# Patient Record
Sex: Female | Born: 1955 | Race: Black or African American | Hispanic: No | Marital: Married | Smoking: Never smoker
Health system: Southern US, Community
[De-identification: ages and names within clinical notes are randomized; demographics above are authoritative.]

## PROBLEM LIST (undated history)

## (undated) DIAGNOSIS — M1990 Unspecified osteoarthritis, unspecified site: Secondary | ICD-10-CM

## (undated) DIAGNOSIS — F329 Major depressive disorder, single episode, unspecified: Secondary | ICD-10-CM

## (undated) DIAGNOSIS — F32A Depression, unspecified: Secondary | ICD-10-CM

## (undated) DIAGNOSIS — L03115 Cellulitis of right lower limb: Secondary | ICD-10-CM

## (undated) DIAGNOSIS — Z8489 Family history of other specified conditions: Secondary | ICD-10-CM

## (undated) DIAGNOSIS — I10 Essential (primary) hypertension: Secondary | ICD-10-CM

## (undated) DIAGNOSIS — E7800 Pure hypercholesterolemia, unspecified: Secondary | ICD-10-CM

## (undated) DIAGNOSIS — F419 Anxiety disorder, unspecified: Secondary | ICD-10-CM

## (undated) HISTORY — PX: ABDOMINAL HYSTERECTOMY: SHX81

---
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
## 2003-09-16 ENCOUNTER — Other Ambulatory Visit: Payer: Self-pay

## 2005-01-14 ENCOUNTER — Emergency Department (HOSPITAL_COMMUNITY): Admission: EM | Admit: 2005-01-14 | Discharge: 2005-01-14 | Payer: Self-pay | Admitting: Emergency Medicine

## 2013-11-15 ENCOUNTER — Emergency Department: Payer: Self-pay | Admitting: Emergency Medicine

## 2015-05-12 ENCOUNTER — Telehealth: Payer: Self-pay | Admitting: Gastroenterology

## 2015-05-12 NOTE — Telephone Encounter (Signed)
colonoscopy

## 2015-06-06 ENCOUNTER — Other Ambulatory Visit: Payer: Self-pay

## 2015-06-06 ENCOUNTER — Telehealth: Payer: Self-pay

## 2015-06-06 NOTE — Telephone Encounter (Signed)
Gastroenterology Pre-Procedure Review  Request Date: 06/27/15 Requesting Physician: Dr. Georga Bora  PATIENT REVIEW QUESTIONS: The patient responded to the following health history questions as indicated:    1. Are you having any GI issues? no 2. Do you have a personal history of Polyps? no 3. Do you have a family history of Colon Cancer or Polyps? no 4. Diabetes Mellitus? no 5. Joint replacements in the past 12 months?no 6. Major health problems in the past 3 months?no 7. Any artificial heart valves, MVP, or defibrillator?no    MEDICATIONS & ALLERGIES:    Patient reports the following regarding taking any anticoagulation/antiplatelet therapy:   Plavix, Coumadin, Eliquis, Xarelto, Lovenox, Pradaxa, Brilinta, or Effient? no Aspirin? no  Patient confirms/reports the following medications:  Current Outpatient Prescriptions  Medication Sig Dispense Refill  . estradiol (ESTRACE) 1 MG tablet Take by mouth.    Marland Kitchen lisinopril-hydrochlorothiazide (PRINZIDE,ZESTORETIC) 20-12.5 MG tablet Take by mouth.    . lovastatin (MEVACOR) 40 MG tablet Take by mouth.    . medroxyPROGESTERone (PROVERA) 10 MG tablet Take by mouth.    . traZODone (DESYREL) 50 MG tablet Take by mouth.    . triamcinolone cream (KENALOG) 0.1 % Apply topically.     No current facility-administered medications for this visit.    Patient confirms/reports the following allergies:  No Known Allergies  No orders of the defined types were placed in this encounter.    AUTHORIZATION INFORMATION Primary Insurance: 1D#: Group #:  Secondary Insurance: 1D#: Group #:  SCHEDULE INFORMATION: Date: 06/27/15 Location: [place]

## 2015-06-06 NOTE — Telephone Encounter (Signed)
LVM for pt to return my call.

## 2015-06-06 NOTE — Telephone Encounter (Signed)
Pt scheduled for screening colonoscopy at [facility] on 06/27/15. Please precert

## 2015-06-21 ENCOUNTER — Emergency Department
Admission: EM | Admit: 2015-06-21 | Discharge: 2015-06-21 | Disposition: A | Discharge status: Home / Self Care | Payer: BLUE CROSS/BLUE SHIELD | Attending: Emergency Medicine | Admitting: Emergency Medicine

## 2015-06-21 DIAGNOSIS — Z79899 Other long term (current) drug therapy: Secondary | ICD-10-CM | POA: Diagnosis not present

## 2015-06-21 DIAGNOSIS — I10 Essential (primary) hypertension: Secondary | ICD-10-CM | POA: Diagnosis not present

## 2015-06-21 DIAGNOSIS — M1990 Unspecified osteoarthritis, unspecified site: Secondary | ICD-10-CM | POA: Insufficient documentation

## 2015-06-21 DIAGNOSIS — L03115 Cellulitis of right lower limb: Secondary | ICD-10-CM | POA: Diagnosis not present

## 2015-06-21 DIAGNOSIS — L02415 Cutaneous abscess of right lower limb: Secondary | ICD-10-CM | POA: Diagnosis present

## 2015-06-21 MED ORDER — AMOXICILLIN 500 MG PO TABS: 500.0000 mg | ORAL_TABLET | Freq: Three times a day (TID) | ORAL | Status: DC

## 2015-06-21 MED ORDER — SULFAMETHOXAZOLE-TRIMETHOPRIM 800-160 MG PO TABS
1.0000 | ORAL_TABLET | Freq: Once | ORAL | Status: AC
  Administered 2015-06-21: 1 via ORAL
  Filled 2015-06-21: qty 1

## 2015-06-21 MED ORDER — LIDOCAINE HCL (PF) 1 % IJ SOLN
INTRAMUSCULAR | Status: AC
  Filled 2015-06-21: qty 5

## 2015-06-21 MED ORDER — LIDOCAINE HCL (PF) 1 % IJ SOLN
2.1000 mL | Freq: Once | INTRAMUSCULAR | Status: AC
  Administered 2015-06-21: 2.1 mL

## 2015-06-21 MED ORDER — CEFTRIAXONE SODIUM 1 G IJ SOLR
1.0000 g | Freq: Once | INTRAMUSCULAR | Status: AC
  Administered 2015-06-21: 1 g via INTRAMUSCULAR
  Filled 2015-06-21: qty 10

## 2015-06-21 MED ORDER — SULFAMETHOXAZOLE-TRIMETHOPRIM 800-160 MG PO TABS: 1.0000 | ORAL_TABLET | Freq: Two times a day (BID) | ORAL | Status: DC

## 2015-06-21 NOTE — ED Provider Notes (Signed)
[facility] Emergency Department Provider Note  ____________________________________________  Time seen: Approximately 9:19 PM  I have reviewed the triage vital signs and the nursing notes.   HISTORY  Chief Complaint Abscess    HPI Kim Gomez is a 60 y.o. female who had a red spot on her right knee yesterday. Mild swelling last night. Worsening redness and swelling today with tenderness. No known injury. Had small amount of drainage yesterday. None today.   No past medical history on file.  There are no active problems to display for this patient.   No past surgical history on file.  Current Outpatient Rx  Name  Route  Sig  Dispense  Refill  . amoxicillin (AMOXIL) 500 MG tablet   Oral   Take 1 tablet (500 mg total) by mouth 3 (three) times daily.   21 tablet   0   . estradiol (ESTRACE) 1 MG tablet   Oral   Take by mouth.         Marland Kitchen lisinopril-hydrochlorothiazide (PRINZIDE,ZESTORETIC) 20-12.5 MG tablet   Oral   Take by mouth.         . lovastatin (MEVACOR) 40 MG tablet   Oral   Take by mouth.         . medroxyPROGESTERone (PROVERA) 10 MG tablet   Oral   Take by mouth.         . sulfamethoxazole-trimethoprim (BACTRIM DS,SEPTRA DS) 800-160 MG tablet   Oral   Take 1 tablet by mouth 2 (two) times daily.   14 tablet   0   . traZODone (DESYREL) 50 MG tablet   Oral   Take by mouth.         . triamcinolone cream (KENALOG) 0.1 %   Topical   Apply topically.           Allergies Review of patient's allergies indicates no known allergies.  No family history on file.  Social History Social History  Substance Use Topics  . Smoking status: Not on file  . Smokeless tobacco: Not on file  . Alcohol Use: Not on file    Review of Systems Constitutional: No fever/chills Eyes: No visual changes. ENT: No sore throat. Cardiovascular: Denies chest pain. Respiratory: Denies shortness of breath. Gastrointestinal: No  abdominal pain.  No nausea, no vomiting.  Musculoskeletal: Negative for back pain. Skin: Per history of present illness Neurological: Negative for headaches, focal weakness or numbness. 10-point ROS otherwise negative.  ____________________________________________   PHYSICAL EXAM:  VITAL SIGNS: ED Triage Vitals  Enc Vitals Group     BP 06/21/15 2048 142/62 mmHg     Pulse Rate 06/21/15 2048 84     Resp 06/21/15 2048 18     Temp 06/21/15 2048 98.4 F (36.9 C)     Temp Source 06/21/15 2048 Oral     SpO2 06/21/15 2048 98 %     Weight 06/21/15 2048 173 lb (78.472 kg)     Height 06/21/15 2048 5\' 4"  (1.626 m)     Head Cir --      Peak Flow --      Pain Score 06/21/15 2049 6     Pain Loc --      Pain Edu? --      Excl. in [place]? --     Constitutional: Alert and oriented. Well appearing and in no acute distress. Eyes: Conjunctivae are normal. PERRL. EOMI. Ears:  Clear with normal landmarks. No erythema. Head: Atraumatic. Nose: No congestion/rhinnorhea. Mouth/Throat: Mucous  membranes are moist.  Oropharynx non-erythematous. No lesions. Neck:  Supple.  No adenopathy.   Cardiovascular: Normal rate, regular rhythm. Grossly normal heart sounds.  Good peripheral circulation. Respiratory: Normal respiratory effort.  No retractions. Lungs CTAB. Gastrointestinal: Soft and nontender. No distention. No abdominal bruits. No CVA tenderness. Musculoskeletal: Nml ROM of upper and lower extremity joints. Neurologic:  Normal speech and language. No gross focal neurologic deficits are appreciated. No gait instability. Skin: Warm erythematous region around the right knee without focal induration, pointing or fluctuance. Psychiatric: Mood and affect are normal. Speech and behavior are normal.  ____________________________________________   LABS (all labs ordered are listed, but only abnormal results are displayed)  Labs Reviewed - No data to  display ____________________________________________  EKG   ____________________________________________  RADIOLOGY   ____________________________________________   PROCEDURES  Procedure(s) performed: None  Critical Care performed: No  ____________________________________________   INITIAL IMPRESSION / ASSESSMENT AND PLAN / ED COURSE  Pertinent labs & imaging results that were available during my care of the patient were reviewed by me and considered in my medical decision making (see chart for details).  60 year old with increasing redness, swelling, tenderness and warmth to the right knee. She has normal range of motion and symptoms are concerning for cellulitis. No focal abscess noted. She is given Rocephin 1 g IM to cover for MSSA, but also Bactrim 1 to cover for MRSA. She is sent home on amoxicillin and Bactrim and encouraged close follow-up, either here or her primary physician in the next 1-2 days. Discussed worsening symptoms for which she should return to the emergency room such as increasing redness, fevers and chills, increasing pain. ____________________________________________   FINAL CLINICAL IMPRESSION(S) / ED DIAGNOSES  Final diagnoses:  Cellulitis of right lower extremity      Mortimer Fries, PA-C 06/21/15 2123  Schuyler Amor, MD 06/21/15 2304

## 2015-06-21 NOTE — ED Notes (Signed)
Pt c/o abscess to right knee. Pt reports noticing painful red area last night, and pain/swelling has increased since. Area is warm to touch

## 2015-06-21 NOTE — Discharge Instructions (Signed)
Cellulitis Cellulitis is an infection of the skin and the tissue beneath it. The infected area is usually red and tender. Cellulitis occurs most often in the arms and lower legs.  CAUSES  Cellulitis is caused by bacteria that enter the skin through cracks or cuts in the skin. The most common types of bacteria that cause cellulitis are staphylococci and streptococci. SIGNS AND SYMPTOMS   Redness and warmth.  Swelling.  Tenderness or pain.  Fever. DIAGNOSIS  Your health care provider can usually determine what is wrong based on a physical exam. Blood tests may also be done. TREATMENT  Treatment usually involves taking an antibiotic medicine. HOME CARE INSTRUCTIONS   Take your antibiotic medicine as directed by your health care provider. Finish the antibiotic even if you start to feel better.  Keep the infected arm or leg elevated to reduce swelling.  Apply a warm cloth to the affected area up to 4 times per day to relieve pain.  Take medicines only as directed by your health care provider.  Keep all follow-up visits as directed by your health care provider. SEEK MEDICAL CARE IF:   You notice red streaks coming from the infected area.  Your red area gets larger or turns dark in color.  Your bone or joint underneath the infected area becomes painful after the skin has healed.  Your infection returns in the same area or another area.  You notice a swollen bump in the infected area.  You develop new symptoms.  You have a fever. SEEK IMMEDIATE MEDICAL CARE IF:   You feel very sleepy.  You develop vomiting or diarrhea.  You have a general ill feeling (malaise) with muscle aches and pains.   This information is not intended to replace advice given to you by your health care provider. Make sure you discuss any questions you have with your health care provider.   Document Released: 10/14/2004 Document Revised: 09/25/2014 Document Reviewed: 03/22/2011 Elsevier Interactive  Patient Education 2016 [employer].   Take antibiotics as directed. Follow-up with your physician in one to 2 days for further evaluation. Return to the emergency room for any worsening symptoms.

## 2015-06-21 NOTE — ED Notes (Signed)
E sig pad not working, pt verbalized understanding 

## 2015-06-21 NOTE — ED Notes (Signed)
Pt ambulatory to triage with no difficulty. Pt reports yesterday in the morning she noticed an area to her right knee that looked like a bite and was itching. Pt reports she scratched the area and noticed some purulent drainage from the area. Pt now has redness, warmth and swelling to her right knee. Pt denies pain to the joint. Pt denies injury.

## 2015-06-23 ENCOUNTER — Telehealth: Payer: Self-pay | Admitting: Gastroenterology

## 2015-06-23 ENCOUNTER — Other Ambulatory Visit: Payer: Self-pay

## 2015-06-23 ENCOUNTER — Encounter: Payer: Self-pay | Admitting: *Deleted

## 2015-06-23 DIAGNOSIS — Z1211 Encounter for screening for malignant neoplasm of colon: Secondary | ICD-10-CM

## 2015-06-23 MED ORDER — PEG 3350-KCL-NABCB-NACL-NASULF 236 G PO SOLR: 4000.0000 mL | Freq: Once | ORAL | Status: DC

## 2015-06-23 NOTE — Telephone Encounter (Signed)
Pt had not received instructions that I had mailed to her. Rx was sent to [place], [employer] and paperwork was emailed.

## 2015-06-23 NOTE — Telephone Encounter (Signed)
Patient called and stated that her colonoscopy is Friday and she didn't get her RX for her prep yet. Please call.

## 2015-06-24 NOTE — Telephone Encounter (Signed)
All pt's questions answered regarding prep instructions.

## 2015-06-24 NOTE — Discharge Instructions (Signed)

## 2015-06-27 ENCOUNTER — Ambulatory Visit: Payer: BLUE CROSS/BLUE SHIELD | Admitting: Anesthesiology

## 2015-06-27 ENCOUNTER — Encounter
Admission: RE | Disposition: A | Discharge status: Home / Self Care | Payer: Self-pay | Source: Ambulatory Visit | Attending: Gastroenterology

## 2015-06-27 ENCOUNTER — Ambulatory Visit
Admission: RE | Admit: 2015-06-27 | Discharge: 2015-06-27 | Disposition: A | Discharge status: Home / Self Care | Payer: BLUE CROSS/BLUE SHIELD | Source: Ambulatory Visit | Attending: Gastroenterology | Admitting: Gastroenterology

## 2015-06-27 DIAGNOSIS — D122 Benign neoplasm of ascending colon: Secondary | ICD-10-CM

## 2015-06-27 DIAGNOSIS — K5730 Diverticulosis of large intestine without perforation or abscess without bleeding: Secondary | ICD-10-CM | POA: Diagnosis not present

## 2015-06-27 DIAGNOSIS — I10 Essential (primary) hypertension: Secondary | ICD-10-CM | POA: Insufficient documentation

## 2015-06-27 DIAGNOSIS — E7800 Pure hypercholesterolemia, unspecified: Secondary | ICD-10-CM | POA: Diagnosis not present

## 2015-06-27 DIAGNOSIS — Z79899 Other long term (current) drug therapy: Secondary | ICD-10-CM | POA: Insufficient documentation

## 2015-06-27 DIAGNOSIS — D124 Benign neoplasm of descending colon: Secondary | ICD-10-CM | POA: Insufficient documentation

## 2015-06-27 DIAGNOSIS — Z1211 Encounter for screening for malignant neoplasm of colon: Secondary | ICD-10-CM | POA: Insufficient documentation

## 2015-06-27 DIAGNOSIS — M19042 Primary osteoarthritis, left hand: Secondary | ICD-10-CM | POA: Diagnosis not present

## 2015-06-27 DIAGNOSIS — M19041 Primary osteoarthritis, right hand: Secondary | ICD-10-CM | POA: Insufficient documentation

## 2015-06-27 DIAGNOSIS — M170 Bilateral primary osteoarthritis of knee: Secondary | ICD-10-CM | POA: Diagnosis not present

## 2015-06-27 DIAGNOSIS — K641 Second degree hemorrhoids: Secondary | ICD-10-CM | POA: Diagnosis not present

## 2015-06-27 HISTORY — DX: Essential (primary) hypertension: I10

## 2015-06-27 HISTORY — DX: Pure hypercholesterolemia, unspecified: E78.00

## 2015-06-27 HISTORY — PX: COLONOSCOPY WITH PROPOFOL: SHX5780

## 2015-06-27 HISTORY — PX: POLYPECTOMY: SHX5525

## 2015-06-27 HISTORY — DX: Family history of other specified conditions: Z84.89

## 2015-06-27 HISTORY — DX: Cellulitis of right lower limb: L03.115

## 2015-06-27 HISTORY — DX: Unspecified osteoarthritis, unspecified site: M19.90

## 2015-06-27 SURGERY — COLONOSCOPY WITH PROPOFOL
Anesthesia: Monitor Anesthesia Care

## 2015-06-27 MED ORDER — PROPOFOL 10 MG/ML IV BOLUS
INTRAVENOUS | Status: DC | PRN
  Administered 2015-06-27: 40 mg via INTRAVENOUS
  Administered 2015-06-27: 20 mg via INTRAVENOUS
  Administered 2015-06-27: 40 mg via INTRAVENOUS
  Administered 2015-06-27: 100 mg via INTRAVENOUS

## 2015-06-27 MED ORDER — LACTATED RINGERS IV SOLN
INTRAVENOUS | Status: DC
  Administered 2015-06-27: 09:00:00 via INTRAVENOUS

## 2015-06-27 MED ORDER — STERILE WATER FOR IRRIGATION IR SOLN
Status: DC | PRN
  Administered 2015-06-27: 10:00:00

## 2015-06-27 MED ORDER — LIDOCAINE HCL (CARDIAC) 20 MG/ML IV SOLN
INTRAVENOUS | Status: DC | PRN
  Administered 2015-06-27: 40 mg via INTRAVENOUS

## 2015-06-27 SURGICAL SUPPLY — 22 items
CANISTER SUCT 1200ML W/VALVE (MISCELLANEOUS) ×3 IMPLANT
CLIP HMST 235XBRD CATH ROT (MISCELLANEOUS) IMPLANT
CLIP RESOLUTION 360 11X235 (MISCELLANEOUS)
FCP ESCP3.2XJMB 240X2.8X (MISCELLANEOUS)
FORCEPS BIOP RAD 4 LRG CAP 4 (CUTTING FORCEPS) IMPLANT
FORCEPS BIOP RJ4 240 W/NDL (MISCELLANEOUS)
FORCEPS ESCP3.2XJMB 240X2.8X (MISCELLANEOUS) IMPLANT
GOWN CVR UNV OPN BCK APRN NK (MISCELLANEOUS) ×4 IMPLANT
GOWN ISOL THUMB LOOP REG UNIV (MISCELLANEOUS) ×6
INJECTOR VARIJECT VIN23 (MISCELLANEOUS) IMPLANT
KIT DEFENDO VALVE AND CONN (KITS) IMPLANT
KIT ENDO PROCEDURE OLY (KITS) ×3 IMPLANT
MARKER SPOT ENDO TATTOO 5ML (MISCELLANEOUS) IMPLANT
PAD GROUND ADULT SPLIT (MISCELLANEOUS) IMPLANT
PROBE APC STR FIRE (PROBE) IMPLANT
SNARE SHORT THROW 13M SML OVAL (MISCELLANEOUS) ×1 IMPLANT
SNARE SHORT THROW 30M LRG OVAL (MISCELLANEOUS) IMPLANT
SNARE SNG USE RND 15MM (INSTRUMENTS) IMPLANT
SPOT EX ENDOSCOPIC TATTOO (MISCELLANEOUS)
TRAP ETRAP POLY (MISCELLANEOUS) ×1 IMPLANT
VARIJECT INJECTOR VIN23 (MISCELLANEOUS)
WATER STERILE IRR 250ML POUR (IV SOLUTION) ×3 IMPLANT

## 2015-06-27 NOTE — Transfer of Care (Signed)
Immediate Anesthesia Transfer of Care Note  Patient: Kim Gomez  Procedure(s) Performed: Procedure(s): COLONOSCOPY WITH PROPOFOL (N/A) POLYPECTOMY  Patient Location: PACU  Anesthesia Type: MAC  Level of Consciousness: awake, alert  and patient cooperative  Airway and Oxygen Therapy: Patient Spontanous Breathing and Patient connected to supplemental oxygen  Post-op Assessment: Post-op Vital signs reviewed, Patient's Cardiovascular Status Stable, Respiratory Function Stable, Patent Airway and No signs of Nausea or vomiting  Post-op Vital Signs: Reviewed and stable  Complications: No apparent anesthesia complications

## 2015-06-27 NOTE — Anesthesia Procedure Notes (Signed)
Procedure Name: MAC Performed by: Loretta Kluender Pre-anesthesia Checklist: Patient identified, Emergency Drugs available, Suction available, Patient being monitored and Timeout performed Patient Re-evaluated:Patient Re-evaluated prior to inductionOxygen Delivery Method: Nasal cannula       

## 2015-06-27 NOTE — Anesthesia Preprocedure Evaluation (Addendum)
Anesthesia Evaluation  Patient identified by MRN, date of birth, ID band  Reviewed: Allergy & Precautions, H&P , NPO status , Patient's Chart, lab work & pertinent test results  History of Anesthesia Complications Negative for: history of anesthetic complications  Airway Mallampati: II  TM Distance: >3 FB Neck ROM: full    Dental no notable dental hx.    Pulmonary neg pulmonary ROS,    Pulmonary exam normal        Cardiovascular hypertension, On Medications Normal cardiovascular exam     Neuro/Psych    GI/Hepatic negative GI ROS, Neg liver ROS,   Endo/Other  negative endocrine ROS  Renal/GU negative Renal ROS     Musculoskeletal   Abdominal   Peds  Hematology negative hematology ROS (+)   Anesthesia Other Findings   Reproductive/Obstetrics                            Anesthesia Physical Anesthesia Plan  ASA: II  Anesthesia Plan: MAC   Post-op Pain Management:    Induction:   Airway Management Planned:   Additional Equipment:   Intra-op Plan:   Post-operative Plan:   Informed Consent: I have reviewed the patients History and Physical, chart, labs and discussed the procedure including the risks, benefits and alternatives for the proposed anesthesia with the patient or authorized representative who has indicated his/her understanding and acceptance.     Plan Discussed with: CRNA  Anesthesia Plan Comments:         Anesthesia Quick Evaluation

## 2015-06-27 NOTE — Anesthesia Postprocedure Evaluation (Signed)
Anesthesia Post Note  Patient: Kim Gomez  Procedure(s) Performed: Procedure(s) (LRB): COLONOSCOPY WITH PROPOFOL (N/A) POLYPECTOMY  Patient location during evaluation: PACU Anesthesia Type: MAC Level of consciousness: awake and alert Pain management: pain level controlled Vital Signs Assessment: post-procedure vital signs reviewed and stable Respiratory status: spontaneous breathing and respiratory function stable Cardiovascular status: stable Postop Assessment: no headache Anesthetic complications: no    Jaci Standard, III,  Elisa Kutner D

## 2015-06-27 NOTE — Op Note (Signed)
[facility] Gastroenterology Patient Name: Kim Gomez Procedure Date: 06/27/2015 9:53 AM MRN: [id number] Account #: [id number] Date of Birth: [birth year] Admit Type: Outpatient Age: 60 Room: [facility] OR ROOM 01 Gender: Female Note Status: Finalized Procedure:            Colonoscopy Indications:          Screening for colorectal malignant neoplasm Providers:            Lucilla Lame, MD Referring MD:         Shelby Mattocks. Georga Bora, MD (Referring MD) Medicines:            Propofol per Anesthesia Complications:        No immediate complications. Procedure:            Pre-Anesthesia Assessment:                       - Prior to the procedure, a History and Physical was                        performed, and patient medications and allergies were                        reviewed. The patient's tolerance of previous                        anesthesia was also reviewed. The risks and benefits of                        the procedure and the sedation options and risks were                        discussed with the patient. All questions were                        answered, and informed consent was obtained. Prior                        Anticoagulants: The patient has taken no previous                        anticoagulant or antiplatelet agents. ASA Grade                        Assessment: II - A patient with mild systemic disease.                        After reviewing the risks and benefits, the patient was                        deemed in satisfactory condition to undergo the                        procedure.                       After obtaining informed consent, the colonoscope was                        passed under direct vision. Throughout the procedure,  the patient's blood pressure, pulse, and oxygen                        saturations were monitored continuously. The Olympus CF                        H180AL colonoscope (S#: [id number]) was introduced  through                        the anus and advanced to the the cecum, identified by                        appendiceal orifice and ileocecal valve. The                        colonoscopy was performed without difficulty. The                        patient tolerated the procedure well. The quality of                        the bowel preparation was good. Findings:      The perianal and digital rectal examinations were normal.      Two sessile polyps were found in the ascending colon. The polyps were 3       to 4 mm in size. These polyps were removed with a cold snare. Resection       and retrieval were complete.      A 3 mm polyp was found in the descending colon. The polyp was sessile.       The polyp was removed with a cold snare. Resection and retrieval were       complete.      Multiple small-mouthed diverticula were found in the entire colon.      Non-bleeding internal hemorrhoids were found during retroflexion. The       hemorrhoids were Grade II (internal hemorrhoids that prolapse but reduce       spontaneously). Impression:           - Two 3 to 4 mm polyps in the ascending colon, removed                        with a cold snare. Resected and retrieved.                       - One 3 mm polyp in the descending colon, removed with                        a cold snare. Resected and retrieved.                       - Diverticulosis in the entire examined colon.                       - Non-bleeding internal hemorrhoids. Recommendation:       - Repeat colonoscopy in 5 years if polyp adenoma and 10                        years if hyperplastic Procedure Code(s):    --- Professional ---  45385, Colonoscopy, flexible; with removal of tumor(s),                        polyp(s), or other lesion(s) by snare technique Diagnosis Code(s):    --- Professional ---                       Z12.11, Encounter for screening for malignant neoplasm                        of colon                        D12.2, Benign neoplasm of ascending colon                       D12.4, Benign neoplasm of descending colon CPT copyright 2016 American Medical Association. All rights reserved. The codes documented in this report are preliminary and upon coder review may  be revised to meet current compliance requirements. Lucilla Lame, MD 06/27/2015 10:09:45 AM This report has been signed electronically. Number of Addenda: 0 Note Initiated On: 06/27/2015 9:53 AM Scope Withdrawal Time: 0 hours 7 minutes 36 seconds  Total Procedure Duration: 0 hours 11 minutes 26 seconds       [facility]

## 2015-06-27 NOTE — H&P (Signed)
Kim Lame, MD [facility] [address] [place], [place] [address] Phone: [phone] Fax : [phone]  Primary Care Physician:  No primary care provider on file. Primary Gastroenterologist:  Dr. Allen Norris  Pre-Procedure History & Physical: HPI:  Kim Gomez is a 60 y.o. female is here for a screening colonoscopy.   Past Medical History  Diagnosis Date  . Arthritis     legs, hands  . Hypertension   . Family history of adverse reaction to anesthesia     sister and mom -PONV  . Hypercholesteremia   . Cellulitis of right knee     started on antibiotics 06/21/15.      Past Surgical History  Procedure Laterality Date  . Abdominal hysterectomy      Prior to Admission medications   Medication Sig Start Date End Date Taking? Authorizing Provider  amoxicillin (AMOXIL) 500 MG tablet Take 1 tablet (500 mg total) by mouth 3 (three) times daily. 06/21/15  Yes Mortimer Fries, PA-C  Calcium Carb-Cholecalciferol (CALCIUM 500 +D PO) Take by mouth daily.   Yes Historical Provider, MD  estradiol (ESTRACE) 1 MG tablet Take by mouth.   Yes Historical Provider, MD  lisinopril-hydrochlorothiazide (PRINZIDE,ZESTORETIC) 20-12.5 MG tablet Take by mouth.   Yes Historical Provider, MD  lovastatin (MEVACOR) 40 MG tablet Take by mouth.   Yes Historical Provider, MD  medroxyPROGESTERone (PROVERA) 10 MG tablet Take by mouth.   Yes Historical Provider, MD  Prenatal Vit-Fe Fumarate-FA (PRENATAL VITAMIN PLUS LOW IRON PO) Take by mouth daily.   Yes Historical Provider, MD  sulfamethoxazole-trimethoprim (BACTRIM DS,SEPTRA DS) 800-160 MG tablet Take 1 tablet by mouth 2 (two) times daily. 06/21/15  Yes Mortimer Fries, PA-C  traZODone (DESYREL) 50 MG tablet Take by mouth.   Yes Historical Provider, MD  triamcinolone cream (KENALOG) 0.1 % Apply topically. 05/21/15  Yes Historical Provider, MD  polyethylene glycol (GOLYTELY) 236 g solution Take 4,000 mLs by mouth once. Drink one 8 oz glass every 20 min until stools are  clear. 06/23/15   Kim Lame, MD    Allergies as of 06/06/2015  . (No Known Allergies)    History reviewed. No pertinent family history.  Social History   Social History  . Marital Status: Married    Spouse Name: N/A  . Number of Children: N/A  . Years of Education: N/A   Occupational History  . Not on file.   Social History Main Topics  . Smoking status: Never Smoker   . Smokeless tobacco: Not on file  . Alcohol Use: No  . Drug Use: Not on file  . Sexual Activity: Not on file   Other Topics Concern  . Not on file   Social History Narrative    Review of Systems: See HPI, otherwise negative ROS  Physical Exam: BP 148/80 mmHg  Pulse 84  Temp(Src) 98.4 F (36.9 C) (Temporal)  Resp 16  Ht 5\' 4"  (1.626 m)  Wt 162 lb (73.483 kg)  BMI 27.79 kg/m2  SpO2 100% General:   Alert,  pleasant and cooperative in NAD Head:  Normocephalic and atraumatic. Neck:  Supple; no masses or thyromegaly. Lungs:  Clear throughout to auscultation.    Heart:  Regular rate and rhythm. Abdomen:  Soft, nontender and nondistended. Normal bowel sounds, without guarding, and without rebound.   Neurologic:  Alert and  oriented x4;  grossly normal neurologically.  Impression/Plan: Kim Gomez is now here to undergo a screening colonoscopy.  Risks, benefits, and alternatives regarding colonoscopy have been  reviewed with the patient.  Questions have been answered.  All parties agreeable.

## 2015-06-30 ENCOUNTER — Encounter: Payer: Self-pay | Admitting: Gastroenterology

## 2015-07-01 ENCOUNTER — Encounter: Payer: Self-pay | Admitting: Gastroenterology

## 2015-09-15 ENCOUNTER — Emergency Department
Admission: EM | Admit: 2015-09-15 | Discharge: 2015-09-15 | Disposition: A | Discharge status: Home / Self Care | Payer: BLUE CROSS/BLUE SHIELD | Attending: Emergency Medicine | Admitting: Emergency Medicine

## 2015-09-15 ENCOUNTER — Encounter: Payer: Self-pay | Admitting: Emergency Medicine

## 2015-09-15 DIAGNOSIS — S6981XA Other specified injuries of right wrist, hand and finger(s), initial encounter: Secondary | ICD-10-CM

## 2015-09-15 DIAGNOSIS — X509XXA Other and unspecified overexertion or strenuous movements or postures, initial encounter: Secondary | ICD-10-CM | POA: Diagnosis not present

## 2015-09-15 DIAGNOSIS — Z79899 Other long term (current) drug therapy: Secondary | ICD-10-CM | POA: Diagnosis not present

## 2015-09-15 DIAGNOSIS — Y9389 Activity, other specified: Secondary | ICD-10-CM | POA: Insufficient documentation

## 2015-09-15 DIAGNOSIS — I10 Essential (primary) hypertension: Secondary | ICD-10-CM | POA: Diagnosis not present

## 2015-09-15 DIAGNOSIS — Y999 Unspecified external cause status: Secondary | ICD-10-CM | POA: Insufficient documentation

## 2015-09-15 DIAGNOSIS — S60419A Abrasion of unspecified finger, initial encounter: Secondary | ICD-10-CM

## 2015-09-15 DIAGNOSIS — S60412A Abrasion of right middle finger, initial encounter: Secondary | ICD-10-CM | POA: Insufficient documentation

## 2015-09-15 DIAGNOSIS — Y929 Unspecified place or not applicable: Secondary | ICD-10-CM | POA: Insufficient documentation

## 2015-09-15 DIAGNOSIS — L089 Local infection of the skin and subcutaneous tissue, unspecified: Secondary | ICD-10-CM | POA: Insufficient documentation

## 2015-09-15 DIAGNOSIS — S61204A Unspecified open wound of right ring finger without damage to nail, initial encounter: Secondary | ICD-10-CM | POA: Diagnosis not present

## 2015-09-15 DIAGNOSIS — S6991XA Unspecified injury of right wrist, hand and finger(s), initial encounter: Secondary | ICD-10-CM | POA: Diagnosis present

## 2015-09-15 MED ORDER — IBUPROFEN 600 MG PO TABS: 600.0000 mg | ORAL_TABLET | Freq: Three times a day (TID) | ORAL | 0 refills | Status: DC | PRN

## 2015-09-15 MED ORDER — TRAMADOL HCL 50 MG PO TABS: 50.0000 mg | ORAL_TABLET | Freq: Four times a day (QID) | ORAL | 0 refills | Status: AC | PRN

## 2015-09-15 MED ORDER — SULFAMETHOXAZOLE-TRIMETHOPRIM 800-160 MG PO TABS
1.0000 | ORAL_TABLET | Freq: Once | ORAL | Status: AC
  Administered 2015-09-15: 1 via ORAL
  Filled 2015-09-15: qty 1

## 2015-09-15 MED ORDER — SULFAMETHOXAZOLE-TRIMETHOPRIM 800-160 MG PO TABS: 1.0000 | ORAL_TABLET | Freq: Two times a day (BID) | ORAL | 0 refills | Status: DC

## 2015-09-15 NOTE — ED Provider Notes (Signed)
[facility] Emergency Department Provider Note   ____________________________________________   None    (approximate)  I have reviewed the triage vital signs and the nursing notes.   HISTORY  Chief Complaint Hand Pain    HPI Kim Gomez is a 60 y.o. female patient presented with 3 edematous erythematous discharge from the proximal phalange of the third digit right hand. Patient has a rein that is deeply embedded into the skin. Patient states she noticed the edema last night secondary to a papular lesion. Patient states she noticed a discharge from underneath the ring this morning. Patient rates the pain as a 9/10. Patient described a pain as "achy". No palliative measures prior to arrival.  Past Medical History:  Diagnosis Date  . Arthritis    legs, hands  . Cellulitis of right knee    started on antibiotics 06/21/15.    . Family history of adverse reaction to anesthesia    sister and mom -PONV  . Hypercholesteremia   . Hypertension     Patient Active Problem List   Diagnosis Date Noted  . Special screening for malignant neoplasms, colon   . Benign neoplasm of ascending colon   . Benign neoplasm of descending colon     Past Surgical History:  Procedure Laterality Date  . ABDOMINAL HYSTERECTOMY    . COLONOSCOPY WITH PROPOFOL N/A 06/27/2015   Procedure: COLONOSCOPY WITH PROPOFOL;  Surgeon: Lucilla Lame, MD;  Location: [place];  Service: Endoscopy;  Laterality: N/A;  . POLYPECTOMY  06/27/2015   Procedure: POLYPECTOMY;  Surgeon: Lucilla Lame, MD;  Location: [place];  Service: Endoscopy;;    Prior to Admission medications   Medication Sig Start Date End Date Taking? Authorizing Provider  amoxicillin (AMOXIL) 500 MG tablet Take 1 tablet (500 mg total) by mouth 3 (three) times daily. 06/21/15   Mortimer Fries, PA-C  Calcium Carb-Cholecalciferol (CALCIUM 500 +D PO) Take by mouth daily.    Historical Provider, MD  estradiol  (ESTRACE) 1 MG tablet Take by mouth.    Historical Provider, MD  ibuprofen (ADVIL,MOTRIN) 600 MG tablet Take 1 tablet (600 mg total) by mouth every 8 (eight) hours as needed. 09/15/15   Sable Feil, PA-C  lisinopril-hydrochlorothiazide (PRINZIDE,ZESTORETIC) 20-12.5 MG tablet Take by mouth.    Historical Provider, MD  lovastatin (MEVACOR) 40 MG tablet Take by mouth.    Historical Provider, MD  medroxyPROGESTERone (PROVERA) 10 MG tablet Take by mouth.    Historical Provider, MD  polyethylene glycol (GOLYTELY) 236 g solution Take 4,000 mLs by mouth once. Drink one 8 oz glass every 20 min until stools are clear. 06/23/15   Lucilla Lame, MD  Prenatal Vit-Fe Fumarate-FA (PRENATAL VITAMIN PLUS LOW IRON PO) Take by mouth daily.    Historical Provider, MD  sulfamethoxazole-trimethoprim (BACTRIM DS,SEPTRA DS) 800-160 MG tablet Take 1 tablet by mouth 2 (two) times daily. 06/21/15   Mortimer Fries, PA-C  sulfamethoxazole-trimethoprim (BACTRIM DS,SEPTRA DS) 800-160 MG tablet Take 1 tablet by mouth 2 (two) times daily. 09/15/15   Sable Feil, PA-C  traMADol (ULTRAM) 50 MG tablet Take 1 tablet (50 mg total) by mouth every 6 (six) hours as needed. 09/15/15 09/14/16  Sable Feil, PA-C  traZODone (DESYREL) 50 MG tablet Take by mouth.    Historical Provider, MD  triamcinolone cream (KENALOG) 0.1 % Apply topically. 05/21/15   Historical Provider, MD    Allergies Review of patient's allergies indicates no known allergies.  No family history on file.  Social History Social History  Substance Use Topics  . Smoking status: Never Smoker  . Smokeless tobacco: Never Used  . Alcohol use No    Review of Systems Constitutional: No fever/chills Eyes: No visual changes. ENT: No sore throat. Cardiovascular: Denies chest pain. Respiratory: Denies shortness of breath. Gastrointestinal: No abdominal pain.  No nausea, no vomiting.  No diarrhea.  No constipation. Genitourinary: Negative for dysuria. Musculoskeletal:  Negative for back pain. Skin: Negative for rash. Neurological: Negative for headaches, focal weakness or numbness. Endocrine:Hyperlipidemia and hypertension. ____________________________________________   PHYSICAL EXAM:  VITAL SIGNS: ED Triage Vitals  Enc Vitals Group     BP 09/15/15 1209 (!) 163/84     Pulse Rate 09/15/15 1209 68     Resp 09/15/15 1209 (!) 22     Temp 09/15/15 1209 98.8 F (37.1 C)     Temp Source 09/15/15 1209 Oral     SpO2 09/15/15 1209 100 %     Weight 09/15/15 1211 173 lb (78.5 kg)     Height 09/15/15 1211 5\' 4"  (1.626 m)     Head Circumference --      Peak Flow --      Pain Score 09/15/15 1214 9     Pain Loc --      Pain Edu? --      Excl. in [place]? --     Constitutional: Alert and oriented. Well appearing and in no acute distress. Eyes: Conjunctivae are normal. PERRL. EOMI. Head: Atraumatic. Nose: No congestion/rhinnorhea. Mouth/Throat: Mucous membranes are moist.  Oropharynx non-erythematous. Neck: No stridor.  No cervical spine tenderness to palpation. Hematological/Lymphatic/Immunilogical: No cervical lymphadenopathy. Cardiovascular: Normal rate, regular rhythm. Grossly normal heart sounds.  Good peripheral circulation. Respiratory: Normal respiratory effort.  No retractions. Lungs CTAB. Gastrointestinal: Soft and nontender. No distention. No abdominal bruits. No CVA tenderness. Musculoskeletal: No lower extremity tenderness nor edema.  No joint effusions. Neurologic:  Normal speech and language. No gross focal neurologic deficits are appreciated. No gait instability. Skin:  Skin is warm, dry and intact. No rash noted.Edema and erythema proximal phalange third digit right hand. School ring is embedded into finger. Psychiatric: Mood and affect are normal. Speech and behavior are normal.  ____________________________________________   LABS (all labs ordered are listed, but only abnormal results are displayed)  Labs Reviewed - No data to  display ____________________________________________  EKG   ____________________________________________  RADIOLOGY   ____________________________________________   PROCEDURES  Procedure(s) performed: None  Procedures  Critical Care performed: No  ____________________________________________   INITIAL IMPRESSION / ASSESSMENT AND PLAN / ED COURSE  Pertinent labs & imaging results that were available during my care of the patient were reviewed by me and considered in my medical decision making (see chart for details).  Skin infection secondary to abrasion of the third digit right hand. Patient given discharge care instructions. Patient was placed in a finger splint. Patient given a prescription for Bactrim DS, tramadol, and ibuprofen. Patient advised to follow-up in 2-3 days I discussed Department or PCP.  Clinical Course   Patient given a digital block to remove tight ring from finger. Area is then given Betadine wash, bandaged and splinted.  ____________________________________________   FINAL CLINICAL IMPRESSION(S) / ED DIAGNOSES  Final diagnoses:  Abrasion of finger with infection, initial encounter  Avulsion, ring, right, initial encounter      NEW MEDICATIONS STARTED DURING THIS VISIT:  New Prescriptions   IBUPROFEN (ADVIL,MOTRIN) 600 MG TABLET    Take 1 tablet (600 mg total) by  mouth every 8 (eight) hours as needed.   SULFAMETHOXAZOLE-TRIMETHOPRIM (BACTRIM DS,SEPTRA DS) 800-160 MG TABLET    Take 1 tablet by mouth 2 (two) times daily.   TRAMADOL (ULTRAM) 50 MG TABLET    Take 1 tablet (50 mg total) by mouth every 6 (six) hours as needed.     Note:  This document was prepared using Dragon voice recognition software and may include unintentional dictation errors.    Sable Feil, PA-C 09/15/15 [place] Yao, MD 09/15/15 [phone]

## 2015-09-15 NOTE — Discharge Instructions (Signed)
Take antibiotics as directed. Wear splint for 2-3 days as needed. Daily wound care as directed.

## 2015-09-15 NOTE — ED Triage Notes (Signed)
Pt comes into the ED via POV c/o left hand pain on the 3rd digit.  Patient presents with swelling and unable to get her ring off.  Patient has caused a skin tear at the site of the ring.  Informed me that the swelling began last night around a "bump" that appeared.  Patient in NAD at this time with even and unlabored respirations.

## 2015-09-17 ENCOUNTER — Encounter: Payer: Self-pay | Admitting: Emergency Medicine

## 2015-09-17 ENCOUNTER — Emergency Department
Admission: EM | Admit: 2015-09-17 | Discharge: 2015-09-17 | Disposition: A | Discharge status: Home / Self Care | Payer: BLUE CROSS/BLUE SHIELD | Attending: Emergency Medicine | Admitting: Emergency Medicine

## 2015-09-17 DIAGNOSIS — I10 Essential (primary) hypertension: Secondary | ICD-10-CM | POA: Diagnosis not present

## 2015-09-17 DIAGNOSIS — Z791 Long term (current) use of non-steroidal anti-inflammatories (NSAID): Secondary | ICD-10-CM | POA: Insufficient documentation

## 2015-09-17 DIAGNOSIS — Z4800 Encounter for change or removal of nonsurgical wound dressing: Secondary | ICD-10-CM | POA: Diagnosis not present

## 2015-09-17 DIAGNOSIS — Z79899 Other long term (current) drug therapy: Secondary | ICD-10-CM | POA: Insufficient documentation

## 2015-09-17 DIAGNOSIS — Z5189 Encounter for other specified aftercare: Secondary | ICD-10-CM

## 2015-09-17 NOTE — ED Provider Notes (Signed)
[facility] Emergency Department Provider Note   ____________________________________________   None    (approximate)  I have reviewed the triage vital signs and the nursing notes.   HISTORY  Chief Complaint Wound Check    HPI Kim Gomez is a 60 y.o. female patient here for recheck third digit left hand secondary to infection. Patient had an insect bite to the digit resulting in edema and erythema and requiring her ring to be cut off. Patient state reduced edema todaybut still unable to flex the finger secondary to the swelling. Patient denies any loss of sensation. Patient currently rates the pain as a 4/10. Patient describes the pain as "achy".   Past Medical History:  Diagnosis Date  . Arthritis    legs, hands  . Cellulitis of right knee    started on antibiotics 06/21/15.    . Family history of adverse reaction to anesthesia    sister and mom -PONV  . Hypercholesteremia   . Hypertension     Patient Active Problem List   Diagnosis Date Noted  . Special screening for malignant neoplasms, colon   . Benign neoplasm of ascending colon   . Benign neoplasm of descending colon     Past Surgical History:  Procedure Laterality Date  . ABDOMINAL HYSTERECTOMY    . COLONOSCOPY WITH PROPOFOL N/A 06/27/2015   Procedure: COLONOSCOPY WITH PROPOFOL;  Surgeon: Lucilla Lame, MD;  Location: [place];  Service: Endoscopy;  Laterality: N/A;  . POLYPECTOMY  06/27/2015   Procedure: POLYPECTOMY;  Surgeon: Lucilla Lame, MD;  Location: [place];  Service: Endoscopy;;    Prior to Admission medications   Medication Sig Start Date End Date Taking? Authorizing Provider  amoxicillin (AMOXIL) 500 MG tablet Take 1 tablet (500 mg total) by mouth 3 (three) times daily. 06/21/15   Mortimer Fries, PA-C  Calcium Carb-Cholecalciferol (CALCIUM 500 +D PO) Take by mouth daily.    Historical Provider, MD  estradiol (ESTRACE) 1 MG tablet Take by mouth.     Historical Provider, MD  ibuprofen (ADVIL,MOTRIN) 600 MG tablet Take 1 tablet (600 mg total) by mouth every 8 (eight) hours as needed. 09/15/15   Sable Feil, PA-C  lisinopril-hydrochlorothiazide (PRINZIDE,ZESTORETIC) 20-12.5 MG tablet Take by mouth.    Historical Provider, MD  lovastatin (MEVACOR) 40 MG tablet Take by mouth.    Historical Provider, MD  medroxyPROGESTERone (PROVERA) 10 MG tablet Take by mouth.    Historical Provider, MD  polyethylene glycol (GOLYTELY) 236 g solution Take 4,000 mLs by mouth once. Drink one 8 oz glass every 20 min until stools are clear. 06/23/15   Lucilla Lame, MD  Prenatal Vit-Fe Fumarate-FA (PRENATAL VITAMIN PLUS LOW IRON PO) Take by mouth daily.    Historical Provider, MD  sulfamethoxazole-trimethoprim (BACTRIM DS,SEPTRA DS) 800-160 MG tablet Take 1 tablet by mouth 2 (two) times daily. 06/21/15   Mortimer Fries, PA-C  sulfamethoxazole-trimethoprim (BACTRIM DS,SEPTRA DS) 800-160 MG tablet Take 1 tablet by mouth 2 (two) times daily. 09/15/15   Sable Feil, PA-C  traMADol (ULTRAM) 50 MG tablet Take 1 tablet (50 mg total) by mouth every 6 (six) hours as needed. 09/15/15 09/14/16  Sable Feil, PA-C  traZODone (DESYREL) 50 MG tablet Take by mouth.    Historical Provider, MD  triamcinolone cream (KENALOG) 0.1 % Apply topically. 05/21/15   Historical Provider, MD    Allergies Review of patient's allergies indicates no known allergies.  No family history on file.  Social History Social  History  Substance Use Topics  . Smoking status: Never Smoker  . Smokeless tobacco: Never Used  . Alcohol use No    Review of Systems Constitutional: No fever/chills Eyes: No visual changes. ENT: No sore throat. Cardiovascular: Denies chest pain. Respiratory: Denies shortness of breath. Gastrointestinal: No abdominal pain.  No nausea, no vomiting.  No diarrhea.  No constipation. Genitourinary: Negative for dysuria. Musculoskeletal: Negative for back pain. Skin: Negative for  rash. Swelling and redness third digit right hand. Neurological: Negative for headaches, focal weakness or numbness.    ____________________________________________   PHYSICAL EXAM:  VITAL SIGNS: ED Triage Vitals [09/17/15 0941]  Enc Vitals Group     BP 121/63     Pulse Rate 72     Resp 14     Temp 98.6 F (37 C)     Temp Source Oral     SpO2 98 %     Weight 173 lb (78.5 kg)     Height 5\' 4"  (1.626 m)     Head Circumference      Peak Flow      Pain Score      Pain Loc      Pain Edu?      Excl. in [place]?     Constitutional: Alert and oriented. Well appearing and in no acute distress. Eyes: Conjunctivae are normal. PERRL. EOMI. Head: Atraumatic. Nose: No congestion/rhinnorhea. Mouth/Throat: Mucous membranes are moist.  Oropharynx non-erythematous. Neck: No stridor.  No cervical spine tenderness to palpation. Hematological/Lymphatic/Immunilogical: No cervical lymphadenopathy. Cardiovascular: Normal rate, regular rhythm. Grossly normal heart sounds.  Good peripheral circulation. Respiratory: Normal respiratory effort.  No retractions. Lungs CTAB. Gastrointestinal: Soft and nontender. No distention. No abdominal bruits. No CVA tenderness. Musculoskeletal: No lower extremity tenderness nor edema.  No joint effusions. Neurologic:  Normal speech and language. No gross focal neurologic deficits are appreciated. No gait instability. Skin:  Third digit right hand has reduced edema and erythema. Patient has mild discharge dorsal aspect of the finger. Papular lesion distal to the erythema consistent with insect bite. Psychiatric: Mood and affect are normal. Speech and behavior are normal.  ____________________________________________   LABS (all labs ordered are listed, but only abnormal results are displayed)  Labs Reviewed - No data to  display ____________________________________________  EKG   ____________________________________________  RADIOLOGY   ____________________________________________   PROCEDURES  Procedure(s) performed: None  Procedures  Critical Care performed: No  ____________________________________________   INITIAL IMPRESSION / ASSESSMENT AND PLAN / ED COURSE  Pertinent labs & imaging results that were available during my care of the patient were reviewed by me and considered in my medical decision making (see chart for details).  Resolving cellulitis third digit right hand. Patient given discharge care instructions. Patient advised to continue and finish all antibiotics. Patient given a work note. Patient advised return by ER for condition worsens.  Clinical Course   Patient dressing was removed, area clean, and redress. Patient placed back in a finger splint.  ____________________________________________   FINAL CLINICAL IMPRESSION(S) / ED DIAGNOSES  Final diagnoses:  Visit for wound check      NEW MEDICATIONS STARTED DURING THIS VISIT:  New Prescriptions   No medications on file     Note:  This document was prepared using Dragon voice recognition software and may include unintentional dictation errors.    Sable Feil, PA-C 09/17/15 1001    Hinda Kehr, MD 09/17/15 [phone]

## 2015-09-17 NOTE — Discharge Instructions (Signed)
Continue present medication. Wearing a splint only done the day remove for sleeping.

## 2015-09-17 NOTE — ED Triage Notes (Signed)
Here for wound check to left 3 rd digit  Dressing and splint removed

## 2018-09-02 ENCOUNTER — Other Ambulatory Visit: Payer: Self-pay

## 2018-09-02 DIAGNOSIS — Z20822 Contact with and (suspected) exposure to covid-19: Secondary | ICD-10-CM

## 2018-09-03 LAB — NOVEL CORONAVIRUS, NAA: SARS-CoV-2, NAA: NOT DETECTED

## 2018-09-05 ENCOUNTER — Telehealth: Payer: Self-pay | Admitting: *Deleted

## 2018-09-05 NOTE — Telephone Encounter (Signed)
Patient notified of negative COVID result. Patient did screening test and does not have symptoms. Advised to continue safe practices and report any COVID symptoms to PCP for retest.

## 2019-07-15 ENCOUNTER — Emergency Department: Payer: BC Managed Care – PPO

## 2019-07-15 ENCOUNTER — Other Ambulatory Visit: Payer: Self-pay

## 2019-07-15 ENCOUNTER — Encounter: Payer: Self-pay | Admitting: Emergency Medicine

## 2019-07-15 ENCOUNTER — Emergency Department
Admission: EM | Admit: 2019-07-15 | Discharge: 2019-07-16 | Disposition: A | Discharge status: Home / Self Care | Payer: BC Managed Care – PPO | Attending: Emergency Medicine | Admitting: Emergency Medicine

## 2019-07-15 DIAGNOSIS — I10 Essential (primary) hypertension: Secondary | ICD-10-CM | POA: Insufficient documentation

## 2019-07-15 DIAGNOSIS — M1611 Unilateral primary osteoarthritis, right hip: Secondary | ICD-10-CM | POA: Diagnosis not present

## 2019-07-15 DIAGNOSIS — Z79899 Other long term (current) drug therapy: Secondary | ICD-10-CM | POA: Insufficient documentation

## 2019-07-15 DIAGNOSIS — M79604 Pain in right leg: Secondary | ICD-10-CM | POA: Diagnosis present

## 2019-07-15 MED ORDER — HYDROCODONE-ACETAMINOPHEN 5-325 MG PO TABS: 1.0000 | ORAL_TABLET | ORAL | 0 refills | Status: DC | PRN

## 2019-07-15 MED ORDER — DEXAMETHASONE SODIUM PHOSPHATE 10 MG/ML IJ SOLN
10.0000 mg | Freq: Once | INTRAMUSCULAR | Status: AC
  Administered 2019-07-16: 10 mg via INTRAMUSCULAR
  Filled 2019-07-15: qty 1

## 2019-07-15 MED ORDER — OXYCODONE-ACETAMINOPHEN 5-325 MG PO TABS
1.0000 | ORAL_TABLET | Freq: Once | ORAL | Status: AC
  Administered 2019-07-16: 1 via ORAL
  Filled 2019-07-15: qty 1

## 2019-07-15 MED ORDER — PREDNISONE 50 MG PO TABS: 50.0000 mg | ORAL_TABLET | Freq: Every day | ORAL | 0 refills | Status: DC

## 2019-07-15 NOTE — ED Triage Notes (Signed)
Patient with complaint of left hip and upper leg pain that started in January. Patient states that she saw Dr. Sabra Heck for it and was told it was arthritis. Patient states that she was started on IBU and given shots and that it was getting better. Patient reports that the pain has become worse over the past 3 days.

## 2019-07-15 NOTE — ED Provider Notes (Signed)
[facility] Emergency Department Provider Note  ____________________________________________  Time seen: Approximately 11:34 PM  I have reviewed the triage vital signs and the nursing notes.   HISTORY  Chief Complaint Leg Pain    HPI Kim Gomez is a 64 y.o. female who presents the emergency department with acute on chronic right hip and right knee pain.  Patient denies any recent falls or trauma.  She does have a history of arthritis to the hip and the knee and is seeing orthopedics for same.  She is received 2 cortisone injections and states that while pain is typically well managed it has increased over the last 3 days.  Again no recent trauma.  No complaints of lower back pain.  No urinary or GI complaints.         Past Medical History:  Diagnosis Date  . Arthritis    legs, hands  . Cellulitis of right knee    started on antibiotics 06/21/15.    . Family history of adverse reaction to anesthesia    sister and mom -PONV  . Hypercholesteremia   . Hypertension     Patient Active Problem List   Diagnosis Date Noted  . Special screening for malignant neoplasms, colon   . Benign neoplasm of ascending colon   . Benign neoplasm of descending colon     Past Surgical History:  Procedure Laterality Date  . ABDOMINAL HYSTERECTOMY    . COLONOSCOPY WITH PROPOFOL N/A 06/27/2015   Procedure: COLONOSCOPY WITH PROPOFOL;  Surgeon: Lucilla Lame, MD;  Location: [place];  Service: Endoscopy;  Laterality: N/A;  . POLYPECTOMY  06/27/2015   Procedure: POLYPECTOMY;  Surgeon: Lucilla Lame, MD;  Location: [place];  Service: Endoscopy;;    Prior to Admission medications   Medication Sig Start Date End Date Taking? Authorizing Provider  amoxicillin (AMOXIL) 500 MG tablet Take 1 tablet (500 mg total) by mouth 3 (three) times daily. 06/21/15   Mortimer Fries, PA-C  Calcium Carb-Cholecalciferol (CALCIUM 500 +D PO) Take by mouth daily.    [provider]  estradiol (ESTRACE) 1 MG tablet Take by mouth.    [provider]  HYDROcodone-acetaminophen (NORCO/VICODIN) 5-325 MG tablet Take 1 tablet by mouth every 4 (four) hours as needed for moderate pain. 07/15/19   Grantley Savage, Charline Bills, PA-C  ibuprofen (ADVIL,MOTRIN) 600 MG tablet Take 1 tablet (600 mg total) by mouth every 8 (eight) hours as needed. 09/15/15   Sable Feil, PA-C  lisinopril-hydrochlorothiazide (PRINZIDE,ZESTORETIC) 20-12.5 MG tablet Take by mouth.    [provider]  lovastatin (MEVACOR) 40 MG tablet Take by mouth.    [provider]  medroxyPROGESTERone (PROVERA) 10 MG tablet Take by mouth.    [provider]  polyethylene glycol (GOLYTELY) 236 g solution Take 4,000 mLs by mouth once. Drink one 8 oz glass every 20 min until stools are clear. 06/23/15   Lucilla Lame, MD  predniSONE (DELTASONE) 50 MG tablet Take 1 tablet (50 mg total) by mouth daily with breakfast. 07/15/19   Mesa Janus, Charline Bills, PA-C  Prenatal Vit-Fe Fumarate-FA (PRENATAL VITAMIN PLUS LOW IRON PO) Take by mouth daily.    [provider]  sulfamethoxazole-trimethoprim (BACTRIM DS,SEPTRA DS) 800-160 MG tablet Take 1 tablet by mouth 2 (two) times daily. 06/21/15   Mortimer Fries, PA-C  sulfamethoxazole-trimethoprim (BACTRIM DS,SEPTRA DS) 800-160 MG tablet Take 1 tablet by mouth 2 (two) times daily. 09/15/15   Sable Feil, PA-C  traZODone (DESYREL) 50 MG tablet  Take by mouth.    [provider]  triamcinolone cream (KENALOG) 0.1 % Apply topically. 05/21/15   [provider]    Allergies Patient has no known allergies.  No family history on file.  Social History Social History   Tobacco Use  . Smoking status: Never Smoker  . Smokeless tobacco: Never Used  Substance Use Topics  . Alcohol use: No  . Drug use: Never     Review of Systems  Constitutional: No fever/chills Eyes: No visual changes. No discharge ENT: No upper respiratory  complaints. Cardiovascular: no chest pain. Respiratory: no cough. No SOB. Gastrointestinal: No abdominal pain.  No nausea, no vomiting.  No diarrhea.  No constipation. Genitourinary: Negative for dysuria. No hematuria Musculoskeletal: N acute on chronic right hip and right knee pain Skin: Negative for rash, abrasions, lacerations, ecchymosis. Neurological: Negative for headaches, focal weakness or numbness. 10-point ROS otherwise negative.  ____________________________________________   PHYSICAL EXAM:  VITAL SIGNS: ED Triage Vitals  Enc Vitals Group     BP 07/15/19 2105 (!) 170/97     Pulse Rate 07/15/19 2105 79     Resp 07/15/19 2105 18     Temp 07/15/19 2105 98.3 F (36.8 C)     Temp Source 07/15/19 2105 Oral     SpO2 07/15/19 2105 99 %     Weight 07/15/19 2106 165 lb (74.8 kg)     Height 07/15/19 2106 5\' 4"  (1.626 m)     Head Circumference --      Peak Flow --      Pain Score 07/15/19 2106 10     Pain Loc --      Pain Edu? --      Excl. in [place]? --      Constitutional: Alert and oriented. Well appearing and in no acute distress. Eyes: Conjunctivae are normal. PERRL. EOMI. Head: Atraumatic. ENT:      Ears:       Nose: No congestion/rhinnorhea.      Mouth/Throat: Mucous membranes are moist.  Neck: No stridor.    Cardiovascular: Normal rate, regular rhythm. Normal S1 and S2.  Good peripheral circulation. Respiratory: Normal respiratory effort without tachypnea or retractions. Lungs CTAB. Good air entry to the bases with no decreased or absent breath sounds. Musculoskeletal: Full range of motion to all extremities. No gross deformities appreciated.  Visualization of the right hip and right knee reveal no visible signs of trauma.  Good range of motion is appreciated.  Patient is tender palpation along the anterior and lateral aspect of the right hip.  No palpable abnormalities.  Special tests of the knee are negative.  Dorsalis pedis pulses sensation intact  distally. Neurologic:  Normal speech and language. No gross focal neurologic deficits are appreciated.  Skin:  Skin is warm, dry and intact. No rash noted. Psychiatric: Mood and affect are normal. Speech and behavior are normal. Patient exhibits appropriate insight and judgement.   ____________________________________________   LABS (all labs ordered are listed, but only abnormal results are displayed)  Labs Reviewed - No data to display ____________________________________________  EKG   ____________________________________________  RADIOLOGY I personally viewed and evaluated these images as part of my medical decision making, as well as reviewing the written report by the radiologist.  DG Knee Complete 4 Views Right  Result Date: 07/15/2019 CLINICAL DATA:  Pain. EXAM: RIGHT KNEE - COMPLETE 4+ VIEW COMPARISON:  None. FINDINGS: No evidence of fracture, dislocation, or joint effusion. No evidence of arthropathy or other focal  bone abnormality. Soft tissues are unremarkable. IMPRESSION: Negative. Electronically Signed   By: Constance Holster M.D.   On: 07/15/2019 23:15   DG Hip Unilat W or Wo Pelvis 2-3 Views Right  Result Date: 07/15/2019 CLINICAL DATA:  Pain. EXAM: DG HIP (WITH OR WITHOUT PELVIS) 2-3V RIGHT COMPARISON:  None. FINDINGS: There are end-stage degenerative changes of the right hip. There are mild degenerative changes of the left hip. There is no acute displaced fracture or dislocation. IMPRESSION: End-stage degenerative changes of the right hip. Electronically Signed   By: Constance Holster M.D.   On: 07/15/2019 23:13    ____________________________________________    PROCEDURES  Procedure(s) performed:    Procedures    Medications  dexamethasone (DECADRON) injection 10 mg (has no administration in time range)  oxyCODONE-acetaminophen (PERCOCET/ROXICET) 5-325 MG per tablet 1 tablet (has no administration in time range)      ____________________________________________   INITIAL IMPRESSION / ASSESSMENT AND PLAN / ED COURSE  Pertinent labs & imaging results that were available during my care of the patient were reviewed by me and considered in my medical decision making (see chart for details).  Review of the [place] CSRS was performed in accordance of the [place] prior to dispensing any controlled drugs.           Patient's diagnosis is consistent with arthritis of the right hip.  Patient presented to the emergency department with acute on chronic right hip and knee pain.  X-ray reveals end-stage degenerative changes of the right hip.  Mild arthritic changes of the right knee are appreciated.  Patient will be given a course of steroids, pain medication for symptom relief.  Follow-up with orthopedics for further management of her chronic osteoarthritis of the hip..  Patient is given ED precautions to return to the ED for any worsening or new symptoms.     ____________________________________________  FINAL CLINICAL IMPRESSION(S) / ED DIAGNOSES  Final diagnoses:  Primary osteoarthritis of right hip      NEW MEDICATIONS STARTED DURING THIS VISIT:  ED Discharge Orders         Ordered    predniSONE (DELTASONE) 50 MG tablet  Daily with breakfast     Discontinue  Reprint     07/15/19 2333    HYDROcodone-acetaminophen (NORCO/VICODIN) 5-325 MG tablet  Every 4 hours PRN     Discontinue  Reprint     07/15/19 2333              This chart was dictated using voice recognition software/Dragon. Despite best efforts to proofread, errors can occur which can change the meaning. Any change was purely unintentional.    Darletta Moll, PA-C 07/15/19 2336    Blake Divine, MD 07/16/19 1810

## 2019-08-28 ENCOUNTER — Other Ambulatory Visit: Payer: Self-pay | Admitting: Orthopedic Surgery

## 2019-08-31 ENCOUNTER — Encounter
Admission: RE | Admit: 2019-08-31 | Discharge: 2019-08-31 | Disposition: A | Discharge status: Home / Self Care | Payer: BC Managed Care – PPO | Source: Ambulatory Visit | Attending: Orthopedic Surgery | Admitting: Orthopedic Surgery

## 2019-08-31 ENCOUNTER — Other Ambulatory Visit: Payer: Self-pay

## 2019-08-31 DIAGNOSIS — Z01818 Encounter for other preprocedural examination: Secondary | ICD-10-CM | POA: Diagnosis present

## 2019-08-31 HISTORY — DX: Anxiety disorder, unspecified: F41.9

## 2019-08-31 HISTORY — DX: Depression, unspecified: F32.A

## 2019-08-31 LAB — CBC
HCT: 37.1 % (ref 36.0–46.0)
Hemoglobin: 12.1 g/dL (ref 12.0–15.0)
MCH: 30.3 pg (ref 26.0–34.0)
MCHC: 32.6 g/dL (ref 30.0–36.0)
MCV: 93 fL (ref 80.0–100.0)
Platelets: 210 10*3/uL (ref 150–400)
RBC: 3.99 MIL/uL (ref 3.87–5.11)
RDW: 13.2 % (ref 11.5–15.5)
WBC: 9.1 10*3/uL (ref 4.0–10.5)
nRBC: 0 % (ref 0.0–0.2)

## 2019-08-31 LAB — URINALYSIS, ROUTINE W REFLEX MICROSCOPIC
Bilirubin Urine: NEGATIVE
Glucose, UA: NEGATIVE mg/dL
Ketones, ur: NEGATIVE mg/dL
Leukocytes,Ua: NEGATIVE
Nitrite: NEGATIVE
Protein, ur: NEGATIVE mg/dL
Specific Gravity, Urine: 1.015 (ref 1.005–1.030)
pH: 7 (ref 5.0–8.0)

## 2019-08-31 LAB — TYPE AND SCREEN
ABO/RH(D): O POS
Antibody Screen: NEGATIVE

## 2019-08-31 LAB — PROTIME-INR
INR: 1 (ref 0.8–1.2)
Prothrombin Time: 12.6 seconds (ref 11.4–15.2)

## 2019-08-31 LAB — BASIC METABOLIC PANEL
Anion gap: 8 (ref 5–15)
BUN: 15 mg/dL (ref 8–23)
CO2: 26 mmol/L (ref 22–32)
Calcium: 8.9 mg/dL (ref 8.9–10.3)
Chloride: 106 mmol/L (ref 98–111)
Creatinine, Ser: 0.75 mg/dL (ref 0.44–1.00)
GFR calc Af Amer: >60 mL/min (ref >60)
GFR calc non Af Amer: >60 mL/min (ref >60)
Glucose, Bld: 100 mg/dL — ABNORMAL HIGH (ref 70–99)
Potassium: 3.7 mmol/L (ref 3.5–5.1)
Sodium: 140 mmol/L (ref 135–145)

## 2019-08-31 LAB — APTT: aPTT: 31 seconds (ref 24–36)

## 2019-08-31 LAB — SURGICAL PCR SCREEN
MRSA, PCR: NEGATIVE
Staphylococcus aureus: NEGATIVE

## 2019-08-31 NOTE — Patient Instructions (Signed)
Your procedure is scheduled on:Mon. 8/23 Report to Day Surgery. To find out your arrival time please call [phone] between 1PM - 3PM on Friday.8/20  Remember: Instructions that are not followed completely may result in serious medical risk,  up to and including death, or upon the discretion of your surgeon and anesthesiologist your  surgery may need to be rescheduled.     _X__ 1. Do not eat food after midnight the night before your procedure.                 No chewing gum or hard candies. You may drink clear liquids up to 2 hours                 before you are scheduled to arrive for your surgery- DO not drink clear                 liquids within 2 hours of the start of your surgery.                 Clear Liquids include:  water, apple juice without pulp, clear Gatorade, G2 or                  Gatorade Zero (avoid Red/Purple/Blue), Black Coffee or Tea (Do not add                 anything to coffee or tea). _____2.   Complete the "Ensure Clear Pre-surgery Clear Carbohydrate Drink" provided to you, 2 hours before arrival. **If you       are diabetic you will be provided with an alternative drink, Gatorade Zero or G2.  __X__2.  On the morning of surgery brush your teeth with toothpaste and water, you                may rinse your mouth with mouthwash if you wish.  Do not swallow any toothpaste of mouthwash.     ___ 3.  No Alcohol for 24 hours before or after surgery.   ___ 4.  Do Not Smoke or use e-cigarettes For 24 Hours Prior to Your Surgery.                 Do not use any chewable tobacco products for at least 6 hours prior to                 Surgery.  ___  5.  Do not use any recreational drugs (marijuana, cocaine, heroin, ecstasy, MDMA or other)                For at least one week prior to your surgery.  Combination of these drugs with anesthesia                May have life threatening results.  ____  6.  Bring all medications with you on the day of  surgery if instructed.   __x__  7.  Notify your doctor if there is any change in your medical condition      (cold, fever, infections).     Do not wear jewelry, make-up, hairpins, clips or nail polish. Do not wear lotions, powders, or perfumes. You may wear deodorant. Do not shave 48 hours prior to surgery. Do not bring valuables to the hospital.    [facility] is not responsible for any belongings or valuables.  Contacts, dentures or bridgework may not be worn into surgery. Leave your suitcase in the car. After surgery it may  be brought to your room. For patients admitted to the hospital, discharge time is determined by your treatment team.   Patients discharged the day of surgery will not be allowed to drive home.   Make arrangements for someone to be with you for the first 24 hours of your Same Day Discharge.    Please read over the following fact sheets that you were given:       _x___ Take these medicines the morning of surgery with A SIP OF WATER:    1.amLODipine (NORVASC) 2.5 MG tablet  2. escitalopram (LEXAPRO) 20 MG tablet  3. estradiol (ESTRACE) 1 MG tablet  4.HYDROcodone-acetaminophen (NORCO/VICODIN) 5-325 MG tablet if needed  5.lovastatin (MEVACOR) 20 MG tablet  6.medroxyPROGESTERone (PROVERA) 10 MG tablet             7.methocarbamol (ROBAXIN) 500 MG tablet if needed  ____ Fleet Enema (as directed)   __x__ Use CHG Soap (or wipes) as directed  ____ Use Benzoyl Peroxide Gel as instructed  ____ Use inhalers on the day of surgery  ____ Stop metformin 2 days prior to surgery    ____ Take 1/2 of usual insulin dose the night before surgery. No insulin the morning          of surgery.   ____ Stop Coumadin/Plavix/aspirin on   __x__ Stop Anti-inflammatories meloxicam (MOBIC) 15 MG tablet, ibuprofen aleve and aspirin.   May take tylenol, or pain medication   ____ Stop supplements until after surgery.    ____ Bring C-Pap to the hospital.    If you have any  questions regarding your pre-procedure instructions,  Please call Pre-admit Testing at [phone]

## 2019-09-06 ENCOUNTER — Other Ambulatory Visit
Admission: RE | Admit: 2019-09-06 | Discharge: 2019-09-06 | Disposition: A | Discharge status: Home / Self Care | Payer: BC Managed Care – PPO | Source: Ambulatory Visit | Attending: Orthopedic Surgery | Admitting: Orthopedic Surgery

## 2019-09-06 ENCOUNTER — Other Ambulatory Visit: Payer: Self-pay

## 2019-09-06 DIAGNOSIS — Z20822 Contact with and (suspected) exposure to covid-19: Secondary | ICD-10-CM | POA: Diagnosis not present

## 2019-09-06 DIAGNOSIS — Z01812 Encounter for preprocedural laboratory examination: Secondary | ICD-10-CM | POA: Diagnosis present

## 2019-09-07 LAB — SARS CORONAVIRUS 2 (TAT 6-24 HRS): SARS Coronavirus 2: NEGATIVE

## 2019-09-10 ENCOUNTER — Ambulatory Visit: Payer: BC Managed Care – PPO | Admitting: Anesthesiology

## 2019-09-10 ENCOUNTER — Encounter
Admission: RE | Disposition: A | Discharge status: Home / Self Care | Payer: Self-pay | Source: Ambulatory Visit | Attending: Orthopedic Surgery

## 2019-09-10 ENCOUNTER — Other Ambulatory Visit: Payer: Self-pay

## 2019-09-10 ENCOUNTER — Encounter: Payer: Self-pay | Admitting: Orthopedic Surgery

## 2019-09-10 ENCOUNTER — Observation Stay
Admission: RE | Admit: 2019-09-10 | Discharge: 2019-09-12 | Disposition: A | Discharge status: Home / Self Care | Payer: BC Managed Care – PPO | Source: Ambulatory Visit | Attending: Orthopedic Surgery | Admitting: Orthopedic Surgery

## 2019-09-10 ENCOUNTER — Ambulatory Visit: Payer: BC Managed Care – PPO

## 2019-09-10 DIAGNOSIS — I10 Essential (primary) hypertension: Secondary | ICD-10-CM | POA: Diagnosis not present

## 2019-09-10 DIAGNOSIS — M25551 Pain in right hip: Secondary | ICD-10-CM | POA: Diagnosis present

## 2019-09-10 DIAGNOSIS — M1611 Unilateral primary osteoarthritis, right hip: Secondary | ICD-10-CM | POA: Diagnosis not present

## 2019-09-10 DIAGNOSIS — Z419 Encounter for procedure for purposes other than remedying health state, unspecified: Secondary | ICD-10-CM

## 2019-09-10 DIAGNOSIS — Z96641 Presence of right artificial hip joint: Secondary | ICD-10-CM | POA: Diagnosis not present

## 2019-09-10 HISTORY — PX: TOTAL HIP ARTHROPLASTY: SHX124

## 2019-09-10 LAB — ABO/RH: ABO/RH(D): O POS

## 2019-09-10 SURGERY — ARTHROPLASTY, HIP, TOTAL, ANTERIOR APPROACH
Anesthesia: General | Site: Hip | Laterality: Right

## 2019-09-10 MED ORDER — FAMOTIDINE 20 MG PO TABS
ORAL_TABLET | ORAL | Status: AC
  Filled 2019-09-10: qty 1

## 2019-09-10 MED ORDER — MIDAZOLAM HCL 2 MG/2ML IJ SOLN
INTRAMUSCULAR | Status: AC
  Filled 2019-09-10: qty 2

## 2019-09-10 MED ORDER — ACETAMINOPHEN 10 MG/ML IV SOLN
INTRAVENOUS | Status: DC | PRN
  Administered 2019-09-10: 1000 mg via INTRAVENOUS

## 2019-09-10 MED ORDER — TRANEXAMIC ACID-NACL 1000-0.7 MG/100ML-% IV SOLN
INTRAVENOUS | Status: AC
  Filled 2019-09-10: qty 100

## 2019-09-10 MED ORDER — POVIDONE-IODINE 10 % EX SWAB
2.0000 "application " | Freq: Once | CUTANEOUS | Status: AC
  Administered 2019-09-10: 2 via TOPICAL

## 2019-09-10 MED ORDER — CHLORHEXIDINE GLUCONATE 0.12 % MT SOLN
OROMUCOSAL | Status: AC
  Administered 2019-09-10: 15 mL via OROMUCOSAL
  Filled 2019-09-10: qty 15

## 2019-09-10 MED ORDER — BUPIVACAINE HCL (PF) 0.25 % IJ SOLN
INTRAMUSCULAR | Status: AC
  Filled 2019-09-10: qty 30

## 2019-09-10 MED ORDER — PROPOFOL 500 MG/50ML IV EMUL
INTRAVENOUS | Status: DC | PRN
  Administered 2019-09-10: 60 ug/kg/min via INTRAVENOUS

## 2019-09-10 MED ORDER — ONDANSETRON HCL 4 MG/2ML IJ SOLN: 4.0000 mg | Freq: Four times a day (QID) | INTRAMUSCULAR | Status: DC | PRN

## 2019-09-10 MED ORDER — ASPIRIN 81 MG PO CHEW
81.0000 mg | CHEWABLE_TABLET | Freq: Two times a day (BID) | ORAL | Status: DC
  Administered 2019-09-10 – 2019-09-12 (×4): 81 mg via ORAL
  Filled 2019-09-10 (×4): qty 1

## 2019-09-10 MED ORDER — EPINEPHRINE PF 1 MG/ML IJ SOLN
INTRAMUSCULAR | Status: AC
  Filled 2019-09-10: qty 1

## 2019-09-10 MED ORDER — ACETAMINOPHEN 500 MG PO TABS
500.0000 mg | ORAL_TABLET | Freq: Four times a day (QID) | ORAL | Status: AC
  Administered 2019-09-10 – 2019-09-11 (×2): 500 mg via ORAL
  Filled 2019-09-10 (×3): qty 1

## 2019-09-10 MED ORDER — PRAVASTATIN SODIUM 20 MG PO TABS
20.0000 mg | ORAL_TABLET | Freq: Every day | ORAL | Status: DC
  Administered 2019-09-11: 20 mg via ORAL
  Filled 2019-09-10: qty 1

## 2019-09-10 MED ORDER — PHENOL 1.4 % MT LIQD
1.0000 | OROMUCOSAL | Status: DC | PRN
  Filled 2019-09-10: qty 177

## 2019-09-10 MED ORDER — TRAZODONE HCL 50 MG PO TABS
50.0000 mg | ORAL_TABLET | Freq: Every day | ORAL | Status: DC
  Administered 2019-09-10 – 2019-09-11 (×2): 50 mg via ORAL
  Filled 2019-09-10 (×2): qty 1

## 2019-09-10 MED ORDER — LACTATED RINGERS IV SOLN: INTRAVENOUS | Status: DC

## 2019-09-10 MED ORDER — TRANEXAMIC ACID-NACL 1000-0.7 MG/100ML-% IV SOLN
1000.0000 mg | INTRAVENOUS | Status: AC
  Administered 2019-09-10: 1000 mg via INTRAVENOUS

## 2019-09-10 MED ORDER — MIDAZOLAM HCL 5 MG/5ML IJ SOLN
INTRAMUSCULAR | Status: DC | PRN
  Administered 2019-09-10: 2 mg via INTRAVENOUS

## 2019-09-10 MED ORDER — OXYCODONE HCL 5 MG/5ML PO SOLN: 5.0000 mg | Freq: Once | ORAL | Status: DC | PRN

## 2019-09-10 MED ORDER — BUPIVACAINE HCL (PF) 0.5 % IJ SOLN
INTRAMUSCULAR | Status: DC | PRN
  Administered 2019-09-10: 3 mL via INTRATHECAL

## 2019-09-10 MED ORDER — FENTANYL CITRATE (PF) 100 MCG/2ML IJ SOLN
INTRAMUSCULAR | Status: AC
  Filled 2019-09-10: qty 2

## 2019-09-10 MED ORDER — ORAL CARE MOUTH RINSE: 15.0000 mL | Freq: Once | OROMUCOSAL | Status: AC

## 2019-09-10 MED ORDER — ONDANSETRON HCL 4 MG/2ML IJ SOLN
INTRAMUSCULAR | Status: DC | PRN
  Administered 2019-09-10: 4 mg via INTRAVENOUS

## 2019-09-10 MED ORDER — GLYCOPYRROLATE 0.2 MG/ML IJ SOLN
INTRAMUSCULAR | Status: AC
  Filled 2019-09-10: qty 1

## 2019-09-10 MED ORDER — ONDANSETRON HCL 4 MG/2ML IJ SOLN: 4.0000 mg | Freq: Once | INTRAMUSCULAR | Status: DC | PRN

## 2019-09-10 MED ORDER — OXYCODONE HCL 5 MG PO TABS: 5.0000 mg | ORAL_TABLET | Freq: Once | ORAL | Status: DC | PRN

## 2019-09-10 MED ORDER — ACETAMINOPHEN 325 MG PO TABS
325.0000 mg | ORAL_TABLET | Freq: Four times a day (QID) | ORAL | Status: DC | PRN
  Administered 2019-09-11: 650 mg via ORAL

## 2019-09-10 MED ORDER — BACITRACIN 50000 UNITS IM SOLR
INTRAMUSCULAR | Status: AC
  Filled 2019-09-10: qty 2

## 2019-09-10 MED ORDER — METOCLOPRAMIDE HCL 10 MG PO TABS: 5.0000 mg | ORAL_TABLET | Freq: Three times a day (TID) | ORAL | Status: DC | PRN

## 2019-09-10 MED ORDER — BISACODYL 10 MG RE SUPP
10.0000 mg | Freq: Every day | RECTAL | Status: DC | PRN
  Administered 2019-09-12: 10 mg via RECTAL
  Filled 2019-09-10: qty 1

## 2019-09-10 MED ORDER — MORPHINE SULFATE (PF) 2 MG/ML IV SOLN: 0.5000 mg | INTRAVENOUS | Status: DC | PRN

## 2019-09-10 MED ORDER — HYDROCODONE-ACETAMINOPHEN 5-325 MG PO TABS
1.0000 | ORAL_TABLET | ORAL | Status: DC | PRN
  Administered 2019-09-10 – 2019-09-12 (×2): 2 via ORAL
  Filled 2019-09-10 (×2): qty 2

## 2019-09-10 MED ORDER — GLYCOPYRROLATE 0.2 MG/ML IJ SOLN
INTRAMUSCULAR | Status: DC | PRN
  Administered 2019-09-10: .2 mg via INTRAVENOUS

## 2019-09-10 MED ORDER — CEFAZOLIN SODIUM-DEXTROSE 2-4 GM/100ML-% IV SOLN
2.0000 g | INTRAVENOUS | Status: AC
  Administered 2019-09-10: 2 g via INTRAVENOUS

## 2019-09-10 MED ORDER — BUPIVACAINE-EPINEPHRINE (PF) 0.25% -1:200000 IJ SOLN
INTRAMUSCULAR | Status: DC | PRN
  Administered 2019-09-10: 25 mL via PERINEURAL

## 2019-09-10 MED ORDER — MENTHOL 3 MG MT LOZG
1.0000 | LOZENGE | OROMUCOSAL | Status: DC | PRN
  Filled 2019-09-10: qty 9

## 2019-09-10 MED ORDER — CEFAZOLIN SODIUM-DEXTROSE 2-4 GM/100ML-% IV SOLN
INTRAVENOUS | Status: AC
  Filled 2019-09-10: qty 100

## 2019-09-10 MED ORDER — ALUM & MAG HYDROXIDE-SIMETH 200-200-20 MG/5ML PO SUSP: 30.0000 mL | ORAL | Status: DC | PRN

## 2019-09-10 MED ORDER — SODIUM CHLORIDE FLUSH 0.9 % IV SOLN
INTRAVENOUS | Status: AC
  Filled 2019-09-10: qty 20

## 2019-09-10 MED ORDER — FAMOTIDINE 20 MG PO TABS: 20.0000 mg | ORAL_TABLET | Freq: Once | ORAL | Status: DC

## 2019-09-10 MED ORDER — SODIUM CHLORIDE 0.9 % IR SOLN
Status: DC | PRN
  Administered 2019-09-10: 1100 mL

## 2019-09-10 MED ORDER — MAGNESIUM HYDROXIDE 400 MG/5ML PO SUSP
30.0000 mL | Freq: Every day | ORAL | Status: DC | PRN
  Administered 2019-09-12: 30 mL via ORAL
  Filled 2019-09-10: qty 30

## 2019-09-10 MED ORDER — SODIUM CHLORIDE 0.9 % IV SOLN
INTRAVENOUS | Status: DC | PRN
  Administered 2019-09-10 (×2): 25 ug/min via INTRAVENOUS

## 2019-09-10 MED ORDER — FENTANYL CITRATE (PF) 100 MCG/2ML IJ SOLN: 25.0000 ug | INTRAMUSCULAR | Status: DC | PRN

## 2019-09-10 MED ORDER — ACETAMINOPHEN 10 MG/ML IV SOLN
INTRAVENOUS | Status: AC
  Filled 2019-09-10: qty 100

## 2019-09-10 MED ORDER — NEOMYCIN-POLYMYXIN B GU 40-200000 IR SOLN
Status: AC
  Filled 2019-09-10: qty 20

## 2019-09-10 MED ORDER — ONDANSETRON HCL 4 MG PO TABS: 4.0000 mg | ORAL_TABLET | Freq: Four times a day (QID) | ORAL | Status: DC | PRN

## 2019-09-10 MED ORDER — HYDROCODONE-ACETAMINOPHEN 7.5-325 MG PO TABS
1.0000 | ORAL_TABLET | ORAL | Status: DC | PRN
  Administered 2019-09-11 (×2): 2 via ORAL
  Administered 2019-09-11: 1 via ORAL
  Administered 2019-09-11: 2 via ORAL
  Administered 2019-09-12: 1 via ORAL
  Filled 2019-09-10 (×4): qty 2
  Filled 2019-09-10: qty 1

## 2019-09-10 MED ORDER — MAGNESIUM CITRATE PO SOLN
1.0000 | Freq: Once | ORAL | Status: DC | PRN
  Filled 2019-09-10: qty 296

## 2019-09-10 MED ORDER — MEDROXYPROGESTERONE ACETATE 2.5 MG PO TABS
5.0000 mg | ORAL_TABLET | Freq: Every day | ORAL | Status: DC
  Administered 2019-09-10 – 2019-09-11 (×2): 5 mg via ORAL
  Filled 2019-09-10 (×3): qty 2

## 2019-09-10 MED ORDER — METOCLOPRAMIDE HCL 5 MG/ML IJ SOLN: 5.0000 mg | Freq: Three times a day (TID) | INTRAMUSCULAR | Status: DC | PRN

## 2019-09-10 MED ORDER — ESTRADIOL 1 MG PO TABS
1.0000 mg | ORAL_TABLET | Freq: Every day | ORAL | Status: DC
  Administered 2019-09-11 – 2019-09-12 (×2): 1 mg via ORAL
  Filled 2019-09-10 (×2): qty 1

## 2019-09-10 MED ORDER — PROPOFOL 500 MG/50ML IV EMUL
INTRAVENOUS | Status: AC
  Filled 2019-09-10: qty 50

## 2019-09-10 MED ORDER — CEFAZOLIN SODIUM-DEXTROSE 2-4 GM/100ML-% IV SOLN
2.0000 g | Freq: Four times a day (QID) | INTRAVENOUS | Status: AC
  Administered 2019-09-10 – 2019-09-11 (×2): 2 g via INTRAVENOUS
  Filled 2019-09-10 (×2): qty 100

## 2019-09-10 MED ORDER — DOCUSATE SODIUM 100 MG PO CAPS
100.0000 mg | ORAL_CAPSULE | Freq: Two times a day (BID) | ORAL | Status: DC
  Administered 2019-09-10 – 2019-09-12 (×4): 100 mg via ORAL
  Filled 2019-09-10 (×4): qty 1

## 2019-09-10 MED ORDER — AMLODIPINE BESYLATE 5 MG PO TABS
2.5000 mg | ORAL_TABLET | Freq: Every day | ORAL | Status: DC
  Administered 2019-09-11: 2.5 mg via ORAL
  Filled 2019-09-10: qty 1

## 2019-09-10 MED ORDER — KETOROLAC TROMETHAMINE 15 MG/ML IJ SOLN
15.0000 mg | Freq: Four times a day (QID) | INTRAMUSCULAR | Status: AC
  Administered 2019-09-10 – 2019-09-11 (×3): 15 mg via INTRAVENOUS
  Filled 2019-09-10 (×3): qty 1

## 2019-09-10 MED ORDER — FENTANYL CITRATE (PF) 100 MCG/2ML IJ SOLN
INTRAMUSCULAR | Status: DC | PRN
Start: 2019-09-10 — End: 2019-09-10
  Administered 2019-09-10 (×2): 50 ug via INTRAVENOUS

## 2019-09-10 MED ORDER — PHENYLEPHRINE HCL (PRESSORS) 10 MG/ML IV SOLN
INTRAVENOUS | Status: DC | PRN
  Administered 2019-09-10: 100 ug via INTRAVENOUS

## 2019-09-10 MED ORDER — CHLORHEXIDINE GLUCONATE 0.12 % MT SOLN: 15.0000 mL | Freq: Once | OROMUCOSAL | Status: AC

## 2019-09-10 MED ORDER — ESCITALOPRAM OXALATE 10 MG PO TABS
20.0000 mg | ORAL_TABLET | Freq: Every day | ORAL | Status: DC
  Administered 2019-09-11 – 2019-09-12 (×2): 20 mg via ORAL
  Filled 2019-09-10 (×2): qty 2

## 2019-09-10 MED ORDER — ONDANSETRON HCL 4 MG/2ML IJ SOLN
INTRAMUSCULAR | Status: AC
  Filled 2019-09-10: qty 2

## 2019-09-10 SURGICAL SUPPLY — 51 items
APL PRP STRL LF DISP 70% ISPRP (MISCELLANEOUS) ×1
BLADE SAGITTAL WIDE XTHICK NO (BLADE) ×2 IMPLANT
BRUSH SCRUB EZ  4% CHG (MISCELLANEOUS) ×2
BRUSH SCRUB EZ 4% CHG (MISCELLANEOUS) ×2 IMPLANT
CHLORAPREP W/TINT 26 (MISCELLANEOUS) ×2 IMPLANT
COVER HOLE (Hips) ×1 IMPLANT
COVER WAND RF STERILE (DRAPES) ×2 IMPLANT
DRAPE 3/4 80X56 (DRAPES) ×2 IMPLANT
DRAPE C-ARM XRAY 36X54 (DRAPES) ×1 IMPLANT
DRAPE STERI IOBAN 125X83 (DRAPES) IMPLANT
DRSG AQUACEL AG ADV 3.5X10 (GAUZE/BANDAGES/DRESSINGS) ×1 IMPLANT
DRSG AQUACEL AG ADV 3.5X14 (GAUZE/BANDAGES/DRESSINGS) IMPLANT
ELECT BLADE 6.5 EXT (BLADE) ×2 IMPLANT
ELECT REM PT RETURN 9FT ADLT (ELECTROSURGICAL) ×2
ELECTRODE REM PT RTRN 9FT ADLT (ELECTROSURGICAL) ×1 IMPLANT
GAUZE XEROFORM 1X8 LF (GAUZE/BANDAGES/DRESSINGS) ×1 IMPLANT
GLOVE INDICATOR 8.0 STRL GRN (GLOVE) ×2 IMPLANT
GLOVE SURG ORTHO 8.0 STRL STRW (GLOVE) ×4 IMPLANT
GOWN STRL REUS W/ TWL LRG LVL3 (GOWN DISPOSABLE) ×1 IMPLANT
GOWN STRL REUS W/ TWL XL LVL3 (GOWN DISPOSABLE) ×1 IMPLANT
GOWN STRL REUS W/TWL LRG LVL3 (GOWN DISPOSABLE) ×2
GOWN STRL REUS W/TWL XL LVL3 (GOWN DISPOSABLE) ×2
HEAD OXINIUM PLUS 0 32MM (Hips) ×1 IMPLANT
HOOD PEEL AWAY FLYTE STAYCOOL (MISCELLANEOUS) ×6 IMPLANT
IV NS 1000ML (IV SOLUTION) ×2
IV NS 1000ML BAXH (IV SOLUTION) ×1 IMPLANT
KIT PATIENT CARE HANA TABLE (KITS) ×2 IMPLANT
KIT TURNOVER CYSTO (KITS) ×2 IMPLANT
LINER 3H HEMI SHELL 48MM (Liner) ×1 IMPLANT
LINER ACETABULAR 32X48 (Liner) ×1 IMPLANT
MAT ABSORB  FLUID 56X50 GRAY (MISCELLANEOUS) ×2
MAT ABSORB FLUID 56X50 GRAY (MISCELLANEOUS) ×1 IMPLANT
NDL SAFETY ECLIPSE 18X1.5 (NEEDLE) ×2 IMPLANT
NDL SPNL 20GX3.5 QUINCKE YW (NEEDLE) ×1 IMPLANT
NEEDLE HYPO 18GX1.5 SHARP (NEEDLE) ×4
NEEDLE HYPO 22GX1.5 SAFETY (NEEDLE) ×2 IMPLANT
NEEDLE SPNL 20GX3.5 QUINCKE YW (NEEDLE) ×2 IMPLANT
PACK HIP PROSTHESIS (MISCELLANEOUS) ×2 IMPLANT
PADDING CAST BLEND 4X4 NS (MISCELLANEOUS) ×3 IMPLANT
PILLOW ABDUCTION MEDIUM (MISCELLANEOUS) ×2 IMPLANT
PULSAVAC PLUS IRRIG FAN TIP (DISPOSABLE) ×2
SCREW 6.5X25MM (Screw) ×1 IMPLANT
STAPLER SKIN PROX 35W (STAPLE) ×2 IMPLANT
STEM STD COLLAR SZ2 POLARSTEM (Stem) ×1 IMPLANT
SUT BONE WAX W31G (SUTURE) ×2 IMPLANT
SUT DVC 2 QUILL PDO  T11 36X36 (SUTURE) ×2
SUT DVC 2 QUILL PDO T11 36X36 (SUTURE) ×1 IMPLANT
SUT VIC AB 2-0 CT1 18 (SUTURE) ×2 IMPLANT
SYR 20ML LL LF (SYRINGE) ×2 IMPLANT
SYR BULB EAR ULCER 3OZ GRN STR (SYRINGE) ×1 IMPLANT
TIP FAN IRRIG PULSAVAC PLUS (DISPOSABLE) ×1 IMPLANT

## 2019-09-10 NOTE — Anesthesia Postprocedure Evaluation (Signed)
Anesthesia Post Note  Patient: KEYSHAWNA PROUSE  Procedure(s) Performed: TOTAL HIP ARTHROPLASTY ANTERIOR APPROACH (Right Hip)  Patient location during evaluation: PACU Anesthesia Type: Spinal Level of consciousness: oriented and awake and alert Pain management: pain level controlled Vital Signs Assessment: post-procedure vital signs reviewed and stable Respiratory status: spontaneous breathing, respiratory function stable and patient connected to nasal cannula oxygen Cardiovascular status: blood pressure returned to baseline and stable Postop Assessment: no headache, no backache and no apparent nausea or vomiting Anesthetic complications: no   No complications documented.   Last Vitals:  Vitals:   09/10/19 1735 09/10/19 1744  BP: 131/73 132/74  Pulse: 60 63  Resp: 16 16  Temp: (!) 36.2 C   SpO2: 100% 100%    Last Pain:  Vitals:   09/10/19 1735  TempSrc:   PainSc: 0-No pain                 Arita Miss

## 2019-09-10 NOTE — Transfer of Care (Signed)
Immediate Anesthesia Transfer of Care Note  Patient: Kim Gomez  Procedure(s) Performed: TOTAL HIP ARTHROPLASTY ANTERIOR APPROACH (Right Hip)  Patient Location: PACU  Anesthesia Type:Spinal  Level of Consciousness: drowsy  Airway & Oxygen Therapy: Patient Spontanous Breathing and Patient connected to face mask oxygen  Post-op Assessment: Report given to RN  Post vital signs: stable  Last Vitals:  Vitals Value Taken Time  BP    Temp    Pulse 73 09/10/19 1642  Resp 17 09/10/19 1642  SpO2 100 % 09/10/19 1642  Vitals shown include unvalidated device data.  Last Pain:  Vitals:   09/10/19 1201  TempSrc: Temporal  PainSc: 10-Worst pain ever         Complications: No complications documented.

## 2019-09-10 NOTE — H&P (Signed)
The patient has been re-examined, and the chart reviewed, and there have been no interval changes to the documented history and physical.  Plan a right total hip today.  Anesthesia is not consulted regarding a peripheral nerve block for post-operative pain.  The risks, benefits, and alternatives have been discussed at length, and the patient is willing to proceed.    

## 2019-09-10 NOTE — Op Note (Signed)
09/10/2019  4:40 PM  PATIENT:  Kim Gomez   MRN: [id number]  PRE-OPERATIVE DIAGNOSIS:  Osteoarthritis right hip   POST-OPERATIVE DIAGNOSIS: Same  Procedure: Right Total Hip Replacement  Surgeon: Elyn Aquas. Harlow Mares, MD   Assist: Carlynn Spry, PA-C  Anesthesia: Spinal   EBL: 100 mL   Specimens: None   Drains: None   Components used: A size 2 Polarstem [employer], R3 size 48 mm shell, and a 32 mm +0 mm head    Description of the procedure in detail: After informed consent was obtained and the appropriate extremity marked in the pre-operative holding area, the patient was taken to the operating room and placed in the supine position on the fracture table. All pressure points were well padded and bilateral lower extremities were place in traction spars. The hip was prepped and draped in standard sterile fashion. A spinal anesthetic had been delivered by the anesthesia team. The skin and subcutaneous tissues were injected with a mixture of Marcaine with epinephrine for post-operative pain. A longitudinal incision approximately 10 cm in length was carried out from the anterior superior iliac spine to the greater trochanter. The tensor fascia was divided and blunt dissection was taken down to the level of the joint capsule. The lateral circumflex vessels were cauterized. Deep retractors were placed and a portion of the anterior capsule was excised. Using fluoroscopy the neck cut was planned and carried out with a sagittal saw. The head was passed from the field with use of a corkscrew and hip skid. Deep retractors were placed along the acetabulum and the degenerative labrum and large osteophytes were removed with a Rongeur. The cup was sequentially reamed to a size 48 mm. The wound was irrigated and using fluoroscopy the size 48 mm cup was impacted in to anatomic position. A single screw was placed followed by a threaded hole cover. The final liner was impacted in to position. Attention  was then turned to the proximal femur. The leg was placed in extension and external rotation. The canal was opened and sequentially broached to a size 2. The trial components were placed and the hip relocated. The components were found to be in good position using fluoroscopy. The hip was dislocated and the trial components removed. The final components were impacted in to position and the hip relocated. The final components were again check with fluoroscopy and found to be in good position. Hemostasis was achieved with electrocautery. The deep capsule was injected with Marcaine and epinephrine. The wound was irrigated with bacitracin laced normal saline and the tensor fascia closed with #2 Quill suture. The subcutaneous tissues were closed with 2-0 vicryl and staples for the skin. A sterile dressing was applied and an abduction pillow. Patient tolerated the procedure well and there were no apparent complication. Patient was taken to the recovery room in good condition.   Kurtis Bushman, MD

## 2019-09-10 NOTE — Anesthesia Procedure Notes (Signed)
Spinal  Patient location during procedure: OR Start time: 09/10/2019 2:36 PM Staffing Performed: anesthesiologist  Anesthesiologist: Gunnar Fusi, MD Resident/CRNA: Lerry Liner, CRNA Preanesthetic Checklist Completed: patient identified, IV checked, site marked, risks and benefits discussed, surgical consent, monitors and equipment checked, pre-op evaluation and timeout performed Spinal Block Patient position: sitting Prep: ChloraPrep Patient monitoring: heart rate, continuous pulse ox and blood pressure Approach: midline Location: L2-3 Injection technique: single-shot Needle Needle type: Whitacre  Needle gauge: 24 G Additional Notes Negative heme, negative paresthesia, no pain with injection, good free flow CSF pre/post injection.

## 2019-09-10 NOTE — Anesthesia Preprocedure Evaluation (Signed)
Anesthesia Evaluation  Patient identified by MRN, date of birth, ID band Patient awake    Reviewed: Allergy & Precautions, H&P , NPO status , Patient's Chart, lab work & pertinent test results  History of Anesthesia Complications Negative for: history of anesthetic complications  Airway Mallampati: II  TM Distance: >3 FB Neck ROM: full    Dental  (+) Teeth Intact   Pulmonary neg pulmonary ROS, neg sleep apnea, neg COPD, Not current smoker,    breath sounds clear to auscultation       Cardiovascular hypertension, (-) angina(-) Past MI and (-) Cardiac Stents (-) dysrhythmias  Rhythm:regular Rate:Normal     Neuro/Psych PSYCHIATRIC DISORDERS Anxiety Depression negative neurological ROS     GI/Hepatic negative GI ROS, Neg liver ROS,   Endo/Other  negative endocrine ROS  Renal/GU negative Renal ROS  negative genitourinary   Musculoskeletal   Abdominal   Peds  Hematology negative hematology ROS (+)   Anesthesia Other Findings Obese  Past Medical History: No date: Anxiety No date: Arthritis     Comment:  legs, hands No date: Cellulitis of right knee     Comment:  started on antibiotics 06/21/15.   No date: Depression No date: Family history of adverse reaction to anesthesia     Comment:  sister and mom -PONV No date: Hypercholesteremia No date: Hypertension  Past Surgical History: No date: ABDOMINAL HYSTERECTOMY 06/27/2015: COLONOSCOPY WITH PROPOFOL; N/A     Comment:  Procedure: COLONOSCOPY WITH PROPOFOL;  Surgeon: Lucilla Lame, MD;  Location: [place];  Service:               Endoscopy;  Laterality: N/A; 06/27/2015: POLYPECTOMY     Comment:  Procedure: POLYPECTOMY;  Surgeon: Lucilla Lame, MD;                Location: [place];  Service: Endoscopy;;  BMI    Body Mass Index: 29.18 kg/m      Reproductive/Obstetrics negative OB ROS                              Anesthesia Physical Anesthesia Plan  ASA: II  Anesthesia Plan: General   Post-op Pain Management:    Induction:   PONV Risk Score and Plan: Propofol infusion and TIVA  Airway Management Planned: Simple Face Mask  Additional Equipment:   Intra-op Plan:   Post-operative Plan:   Informed Consent: I have reviewed the patients History and Physical, chart, labs and discussed the procedure including the risks, benefits and alternatives for the proposed anesthesia with the patient or authorized representative who has indicated his/her understanding and acceptance.     Dental Advisory Given  Plan Discussed with: Anesthesiologist, CRNA and Surgeon  Anesthesia Plan Comments:         Anesthesia Quick Evaluation

## 2019-09-11 ENCOUNTER — Encounter: Payer: Self-pay | Admitting: Orthopedic Surgery

## 2019-09-11 DIAGNOSIS — M1611 Unilateral primary osteoarthritis, right hip: Secondary | ICD-10-CM | POA: Diagnosis not present

## 2019-09-11 LAB — CBC
HCT: 30.9 % — ABNORMAL LOW (ref 36.0–46.0)
Hemoglobin: 10.7 g/dL — ABNORMAL LOW (ref 12.0–15.0)
MCH: 31 pg (ref 26.0–34.0)
MCHC: 34.6 g/dL (ref 30.0–36.0)
MCV: 89.6 fL (ref 80.0–100.0)
Platelets: 165 10*3/uL (ref 150–400)
RBC: 3.45 MIL/uL — ABNORMAL LOW (ref 3.87–5.11)
RDW: 13.8 % (ref 11.5–15.5)
WBC: 9.9 10*3/uL (ref 4.0–10.5)
nRBC: 0 % (ref 0.0–0.2)

## 2019-09-11 LAB — BASIC METABOLIC PANEL
Anion gap: 9 (ref 5–15)
BUN: 14 mg/dL (ref 8–23)
CO2: 26 mmol/L (ref 22–32)
Calcium: 8.6 mg/dL — ABNORMAL LOW (ref 8.9–10.3)
Chloride: 103 mmol/L (ref 98–111)
Creatinine, Ser: 0.84 mg/dL (ref 0.44–1.00)
GFR calc Af Amer: >60 mL/min (ref >60)
GFR calc non Af Amer: >60 mL/min (ref >60)
Glucose, Bld: 131 mg/dL — ABNORMAL HIGH (ref 70–99)
Potassium: 3.1 mmol/L — ABNORMAL LOW (ref 3.5–5.1)
Sodium: 138 mmol/L (ref 135–145)

## 2019-09-11 NOTE — Progress Notes (Signed)
  Subjective:  Patient reports pain as moderate.  No other complaints.  Objective:   VITALS:   Vitals:   09/11/19 0030 09/11/19 0410 09/11/19 0827 09/11/19 1225  BP: 126/66 133/74 131/77 139/74  Pulse: 73 92 79 78  Resp: 16 15 20 16   Temp: 98.2 F (36.8 C) 98.4 F (36.9 C) 98 F (36.7 C) 98.2 F (36.8 C)  TempSrc: Oral Oral Oral Oral  SpO2: 97% 97% 97% 95%  Weight:      Height:        PHYSICAL EXAM:  ABD soft Neurovascular intact Dorsiflexion/Plantar flexion intact Incision: dressing C/D/I No cellulitis present Compartment soft  LABS  Results for orders placed or performed during the hospital encounter of 09/10/19 (from the past 24 hour(s))  CBC     Status: Abnormal   Collection Time: 09/11/19  5:29 AM  Result Value Ref Range   WBC 9.9 4.0 - 10.5 K/uL   RBC 3.45 (L) 3.87 - 5.11 MIL/uL   Hemoglobin 10.7 (L) 12.0 - 15.0 g/dL   HCT 30.9 (L) 36 - 46 %   MCV 89.6 80.0 - 100.0 fL   MCH 31.0 26.0 - 34.0 pg   MCHC 34.6 30.0 - 36.0 g/dL   RDW 13.8 11.5 - 15.5 %   Platelets 165 150 - 400 K/uL   nRBC 0.0 0.0 - 0.2 %  Basic metabolic panel     Status: Abnormal   Collection Time: 09/11/19  5:29 AM  Result Value Ref Range   Sodium 138 135 - 145 mmol/L   Potassium 3.1 (L) 3.5 - 5.1 mmol/L   Chloride 103 98 - 111 mmol/L   CO2 26 22 - 32 mmol/L   Glucose, Bld 131 (H) 70 - 99 mg/dL   BUN 14 8 - 23 mg/dL   Creatinine, Ser 0.84 0.44 - 1.00 mg/dL   Calcium 8.6 (L) 8.9 - 10.3 mg/dL   GFR calc non Af Amer >60 >60 mL/min   GFR calc Af Amer >60 >60 mL/min   Anion gap 9 5 - 15    DG HIP OPERATIVE UNILAT W OR W/O PELVIS RIGHT  Result Date: 09/10/2019 CLINICAL DATA:  Right hip arthroplasty EXAM: OPERATIVE RIGHT HIP WITH PELVIS COMPARISON:  None. FLUOROSCOPY TIME:  Radiation Exposure Index (as provided by the fluoroscopic device): 1.96 mGy If the device does not provide the exposure index: Fluoroscopy Time:  8 seconds Number of Acquired Images:  4 FINDINGS: Initial images  demonstrate a cannula extending into the right femoral neck. Subsequently acetabular component is placed with a spacer device in the femur. This is subsequently exchanged for the final prosthesis. No fracture or dislocation is seen. IMPRESSION: Right hip replacement. Electronically Signed   By: Inez Catalina M.D.   On: 09/10/2019 16:36    Assessment/Plan: 1 Day Post-Op   Active Problems:   History of total hip replacement, right   Advance diet Up with therapy Plan for discharge tomorrow with outpatient PT   Lovell Sheehan , MD 09/11/2019, 12:40 PM

## 2019-09-11 NOTE — TOC Progression Note (Signed)
Transition of Care [facility]) - Progression Note    Patient Details  Name: Kim Gomez MRN: [id number] Date of Birth: 09-Apr-1955  Transition of Care [facility]) CM/SW [place], RN Phone Number: 09/11/2019, 10:24 AM  Clinical Narrative:   Met with the patient to discuss DC plan and needs She lives at home with her husband and has help with him She needs a RW I called Zack with [employer] to get the walker brought into the room prior to DC, She has an outpatient PT appointment on Aug 30 and does not need HH, NO additional needs noted          Expected Discharge Plan and Services                                                 Social Determinants of Health (SDOH) Interventions    Readmission Risk Interventions No flowsheet data found.

## 2019-09-11 NOTE — Progress Notes (Signed)
Physical Therapy Treatment Patient Details Name: Kim Gomez MRN: [id number] DOB: 04/17/1955 Today's Date: 09/11/2019    History of Present Illness Pt is a 64 y.o. 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  PMH includes htn, anxiety, and depression.    PT Comments    Pt completed 1 lap around unit and stair training this pm forwards with rails and backwards with walker.  Husband in for training.  Progressing well with no questions or concerns for mobility.  Anticipates discharge home tomorrow with no foreseen barriers in regards to mobility.   Follow Up Recommendations  Outpatient PT     Equipment Recommendations  Rolling walker with 5" wheels;3in1 (PT)    Recommendations for Other Services       Precautions / Restrictions Precautions Precautions: Fall;Anterior Hip Restrictions Weight Bearing Restrictions: Yes RLE Weight Bearing: Weight bearing as tolerated    Mobility  Bed Mobility Overal bed mobility: Needs Assistance Bed Mobility: Supine to Sit     Supine to sit: Min assist;HOB elevated     General bed mobility comments: in recliner before and after session  Transfers Overall transfer level: Needs assistance Equipment used: Rolling walker (2 wheeled) Transfers: Sit to/from Stand Sit to Stand: Supervision Stand pivot transfers: Min guard       General transfer comment: x1 trial standing from bed and x1 trial standing from recliner (vc's for UE/LE placement and overall technique); increased effort to stand; stand step turn bed to recliner with RW CGA (vc's for technique)  Ambulation/Gait Ambulation/Gait assistance: Supervision;Min guard Gait Distance (Feet): 200 Feet Assistive device: Rolling walker (2 wheeled) Gait Pattern/deviations: Step-through pattern;Decreased step length - right;Decreased step length - left;Trunk flexed Gait velocity: decreased   General Gait Details: steady with RW; vc's for sequencing and walker use   Stairs Stairs:  Yes Stairs assistance: Min guard;Supervision Stair Management: Two rails;With walker;Backwards;Forwards Number of Stairs: 8 General stair comments: forward x 1 with bilateral rails, backwards with walker and teaching with husband   Wheelchair Mobility    Modified Rankin (Stroke Patients Only)       Balance Overall balance assessment: Needs assistance Sitting-balance support: No upper extremity supported;Feet supported Sitting balance-Leahy Scale: Good Sitting balance - Comments: steady sitting reaching within BOS   Standing balance support: Bilateral upper extremity supported Standing balance-Leahy Scale: Good Standing balance comment: steady standing reaching within BOS                            Cognition Arousal/Alertness: Awake/alert Behavior During Therapy: WFL for tasks assessed/performed Overall Cognitive Status: Within Functional Limits for tasks assessed                                        Exercises Total Joint Exercises Ankle Circles/Pumps: AROM;Strengthening;Both;10 reps;Supine Quad Sets: AROM;Strengthening;Both;10 reps;Supine Short Arc Quad: AROM;Strengthening;Right;10 reps;Supine Heel Slides: AAROM;Strengthening;Right;10 reps;Supine Hip ABduction/ADduction: AAROM;Strengthening;Right;10 reps;Supine    General Comments General comments (skin integrity, edema, etc.): mild drainage noted R hip dressing      Pertinent Vitals/Pain Pain Assessment: Faces Pain Score: 7  Faces Pain Scale: Hurts a little bit Pain Location: R hip Pain Descriptors / Indicators: Sore Pain Intervention(s): Limited activity within patient's tolerance;Monitored during session;Repositioned    Home Living Family/patient expects to be discharged to:: Private residence Living Arrangements: Spouse/significant other Available Help at Discharge: Family Type of Home: [place]  Access: Stairs to enter Entrance Stairs-Rails: None Home Layout: One  level Home Equipment: None      Prior Function Level of Independence: Independent      Comments: Pt reports no falls in past 6 months   PT Goals (current goals can now be found in the care plan section) Acute Rehab PT Goals Patient Stated Goal: to go home and improve mobility PT Goal Formulation: With patient Time For Goal Achievement: 09/25/19 Potential to Achieve Goals: Good Progress towards PT goals: Progressing toward goals    Frequency    BID      PT Plan      Co-evaluation              AM-PAC PT "6 Clicks" Mobility   Outcome Measure  Help needed turning from your back to your side while in a flat bed without using bedrails?: None Help needed moving from lying on your back to sitting on the side of a flat bed without using bedrails?: A Little Help needed moving to and from a bed to a chair (including a wheelchair)?: A Little Help needed standing up from a chair using your arms (e.g., wheelchair or bedside chair)?: None Help needed to walk in hospital room?: A Little Help needed climbing 3-5 steps with a railing? : A Little 6 Click Score: 20    End of Session Equipment Utilized During Treatment: Gait belt Activity Tolerance: Patient tolerated treatment well Patient left: in chair;with call bell/phone within reach;with chair alarm set;with SCD's reapplied;Other (comment) (B heels floating via pillow support) Nurse Communication: Mobility status;Patient requests pain meds;Precautions;Weight bearing status PT Visit Diagnosis: Other abnormalities of gait and mobility (R26.89);Muscle weakness (generalized) (M62.81);Difficulty in walking, not elsewhere classified (R26.2);Pain Pain - Right/Left: Right Pain - part of body: Hip     Time: 1497-0263 PT Time Calculation (min) (ACUTE ONLY): 19 min  Charges:  $Gait Training: 8-22 mins $Therapeutic Exercise: 8-22 mins $Therapeutic Activity: 8-22 mins                    Chesley Noon, PTA 09/11/19, 1:39 PM

## 2019-09-11 NOTE — Evaluation (Signed)
Physical Therapy Evaluation Patient Details Name: Kim Gomez MRN: [id number] DOB: July 30, 1955 Today's Date: 09/11/2019   History of Present Illness  Pt is a 64 y.o. 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  PMH includes htn, anxiety, and depression.  Clinical Impression  Prior to hospital admission, pt was independent with ambulation; lives with her husband in 1 level home with 1 STE.  Currently pt is min assist semi-supine to sitting edge of bed; CGA with transfers; and CGA to ambulate 50 feet with RW.  Pain 4/10 R hip beginning of session at rest and 7/10 with ambulation and at rest end of session ("stiff" feeling)--pt requesting pain medication end of session--nurse notified  Pt would benefit from skilled PT to address noted impairments and functional limitations (see below for any additional details).  Upon hospital discharge, pt would benefit from OP PT (pt reports having OP PT appt set for August 30th).     Follow Up Recommendations Outpatient PT    Equipment Recommendations  Rolling walker with 5" wheels;3in1 (PT)    Recommendations for Other Services       Precautions / Restrictions Precautions Precautions: Fall;Anterior Hip Restrictions Weight Bearing Restrictions: Yes RLE Weight Bearing: Weight bearing as tolerated      Mobility  Bed Mobility Overal bed mobility: Needs Assistance Bed Mobility: Supine to Sit     Supine to sit: Min assist;HOB elevated     General bed mobility comments: assist for R LE; vc's for technique; mild increased time to perform  Transfers Overall transfer level: Needs assistance   Transfers: Sit to/from Stand;Stand Pivot Transfers Sit to Stand: Min guard Stand pivot transfers: Min guard       General transfer comment: x1 trial standing from bed and x1 trial standing from recliner (vc's for UE/LE placement and overall technique); increased effort to stand; stand step turn bed to recliner with RW CGA (vc's for  technique)  Ambulation/Gait Ambulation/Gait assistance: Min guard Gait Distance (Feet): 50 Feet Assistive device: Rolling walker (2 wheeled) Gait Pattern/deviations: Step-to pattern;Antalgic;Decreased stance time - right Gait velocity: decreased   General Gait Details: steady with RW; vc's for sequencing and walker use  Stairs            Wheelchair Mobility    Modified Rankin (Stroke Patients Only)       Balance Overall balance assessment: Needs assistance Sitting-balance support: No upper extremity supported;Feet supported Sitting balance-Leahy Scale: Good Sitting balance - Comments: steady sitting reaching within BOS   Standing balance support: No upper extremity supported Standing balance-Leahy Scale: Good Standing balance comment: steady standing reaching within BOS                             Pertinent Vitals/Pain Pain Assessment: 0-10 Pain Score: 7  Pain Location: R hip Pain Descriptors / Indicators: Other (Comment) (Stiff) Pain Intervention(s): Limited activity within patient's tolerance;Monitored during session;Premedicated before session;Repositioned;Patient requesting pain meds-RN notified  Vitals (HR and O2 on room air) stable and WFL throughout treatment session.    Home Living Family/patient expects to be discharged to:: Private residence Living Arrangements: Spouse/significant other Available Help at Discharge: Family Type of Home: House Home Access: Stairs to enter Entrance Stairs-Rails: None Entrance Stairs-Number of Steps: 1 Home Layout: One level Home Equipment: None      Prior Function Level of Independence: Independent         Comments: Pt reports no falls in past 6 months  Hand Dominance        Extremity/Trunk Assessment   Upper Extremity Assessment Upper Extremity Assessment: Overall WFL for tasks assessed    Lower Extremity Assessment Lower Extremity Assessment: RLE deficits/detail (L LE WFL) RLE  Deficits / Details: good R quad set strength; at least 3-/5 R hip flexion; at least 3/5 R knee flexion/extension and DF/PF AROM RLE: Unable to fully assess due to pain    Cervical / Trunk Assessment Cervical / Trunk Assessment: Normal  Communication   Communication: No difficulties  Cognition Arousal/Alertness: Awake/alert Behavior During Therapy: WFL for tasks assessed/performed Overall Cognitive Status: Within Functional Limits for tasks assessed                                        General Comments General comments (skin integrity, edema, etc.): mild drainage noted R hip dressing.  Pt agreeable to PT session.    Exercises Total Joint Exercises Ankle Circles/Pumps: AROM;Strengthening;Both;10 reps;Supine Quad Sets: AROM;Strengthening;Both;10 reps;Supine Short Arc Quad: AROM;Strengthening;Right;10 reps;Supine Heel Slides: AAROM;Strengthening;Right;10 reps;Supine Hip ABduction/ADduction: AAROM;Strengthening;Right;10 reps;Supine   Assessment/Plan    PT Assessment Patient needs continued PT services  PT Problem List Decreased strength;Decreased activity tolerance;Decreased balance;Decreased mobility;Decreased knowledge of use of DME;Decreased knowledge of precautions;Pain;Decreased skin integrity       PT Treatment Interventions DME instruction;Gait training;Stair training;Functional mobility training;Therapeutic activities;Therapeutic exercise;Balance training;Patient/family education    PT Goals (Current goals can be found in the Care Plan section)  Acute Rehab PT Goals Patient Stated Goal: to go home and improve mobility PT Goal Formulation: With patient Time For Goal Achievement: 09/25/19 Potential to Achieve Goals: Good    Frequency BID   Barriers to discharge        Co-evaluation               AM-PAC PT "6 Clicks" Mobility  Outcome Measure Help needed turning from your back to your side while in a flat bed without using bedrails?:  None Help needed moving from lying on your back to sitting on the side of a flat bed without using bedrails?: A Little Help needed moving to and from a bed to a chair (including a wheelchair)?: A Little Help needed standing up from a chair using your arms (e.g., wheelchair or bedside chair)?: A Little Help needed to walk in hospital room?: A Little Help needed climbing 3-5 steps with a railing? : A Little 6 Click Score: 19    End of Session Equipment Utilized During Treatment: Gait belt Activity Tolerance: Patient tolerated treatment well Patient left: in chair;with call bell/phone within reach;with chair alarm set;with SCD's reapplied;Other (comment) (B heels floating via pillow support) Nurse Communication: Mobility status;Patient requests pain meds;Precautions;Weight bearing status PT Visit Diagnosis: Other abnormalities of gait and mobility (R26.89);Muscle weakness (generalized) (M62.81);Difficulty in walking, not elsewhere classified (R26.2);Pain Pain - Right/Left: Right Pain - part of body: Hip    Time: 7001-7494 PT Time Calculation (min) (ACUTE ONLY): 49 min   Charges:   PT Evaluation $PT Eval Low Complexity: 1 Low PT Treatments $Therapeutic Exercise: 8-22 mins $Therapeutic Activity: 8-22 mins       Luzelena Heeg, PT 09/11/19, 10:14 AM

## 2019-09-12 DIAGNOSIS — M1611 Unilateral primary osteoarthritis, right hip: Secondary | ICD-10-CM | POA: Diagnosis not present

## 2019-09-12 LAB — CBC
HCT: 29.4 % — ABNORMAL LOW (ref 36.0–46.0)
Hemoglobin: 9.7 g/dL — ABNORMAL LOW (ref 12.0–15.0)
MCH: 30.7 pg (ref 26.0–34.0)
MCHC: 33 g/dL (ref 30.0–36.0)
MCV: 93 fL (ref 80.0–100.0)
Platelets: 150 10*3/uL (ref 150–400)
RBC: 3.16 MIL/uL — ABNORMAL LOW (ref 3.87–5.11)
RDW: 13.7 % (ref 11.5–15.5)
WBC: 11.2 10*3/uL — ABNORMAL HIGH (ref 4.0–10.5)
nRBC: 0 % (ref 0.0–0.2)

## 2019-09-12 MED ORDER — ASPIRIN 81 MG PO CHEW
81.0000 mg | CHEWABLE_TABLET | Freq: Two times a day (BID) | ORAL | 0 refills | Status: DC
Start: 2019-09-12 — End: 2020-08-12

## 2019-09-12 MED ORDER — HYDROCODONE-ACETAMINOPHEN 7.5-325 MG PO TABS: 1.0000 | ORAL_TABLET | ORAL | 0 refills | Status: DC | PRN

## 2019-09-12 MED ORDER — METHOCARBAMOL 500 MG PO TABS
500.0000 mg | ORAL_TABLET | Freq: Four times a day (QID) | ORAL | 0 refills | Status: DC | PRN
Start: 2019-09-12 — End: 2020-08-11

## 2019-09-12 MED ORDER — ONDANSETRON HCL 4 MG PO TABS: 4.0000 mg | ORAL_TABLET | Freq: Four times a day (QID) | ORAL | 0 refills | Status: DC | PRN

## 2019-09-12 MED ORDER — DOCUSATE SODIUM 100 MG PO CAPS
100.0000 mg | ORAL_CAPSULE | Freq: Two times a day (BID) | ORAL | 0 refills | Status: DC
Start: 2019-09-12 — End: 2020-08-11

## 2019-09-12 NOTE — Progress Notes (Signed)
  Subjective:  Patient reports pain as mild to moderate.    Objective:   VITALS:   Vitals:   09/11/19 1757 09/11/19 2007 09/12/19 0047 09/12/19 0506  BP: (!) 155/76 137/72 125/71 131/65  Pulse: 94 88 93 99  Resp: _0 Temp: 97.8 F (36.6 C) 98.2 F (36.8 C) (!) 97.4 F (36.3 C) 98.8 F (37.1 C)  TempSrc: Oral Oral Oral Oral  SpO2: 98% 93% 93% 98%  Weight:      Height:        PHYSICAL EXAM:  Neurologically intact ABD soft Neurovascular intact Sensation intact distally Intact pulses distally Dorsiflexion/Plantar flexion intact Incision: dressing C/D/I No cellulitis present Compartment soft  LABS  Results for orders placed or performed during the hospital encounter of 09/10/19 (from the past 24 hour(s))  CBC     Status: Abnormal   Collection Time: 09/12/19  5:53 AM  Result Value Ref Range   WBC 11.2 (H) 4.0 - 10.5 K/uL   RBC 3.16 (L) 3.87 - 5.11 MIL/uL   Hemoglobin 9.7 (L) 12.0 - 15.0 g/dL   HCT 29.4 (L) 36 - 46 %   MCV 93.0 80.0 - 100.0 fL   MCH 30.7 26.0 - 34.0 pg   MCHC 33.0 30.0 - 36.0 g/dL   RDW 13.7 11.5 - 15.5 %   Platelets 150 150 - 400 K/uL   nRBC 0.0 0.0 - 0.2 %    DG HIP OPERATIVE UNILAT W OR W/O PELVIS RIGHT  Result Date: 09/10/2019 CLINICAL DATA:  Right hip arthroplasty EXAM: OPERATIVE RIGHT HIP WITH PELVIS COMPARISON:  None. FLUOROSCOPY TIME:  Radiation Exposure Index (as provided by the fluoroscopic device): 1.96 mGy If the device does not provide the exposure index: Fluoroscopy Time:  8 seconds Number of Acquired Images:  4 FINDINGS: Initial images demonstrate a cannula extending into the right femoral neck. Subsequently acetabular component is placed with a spacer device in the femur. This is subsequently exchanged for the final prosthesis. No fracture or dislocation is seen. IMPRESSION: Right hip replacement. Electronically Signed   By: Inez Catalina M.D.   On: 09/10/2019 16:36    Assessment/Plan: 2 Days Post-Op   Active Problems:    History of total hip replacement, right   Advance diet Up with therapy  Discharge today if PT goals met.  Follow up in 2 weeks for staple removal.  Call office for appointment [place] , PA-C 09/12/2019, 7:07 AM

## 2019-09-12 NOTE — Progress Notes (Signed)
Per Ortho PA, no BM before discharge

## 2019-09-12 NOTE — Discharge Instructions (Signed)

## 2019-09-12 NOTE — Discharge Summary (Signed)
Physician Discharge Summary  Patient ID: MELANIA KIRKS MRN: [id number] DOB/AGE: 22-Dec-1955 64 y.o.  Admit date: 09/10/2019 Discharge date: 09/12/2019  Admission Diagnoses:  M16.11 Unilateral primary osteoarthritis, right hip <principal problem not specified>  Discharge Diagnoses:  M16.11 Unilateral primary osteoarthritis, right hip Active Problems:   History of total hip replacement, right   Past Medical History:  Diagnosis Date   Anxiety    Arthritis    legs, hands   Cellulitis of right knee    started on antibiotics 06/21/15.     Depression    Family history of adverse reaction to anesthesia    sister and mom -PONV   Hypercholesteremia    Hypertension     Surgeries: Procedure(s): TOTAL HIP ARTHROPLASTY ANTERIOR APPROACH on 09/10/2019   Consultants (if any):   Discharged Condition: Improved  Hospital Course: DI JASMER is an 64 y.o. female who was admitted 09/10/2019 with a diagnosis of  M16.11 Unilateral primary osteoarthritis, right hip <principal problem not specified> and went to the operating room on 09/10/2019 and underwent the above named procedures.    She was given perioperative antibiotics:  Anti-infectives (From admission, onward)   Start     Dose/Rate Route Frequency Ordered Stop   09/10/19 2100  ceFAZolin (ANCEF) IVPB 2g/100 mL premix        2 g 200 mL/hr over 30 Minutes Intravenous Every 6 hours 09/10/19 1818 09/11/19 0429   09/10/19 1624  50,000 units bacitracin in 0.9% normal saline 250 mL irrigation  Status:  Discontinued          As needed 09/10/19 1624 09/10/19 1636   09/10/19 1200  ceFAZolin (ANCEF) IVPB 2g/100 mL premix        2 g 200 mL/hr over 30 Minutes Intravenous On call to O.R. 09/10/19 1150 09/10/19 1510   09/10/19 1155  ceFAZolin (ANCEF) 2-4 GM/100ML-% IVPB       Note to Pharmacy: Leonia Reader   : cabinet override      09/10/19 1155 09/10/19 1510    .  She was given sequential compression devices, early ambulation,  and Aspirin for DVT prophylaxis.  She benefited maximally from the hospital stay and there were no complications.    Recent vital signs:  Vitals:   09/12/19 0047 09/12/19 0506  BP: 125/71 131/65  Pulse: 93 99  Resp: 19 19  Temp: (!) 97.4 F (36.3 C) 98.8 F (37.1 C)  SpO2: 93% 98%    Recent laboratory studies:  Lab Results  Component Value Date   HGB 9.7 (L) 09/12/2019   HGB 10.7 (L) 09/11/2019   HGB 12.1 08/31/2019   Lab Results  Component Value Date   WBC 11.2 (H) 09/12/2019   PLT 150 09/12/2019   Lab Results  Component Value Date   INR 1.0 08/31/2019   Lab Results  Component Value Date   NA 138 09/11/2019   K 3.1 (L) 09/11/2019   CL 103 09/11/2019   CO2 26 09/11/2019   BUN 14 09/11/2019   CREATININE 0.84 09/11/2019   GLUCOSE 131 (H) 09/11/2019    Discharge Medications:   Allergies as of 09/12/2019   No Known Allergies     Medication List    STOP taking these medications   HYDROcodone-acetaminophen 5-325 MG tablet Commonly known as: NORCO/VICODIN Replaced by: HYDROcodone-acetaminophen 7.5-325 MG tablet   meloxicam 15 MG tablet Commonly known as: MOBIC     TAKE these medications   amLODipine 2.5 MG tablet Commonly known as: NORVASC Take 2.5  mg by mouth daily.   aspirin 81 MG chewable tablet Chew 1 tablet (81 mg total) by mouth 2 (two) times daily.   CALCIUM 500 +D PO Take 1 tablet by mouth daily.   docusate sodium 100 MG capsule Commonly known as: COLACE Take 1 capsule (100 mg total) by mouth 2 (two) times daily.   escitalopram 20 MG tablet Commonly known as: LEXAPRO Take 20 mg by mouth daily.   estradiol 1 MG tablet Commonly known as: ESTRACE Take 1 mg by mouth daily.   HYDROcodone-acetaminophen 7.5-325 MG tablet Commonly known as: NORCO Take 1-2 tablets by mouth every 4 (four) hours as needed for severe pain (pain). Replaces: HYDROcodone-acetaminophen 5-325 MG tablet   lovastatin 20 MG tablet Commonly known as: MEVACOR Take 20  mg by mouth daily.   medroxyPROGESTERone 10 MG tablet Commonly known as: PROVERA Take 5 mg by mouth at bedtime.   methocarbamol 500 MG tablet Commonly known as: ROBAXIN Take 1 tablet (500 mg total) by mouth every 6 (six) hours as needed for muscle spasms. What changed: when to take this   ondansetron 4 MG tablet Commonly known as: ZOFRAN Take 1 tablet (4 mg total) by mouth every 6 (six) hours as needed for nausea.   traZODone 100 MG tablet Commonly known as: DESYREL Take 50 mg by mouth at bedtime.            Durable Medical Equipment  (From admission, onward)         Start     Ordered   09/12/19 0713  For home use only DME 3 n 1  Once        09/12/19 0932   09/12/19 0713  For home use only DME Walker rolling  Once       Question Answer Comment  Walker: With 5 Inch Wheels   Patient needs a walker to treat with the following condition Osteoarthritis of right hip      09/12/19 3557   09/10/19 1819  DME Walker rolling  Once       Question:  Patient needs a walker to treat with the following condition  Answer:  History of total hip replacement, right   09/10/19 1818   09/10/19 1819  DME 3 n 1  Once        09/10/19 1818   09/10/19 1819  DME Bedside commode  Once       Question:  Patient needs a bedside commode to treat with the following condition  Answer:  History of total hip replacement, right   09/10/19 1818          Diagnostic Studies: DG HIP OPERATIVE UNILAT W OR W/O PELVIS RIGHT  Result Date: 09/10/2019 CLINICAL DATA:  Right hip arthroplasty EXAM: OPERATIVE RIGHT HIP WITH PELVIS COMPARISON:  None. FLUOROSCOPY TIME:  Radiation Exposure Index (as provided by the fluoroscopic device): 1.96 mGy If the device does not provide the exposure index: Fluoroscopy Time:  8 seconds Number of Acquired Images:  4 FINDINGS: Initial images demonstrate a cannula extending into the right femoral neck. Subsequently acetabular component is placed with a spacer device in the femur.  This is subsequently exchanged for the final prosthesis. No fracture or dislocation is seen. IMPRESSION: Right hip replacement. Electronically Signed   By: Inez Catalina M.D.   On: 09/10/2019 16:36    Disposition: Discharge disposition: 01-Home or Self Care      Discharge today if PT goals met.  Follow up in 2 weeks for staple  removal.  Call office for appointment [phone]      Signed: Carlynn Spry ,PA-C 09/12/2019, 7:13 AM

## 2019-09-12 NOTE — Progress Notes (Signed)
Physical Therapy Treatment Patient Details Name: Kim Gomez MRN: [id number] DOB: May 06, 1955 Today's Date: 09/12/2019    History of Present Illness Pt is a 64 y.o. 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  PMH includes htn, anxiety, and depression.    PT Comments    Completed lap and stair training with walker and ease.  No further mobility concerns or questions raised.  Follow Up Recommendations  Outpatient PT     Equipment Recommendations  Rolling walker with 5" wheels;3in1 (PT)    Recommendations for Other Services       Precautions / Restrictions Precautions Precautions: Fall;Anterior Hip Restrictions Weight Bearing Restrictions: Yes RLE Weight Bearing: Weight bearing as tolerated    Mobility  Bed Mobility               General bed mobility comments: in recliner before and after session  Transfers Overall transfer level: Needs assistance Equipment used: Rolling walker (2 wheeled) Transfers: Sit to/from Stand Sit to Stand: Modified independent (Device/Increase time)            Ambulation/Gait Ambulation/Gait assistance: Supervision;Modified independent (Device/Increase time)   Assistive device: Rolling walker (2 wheeled) Gait Pattern/deviations: Step-through pattern;Decreased step length - right;Decreased step length - left;Trunk flexed Gait velocity: decreased   General Gait Details: steady with RW; vc's for sequencing and walker use   Stairs Stairs: Yes Stairs assistance: Min guard;Supervision Stair Management: With walker;Backwards Number of Stairs: 3 General stair comments: good recall of staris.  +1 assist   Wheelchair Mobility    Modified Rankin (Stroke Patients Only)       Balance Overall balance assessment: Needs assistance Sitting-balance support: No upper extremity supported;Feet supported Sitting balance-Leahy Scale: Good     Standing balance support: Bilateral upper extremity supported Standing balance-Leahy Scale:  Good                              Cognition Arousal/Alertness: Awake/alert Behavior During Therapy: WFL for tasks assessed/performed Overall Cognitive Status: Within Functional Limits for tasks assessed                                        Exercises      General Comments        Pertinent Vitals/Pain Pain Assessment: Faces Faces Pain Scale: Hurts a little bit Pain Location: R hip Pain Descriptors / Indicators: Sore Pain Intervention(s): Limited activity within patient's tolerance;Premedicated before session;Monitored during session    Home Living                      Prior Function            PT Goals (current goals can now be found in the care plan section) Progress towards PT goals: Progressing toward goals    Frequency    BID      PT Plan      Co-evaluation              AM-PAC PT "6 Clicks" Mobility   Outcome Measure  Help needed turning from your back to your side while in a flat bed without using bedrails?: None Help needed moving from lying on your back to sitting on the side of a flat bed without using bedrails?: A Little Help needed moving to and from a bed to a chair (including a wheelchair)?:  None Help needed standing up from a chair using your arms (e.g., wheelchair or bedside chair)?: None Help needed to walk in hospital room?: A Little Help needed climbing 3-5 steps with a railing? : A Little 6 Click Score: 21    End of Session Equipment Utilized During Treatment: Gait belt Activity Tolerance: Patient tolerated treatment well Patient left: in chair;with call bell/phone within reach;with chair alarm set;with SCD's reapplied;Other (comment) (B heels floating via pillow support) Nurse Communication: Mobility status;Patient requests pain meds;Precautions;Weight bearing status PT Visit Diagnosis: Other abnormalities of gait and mobility (R26.89);Muscle weakness (generalized) (M62.81);Difficulty in  walking, not elsewhere classified (R26.2);Pain Pain - Right/Left: Right Pain - part of body: Hip     Time: 1884-1660 PT Time Calculation (min) (ACUTE ONLY): 12 min  Charges:  $Gait Training: 8-22 mins                    Chesley Noon, PTA 09/12/19, 10:18 AM

## 2019-09-17 ENCOUNTER — Ambulatory Visit
Admission: RE | Admit: 2019-09-17 | Discharge: 2019-09-17 | Disposition: A | Discharge status: Home / Self Care | Payer: BC Managed Care – PPO | Source: Ambulatory Visit | Attending: Student | Admitting: Student

## 2019-09-17 ENCOUNTER — Other Ambulatory Visit: Payer: Self-pay | Admitting: Student

## 2019-09-17 ENCOUNTER — Other Ambulatory Visit: Payer: Self-pay

## 2019-09-17 ENCOUNTER — Other Ambulatory Visit: Payer: Self-pay | Admitting: Orthopedic Surgery

## 2019-09-17 DIAGNOSIS — R2241 Localized swelling, mass and lump, right lower limb: Secondary | ICD-10-CM

## 2019-09-17 DIAGNOSIS — Z96641 Presence of right artificial hip joint: Secondary | ICD-10-CM | POA: Insufficient documentation

## 2020-08-10 ENCOUNTER — Inpatient Hospital Stay: Payer: No Typology Code available for payment source

## 2020-08-10 ENCOUNTER — Other Ambulatory Visit: Payer: Self-pay

## 2020-08-10 ENCOUNTER — Emergency Department: Payer: No Typology Code available for payment source

## 2020-08-10 ENCOUNTER — Inpatient Hospital Stay
Admission: EM | Admit: 2020-08-10 | Discharge: 2020-08-12 | DRG: 066 | Disposition: A | Discharge status: Home / Self Care | Payer: No Typology Code available for payment source | Attending: Internal Medicine | Admitting: Internal Medicine

## 2020-08-10 DIAGNOSIS — Z7982 Long term (current) use of aspirin: Secondary | ICD-10-CM | POA: Diagnosis not present

## 2020-08-10 DIAGNOSIS — E7800 Pure hypercholesterolemia, unspecified: Secondary | ICD-10-CM | POA: Diagnosis present

## 2020-08-10 DIAGNOSIS — R29701 NIHSS score 1: Secondary | ICD-10-CM | POA: Diagnosis present

## 2020-08-10 DIAGNOSIS — Z79899 Other long term (current) drug therapy: Secondary | ICD-10-CM

## 2020-08-10 DIAGNOSIS — M542 Cervicalgia: Secondary | ICD-10-CM

## 2020-08-10 DIAGNOSIS — I639 Cerebral infarction, unspecified: Secondary | ICD-10-CM

## 2020-08-10 DIAGNOSIS — U071 COVID-19: Secondary | ICD-10-CM

## 2020-08-10 DIAGNOSIS — Z8616 Personal history of COVID-19: Secondary | ICD-10-CM | POA: Diagnosis not present

## 2020-08-10 DIAGNOSIS — Z9114 Patient's other noncompliance with medication regimen: Secondary | ICD-10-CM

## 2020-08-10 DIAGNOSIS — E876 Hypokalemia: Secondary | ICD-10-CM | POA: Diagnosis present

## 2020-08-10 DIAGNOSIS — I6381 Other cerebral infarction due to occlusion or stenosis of small artery: Principal | ICD-10-CM | POA: Diagnosis present

## 2020-08-10 DIAGNOSIS — R471 Dysarthria and anarthria: Secondary | ICD-10-CM | POA: Diagnosis present

## 2020-08-10 DIAGNOSIS — I10 Essential (primary) hypertension: Secondary | ICD-10-CM | POA: Diagnosis present

## 2020-08-10 DIAGNOSIS — Z6830 Body mass index (BMI) 30.0-30.9, adult: Secondary | ICD-10-CM | POA: Diagnosis not present

## 2020-08-10 DIAGNOSIS — Z90710 Acquired absence of both cervix and uterus: Secondary | ICD-10-CM | POA: Diagnosis not present

## 2020-08-10 DIAGNOSIS — F419 Anxiety disorder, unspecified: Secondary | ICD-10-CM | POA: Diagnosis present

## 2020-08-10 DIAGNOSIS — Z9119 Patient's noncompliance with other medical treatment and regimen: Secondary | ICD-10-CM

## 2020-08-10 DIAGNOSIS — Z96641 Presence of right artificial hip joint: Secondary | ICD-10-CM | POA: Diagnosis present

## 2020-08-10 DIAGNOSIS — R42 Dizziness and giddiness: Secondary | ICD-10-CM

## 2020-08-10 DIAGNOSIS — R1310 Dysphagia, unspecified: Secondary | ICD-10-CM | POA: Diagnosis present

## 2020-08-10 DIAGNOSIS — F32A Depression, unspecified: Secondary | ICD-10-CM | POA: Diagnosis present

## 2020-08-10 DIAGNOSIS — E669 Obesity, unspecified: Secondary | ICD-10-CM | POA: Diagnosis present

## 2020-08-10 DIAGNOSIS — I6389 Other cerebral infarction: Secondary | ICD-10-CM | POA: Diagnosis not present

## 2020-08-10 LAB — CBC WITH DIFFERENTIAL/PLATELET
Abs Immature Granulocytes: 0.04 10*3/uL (ref 0.00–0.07)
Basophils Absolute: 0.1 10*3/uL (ref 0.0–0.1)
Basophils Relative: 1 %
Eosinophils Absolute: 0.2 10*3/uL (ref 0.0–0.5)
Eosinophils Relative: 3 %
HCT: 38.9 % (ref 36.0–46.0)
Hemoglobin: 13.2 g/dL (ref 12.0–15.0)
Immature Granulocytes: 1 %
Lymphocytes Relative: 32 %
Lymphs Abs: 2.3 10*3/uL (ref 0.7–4.0)
MCH: 30.8 pg (ref 26.0–34.0)
MCHC: 33.9 g/dL (ref 30.0–36.0)
MCV: 90.9 fL (ref 80.0–100.0)
Monocytes Absolute: 0.6 10*3/uL (ref 0.1–1.0)
Monocytes Relative: 9 %
Neutro Abs: 3.9 10*3/uL (ref 1.7–7.7)
Neutrophils Relative %: 54 %
Platelets: 199 10*3/uL (ref 150–400)
RBC: 4.28 MIL/uL (ref 3.87–5.11)
RDW: 13.4 % (ref 11.5–15.5)
WBC: 7.2 10*3/uL (ref 4.0–10.5)
nRBC: 0 % (ref 0.0–0.2)

## 2020-08-10 LAB — PROTIME-INR
INR: 1 (ref 0.8–1.2)
Prothrombin Time: 13.4 seconds (ref 11.4–15.2)

## 2020-08-10 LAB — URINALYSIS, COMPLETE (UACMP) WITH MICROSCOPIC
Bilirubin Urine: NEGATIVE
Glucose, UA: NEGATIVE mg/dL
Hgb urine dipstick: NEGATIVE
Ketones, ur: NEGATIVE mg/dL
Leukocytes,Ua: NEGATIVE
Nitrite: NEGATIVE
Protein, ur: NEGATIVE mg/dL
Specific Gravity, Urine: 1.01 (ref 1.005–1.030)
pH: 8 (ref 5.0–8.0)

## 2020-08-10 LAB — COMPREHENSIVE METABOLIC PANEL
ALT: 18 U/L (ref 0–44)
AST: 19 U/L (ref 15–41)
Albumin: 3.9 g/dL (ref 3.5–5.0)
Alkaline Phosphatase: 56 U/L (ref 38–126)
Anion gap: 7 (ref 5–15)
BUN: 13 mg/dL (ref 8–23)
CO2: 27 mmol/L (ref 22–32)
Calcium: 9.2 mg/dL (ref 8.9–10.3)
Chloride: 103 mmol/L (ref 98–111)
Creatinine, Ser: 0.84 mg/dL (ref 0.44–1.00)
GFR, Estimated: >60 mL/min (ref >60)
Glucose, Bld: 96 mg/dL (ref 70–99)
Potassium: 3.2 mmol/L — ABNORMAL LOW (ref 3.5–5.1)
Sodium: 137 mmol/L (ref 135–145)
Total Bilirubin: 0.5 mg/dL (ref 0.3–1.2)
Total Protein: 7.5 g/dL (ref 6.5–8.1)

## 2020-08-10 LAB — RESP PANEL BY RT-PCR (FLU A&B, COVID) ARPGX2
Influenza A by PCR: NEGATIVE
Influenza B by PCR: NEGATIVE
SARS Coronavirus 2 by RT PCR: POSITIVE — AB

## 2020-08-10 LAB — TROPONIN I (HIGH SENSITIVITY)
Troponin I (High Sensitivity): 3 ng/L (ref <18)
Troponin I (High Sensitivity): 5 ng/L (ref <18)

## 2020-08-10 MED ORDER — SODIUM CHLORIDE 0.9 % IV BOLUS
1000.0000 mL | Freq: Once | INTRAVENOUS | Status: AC
  Administered 2020-08-10: 1000 mL via INTRAVENOUS

## 2020-08-10 MED ORDER — HYDRALAZINE HCL 20 MG/ML IJ SOLN
5.0000 mg | Freq: Four times a day (QID) | INTRAMUSCULAR | Status: DC | PRN
  Filled 2020-08-10: qty 1

## 2020-08-10 MED ORDER — SODIUM CHLORIDE 0.9 % IV BOLUS
500.0000 mL | Freq: Once | INTRAVENOUS | Status: AC
  Administered 2020-08-10: 500 mL via INTRAVENOUS

## 2020-08-10 MED ORDER — KETOROLAC TROMETHAMINE 30 MG/ML IJ SOLN
30.0000 mg | Freq: Once | INTRAMUSCULAR | Status: AC
  Administered 2020-08-10: 30 mg via INTRAVENOUS
  Filled 2020-08-10: qty 1

## 2020-08-10 MED ORDER — CLOPIDOGREL BISULFATE 75 MG PO TABS
75.0000 mg | ORAL_TABLET | Freq: Every day | ORAL | Status: DC
  Administered 2020-08-10 – 2020-08-12 (×3): 75 mg via ORAL
  Filled 2020-08-10 (×3): qty 1

## 2020-08-10 MED ORDER — POTASSIUM CHLORIDE CRYS ER 20 MEQ PO TBCR
20.0000 meq | EXTENDED_RELEASE_TABLET | ORAL | Status: AC
  Administered 2020-08-10 – 2020-08-11 (×2): 20 meq via ORAL
  Filled 2020-08-10 (×2): qty 1

## 2020-08-10 NOTE — ED Notes (Signed)
See triage note  Presents with some neck pain   States she was dx'd with COVID last weds  Pt is currently afebrile   States pain is worse on the right

## 2020-08-10 NOTE — ED Provider Notes (Addendum)
[facility] Emergency Department Provider Note  ____________________________________________   Event Date/Time   First MD Initiated Contact with Patient 08/10/20 1257     (approximate)  I have reviewed the triage vital signs and the nursing notes.   HISTORY  Chief Complaint Neck Pain    HPI Kim Gomez is a 65 y.o. female presents emergency department stating that she tested positive for COVID on Wednesday.  Patient states that she has been feeling very weak and had right-sided neck pain that started today.  Patient states she has been very dizzy and feels like it is more one-sided.  She denies chest pain or shortness of breath.  Patient takes citalopram, estrogen, Norvasc, and lovastatin and a medication to help her sleep  Past Medical History:  Diagnosis Date   Anxiety    Arthritis    legs, hands   Cellulitis of right knee    started on antibiotics 06/21/15.     Depression    Family history of adverse reaction to anesthesia    sister and mom -PONV   Hypercholesteremia    Hypertension     Patient Active Problem List   Diagnosis Date Noted   History of total hip replacement, right 09/10/2019   Special screening for malignant neoplasms, colon    Benign neoplasm of ascending colon    Benign neoplasm of descending colon     Past Surgical History:  Procedure Laterality Date   ABDOMINAL HYSTERECTOMY     COLONOSCOPY WITH PROPOFOL N/A 06/27/2015   Procedure: COLONOSCOPY WITH PROPOFOL;  Surgeon: Lucilla Lame, MD;  Location: [place];  Service: Endoscopy;  Laterality: N/A;   POLYPECTOMY  06/27/2015   Procedure: POLYPECTOMY;  Surgeon: Lucilla Lame, MD;  Location: [place];  Service: Endoscopy;;   TOTAL HIP ARTHROPLASTY Right 09/10/2019   Procedure: TOTAL HIP ARTHROPLASTY ANTERIOR APPROACH;  Surgeon: Lovell Sheehan, MD;  Location: [facility];  Service: Orthopedics;  Laterality: Right;    Prior to Admission medications    Medication Sig Start Date End Date Taking? Authorizing Provider  amLODipine (NORVASC) 2.5 MG tablet Take 2.5 mg by mouth daily.    [provider]  aspirin 81 MG chewable tablet Chew 1 tablet (81 mg total) by mouth 2 (two) times daily. 09/12/19   Carlynn Spry, PA-C  Calcium Carb-Cholecalciferol (CALCIUM 500 +D PO) Take 1 tablet by mouth daily.     [provider]  docusate sodium (COLACE) 100 MG capsule Take 1 capsule (100 mg total) by mouth 2 (two) times daily. 09/12/19   Carlynn Spry, PA-C  escitalopram (LEXAPRO) 20 MG tablet Take 20 mg by mouth daily.    [provider]  estradiol (ESTRACE) 1 MG tablet Take 1 mg by mouth daily.     [provider]  HYDROcodone-acetaminophen (NORCO) 7.5-325 MG tablet Take 1-2 tablets by mouth every 4 (four) hours as needed for severe pain (pain). 09/12/19   Carlynn Spry, PA-C  lovastatin (MEVACOR) 20 MG tablet Take 20 mg by mouth daily.     [provider]  medroxyPROGESTERone (PROVERA) 10 MG tablet Take 5 mg by mouth at bedtime.     [provider]  methocarbamol (ROBAXIN) 500 MG tablet Take 1 tablet (500 mg total) by mouth every 6 (six) hours as needed for muscle spasms. 09/12/19   Carlynn Spry, PA-C  ondansetron (ZOFRAN) 4 MG tablet Take 1 tablet (4 mg total) by mouth every 6 (six) hours as needed for nausea. 09/12/19  Carlynn Spry, PA-C  traZODone (DESYREL) 100 MG tablet Take 50 mg by mouth at bedtime.     [provider]    Allergies Patient has no known allergies.  No family history on file.  Social History Social History   Tobacco Use   Smoking status: Never   Smokeless tobacco: Never  Vaping Use   Vaping Use: Never used  Substance Use Topics   Alcohol use: No   Drug use: Never    Review of Systems  Constitutional: No fever/chills Eyes: No visual changes. ENT: No sore throat. Respiratory: Denies cough Cardiovascular: Denies chest pain Gastrointestinal: Denies  abdominal pain Genitourinary: Negative for dysuria. Musculoskeletal: Negative for back pain. Skin: Negative for rash. Psychiatric: no mood changes,     ____________________________________________   PHYSICAL EXAM:  VITAL SIGNS: ED Triage Vitals [08/10/20 1200]  Enc Vitals Group     BP (!) 181/95     Pulse Rate 79     Resp 18     Temp 98 F (36.7 C)     Temp Source Oral     SpO2 99 %     Weight 175 lb (79.4 kg)     Height '5\' 4"'$  (1.626 m)     Head Circumference      Peak Flow      Pain Score 8     Pain Loc      Pain Edu?      Excl. in Nitro?     Constitutional: Alert and oriented. Well appearing and in no acute distress. Eyes: Conjunctivae are normal.  PERRL, 2 beats nystagmus bilaterally Head: Atraumatic. Nose: No congestion/rhinnorhea. Mouth/Throat: Mucous membranes are moist.   Neck:  supple no lymphadenopathy noted Cardiovascular: Normal rate, regular rhythm. Heart sounds are normal Respiratory: Normal respiratory effort.  No retractions, lungs c t a  Abd: soft nontender bs normal all 4 quad GU: deferred Musculoskeletal: FROM all extremities, warm and well perfused Neurologic:  Normal speech and language.  Cranial nerves II through XII grossly intact Skin:  Skin is warm, dry and intact. No rash noted. Psychiatric: Mood and affect are normal. Speech and behavior are normal.  ____________________________________________   LABS (all labs ordered are listed, but only abnormal results are displayed)  Labs Reviewed  COMPREHENSIVE METABOLIC PANEL - Abnormal; Notable for the following components:      Result Value   Potassium 3.2 (*)    All other components within normal limits  URINALYSIS, COMPLETE (UACMP) WITH MICROSCOPIC - Abnormal; Notable for the following components:   Color, Urine YELLOW (*)    APPearance HAZY (*)    Bacteria, UA FEW (*)    All other components within normal limits  CBC WITH DIFFERENTIAL/PLATELET  TROPONIN I (HIGH SENSITIVITY)  TROPONIN  I (HIGH SENSITIVITY)   ____________________________________________   ____________________________________________  RADIOLOGY  MRI of the brain Chest x-ray  ____________________________________________   PROCEDURES  Procedure(s) performed: No  Procedures    ____________________________________________   INITIAL IMPRESSION / ASSESSMENT AND PLAN / ED COURSE  Pertinent labs & imaging results that were available during my care of the patient were reviewed by me and considered in my medical decision making (see chart for details).   Patient 65 year old female who recently tested positive for COVID who presents with weakness and dizziness.  See HPI.  Physical exam shows patient per stable this time  DDx: COVID, dehydration, vertigo CVA, SAH, subdural  Labs are reassuring, CBC, comprehensive metabolic panel and troponin are all normal  EKg shows  nsr, see physician read Chest x-ray reviewed by me confirmed by radiology to be negative  Patient was given 1 L normal saline.  She is still complaining of feeling very lightheaded.  Will order MRI of the brain   Care transferred to jenis bacon-menshew, PA-C  Kim Gomez was evaluated in Emergency Department on 08/10/2020 for the symptoms described in the history of present illness. She was evaluated in the context of the global COVID-19 pandemic, which necessitated consideration that the patient might be at risk for infection with the SARS-CoV-2 virus that causes COVID-19. Institutional protocols and algorithms that pertain to the evaluation of patients at risk for COVID-19 are in a state of rapid change based on information released by regulatory bodies including the CDC and federal and state organizations. These policies and algorithms were followed during the patient's care in the ED.    As part of my medical decision making, I reviewed the following data within the [place] History obtained from family,  Nursing notes reviewed and incorporated, Labs reviewed , EKG interpreted NSR, Old chart reviewed, Radiograph reviewed , Notes from prior ED visits, and [place] Controlled Substance Database  ____________________________________________   FINAL CLINICAL IMPRESSION(S) / ED DIAGNOSES  Final diagnoses:  COVID-19  Dizziness  Neck pain      NEW MEDICATIONS STARTED DURING THIS VISIT:  New Prescriptions   No medications on file     Note:  This document was prepared using Dragon voice recognition software and may include unintentional dictation errors.    Versie Starks, PA-C 08/10/20 1604    Caryn Section Linden Dolin, PA-C 08/10/20 1610    Duffy Bruce, MD 08/11/20 [phone]

## 2020-08-10 NOTE — ED Triage Notes (Signed)
Pt states she tested positive for covid wed at [facility] and then started having R sided neck pain yesterday- pt states that she is still having the covid symptoms but they are getting better

## 2020-08-10 NOTE — H&P (Signed)
Chief Complaint: Patient presented on account of new onset right-sided numbness  HPI:  Kim Gomez is an 65 y.o. female medical history significant for hypertension, hyperlipidemia, anxiety disorder recent diagnosis of COVID-19 infection(08/06/2020 at an outside facility).  She remains asymptomatic for upper respiratory symptoms except for generalized malaise.  She has been doing fairly well up until this morning, was preparing breakfast, felt sudden onset of numbness involving right face right arm and right lower extremity.  No associated motor deficits.  No associated dysarthria or difficulty with swallowing.  Due to persistent symptoms, patient decided to come to the emergency room after several hours.  At time of presentation, patient was beyond window for tPA.  Patient denied any prior history of CVA.  She is on OCPs for treatment of perimenopausal symptoms.  She also denied any prior history of arrhythmias.  She admits noncompliance with aspirin and blood pressure medication.  Past Medical History:  Diagnosis Date   Anxiety    Arthritis    legs, hands   Cellulitis of right knee    started on antibiotics 06/21/15.     Depression    Family history of adverse reaction to anesthesia    sister and mom -PONV   Hypercholesteremia    Hypertension     Past Surgical History:  Procedure Laterality Date   ABDOMINAL HYSTERECTOMY     COLONOSCOPY WITH PROPOFOL N/A 06/27/2015   Procedure: COLONOSCOPY WITH PROPOFOL;  Surgeon: Lucilla Lame, MD;  Location: [facility];  Service: Endoscopy;  Laterality: N/A;   POLYPECTOMY  06/27/2015   Procedure: POLYPECTOMY;  Surgeon: Lucilla Lame, MD;  Location: [facility];  Service: Endoscopy;;   TOTAL HIP ARTHROPLASTY Right 09/10/2019   Procedure: TOTAL HIP ARTHROPLASTY ANTERIOR APPROACH;  Surgeon: Lovell Sheehan, MD;  Location: [facility];  Service: Orthopedics;  Laterality: Right;    No family history on file. Social History:  reports that she  has never smoked. She has never used smokeless tobacco. She reports that she does not drink alcohol and does not use drugs.  Allergies: No Known Allergies  (Not in a hospital admission)   Results for orders placed or performed during the hospital encounter of 08/10/20 (from the past 48 hour(s))  Comprehensive metabolic panel     Status: Abnormal   Collection Time: 08/10/20  1:46 PM  Result Value Ref Range   Sodium 137 135 - 145 mmol/L   Potassium 3.2 (L) 3.5 - 5.1 mmol/L   Chloride 103 98 - 111 mmol/L   CO2 27 22 - 32 mmol/L   Glucose, Bld 96 70 - 99 mg/dL    Comment: Glucose reference range applies only to samples taken after fasting for at least 8 hours.   BUN 13 8 - 23 mg/dL   Creatinine, Ser 0.84 0.44 - 1.00 mg/dL   Calcium 9.2 8.9 - 10.3 mg/dL   Total Protein 7.5 6.5 - 8.1 g/dL   Albumin 3.9 3.5 - 5.0 g/dL   AST 19 15 - 41 U/L   ALT 18 0 - 44 U/L   Alkaline Phosphatase 56 38 - 126 U/L   Total Bilirubin 0.5 0.3 - 1.2 mg/dL   GFR, Estimated >60 >60 mL/min    Comment: (NOTE) Calculated using the CKD-EPI Creatinine Equation (2021)    Anion gap 7 5 - 15    Comment: Performed at [facility], [address], [place], [place] [address]  Troponin I (High Sensitivity)     Status: None  Collection Time: 08/10/20  1:46 PM  Result Value Ref Range   Troponin I (High Sensitivity) 3 <18 ng/L    Comment: (NOTE) Elevated high sensitivity troponin I (hsTnI) values and significant  changes across serial measurements may suggest ACS but many other  chronic and acute conditions are known to elevate hsTnI results.  Refer to the "Links" section for chest pain algorithms and additional  guidance. Performed at [facility], [address] [place], [place] [address]   CBC with Differential     Status: None   Collection Time: 08/10/20  1:46 PM  Result Value Ref Range   WBC 7.2 4.0 - 10.5 K/uL   RBC 4.28 3.87 - 5.11 MIL/uL   Hemoglobin 13.2 12.0 - 15.0 g/dL   HCT  38.9 36.0 - 46.0 %   MCV 90.9 80.0 - 100.0 fL   MCH 30.8 26.0 - 34.0 pg   MCHC 33.9 30.0 - 36.0 g/dL   RDW 13.4 11.5 - 15.5 %   Platelets 199 150 - 400 K/uL   nRBC 0.0 0.0 - 0.2 %   Neutrophils Relative % 54 %   Neutro Abs 3.9 1.7 - 7.7 K/uL   Lymphocytes Relative 32 %   Lymphs Abs 2.3 0.7 - 4.0 K/uL   Monocytes Relative 9 %   Monocytes Absolute 0.6 0.1 - 1.0 K/uL   Eosinophils Relative 3 %   Eosinophils Absolute 0.2 0.0 - 0.5 K/uL   Basophils Relative 1 %   Basophils Absolute 0.1 0.0 - 0.1 K/uL   Immature Granulocytes 1 %   Abs Immature Granulocytes 0.04 0.00 - 0.07 K/uL    Comment: Performed at [facility], [address] [place], [place] [address]  Urinalysis, Complete w Microscopic Urine, Clean Catch     Status: Abnormal   Collection Time: 08/10/20  2:40 PM  Result Value Ref Range   Color, Urine YELLOW (A) YELLOW   APPearance HAZY (A) CLEAR   Specific Gravity, Urine 1.010 1.005 - 1.030   pH 8.0 5.0 - 8.0   Glucose, UA NEGATIVE NEGATIVE mg/dL   Hgb urine dipstick NEGATIVE NEGATIVE   Bilirubin Urine NEGATIVE NEGATIVE   Ketones, ur NEGATIVE NEGATIVE mg/dL   Protein, ur NEGATIVE NEGATIVE mg/dL   Nitrite NEGATIVE NEGATIVE   Leukocytes,Ua NEGATIVE NEGATIVE   RBC / HPF 6-10 0 - 5 RBC/hpf   WBC, UA 0-5 0 - 5 WBC/hpf   Bacteria, UA FEW (A) NONE SEEN   Squamous Epithelial / LPF 0-5 0 - 5   Mucus PRESENT     Comment: Performed at [facility], [place], [name] [address]  Troponin I (High Sensitivity)     Status: None   Collection Time: 08/10/20  3:15 PM  Result Value Ref Range   Troponin I (High Sensitivity) 5 <18 ng/L    Comment: (NOTE) Elevated high sensitivity troponin I (hsTnI) values and significant  changes across serial measurements may suggest ACS but many other  chronic and acute conditions are known to elevate hsTnI results.  Refer to the "Links" section for chest pain algorithms and additional  guidance. Performed at [facility], [address] [place], [place] [address]   Protime-INR     Status: None   Collection Time: 08/10/20  5:58 PM  Result Value Ref Range   Prothrombin Time 13.4 11.4 - 15.2 seconds   INR 1.0 0.8 - 1.2    Comment: (NOTE) INR goal varies based on device and disease  states. Performed at [facility], [address] [place],  [address]    MR ANGIO HEAD WO CONTRAST  Result Date: 08/10/2020 CLINICAL DATA:  Dizziness, persistent/recurrent, cardiac or vascular cause suspected. Neck pain and headaches. Recently diagnosed with COVID-19. EXAM: MRI HEAD WITHOUT CONTRAST MRA HEAD WITHOUT CONTRAST TECHNIQUE: Multiplanar, multi-echo pulse sequences of the brain and surrounding structures were acquired without intravenous contrast. Angiographic images of the Circle of Willis were acquired using MRA technique without intravenous contrast. COMPARISON:  No pertinent prior exam. FINDINGS: MRI HEAD FINDINGS Brain: There is a 1 cm acute infarct in the lateral aspect of the left thalamus/posterior limb of internal capsule. No intracranial hemorrhage, midline shift, or extra-axial fluid collection is identified. Patchy T2 hyperintensities in the deep cerebral white matter and and deep gray nuclei bilaterally are nonspecific but compatible with moderately age advanced chronic small vessel ischemic disease. The ventricles and sulci are normal. There is asymmetric enlargement of the pituitary gland on the right with a height of 7 mm and convex margin without regional mass effect. Vascular: Major intracranial vascular flow voids are preserved. Skull and upper cervical spine: Unremarkable bone marrow signal. Sinuses/Orbits: Unremarkable orbits. Minimal mucosal thickening in the paranasal sinuses. Clear mastoid air cells. Other: None. MRA HEAD FINDINGS Anterior circulation: The internal carotid arteries are widely patent from skull base to carotid termini. ACAs and MCAs are patent with mild branch  vessel irregularity but no evidence of a proximal branch occlusion or flow limiting proximal stenosis. No aneurysm is identified. Posterior circulation: The included portions of the distal vertebral arteries are widely patent to the basilar with the left being mildly dominant. The basilar artery is widely patent. Posterior communicating arteries are diminutive or absent. The PCAs are patent with mild multifocal irregular narrowing bilaterally but no high-grade proximal stenosis. No aneurysm is identified. Anatomic variants: None. IMPRESSION: 1. Acute left thalamic/internal capsule infarct. 2. Moderate chronic small vessel ischemic disease. 3. No major intracranial arterial occlusion or flow limiting proximal stenosis. 4. Mild asymmetric enlargement of the pituitary gland on the right, indeterminate although an underlying adenoma is possible. No regional mass effect. No further imaging evaluation or follow-up is necessary. Consider endocrine function tests and correlate for history of pituitary hypersecretion. This follows ACR consensus guidelines: Management of Incidental Pituitary Findings on CT, MRI and F18-FDG PET: A White Paper of the ACR Incidental Findings Committee. J Am Coll Radiol 2018; [id number]. Electronically Signed   By: Logan Bores M.D.   On: 08/10/2020 16:55   MR BRAIN WO CONTRAST  Result Date: 08/10/2020 CLINICAL DATA:  Dizziness, persistent/recurrent, cardiac or vascular cause suspected. Neck pain and headaches. Recently diagnosed with COVID-19. EXAM: MRI HEAD WITHOUT CONTRAST MRA HEAD WITHOUT CONTRAST TECHNIQUE: Multiplanar, multi-echo pulse sequences of the brain and surrounding structures were acquired without intravenous contrast. Angiographic images of the Circle of Willis were acquired using MRA technique without intravenous contrast. COMPARISON:  No pertinent prior exam. FINDINGS: MRI HEAD FINDINGS Brain: There is a 1 cm acute infarct in the lateral aspect of the left thalamus/posterior  limb of internal capsule. No intracranial hemorrhage, midline shift, or extra-axial fluid collection is identified. Patchy T2 hyperintensities in the deep cerebral white matter and and deep gray nuclei bilaterally are nonspecific but compatible with moderately age advanced chronic small vessel ischemic disease. The ventricles and sulci are normal. There is asymmetric enlargement of the pituitary gland on the right with a height of 7 mm and convex margin without regional mass effect. Vascular: Major  intracranial vascular flow voids are preserved. Skull and upper cervical spine: Unremarkable bone marrow signal. Sinuses/Orbits: Unremarkable orbits. Minimal mucosal thickening in the paranasal sinuses. Clear mastoid air cells. Other: None. MRA HEAD FINDINGS Anterior circulation: The internal carotid arteries are widely patent from skull base to carotid termini. ACAs and MCAs are patent with mild branch vessel irregularity but no evidence of a proximal branch occlusion or flow limiting proximal stenosis. No aneurysm is identified. Posterior circulation: The included portions of the distal vertebral arteries are widely patent to the basilar with the left being mildly dominant. The basilar artery is widely patent. Posterior communicating arteries are diminutive or absent. The PCAs are patent with mild multifocal irregular narrowing bilaterally but no high-grade proximal stenosis. No aneurysm is identified. Anatomic variants: None. IMPRESSION: 1. Acute left thalamic/internal capsule infarct. 2. Moderate chronic small vessel ischemic disease. 3. No major intracranial arterial occlusion or flow limiting proximal stenosis. 4. Mild asymmetric enlargement of the pituitary gland on the right, indeterminate although an underlying adenoma is possible. No regional mass effect. No further imaging evaluation or follow-up is necessary. Consider endocrine function tests and correlate for history of pituitary hypersecretion. This follows  ACR consensus guidelines: Management of Incidental Pituitary Findings on CT, MRI and F18-FDG PET: A White Paper of the ACR Incidental Findings Committee. J Am Coll Radiol 2018; [id number]. Electronically Signed   By: Logan Bores M.D.   On: 08/10/2020 16:55   DG Chest Portable 1 View  Result Date: 08/10/2020 CLINICAL DATA:  covid + weakness EXAM: PORTABLE CHEST 1 VIEW COMPARISON:  None. FINDINGS: The cardiomediastinal silhouette is the upper limits of normal in contour. No pleural effusion. No pneumothorax. No acute pleuroparenchymal abnormality. Visualized abdomen is unremarkable. Multilevel degenerative changes of the thoracic spine. IMPRESSION: No acute cardiopulmonary abnormality. Electronically Signed   By: Valentino Saxon MD   On: 08/10/2020 13:38    Review of Systems  Constitutional: Negative.   HENT: Negative.    Eyes: Negative.   Cardiovascular: Negative.   Gastrointestinal: Negative.   Endocrine: Negative.   Musculoskeletal: Negative.   Skin: Negative.   Allergic/Immunologic: Negative.   Neurological:  Positive for dizziness, numbness and headaches. Negative for syncope, speech difficulty and weakness.  Psychiatric/Behavioral: Negative.     Blood pressure (!) 196/97, pulse 75, temperature 98 F (36.7 C), temperature source Oral, resp. rate 17, height '5\' 4"'$  (1.626 m), weight 79.4 kg, SpO2 97 %. Physical Exam Constitutional:      Appearance: Normal appearance. She is normal weight.  HENT:     Head: Normocephalic.     Nose: Nose normal.  Cardiovascular:     Rate and Rhythm: Normal rate and regular rhythm.  Pulmonary:     Effort: Pulmonary effort is normal.     Breath sounds: Normal breath sounds.  Chest:  Breasts:    Right: Normal.     Left: Normal.  Abdominal:     General: Bowel sounds are normal.     Palpations: Abdomen is soft.  Musculoskeletal:        General: Normal range of motion.  Skin:    General: Skin is warm.  Neurological:     General: No focal  deficit present.     Mental Status: She is alert and oriented to person, place, and time.     Sensory: Sensory deficit present.     Motor: Motor function is intact.     Coordination: Coordination is intact.     Comments: Right upper extremity numbness, improved.  Still has facial numbness on right  Psychiatric:        Mood and Affect: Mood normal.     Assessment/Plan 65 year old African-American female with risk factors of hypertension, dyslipidemia who presents with elevated blood pressure and new onset right-sided numbness.  MRI /MRA done in the ED revealed an acute left thalamic infarct with moderate small vessel disease.  Acute Left thalamic ischemic stroke: Patient has evidence of right-sided numbness without any obvious weakness.  NIH score was 0.  Patient previously on aspirin albeit not compliant.  Patient was restarted on aspirin.  Plavix was added for dual antiplatelet therapy.  Dose of statins was increased to maximum dose.  We will allow permissive hypertension for now.  Echocardiogram and carotid duplex pending. Telemetry in place. Neurology has also been consulted to evaluate and advise further management and follow-up.  Hypertension: Blood pressure is accelerated.  We will optimize blood pressure with hydralazine as needed for SBP greater than 124mHg.  Recent diagnosis of COVID-19 positive: Patient is however asymptomatic for any URI symptoms.  No shortness of breath.  She is up-to-date with her COVID vaccination and has been boosted.  We will continue to monitor for now.  No indication for dexamethasone or antiviral treatment at this time.  Anxiety disorder: Patient is on Celexa for symptoms.  Continue same.  Hypokalemia: Potassium placement therapy will be instituted.  Follow-up magnesium levels in a.m.     PArtist Beach MD 08/10/2020, 7:33 PM

## 2020-08-10 NOTE — ED Notes (Signed)
Pt reports last known well of 0930 am today. She reports right side facial, arm, leg numbness to attending PA.

## 2020-08-11 ENCOUNTER — Inpatient Hospital Stay (HOSPITAL_COMMUNITY)
Admit: 2020-08-11 | Discharge: 2020-08-11 | Disposition: A | Discharge status: Home / Self Care | Payer: No Typology Code available for payment source | Attending: Internal Medicine | Admitting: Internal Medicine

## 2020-08-11 ENCOUNTER — Encounter: Payer: Self-pay | Admitting: Internal Medicine

## 2020-08-11 DIAGNOSIS — I639 Cerebral infarction, unspecified: Secondary | ICD-10-CM | POA: Diagnosis not present

## 2020-08-11 DIAGNOSIS — I6389 Other cerebral infarction: Secondary | ICD-10-CM | POA: Diagnosis not present

## 2020-08-11 LAB — LIPID PANEL
Cholesterol: 220 mg/dL — ABNORMAL HIGH (ref 0–200)
HDL: 42 mg/dL (ref >40)
LDL Cholesterol: 143 mg/dL — ABNORMAL HIGH (ref 0–99)
Total CHOL/HDL Ratio: 5.2 RATIO
Triglycerides: 176 mg/dL — ABNORMAL HIGH (ref <150)
VLDL: 35 mg/dL (ref 0–40)

## 2020-08-11 LAB — ECHOCARDIOGRAM COMPLETE
AR max vel: 1.93 cm2
AV Area VTI: 2.42 cm2
AV Area mean vel: 1.96 cm2
AV Mean grad: 3 mmHg
AV Peak grad: 5.4 mmHg
Ao pk vel: 1.16 m/s
Area-P 1/2: 4.6 cm2
Height: 64 in
S' Lateral: 2.07 cm
Weight: 2800 oz

## 2020-08-11 LAB — BASIC METABOLIC PANEL
Anion gap: 9 (ref 5–15)
BUN: 12 mg/dL (ref 8–23)
CO2: 24 mmol/L (ref 22–32)
Calcium: 8.8 mg/dL — ABNORMAL LOW (ref 8.9–10.3)
Chloride: 104 mmol/L (ref 98–111)
Creatinine, Ser: 0.78 mg/dL (ref 0.44–1.00)
GFR, Estimated: >60 mL/min (ref >60)
Glucose, Bld: 81 mg/dL (ref 70–99)
Potassium: 3.3 mmol/L — ABNORMAL LOW (ref 3.5–5.1)
Sodium: 137 mmol/L (ref 135–145)

## 2020-08-11 LAB — HEMOGLOBIN A1C
Hgb A1c MFr Bld: 5.7 % — ABNORMAL HIGH (ref 4.8–5.6)
Mean Plasma Glucose: 117 mg/dL

## 2020-08-11 LAB — HIV ANTIBODY (ROUTINE TESTING W REFLEX): HIV Screen 4th Generation wRfx: NONREACTIVE

## 2020-08-11 MED ORDER — ACETAMINOPHEN 325 MG PO TABS
650.0000 mg | ORAL_TABLET | ORAL | Status: DC | PRN
  Administered 2020-08-12: 650 mg via ORAL
  Filled 2020-08-11: qty 2

## 2020-08-11 MED ORDER — ONDANSETRON HCL 4 MG PO TABS: 4.0000 mg | ORAL_TABLET | Freq: Four times a day (QID) | ORAL | Status: DC | PRN

## 2020-08-11 MED ORDER — ESCITALOPRAM OXALATE 20 MG PO TABS
20.0000 mg | ORAL_TABLET | Freq: Every day | ORAL | Status: DC
  Administered 2020-08-11 – 2020-08-12 (×2): 20 mg via ORAL
  Filled 2020-08-11: qty 1
  Filled 2020-08-11: qty 2

## 2020-08-11 MED ORDER — STROKE: EARLY STAGES OF RECOVERY BOOK: Freq: Once | Status: DC

## 2020-08-11 MED ORDER — SENNOSIDES-DOCUSATE SODIUM 8.6-50 MG PO TABS: 1.0000 | ORAL_TABLET | Freq: Every evening | ORAL | Status: DC | PRN

## 2020-08-11 MED ORDER — ACETAMINOPHEN 650 MG RE SUPP: 650.0000 mg | RECTAL | Status: DC | PRN

## 2020-08-11 MED ORDER — ASPIRIN 81 MG PO CHEW
81.0000 mg | CHEWABLE_TABLET | Freq: Two times a day (BID) | ORAL | Status: DC
  Administered 2020-08-11 – 2020-08-12 (×3): 81 mg via ORAL
  Filled 2020-08-11 (×3): qty 1

## 2020-08-11 MED ORDER — METHOCARBAMOL 500 MG PO TABS
500.0000 mg | ORAL_TABLET | Freq: Four times a day (QID) | ORAL | Status: DC | PRN
  Filled 2020-08-11: qty 1

## 2020-08-11 MED ORDER — ENOXAPARIN SODIUM 40 MG/0.4ML IJ SOSY
40.0000 mg | PREFILLED_SYRINGE | INTRAMUSCULAR | Status: DC
  Administered 2020-08-11 – 2020-08-12 (×2): 40 mg via SUBCUTANEOUS
  Filled 2020-08-11 (×2): qty 0.4

## 2020-08-11 MED ORDER — POTASSIUM CHLORIDE CRYS ER 20 MEQ PO TBCR
40.0000 meq | EXTENDED_RELEASE_TABLET | Freq: Once | ORAL | Status: AC
  Administered 2020-08-11: 40 meq via ORAL
  Filled 2020-08-11: qty 2

## 2020-08-11 MED ORDER — PRAVASTATIN SODIUM 20 MG PO TABS
80.0000 mg | ORAL_TABLET | Freq: Every day | ORAL | Status: DC
  Administered 2020-08-11: 18:00:00 80 mg via ORAL
  Filled 2020-08-11: qty 2

## 2020-08-11 MED ORDER — DOCUSATE SODIUM 100 MG PO CAPS
100.0000 mg | ORAL_CAPSULE | Freq: Two times a day (BID) | ORAL | Status: DC
  Administered 2020-08-11 – 2020-08-12 (×3): 100 mg via ORAL
  Filled 2020-08-11 (×3): qty 1

## 2020-08-11 MED ORDER — ACETAMINOPHEN 160 MG/5ML PO SOLN
650.0000 mg | ORAL | Status: DC | PRN
  Filled 2020-08-11: qty 20.3

## 2020-08-11 MED ORDER — ATORVASTATIN CALCIUM 20 MG PO TABS: 80.0000 mg | ORAL_TABLET | Freq: Every day | ORAL | Status: DC

## 2020-08-11 MED ORDER — TRAZODONE HCL 50 MG PO TABS
50.0000 mg | ORAL_TABLET | Freq: Every day | ORAL | Status: DC
  Administered 2020-08-11: 23:00:00 50 mg via ORAL
  Filled 2020-08-11: qty 1

## 2020-08-11 MED ORDER — HYDROCODONE-ACETAMINOPHEN 7.5-325 MG PO TABS: 1.0000 | ORAL_TABLET | ORAL | Status: DC | PRN

## 2020-08-11 NOTE — Consult Note (Signed)
Neurology Consultation Reason for Consult: Left thalamic stroke Requesting Physician: Terrilee Croak  CC: Right sided numbness and paraesthesias   History is obtained from: Patient and chart review  HPI: Kim Gomez is a 65 y.o. female with a PMHx significant for HTN, HLD, anxiety/depression, obesity (BMI 30.04)  She reports she was in her usual state of health on 7/24 but then on 7/25 developed acute right face/arm/leg numbness which has been gradually improving during her stay in the hospital. She additionally had a daily headache after COVID19 infection but this has been improving in frequency (no longer daily) and is not associated with nausea, vomiting, not positional, and without any vision changes. No current respiratory symptoms. She does snore at night but has not had a sleep study.   While initial H&P reports non-adherence with aspirin and BP meds, to me patient reports full adherence. She also notes she is looking for a new PCP  LKW: unclear, 8:30 AM 7/25 symptom onset tPA given?: No, out of the window Premorbid modified rankin scale:      0 - No symptoms.  ROS: All other review of systems was negative except as noted in the HPI.   Past Medical History:  Diagnosis Date   Anxiety    Arthritis    legs, hands   Cellulitis of right knee    started on antibiotics 06/21/15.     Depression    Family history of adverse reaction to anesthesia    sister and mom -PONV   Hypercholesteremia    Hypertension    Past Surgical History:  Procedure Laterality Date   ABDOMINAL HYSTERECTOMY     COLONOSCOPY WITH PROPOFOL N/A 06/27/2015   Procedure: COLONOSCOPY WITH PROPOFOL;  Surgeon: Lucilla Lame, MD;  Location: [place];  Service: Endoscopy;  Laterality: N/A;   POLYPECTOMY  06/27/2015   Procedure: POLYPECTOMY;  Surgeon: Lucilla Lame, MD;  Location: [place];  Service: Endoscopy;;   TOTAL HIP ARTHROPLASTY Right 09/10/2019   Procedure: TOTAL HIP ARTHROPLASTY ANTERIOR  APPROACH;  Surgeon: Lovell Sheehan, MD;  Location: [facility];  Service: Orthopedics;  Laterality: Right;   Current Outpatient Medications  Medication Instructions   amLODipine (NORVASC) 2.5 mg, Daily   aspirin 81 mg, Oral, 2 times daily   Calcium Carb-Cholecalciferol (CALCIUM 500 +D PO) 1 tablet, Oral, Daily   escitalopram (LEXAPRO) 20 mg, Oral, Daily   estradiol (ESTRACE) 1 mg, Oral, Daily   lovastatin (MEVACOR) 20 mg, Oral, Daily   medroxyPROGESTERone (PROVERA) 5 mg, Oral, Daily at bedtime   traZODone (DESYREL) 50 mg, Daily at bedtime      Current Facility-Administered Medications:     stroke: mapping our early stages of recovery book, , Does not apply, Once, Acheampong, Warnell Bureau, MD   acetaminophen (TYLENOL) tablet 650 mg, 650 mg, Oral, Q4H PRN **OR** acetaminophen (TYLENOL) 160 MG/5ML solution 650 mg, 650 mg, Per Tube, Q4H PRN **OR** acetaminophen (TYLENOL) suppository 650 mg, 650 mg, Rectal, Q4H PRN, Acheampong, Warnell Bureau, MD   aspirin chewable tablet 81 mg, 81 mg, Oral, BID, Acheampong, Warnell Bureau, MD, 81 mg at 08/11/20 0913   clopidogrel (PLAVIX) tablet 75 mg, 75 mg, Oral, Daily, Acheampong, Warnell Bureau, MD, 75 mg at 08/11/20 0914   docusate sodium (COLACE) capsule 100 mg, 100 mg, Oral, BID, Acheampong, Warnell Bureau, MD, 100 mg at 08/11/20 0914   enoxaparin (LOVENOX) injection 40 mg, 40 mg, Subcutaneous, Q24H, Acheampong, Warnell Bureau, MD, 40 mg at 08/11/20 0818   escitalopram (LEXAPRO) tablet 20  mg, 20 mg, Oral, Daily, Acheampong, Warnell Bureau, MD, 20 mg at 08/11/20 [id number]   hydrALAZINE (APRESOLINE) injection 5 mg, 5 mg, Intravenous, Q6H PRN, Acheampong, Warnell Bureau, MD   HYDROcodone-acetaminophen (NORCO) 7.5-325 MG per tablet 1-2 tablet, 1-2 tablet, Oral, Q4H PRN, Acheampong, Warnell Bureau, MD   methocarbamol (ROBAXIN) tablet 500 mg, 500 mg, Oral, Q6H PRN, Acheampong, Warnell Bureau, MD   ondansetron (ZOFRAN) tablet 4 mg, 4 mg, Oral, Q6H PRN, Acheampong, Warnell Bureau, MD   pravastatin (PRAVACHOL) tablet 80 mg, 80 mg, Oral,  q1800, Acheampong, Warnell Bureau, MD, 80 mg at 08/11/20 1730   senna-docusate (Senokot-S) tablet 1 tablet, 1 tablet, Oral, QHS PRN, Acheampong, Warnell Bureau, MD   traZODone (DESYREL) tablet 50 mg, 50 mg, Oral, QHS, Acheampong, Warnell Bureau, MD  No known strokes in her family history at this time Mother and sister with post-op N/V   Social History:  reports that she has never smoked. She has never used smokeless tobacco. She reports that she does not drink alcohol and does not use drugs.  Exam: Current vital signs: BP (!) 155/79 (BP Location: Right Arm)   Pulse 76   Temp 98 F (36.7 C) (Oral)   Resp 20   Ht '5\' 4"'$  (1.626 m)   Wt 79.4 kg   SpO2 100%   BMI 30.04 kg/m  Vital signs in last 24 hours: Pulse Rate:  [71-82] 76 (07/25 1637) Resp:  [16-25] 20 (07/25 1637) BP: (155-196)/(79-98) 155/79 (07/25 1637) SpO2:  [94 %-100 %] 100 % (07/25 1637)   Physical Exam  Constitutional: Appears well-developed and well-nourished.  Psych: Affect appropriate to situation, calm and cooperative Eyes: No scleral injection HENT: No oropharyngeal obstruction.  MSK: no joint deformities.  Cardiovascular: Normal rate and regular rhythm.  Respiratory: Effort normal, non-labored breathing GI: Soft.  No distension. There is no tenderness.  Skin: Warm dry and intact visible skin  Neuro: Mental Status: Patient is awake, alert, oriented to person, place, month, year, and situation. Patient is able to give a clear and coherent history. No signs of aphasia or neglect Cranial Nerves: II: Visual Fields are full. Pupils are equal, round, and reactive to light.   III,IV, VI: EOMI without ptosis or diploplia.  V: Facial sensation is reduced to light touch in the V1, V2, V3 distributions on the right VII: Facial movement is symmetric.  VIII: hearing is intact to voice X: Uvula elevates symmetrically within limits of visualization XI: Shoulder shrug is symmetric. XII: tongue is midline without atrophy or  fasciculations.  Motor: Tone is normal. Bulk is normal. 5/5 strength was present in all four extremities.  Sensory: Sensation is symmetric to light touch and temperature in the arms and legs. Deep Tendon Reflexes: 2+ and symmetric in the biceps and patellae.  Plantars: Toes are downgoing bilaterally.  Cerebellar: FNF and HKS are intact bilaterally  NIHSS total 1 Score breakdown: Sensory loss in the right face    I have reviewed labs in epic and the results pertinent to this consultation are:   Basic Metabolic Panel: Recent Labs  Lab 08/10/20 1346 08/11/20 0636  NA 137 137  K 3.2* 3.3*  CL 103 104  CO2 27 24  GLUCOSE 96 81  BUN 13 12  CREATININE 0.84 0.78  CALCIUM 9.2 8.8*    CBC: Recent Labs  Lab 08/10/20 1346  WBC 7.2  NEUTROABS 3.9  HGB 13.2  HCT 38.9  MCV 90.9  PLT 199    Coagulation Studies: Recent Labs  08/10/20 1758  LABPROT 13.4  INR 1.0     Lab Results  Component Value Date   CHOL 220 (H) 08/11/2020   HDL 42 08/11/2020   LDLCALC 143 (H) 08/11/2020   TRIG 176 (H) 08/11/2020   CHOLHDL 5.2 08/11/2020   Lab Results  Component Value Date   HGBA1C 5.7 (H) 08/11/2020    ECHO:   1. Left ventricular ejection fraction, by estimation, is 65 to 70%. The  left ventricle has normal function. The left ventricle has no regional  wall motion abnormalities. There is mild left ventricular hypertrophy.  Left ventricular diastolic parameters  are consistent with Grade I diastolic dysfunction (impaired relaxation).   2. Right ventricular systolic function is normal. The right ventricular  size is normal. Tricuspid regurgitation signal is inadequate for assessing  PA pressure.   3. The mitral valve is normal in structure. Trivial mitral valve  regurgitation. No evidence of mitral stenosis.   4. The aortic valve is tricuspid. Aortic valve regurgitation is not  visualized. No aortic stenosis is present.   5. The inferior vena cava is normal in size  with greater than 50%  respiratory variability, suggesting right atrial pressure of 3 mmHg.   I have reviewed the images obtained: MRI brain with small left thalamic stroke   Impression: Small vessel lacunar stroke in the setting of her known HTN, HLD, obesity.   Recommendations: - Hemoglobin A1c 5.7%, meeting goal < 7% - Pravastatin is moderate intensity only, switching to atorva 80 mg for goal LDL < 70 (currently 143)  - Agree with discontinuing estradiol  - continue DAPT for a 21 day course, then ASA 81 mg monotherapy - As it has been > 24 hours since symptom onset, please gradually begin to normalize BP, long term BP goal is normotension - Risk factor modification (diet, exercise, medication adherence) discussed with patient - Outpatient sleep study given Mallampati score and snoring; untreated OSA is a significant stroke risk factor - Stable for discharge from neurological perspective - Patient prefers outpatient follow-up with [facility] neurology, instructed to call for appointment [phone] - Appreciate primary team assistance with PCP referral   Lesleigh Noe MD-PhD [facility] [phone] [facility] coverage for [facility] is from 8 AM to 4 AM in-house and 4 PM to 8 PM by telephone/video. 8 PM to 8 AM emergent questions or overnight urgent questions should be addressed to Teleneurology On-call or [facility] neurohospitalist; contact information can be found on AMION

## 2020-08-11 NOTE — Progress Notes (Signed)
PROGRESS NOTE  Kim Gomez  DOB: 1955-07-03  PCP: Elgie Collard, MD [id number]  DOA: 08/10/2020  LOS: 1 day  Hospital Day: 2   Chief Complaint  Patient presents with   Neck Pain   Brief narrative: Kim Gomez is a 65 y.o. female with PMH significant for HTN, HLD, anxiety disorder, recently hospitalized for COVID-19 infection (08/06/2020) at an outside facility.  Patient was brought to the ED on 7/24 after she had a sudden onset of numbness involving right side of her body without any motor deficits, dysarthria, dysphagia.  Symptoms persisted for several hours and hence patient came to the ED beyond tPA window.  Admits to noncompliance with aspirin and blood pressure medicines  In the ED, patient was afebrile, blood pressure elevated to 181/95. Labs with potassium low at 3.3. COVID PCR positive. MRI brain showed an acute left thalamic/internal capsule infarct with moderate chronic small vessel ischemic disease.  MRA head and neck did not show any major intracranial large vessel occlusion.  Subjective: Patient was seen and examined this morning.  Pleasant middle-aged African-American female.  Lying in bed.  Not in distress.  Numbness seems to resolving.  Assessment/Plan: Acute Left thalamic ischemic stroke -Presented with several hours of numbness on the right side of the body without motor deficits or other focal neurological deficits -MRI brain with acute left thalamic/internal capsule infarct with moderate small vessel ischemic disease -Admitted for stroke work-up.  -Neurology consulted.  -Lipid panel with HDL 42, LDL elevated to 143 -Pending A1c -Pending echocardiogram -Carotid duplex with no significant stenosis on either ICA. -Prior to admission, patient was supposed to be on aspirin but reportedly noncompliant. -Currently she is on dual antiplatelet therapy with aspirin 80 mg daily, Plavix 75 mg daily and pravastatin. -Pending PT/ST/OT  eval.  Accelerated hypertension -Blood pressure elevated to 170s and 180s. -Reportedly noncompliant to blood pressure medicines. -Allow permissive hypertension for 48 to 70 hours in the face of acute stroke. -Home meds include amlodipine 2.5 mg daily -Resume when appropriate.   Recent diagnosis of COVID-19 positive -Currently asymptomatic.  She is up-to-date with her COVID vaccination and has been boosted.  -No need of treatment for COVID at this time.   Anxiety disorder -Continue Celexa.  Hypokalemia -Potassium low at 3.3.  Replacement ordered. Recent Labs  Lab 08/10/20 1346 08/11/20 0636  K 3.2* 3.3*   Mobility: PT eval pending Code Status:   Code Status: Full Code  Nutritional status: Body mass index is 30.04 kg/m.     Diet:  Diet Order             Diet Heart Room service appropriate? Yes; Fluid consistency: Thin  Diet effective now                  DVT prophylaxis:  enoxaparin (LOVENOX) injection 40 mg Start: 08/11/20 0800 SCD's Start: 08/11/20 0529   Antimicrobials: None Fluid: None Consultants: Neurology Family Communication: None  Status is: Inpatient  Remains inpatient appropriate because: Ongoing stroke work-up  Dispo: The patient is from: Home              Anticipated d/c is to: Hopefully home in 1 to 2 days              Patient currently is not medically stable to d/c.   Difficult to place patient No     Infusions:    Scheduled Meds:   stroke: mapping our early stages of recovery book   Does not  apply Once   aspirin  81 mg Oral BID   clopidogrel  75 mg Oral Daily   docusate sodium  100 mg Oral BID   enoxaparin (LOVENOX) injection  40 mg Subcutaneous Q24H   escitalopram  20 mg Oral Daily   pravastatin  80 mg Oral q1800   traZODone  50 mg Oral QHS    Antimicrobials: Anti-infectives (From admission, onward)    None       PRN meds: acetaminophen **OR** acetaminophen (TYLENOL) oral liquid 160 mg/5 mL **OR** acetaminophen,  hydrALAZINE, HYDROcodone-acetaminophen, methocarbamol, ondansetron, senna-docusate   Objective: Vitals:   08/11/20 1015 08/11/20 1045  BP: (!) 179/97 (!) 160/80  Pulse: 82 81  Resp: (!) 25 (!) 23  Temp:    SpO2: 100% 100%   No intake or output data in the 24 hours ending 08/11/20 1554  Filed Weights   08/10/20 1200  Weight: 79.4 kg   Weight change:  Body mass index is 30.04 kg/m.   Physical Exam: General exam: Pleasant, middle-aged African-American female.  Not in distress Skin: No rashes, lesions or ulcers. HEENT: Atraumatic, normocephalic, no obvious bleeding Lungs: Clear to auscultation bilaterally CVS: Regular rate and rhythm, no murmur GI/Abd: Soft, nontender, nondistended, bowel sound present CNS: Alert, awake, oriented x3 Psychiatry: Mood appropriate Extremities: No pedal edema, no calf tenderness  Data Review: I have personally reviewed the laboratory data and studies available.  Recent Labs  Lab 08/10/20 1346  WBC 7.2  NEUTROABS 3.9  HGB 13.2  HCT 38.9  MCV 90.9  PLT 199   Recent Labs  Lab 08/10/20 1346 08/11/20 0636  NA 137 137  K 3.2* 3.3*  CL 103 104  CO2 27 24  GLUCOSE 96 81  BUN 13 12  CREATININE 0.84 0.78  CALCIUM 9.2 8.8*    F/u labs ordered Unresulted Labs (From admission, onward)     Start     Ordered   08/17/20 0500  Creatinine, serum  (enoxaparin (LOVENOX)    CrCl >/= 30 ml/min)  Weekly,   STAT     Comments: while on enoxaparin therapy    08/11/20 0528   08/12/20 0500  CBC with Differential/Platelet  Daily,   STAT      08/11/20 1554   08/12/20 [id number]  Basic metabolic panel  Daily,   STAT      08/11/20 1554   08/11/20 0529  Hemoglobin A1c  (Labs)  Tomorrow morning,   STAT        08/11/20 0528            Signed, Terrilee Croak, MD [facility] 08/11/2020

## 2020-08-11 NOTE — Evaluation (Signed)
Physical Therapy Evaluation Patient Details Name: Kim Gomez MRN: [id number] DOB: 06-02-1955 Today's Date: 08/11/2020   History of Present Illness  Patient is a 65 year old female with hypertension, dyslipidemia who presents with elevated blood pressure and new onset right-sided numbness.  MRI /MRA revealed an acute left thalamic infarct with moderate small vessel disease. Recent diagnosis of COVID, asymptomatic for any URI symptoms  Clinical Impression  Patient agreeable to PT. Patient is alert and oriented x 4, following all directions without difficulty. Patient reports she was independent with mobility prior to arrival and works.  Patient is demonstrating high level of functional mobility and reports numbness has resolved except for a small area on the right side of her face. Patient was able to get out of bed and ambulate around the room without physical assistance. Gait was steady but decreased in velocity compared to normal. Good standing balance demonstrated with functional standing activity, reaching outside base of support, with external trunk perturbations, and head turns and direction changes. Recommend discharge to home with outpatient PT for higher level balance/gait activity if symptoms persist. No DME recommended at this time.      Follow Up Recommendations Outpatient PT    Equipment Recommendations  None recommended by PT    Recommendations for Other Services       Precautions / Restrictions Precautions Precautions: Fall Restrictions Weight Bearing Restrictions: No      Mobility  Bed Mobility Overal bed mobility: Modified Independent             General bed mobility comments: head of bed elevated    Transfers Overall transfer level: Modified independent Equipment used: None                Ambulation/Gait Ambulation/Gait assistance: Supervision Gait Distance (Feet): 50 Feet Assistive device: None Gait Pattern/deviations: Decreased stride  length Gait velocity: decreased   General Gait Details: slow but steady cadence without assistive device. cues for safety and education provided for increasing activity slowly and walking to maintain current functional status. patient reports mild dizziness with upright activity with heart rate in the 80's and no significant change in blood prressure with activity  Stairs            Wheelchair Mobility    Modified Rankin (Stroke Patients Only)       Balance   Sitting-balance support: Feet supported Sitting balance-Leahy Scale: Normal     Standing balance support: During functional activity Standing balance-Leahy Scale: Good               High level balance activites: Turns;Head turns;Direction changes High Level Balance Comments: patient able to maintain standing balance with external trunk perturbations, reaching overhead and outside base of support, and during functional standing activity/ambulation             Pertinent Vitals/Pain Pain Assessment: No/denies pain    Home Living Family/patient expects to be discharged to:: Private residence Living Arrangements: Spouse/significant other Available Help at Discharge: Family Type of Home: House Home Access: Stairs to enter Entrance Stairs-Rails: None Technical brewer of Steps: 2 Home Layout: One level        Prior Function Level of Independence: Independent               Hand Dominance   Dominant Hand: Right    Extremity/Trunk Assessment   Upper Extremity Assessment Upper Extremity Assessment: Overall WFL for tasks assessed    Lower Extremity Assessment Lower Extremity Assessment: RLE deficits/detail;LLE deficits/detail RLE Deficits /  Details: dorsiflexion 5/5, knee extension 5/5 RLE Sensation: WNL RLE Coordination: WNL LLE Deficits / Details: dorsiflexion 5/5, knee extension 4+/5 LLE Sensation: WNL LLE Coordination: WNL       Communication   Communication: No difficulties   Cognition Arousal/Alertness: Awake/alert Behavior During Therapy: WFL for tasks assessed/performed Overall Cognitive Status: Within Functional Limits for tasks assessed                                 General Comments: patient is alert and oriented x 4, able to follow all commands without difficulty      General Comments      Exercises     Assessment/Plan    PT Assessment Patient needs continued PT services  PT Problem List Decreased activity tolerance;Decreased mobility;Decreased strength       PT Treatment Interventions Gait training;Stair training;Functional mobility training;Therapeutic activities;Therapeutic exercise;Neuromuscular re-education    PT Goals (Current goals can be found in the Care Plan section)  Acute Rehab PT Goals Patient Stated Goal: to go home PT Goal Formulation: With patient Time For Goal Achievement: 08/25/20 Potential to Achieve Goals: Good    Frequency Min 2X/week   Barriers to discharge        Co-evaluation               AM-PAC PT "6 Clicks" Mobility  Outcome Measure Help needed turning from your back to your side while in a flat bed without using bedrails?: None Help needed moving from lying on your back to sitting on the side of a flat bed without using bedrails?: None Help needed moving to and from a bed to a chair (including a wheelchair)?: None Help needed standing up from a chair using your arms (e.g., wheelchair or bedside chair)?: None Help needed to walk in hospital room?: A Little Help needed climbing 3-5 steps with a railing? : A Little 6 Click Score: 22    End of Session   Activity Tolerance: Patient tolerated treatment well Patient left: in bed;with nursing/sitter in room;with call bell/phone within reach Nurse Communication: Mobility status PT Visit Diagnosis: Other abnormalities of gait and mobility (R26.89)    Time: [id number] PT Time Calculation (min) (ACUTE ONLY): 28 min   Charges:   PT  Evaluation $PT Eval Moderate Complexity: 1 Mod PT Treatments $Therapeutic Activity: 8-22 mins        Minna Merritts, PT, MPT   Percell Locus 08/11/2020, 9:30 AM

## 2020-08-11 NOTE — Progress Notes (Signed)
*  PRELIMINARY RESULTS* Echocardiogram 2D Echocardiogram has been performed.  Sherrie Sport 08/11/2020, 10:28 AM

## 2020-08-11 NOTE — ED Notes (Signed)
Patient passed the swallowing screen prior to her getting her 04:15 potassium

## 2020-08-11 NOTE — ED Notes (Signed)
Informed RN bed assigned 

## 2020-08-11 NOTE — Evaluation (Addendum)
Occupational Therapy Evaluation Patient Details Name: Kim Gomez MRN: [id number] DOB: May 01, 1955 Today's Date: 08/11/2020    History of Present Illness Patient is a 65 year old female with hypertension, dyslipidemia who presents with elevated blood pressure and new onset right-sided numbness.  MRI /MRA revealed an acute left thalamic infarct with moderate small vessel disease. Recent diagnosis of COVID, asymptomatic for any URI symptoms   Clinical Impression   [name] is received in ED. She reports all symptoms of R sided numbness have resolved, other than small area of reduced sensation on R side of mouth. UE and LE strength, ROM, and sensation WFL and grossly symmetrical bilaterally. Pt able to perform bed mobility, transfers, w/in room ambulation, toileting, LB dressing, w/o assistance and w/ no instability. Therapist provided educ re: signs and sx of stroke, importance of seeking immediate medical care in presence of such sx. Pt verbalizes understanding and provides teach-back. Pt does have elevated BP but, other than this and numbness around mouth, pt reports she is back to her PLOF. Pt and therapist in agreement that pt is IND in fxl mobility and does not need any additional OT services at this time.     Follow Up Recommendations  No OT follow up    Equipment Recommendations  None recommended by OT    Recommendations for Other Services       Precautions / Restrictions Precautions Precaution Comments: airborne/contact precautions Restrictions Weight Bearing Restrictions: No      Mobility Bed Mobility Overal bed mobility: Modified Independent             General bed mobility comments: extra time required    Transfers Overall transfer level: Modified independent               General transfer comment: extra time required    Balance Overall balance assessment: Independent   Sitting balance-Leahy Scale: Normal     Standing balance support: During  functional activity                   High Level Balance Comments: maintains standing balance with fxl activity, reaching outside of BOS           ADL either performed or assessed with clinical judgement   ADL Overall ADL's : Independent                                             Vision Patient Visual Report: No change from baseline       Perception     Praxis      Pertinent Vitals/Pain Pain Assessment: No/denies pain     Hand Dominance Right   Extremity/Trunk Assessment Upper Extremity Assessment Upper Extremity Assessment: Overall WFL for tasks assessed   Lower Extremity Assessment Lower Extremity Assessment: Overall WFL for tasks assessed RLE Sensation: WNL RLE Coordination: WNL LLE Sensation: WNL LLE Coordination: WNL   Cervical / Trunk Assessment Cervical / Trunk Assessment: Normal   Communication Communication Communication: No difficulties   Cognition Arousal/Alertness: Awake/alert Behavior During Therapy: WFL for tasks assessed/performed Overall Cognitive Status: Within Functional Limits for tasks assessed                                 General Comments: Very pleasant, A&Ox4   General Comments  Pt reports continued slight  numbness on R side of face, around mouth.    Exercises Other Exercises Other Exercises: Educ re: stroke awareness, tx   Shoulder Instructions      Home Living Family/patient expects to be discharged to:: Private residence Living Arrangements: Spouse/significant other Available Help at Discharge: Family Type of Home: House Home Access: Stairs to enter Technical brewer of Steps: 2 Entrance Stairs-Rails: None Home Layout: One level     Bathroom Shower/Tub: Teacher, early years/pre: [place]: Bedside commode;Walker - 2 wheels;Cane - single point          Prior Functioning/Environment Level of Independence: Independent        Comments:  Pt reports no falls in past 6 months. Working full time, caring for her mother, active in church and community        OT Problem List: Decreased strength;Decreased activity tolerance;Impaired sensation      OT Treatment/Interventions:      OT Goals(Current goals can be found in the care plan section) Acute Rehab OT Goals Patient Stated Goal: to go home OT Goal Formulation: With patient Time For Goal Achievement: 08/25/20 Potential to Achieve Goals: Good  OT Frequency:     Barriers to D/C:            Co-evaluation              AM-PAC OT "6 Clicks" Daily Activity     Outcome Measure Help from another person eating meals?: None Help from another person taking care of personal grooming?: None Help from another person toileting, which includes using toliet, bedpan, or urinal?: None Help from another person bathing (including washing, rinsing, drying)?: None Help from another person to put on and taking off regular upper body clothing?: None Help from another person to put on and taking off regular lower body clothing?: None 6 Click Score: 24   End of Session    Activity Tolerance: Patient tolerated treatment well Patient left: in bed;with call bell/phone within reach  OT Visit Diagnosis: Other symptoms and signs involving the nervous system (R29.898);Other abnormalities of gait and mobility (R26.89)                Time: 1400-1417 OT Time Calculation (min): 17 min Charges:  OT General Charges $OT Visit: 1 Visit OT Evaluation $OT Eval Low Complexity: 1 Low OT Treatments $Self Care/Home Management : 8-22 mins  Josiah Lobo, PhD, MS, OTR/L 08/11/20, 4:06 PM

## 2020-08-11 NOTE — Progress Notes (Signed)
SLP Cancellation Note  Patient Details Name: Kim Gomez MRN: [id number] DOB: 1955/11/04   Cancelled treatment:       Reason Eval/Treat Not Completed: SLP screened, no needs identified, will sign off (chart reviewed; consulted NSG then met w/ pt in room). Pt denied any difficulty swallowing and is currently on a regular diet; tolerates swallowing pills w/ water per NSG. Pt feeding self her Lunch while in the room. Pt conversed in conversation w/out expressive/receptive deficits noted; pt denied any speech-language deficits. Speech clear. No further skilled ST services indicated as pt appears at her baseline. Pt agreed. NSG to reconsult if any change in status while admitted.       Orinda Kenner, MS, CCC-SLP Speech Language Pathologist Rehab Services [phone] [facility] 08/11/2020, 1:48 PM

## 2020-08-11 NOTE — ED Notes (Signed)
Pt ambulatory to restroom with steady gait.

## 2020-08-12 DIAGNOSIS — I639 Cerebral infarction, unspecified: Secondary | ICD-10-CM | POA: Diagnosis not present

## 2020-08-12 DIAGNOSIS — U071 COVID-19: Secondary | ICD-10-CM

## 2020-08-12 LAB — CBC WITH DIFFERENTIAL/PLATELET
Abs Immature Granulocytes: 0.12 10*3/uL — ABNORMAL HIGH (ref 0.00–0.07)
Basophils Absolute: 0.1 10*3/uL (ref 0.0–0.1)
Basophils Relative: 1 %
Eosinophils Absolute: 0.2 10*3/uL (ref 0.0–0.5)
Eosinophils Relative: 3 %
HCT: 38.1 % (ref 36.0–46.0)
Hemoglobin: 13 g/dL (ref 12.0–15.0)
Immature Granulocytes: 1 %
Lymphocytes Relative: 17 %
Lymphs Abs: 1.5 10*3/uL (ref 0.7–4.0)
MCH: 31.3 pg (ref 26.0–34.0)
MCHC: 34.1 g/dL (ref 30.0–36.0)
MCV: 91.6 fL (ref 80.0–100.0)
Monocytes Absolute: 0.7 10*3/uL (ref 0.1–1.0)
Monocytes Relative: 7 %
Neutro Abs: 6.3 10*3/uL (ref 1.7–7.7)
Neutrophils Relative %: 71 %
Platelets: 183 10*3/uL (ref 150–400)
RBC: 4.16 MIL/uL (ref 3.87–5.11)
RDW: 13.5 % (ref 11.5–15.5)
WBC: 8.9 10*3/uL (ref 4.0–10.5)
nRBC: 0 % (ref 0.0–0.2)

## 2020-08-12 LAB — BASIC METABOLIC PANEL
Anion gap: 7 (ref 5–15)
BUN: 16 mg/dL (ref 8–23)
CO2: 27 mmol/L (ref 22–32)
Calcium: 9.2 mg/dL (ref 8.9–10.3)
Chloride: 103 mmol/L (ref 98–111)
Creatinine, Ser: 0.99 mg/dL (ref 0.44–1.00)
GFR, Estimated: >60 mL/min (ref >60)
Glucose, Bld: 110 mg/dL — ABNORMAL HIGH (ref 70–99)
Potassium: 4 mmol/L (ref 3.5–5.1)
Sodium: 137 mmol/L (ref 135–145)

## 2020-08-12 MED ORDER — ATORVASTATIN CALCIUM 80 MG PO TABS: 80.0000 mg | ORAL_TABLET | Freq: Every day | ORAL | 0 refills | Status: AC

## 2020-08-12 MED ORDER — AMLODIPINE BESYLATE 5 MG PO TABS
2.5000 mg | ORAL_TABLET | Freq: Every day | ORAL | Status: DC
  Administered 2020-08-12: 2.5 mg via ORAL
  Filled 2020-08-12: qty 1

## 2020-08-12 MED ORDER — CLOPIDOGREL BISULFATE 75 MG PO TABS: 75.0000 mg | ORAL_TABLET | Freq: Every day | ORAL | 0 refills | Status: AC

## 2020-08-12 MED ORDER — AMLODIPINE BESYLATE 2.5 MG PO TABS: 2.5000 mg | ORAL_TABLET | Freq: Every day | ORAL | 0 refills | Status: AC

## 2020-08-12 MED ORDER — ASPIRIN 81 MG PO CHEW: 81.0000 mg | CHEWABLE_TABLET | Freq: Every day | ORAL | 0 refills | Status: AC

## 2020-08-12 NOTE — Progress Notes (Signed)
Patient OOF in stable condition. Verbalized understanding of all d/c instructions.

## 2020-08-12 NOTE — TOC Initial Note (Signed)
Transition of Care [facility]) - Initial/Assessment Note    Patient Details  Name: Kim Gomez MRN: [id number] Date of Birth: 1955-06-10  [facility] CM/SW Contact:    Shelbie Hutching, RN Phone Number: 08/12/2020, 1:02 PM  Clinical Narrative:                 Patient admitted to the hospital with acute stroke and COVID.  RNCM was able to speak with patient via phone about discharge planning.  Patient lives at home with her husband, he will be picking her up today.  Patient is independent at home.  PT is recommending OP Therapy but patient at this time does not think she needs it.  Patient was wanting to know how to get a new PCP, she has one but it is her gynecologist, and she would like to have an internal medicine doctor for her primary care physician.  Patient is interested in [facility] here in [place].  RNCM instructed patient to call [facility] internal medicine department and tell them that she is looking for a primary care provider and do they have any physicians accepting new patients.  Patient verbalizes understanding and will call.     Expected Discharge Plan: Home/Self Care Barriers to Discharge: Barriers Resolved   Patient Goals and CMS Choice Patient states their goals for this hospitalization and ongoing recovery are:: Glad to be going home      Expected Discharge Plan and Services Expected Discharge Plan: Home/Self Care   Discharge Planning Services: CM Consult   Living arrangements for the past 2 months: Single Family Home Expected Discharge Date: 08/12/20               DME Arranged: N/A DME Agency: NA       HH Arranged: NA HH Agency: NA        Prior Living Arrangements/Services Living arrangements for the past 2 months: Single Family Home Lives with:: Spouse Patient language and need for interpreter reviewed:: Yes Do you feel safe going back to the place where you live?: Yes      Need for Family Participation in Patient Care: Yes  (Comment) (stroke) Care giver support system in place?: Yes (comment) (husband)   Criminal Activity/Legal Involvement Pertinent to Current Situation/Hospitalization: No - Comment as needed  Activities of Daily Living Home Assistive Devices/Equipment: None ADL Screening (condition at time of admission) Patient's cognitive ability adequate to safely complete daily activities?: Yes Is the patient deaf or have difficulty hearing?: No Does the patient have difficulty seeing, even when wearing glasses/contacts?: No Does the patient have difficulty concentrating, remembering, or making decisions?: No Patient able to express need for assistance with ADLs?: Yes Does the patient have difficulty dressing or bathing?: No Independently performs ADLs?: Yes (appropriate for developmental age) Does the patient have difficulty walking or climbing stairs?: No Weakness of Legs: None Weakness of Arms/Hands: None  Permission Sought/Granted Permission sought to share information with : Case Manager, Family Supports Permission granted to share information with : Yes, Verbal Permission Granted  Share Information with NAME: Sonia Side     Permission granted to share info w Relationship: husband     Emotional Assessment   Attitude/Demeanor/Rapport: Engaged Affect (typically observed): Accepting Orientation: : Oriented to Self, Oriented to Place, Oriented to  Time, Oriented to Situation Alcohol / Substance Use: Not Applicable Psych Involvement: No (comment)  Admission diagnosis:  Neck pain [M54.2] Dizziness [R42] CVA (cerebral vascular accident) ([place]) [I63.9] Stroke ([place]) [I63.9] Acute stroke  due to ischemia 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

## 2020-08-12 NOTE — Discharge Summary (Signed)
Physician Discharge Summary  [name] [id number] DOB: April 28, 1955 DOA: 08/10/2020  PCP: Elgie Collard, MD  Admit date: 08/10/2020 Discharge date: 08/12/2020  Admitted From: Home Discharge disposition: Home with outpatient PT   Code Status: Full Code   Discharge Diagnosis:   Active Problems:   Acute stroke due to ischemia [place])    Chief Complaint  Patient presents with   Neck Pain   Brief narrative: Kim Gomez is a 65 y.o. female with PMH significant for HTN, HLD, anxiety disorder, recently hospitalized for COVID-19 infection (08/06/2020) at an outside facility.  Patient was brought to the ED on 7/24 after she had a sudden onset of numbness involving right side of her body without any motor deficits, dysarthria, dysphagia.  Symptoms persisted for several hours and hence patient came to the ED beyond tPA window.  Admits to noncompliance with aspirin and blood pressure medicines  In the ED, patient was afebrile, blood pressure elevated to 181/95. Labs with potassium low at 3.3. COVID PCR positive. MRI brain showed an acute left thalamic/internal capsule infarct with moderate chronic small vessel ischemic disease.  MRA head and neck did not show any major intracranial large vessel occlusion.  Subjective: Patient was seen and examined this morning. Sitting up in chair.  Not in distress.  Numbness has essentially resolved except for on the right side of her face.  Assessment/Plan: Acute Left thalamic ischemic stroke -Presented with several hours of numbness on the right side of the body without motor deficits or other focal neurological deficits -MRI brain with acute left thalamic/internal capsule infarct with moderate small vessel ischemic disease -Admitted for stroke work-up.  -Neurology consulted.  -Lipid panel with HDL 42, LDL elevated to 143 -A1c 5.7 -Echocardiogram with EF 65 to 70%, mild LVH, grade 1 diastolic dysfunction. -Carotid duplex with  no significant stenosis on either ICA. -Prior to admission, patient was supposed to be on aspirin but reportedly noncompliant. -As recommended by neurology, patient should be on aspirin 81 mg daily for lifelong as well as Plavix 75 mg daily for next 3 weeks.  Continue Lipitor.. -Outpatient PT recommended by physical therapy.  Accelerated hypertension -Blood pressure elevated to 170s and 180s. -Reportedly noncompliant to blood pressure medicines. -Amlodipine 2.5 mg daily resumed this morning.  Blood pressure improving.   Recent diagnosis of COVID-19 positive -Currently asymptomatic.  She is up-to-date with her COVID vaccination and has been boosted.  -No need of treatment for COVID at this time.   Anxiety disorder -Continue Celexa.  Hypokalemia -Potassium improved after replacement. Recent Labs  Lab 08/10/20 1346 08/11/20 0636 08/12/20 0445  K 3.2* 3.3* 4.0    Allergies as of 08/12/2020   No Known Allergies      Medication List     STOP taking these medications    lovastatin 20 MG tablet Commonly known as: MEVACOR   traZODone 100 MG tablet Commonly known as: DESYREL       TAKE these medications    amLODipine 2.5 MG tablet Commonly known as: NORVASC Take 1 tablet (2.5 mg total) by mouth daily.   aspirin 81 MG chewable tablet Chew 1 tablet (81 mg total) by mouth daily. What changed: when to take this   atorvastatin 80 MG tablet Commonly known as: LIPITOR Take 1 tablet (80 mg total) by mouth at bedtime.   CALCIUM 500 +D PO Take 1 tablet by mouth daily.   clopidogrel 75 MG tablet Commonly known as: PLAVIX Take 1 tablet (75 mg total) by  mouth daily for 21 days. Start taking on: August 13, 2020   escitalopram 20 MG tablet Commonly known as: LEXAPRO Take 20 mg by mouth daily.   estradiol 1 MG tablet Commonly known as: ESTRACE Take 1 mg by mouth daily.   medroxyPROGESTERone 10 MG tablet Commonly known as: PROVERA Take 5 mg by mouth at bedtime.         Discharge Instructions:  Diet Recommendation: Cardiac diet   Follow with Primary MD Elgie Collard, MD in 7 days   Get CBC/BMP checked in next visit within 1 week by PCP or SNF MD ( we routinely change or add medications that can affect your baseline labs and fluid status, therefore we recommend that you get the mentioned basic workup next visit with your PCP, your PCP may decide not to get them or add new tests based on their clinical decision)  On your next visit with your PCP, please Get Medicines reviewed and adjusted.  Please request your PCP  to go over all Hospital Tests and Procedure/Radiological results at the follow up, please get all Hospital records sent to your Prim MD by signing hospital release before you go home.  Activity: As tolerated with Full fall precautions use walker/cane & assistance as needed  For Heart failure patients - Check your Weight same time everyday, if you gain over 2 pounds, or you develop in leg swelling, experience more shortness of breath or chest pain, call your Primary MD immediately. Follow Cardiac Low Salt Diet and 1.5 lit/day fluid restriction.  If you have smoked or chewed Tobacco in the last 2 yrs please stop smoking, stop any regular Alcohol  and or any Recreational drug use.  If you experience worsening of your admission symptoms, develop shortness of breath, life threatening emergency, suicidal or homicidal thoughts you must seek medical attention immediately by calling 911 or calling your MD immediately  if symptoms less severe.  You Must read complete instructions/literature along with all the possible adverse reactions/side effects for all the Medicines you take and that have been prescribed to you. Take any new Medicines after you have completely understood and accpet all the possible adverse reactions/side effects.   Do not drive, operate heavy machinery, perform activities at heights, swimming or participation in water  activities or provide baby sitting services if your were admitted for syncope or siezures until you have seen by Primary MD or a Neurologist and advised to do so again.  Do not drive when taking Pain medications.  Do not take more than prescribed Pain, Sleep and Anxiety Medications  Wear Seat belts while driving.   Please note You were cared for by a hospitalist during your hospital stay. If you have any questions about your discharge medications or the care you received while you were in the hospital after you are discharged, you can call the unit and asked to speak with the hospitalist on call if the hospitalist that took care of you is not available. Once you are discharged, your primary care physician will handle any further medical issues. Please note that NO REFILLS for any discharge medications will be authorized once you are discharged, as it is imperative that you return to your primary care physician (or establish a relationship with a primary care physician if you do not have one) for your aftercare needs so that they can reassess your need for medications and monitor your lab values.    Follow ups:    Follow-up Information  Elgie Collard, MD Follow up.   Specialty: Obstetrics and Gynecology Contact information: [name] [address] [phone]         Elgie Collard, MD Follow up.   Specialty: Obstetrics and Gynecology Contact information: [name] [address] [phone]                 Wound care:   Incision (Closed) 06/27/15 Rectum Other (Comment) (Active)  Date First Assessed/Time First Assessed: 06/27/15 0955   Location: Rectum  Location Orientation: Other (Comment)    No assessment data to display     No Linked orders to display     Incision (Closed) 09/10/19 Hip Right (Active)  Date First Assessed/Time First Assessed: 09/10/19 1549   Location: Hip  Location Orientation:  Right    Assessments 09/10/2019  4:44 PM 09/11/2019  9:14 PM  Dressing Type Other (Comment) --  Dressing Clean;Dry;Intact Clean;Dry;Intact  Site / Wound Assessment Dressing in place / Unable to assess Dressing in place / Unable to assess  Drainage Amount None None     No Linked orders to display    Discharge Exam:   Vitals:   08/12/20 0101 08/12/20 0402 08/12/20 0800 08/12/20 1135  BP: (!) 179/88 (!) 159/76 140/70 (!) 155/77  Pulse: 71 77 79 77  Resp: '16 16 18 18  '$ Temp: 98.2 F (36.8 C) 98.4 F (36.9 C) 98.5 F (36.9 C) 98.2 F (36.8 C)  TempSrc: Oral Oral Oral Oral  SpO2: 100% 99% 99% 99%  Weight:      Height:        Body mass index is 30.04 kg/m.  General exam: Pleasant, middle-aged African-American female.  Not in distress Skin: No rashes, lesions or ulcers. HEENT: Atraumatic, normocephalic, no obvious bleeding Lungs: Clear to auscultation bilaterally CVS: Regular rate and rhythm, no murmur GI/Abd soft, nontender, nondistended, bowel sound present CNS: Alert, awake, oriented x3 Psychiatry: Mood appropriate Extremities: No pedal edema, no calf tenderness  Time coordinating discharge: 35 minutes   The results of significant diagnostics from this hospitalization (including imaging, microbiology, ancillary and laboratory) are listed below for reference.    Procedures and Diagnostic Studies:   ECHOCARDIOGRAM COMPLETE  Result Date: 08/11/2020    ECHOCARDIOGRAM REPORT   Patient Name:   BRENA GAVIGAN Date of Exam: 08/11/2020 Medical Rec #:  [id number]         Height:       64.0 in Accession #:    [id number]        Weight:       175.0 lb Date of Birth:  10-10-1955          BSA:          1.848 m Patient Age:    55 years          BP:           179/97 mmHg Patient Gender: F                 HR:           82 bpm. Exam Location:  [facility] Procedure: 2D Echo, Cardiac Doppler and Color Doppler Indications:     Stroke I63.9  History:         Patient has no prior history of  Echocardiogram examinations.                  Risk Factors:Hypertension.  Sonographer:     Sherrie Sport  RDCS (AE) Referring Phys:  [id number] [place] Diagnosing Phys: Nelva Bush MD  Sonographer Comments: Suboptimal apical window. IMPRESSIONS  1. Left ventricular ejection fraction, by estimation, is 65 to 70%. The left ventricle has normal function. The left ventricle has no regional wall motion abnormalities. There is mild left ventricular hypertrophy. Left ventricular diastolic parameters are consistent with Grade I diastolic dysfunction (impaired relaxation).  2. Right ventricular systolic function is normal. The right ventricular size is normal. Tricuspid regurgitation signal is inadequate for assessing PA pressure.  3. The mitral valve is normal in structure. Trivial mitral valve regurgitation. No evidence of mitral stenosis.  4. The aortic valve is tricuspid. Aortic valve regurgitation is not visualized. No aortic stenosis is present.  5. The inferior vena cava is normal in size with greater than 50% respiratory variability, suggesting right atrial pressure of 3 mmHg. FINDINGS  Left Ventricle: Left ventricular ejection fraction, by estimation, is 65 to 70%. The left ventricle has normal function. The left ventricle has no regional wall motion abnormalities. The left ventricular internal cavity size was normal in size. There is  mild left ventricular hypertrophy. Left ventricular diastolic parameters are consistent with Grade I diastolic dysfunction (impaired relaxation). Right Ventricle: The right ventricular size is normal. No increase in right ventricular wall thickness. Right ventricular systolic function is normal. Tricuspid regurgitation signal is inadequate for assessing PA pressure. Left Atrium: Left atrial size was normal in size. Right Atrium: Right atrial size was normal in size. Pericardium: There is no evidence of pericardial effusion. Mitral Valve: The mitral valve is normal in  structure. Trivial mitral valve regurgitation. No evidence of mitral valve stenosis. Tricuspid Valve: The tricuspid valve is not well visualized. Tricuspid valve regurgitation is trivial. Aortic Valve: The aortic valve is tricuspid. Aortic valve regurgitation is not visualized. No aortic stenosis is present. Aortic valve mean gradient measures 3.0 mmHg. Aortic valve peak gradient measures 5.4 mmHg. Aortic valve area, by VTI measures 2.42 cm. Pulmonic Valve: The pulmonic valve was not well visualized. Pulmonic valve regurgitation is not visualized. No evidence of pulmonic stenosis. Aorta: The aortic root is normal in size and structure. Pulmonary Artery: The pulmonary artery is not well seen. Venous: The inferior vena cava is normal in size with greater than 50% respiratory variability, suggesting right atrial pressure of 3 mmHg. IAS/Shunts: The interatrial septum was not well visualized.  LEFT VENTRICLE PLAX 2D LVIDd:         3.88 cm  Diastology LVIDs:         2.07 cm  LV e' medial:    5.33 cm/s LV PW:         1.38 cm  LV E/e' medial:  10.9 LV IVS:        1.21 cm  LV e' lateral:   8.49 cm/s LVOT diam:     2.00 cm  LV E/e' lateral: 6.9 LV SV:         44 LV SV Index:   24 LVOT Area:     3.14 cm  RIGHT VENTRICLE RV S prime:     15.00 cm/s TAPSE (M-mode): 3.3 cm LEFT ATRIUM             Index       RIGHT ATRIUM           Index LA diam:        2.80 cm 1.51 cm/m  RA Area:     11.90 cm LA Vol (A2C):   32.1 ml 17.37  ml/m RA Volume:   26.70 ml  14.44 ml/m LA Vol (A4C):   45.0 ml 24.34 ml/m LA Biplane Vol: 38.2 ml 20.67 ml/m  AORTIC VALVE                   PULMONIC VALVE AV Area (Vmax):    1.93 cm    PV Vmax:        0.75 m/s AV Area (Vmean):   1.96 cm    PV Peak grad:   2.3 mmHg AV Area (VTI):     2.42 cm    RVOT Peak grad: 3 mmHg AV Vmax:           116.00 cm/s AV Vmean:          82.500 cm/s AV VTI:            0.183 m AV Peak Grad:      5.4 mmHg AV Mean Grad:      3.0 mmHg LVOT Vmax:         71.10 cm/s LVOT Vmean:         51.500 cm/s LVOT VTI:          0.141 m LVOT/AV VTI ratio: 0.77  AORTA Ao Root diam: 3.30 cm MITRAL VALVE MV Area (PHT): 4.60 cm    SHUNTS MV Decel Time: 165 msec    Systemic VTI:  0.14 m MV E velocity: 58.30 cm/s  Systemic Diam: 2.00 cm MV A velocity: 96.80 cm/s MV E/A ratio:  0.60 Harrell Gave End MD Electronically signed by Nelva Bush MD Signature Date/Time: 08/11/2020/5:03:45 PM    Final      Labs:   Basic Metabolic Panel: Recent Labs  Lab 08/10/20 1346 08/11/20 0636 08/12/20 0445  NA 137 137 137  K 3.2* 3.3* 4.0  CL 103 104 103  CO2 '27 24 27  '$ GLUCOSE 96 81 110*  BUN '13 12 16  '$ CREATININE 0.84 0.78 0.99  CALCIUM 9.2 8.8* 9.2   GFR Estimated Creatinine Clearance: 58.5 mL/min (by C-G formula based on SCr of 0.99 mg/dL). Liver Function Tests: Recent Labs  Lab 08/10/20 1346  AST 19  ALT 18  ALKPHOS 56  BILITOT 0.5  PROT 7.5  ALBUMIN 3.9   No results for input(s): LIPASE, AMYLASE in the last 168 hours. No results for input(s): AMMONIA in the last 168 hours. Coagulation profile Recent Labs  Lab 08/10/20 1758  INR 1.0    CBC: Recent Labs  Lab 08/10/20 1346 08/12/20 0445  WBC 7.2 8.9  NEUTROABS 3.9 6.3  HGB 13.2 13.0  HCT 38.9 38.1  MCV 90.9 91.6  PLT 199 183   Cardiac Enzymes: No results for input(s): CKTOTAL, CKMB, CKMBINDEX, TROPONINI in the last 168 hours. BNP: Invalid input(s): POCBNP CBG: No results for input(s): GLUCAP in the last 168 hours. D-Dimer No results for input(s): DDIMER in the last 72 hours. Hgb A1c Recent Labs    08/11/20 0636  HGBA1C 5.7*   Lipid Profile Recent Labs    08/11/20 0636  CHOL 220*  HDL 42  LDLCALC 143*  TRIG 176*  CHOLHDL 5.2   Thyroid function studies No results for input(s): TSH, T4TOTAL, T3FREE, THYROIDAB in the last 72 hours.  Invalid input(s): FREET3 Anemia work up No results for input(s): VITAMINB12, FOLATE, FERRITIN, TIBC, IRON, RETICCTPCT in the last 72 hours. Microbiology Recent Results  (from the past 240 hour(s))  Resp Panel by RT-PCR (Flu A&B, Covid) Nasopharyngeal Swab     Status: Abnormal   Collection  Time: 08/10/20  7:43 PM   Specimen: Nasopharyngeal Swab; Nasopharyngeal(NP) swabs in vial transport medium  Result Value Ref Range Status   SARS Coronavirus 2 by RT PCR POSITIVE (A) NEGATIVE Final    Comment: RESULT CALLED TO, READ BACK BY AND VERIFIED WITH: JENNIFER GREGORY @  2048 08/10/20 L[facility] (NOTE) SARS-CoV-2 target nucleic acids are DETECTED.  The SARS-CoV-2 RNA is generally detectable in upper respiratory specimens during the acute phase of infection. Positive results are indicative of the presence of the identified virus, but do not rule out bacterial infection or co-infection with other pathogens not detected by the test. Clinical correlation with patient history and other diagnostic information is necessary to determine patient infection status. The expected result is Negative.  Fact Sheet for Patients: EntrepreneurPulse.com.au  Fact Sheet for Healthcare Providers: IncredibleEmployment.be  This test is not yet approved or cleared by the [place] FDA and  has been authorized for detection and/or diagnosis of SARS-CoV-2 by FDA under an Emergency Use Authorization (EUA).  This EUA will remain in effect (meaning this test ca n be used) for the duration of  the COVID-19 declaration under Section 564(b)(1) of the Act, 21 U.S.C. section 360bbb-3(b)(1), unless the authorization is terminated or revoked sooner.     Influenza A by PCR NEGATIVE NEGATIVE Final   Influenza B by PCR NEGATIVE NEGATIVE Final    Comment: (NOTE) The Xpert Xpress SARS-CoV-2/FLU/RSV plus assay is intended as an aid in the diagnosis of influenza from Nasopharyngeal swab specimens and should not be used as a sole basis for treatment. Nasal washings and aspirates are unacceptable for Xpert Xpress SARS-CoV-2/FLU/RSV testing.  Fact Sheet for  Patients: EntrepreneurPulse.com.au  Fact Sheet for Healthcare Providers: IncredibleEmployment.be  This test is not yet approved or cleared by the [place] FDA and has been authorized for detection and/or diagnosis of SARS-CoV-2 by FDA under an Emergency Use Authorization (EUA). This EUA will remain in effect (meaning this test can be used) for the duration of the COVID-19 declaration under Section 564(b)(1) of the Act, 21 U.S.C. section 360bbb-3(b)(1), unless the authorization is terminated or revoked.  Performed at [facility], [address] [place], [place] [address]      Signed: Terrilee Croak  [facility] 08/12/2020, 11:52 AM

## 2020-10-28 ENCOUNTER — Other Ambulatory Visit: Payer: Self-pay | Admitting: Internal Medicine

## 2020-10-28 DIAGNOSIS — R071 Chest pain on breathing: Secondary | ICD-10-CM

## 2020-10-30 ENCOUNTER — Ambulatory Visit
Admission: RE | Admit: 2020-10-30 | Discharge: 2020-10-30 | Disposition: A | Discharge status: Home / Self Care | Payer: No Typology Code available for payment source | Source: Ambulatory Visit | Attending: Internal Medicine | Admitting: Internal Medicine

## 2020-10-30 DIAGNOSIS — R071 Chest pain on breathing: Secondary | ICD-10-CM | POA: Diagnosis not present

## 2020-10-30 LAB — NM MYOCAR MULTI W/SPECT W/WALL MOTION / EF
Estimated workload: 1
Exercise duration (sec): 53 s
LV dias vol: 62 mL (ref 46–106)
LV sys vol: 22 mL
MPHR: 156 {beats}/min
Nuc Stress EF: 65 %
Peak HR: 111 {beats}/min
Percent HR: 71 %
Rest HR: 75 {beats}/min
Rest Nuclear Isotope Dose: 10.6 mCi
SDS: 0
SRS: 10
SSS: 0
ST Depression (mm): 0 mm
Stress Nuclear Isotope Dose: 31.1 mCi
TID: 0.83

## 2020-10-30 MED ORDER — TECHNETIUM TC 99M TETROFOSMIN IV KIT
10.0000 | PACK | Freq: Once | INTRAVENOUS | Status: AC | PRN
  Administered 2020-10-30: 10.6 via INTRAVENOUS

## 2020-10-30 MED ORDER — TECHNETIUM TC 99M TETROFOSMIN IV KIT
30.0000 | PACK | Freq: Once | INTRAVENOUS | Status: AC | PRN
  Administered 2020-10-30: 31.1 via INTRAVENOUS

## 2020-10-30 MED ORDER — REGADENOSON 0.4 MG/5ML IV SOLN
0.4000 mg | Freq: Once | INTRAVENOUS | Status: AC
  Administered 2020-10-30: 0.4 mg via INTRAVENOUS
  Filled 2020-10-30: qty 5

## 2020-12-15 ENCOUNTER — Other Ambulatory Visit: Payer: Self-pay | Admitting: Sports Medicine

## 2020-12-15 DIAGNOSIS — M778 Other enthesopathies, not elsewhere classified: Secondary | ICD-10-CM

## 2020-12-15 DIAGNOSIS — S46011A Strain of muscle(s) and tendon(s) of the rotator cuff of right shoulder, initial encounter: Secondary | ICD-10-CM

## 2020-12-15 DIAGNOSIS — W19XXXA Unspecified fall, initial encounter: Secondary | ICD-10-CM

## 2020-12-15 DIAGNOSIS — M25511 Pain in right shoulder: Secondary | ICD-10-CM

## 2020-12-23 ENCOUNTER — Other Ambulatory Visit: Payer: Self-pay

## 2020-12-23 ENCOUNTER — Ambulatory Visit: Payer: No Typology Code available for payment source | Attending: Neurology | Admitting: Speech Pathology

## 2020-12-23 DIAGNOSIS — R41841 Cognitive communication deficit: Secondary | ICD-10-CM | POA: Insufficient documentation

## 2020-12-23 DIAGNOSIS — R4701 Aphasia: Secondary | ICD-10-CM | POA: Insufficient documentation

## 2020-12-23 NOTE — Patient Instructions (Signed)
Consider getting a dayplanner to help you write down/organize your schedule.  Try making a checklist to use in the kitchen when you are cooking. Write down the steps before you start and cross things off as you go.   Tips to help facilitate better attention, concentration, focus   Do harder, longer tasks when you are most alert/awake  Break down larger tasks into small parts  Limit distractions of TV, radio, conversation, e mails/texts, appliance noise, etc - if a job is important, do it in a quiet room  Be aware of how you are functioning in high stimulation environments such as large stores, parties, restaurants - any place with lots of lights, noise, signs etc  Group conversations may be more difficult to process than one on one conversations  Give yourself extra time to process conversation, reading materials, directions or information from your healthcare providers  Organization is key - clutters of laundry, mail, paperwork, dirty dishes - all make it more difficult to concentrate  Before you start a task, have all the needed supplies, directions, recipes ready and organized. This way you don't have to go looking for something in the middle of a task and become distracted.   Be aware of fatigue - take rests or breaks when needed to re-group and re-focus

## 2020-12-24 NOTE — Therapy (Signed)
[place] MAIN [facility] SERVICES [address] [place], [name], [address] Phone: [phone]   Fax:  [phone]  Speech Language Pathology Evaluation  Patient Details  Name: Kim Gomez MRN: [id number] Date of Birth: May 31, 1955 Referring Provider (SLP): Dr. Jennings Books   Encounter Date: 12/23/2020   End of Session - 12/24/20 1546     Visit Number 1    Number of Visits 25    Date for SLP Re-Evaluation 03/24/21    SLP Start Time 1000    SLP Stop Time  1100    SLP Time Calculation (min) 60 min    Activity Tolerance Patient tolerated treatment well             Past Medical History:  Diagnosis Date   Anxiety    Arthritis    legs, hands   Cellulitis of right knee    started on antibiotics 06/21/15.     Depression    Family history of adverse reaction to anesthesia    sister and mom -PONV   Hypercholesteremia    Hypertension     Past Surgical History:  Procedure Laterality Date   ABDOMINAL HYSTERECTOMY     COLONOSCOPY WITH PROPOFOL N/A 06/27/2015   Procedure: COLONOSCOPY WITH PROPOFOL;  Surgeon: Lucilla Lame, MD;  Location: [facility];  Service: Endoscopy;  Laterality: N/A;   POLYPECTOMY  06/27/2015   Procedure: POLYPECTOMY;  Surgeon: Lucilla Lame, MD;  Location: [place];  Service: Endoscopy;;   TOTAL HIP ARTHROPLASTY Right 09/10/2019   Procedure: TOTAL HIP ARTHROPLASTY ANTERIOR APPROACH;  Surgeon: Lovell Sheehan, MD;  Location: [facility];  Service: Orthopedics;  Laterality: Right;    There were no vitals filed for this visit.   Subjective Assessment - 12/23/20 1110     Subjective "My words won't come out when I'm thinking and it takes me a while to grasp what I'm saying."    Currently in Pain? No/denies   facial numbness (right)               SLP Evaluation [facility] - 12/24/20 0001       SLP Visit Information   SLP Received On 12/23/20    Referring Provider (SLP) Dr. Jennings Books    Onset Date  08/10/20    Medical Diagnosis CVA, long covid      Pain Assessment   Currently in Pain? No/denies      General Information   HPI Patient is a 65 y.o. female who admitted 08/10/20 and found to have Covid-19 as well as left ischemic thalamic/internal capsule infarct. Acute SLP screened and s/o; pt now with residual cognitive fog, imbalance, fatigue, right first + second digit numbness.    Behavioral/Cognition alert, pleasant      Balance Screen   Has the patient fallen in the past 6 months Yes    How many times? 1   pt eval pending     Prior Functional Status   Cognitive/Linguistic Baseline Within functional limits    Type of Home House    Vocation On disability      Cognition   Overall Cognitive Status Impaired/Different from baseline    Attention Selective    Selective Attention Impaired   slow processing; has more difficulty with noise/auditory distractions   Memory Impaired    Memory Impairment Decreased short term memory;Prospective memory   forgets if she doesnt write down appointments, errands, grocery items   Decreased Short Term Memory Functional basic  Awareness Appears intact    Problem Solving Impaired    Problem Solving Impairment Verbal complex;Functional complex   slow processing   Executive Function Sequencing;Organizing   difficulty with time management, planning   Sequencing Impaired    Sequencing Impairment Functional complex   difficulty sequencing tasks when cooking   Organizing Impaired    Organizing Impairment Verbal complex   reports difficulty organizing her thoughts; circumlocutory at times     Auditory Comprehension   Overall Auditory Comprehension Other (comment)   needs repetition, chunking of information due to slow processing   Yes/No Questions Within Functional Limits    Commands Within Functional Limits    Conversation Moderately complex    Other Conversation Comments needs repetition occasionally    Interfering Components Processing speed     EffectiveTechniques Extra processing time;Repetition;Stressing words      Visual Recognition/Discrimination   Discrimination Within Function Limits      Reading Comprehension   Reading Status Unable to assess (comment)   time constraints; pt reports difficulty; will assess further     Expression   Primary Mode of Expression Verbal      Verbal Expression   Overall Verbal Expression Impaired    Initiation No impairment    Automatic Speech Name;Social Response    Level of Generative/Spontaneous Verbalization Conversation    Interfering Components Attention    Non-Verbal Means of Communication Not applicable    Other Verbal Expression Comments circumlocutory in conversation, wordfinding difficulties, hesitation, paraphasias (flog/fog)      Written Expression   Dominant Hand Right    Written Expression Unable to assess (comment)   time constraints; pt reports difficulty; will assess further     Oral Motor/Sensory Function   Overall Oral Motor/Sensory Function Impaired   right facial/labial numbness     Motor Speech   Overall Motor Speech Appears within functional limits for tasks assessed      Standardized Assessments   Standardized Assessments  Cognitive Linguistic Quick Test                Cognitive Linguistic Quick Test: AGE - 18 - 69   The Cognitive Linguistic Quick Test (CLQT) was administered to assess the relative status of five cognitive domains: attention, memory, language, executive functioning, and visuospatial skills. Scores from 10 tasks were used to estimate severity ratings (standardized for age groups 18-69 years and 70-89 years) for each domain, a clock drawing task, as well as an overall composite severity rating of cognition.       Task Score Criterion Cut Scores  Personal Facts 8/8 8  Symbol Cancellation 12/12 11  Confrontation Naming 10/10 10  Clock Drawing  11/13 12  Story Retelling 5/10 6  Symbol Trails 10/10 9  Generative Naming 5/9 5  Design  Memory 5/6 5  Mazes  4/8 7  Design Generation 4/13 6    Cognitive Domain Composite Score Severity Rating  Attention 178/215 Mild  Memory 141/185 Mild  Executive Function 23/40 Mild  Language 28/37 Mild  Visuospatial Skills 80/105 Mild  Clock Drawing  11/13 Mild  Composite Severity Rating  Mild                        SLP Education - 12/24/20 1546     Education Details proposed SLP POC/ goals, attention strategies    Person(s) Educated Patient    Methods Explanation    Comprehension Verbalized understanding  SLP Short Term Goals - 12/24/20 1555       SLP SHORT TERM GOAL #1   Title Pt will verbalize and demonstrate how to implement 2 memory and attention strategies to aid daily functioning given occasional min A over 2 sessions    Time 10    Period --   sessions   Status New      SLP SHORT TERM GOAL #2   Title Pt will manage finances with use of compensations with no reported episodes of missed bills given rare min A    Time 10    Period --   sessions   Status New      SLP SHORT TERM GOAL #3   Title Pt will identify errors in writing and self-correct with 80% accuracy given rare min A over 2 sessions    Time 10    Period --   sessions   Status New      SLP SHORT TERM GOAL #4   Title Pt will participate in further language assessment as needed (goals to be added)    Time 10    Period --   sessions   Status New              SLP Long Term Goals - 12/24/20 1559       SLP LONG TERM GOAL #1   Title Pt will verbalize and implement 4 attention and memory strategies to aid daily functioning for ADLs and IADLs with rare min A over 2 sessions    Time 12    Period Weeks    Status New    Target Date 03/24/20      SLP LONG TERM GOAL #2   Title Pt will identify and correct paraphasias in conversation and writing with 90% accuracy given rare min A over 2 sessions    Time 12    Period Weeks    Status New    Target Date 03/24/20       SLP LONG TERM GOAL #3   Title Pt will use compensations for anomia, thought organization effectively in 20 minutes mod complex conversation.    Time 12    Period Weeks    Status New    Target Date 03/24/20              Plan - 12/24/20 1547     Clinical Impression Statement Patient presents with mild cognitive communication and mild language impairments s/p CVA. The Cognitive Linguistic Quick Test was administered, with pt scoring in mild range of impairment across all domains including attention, memory, language, executive functioning, visuospatial skills, and clock drawing. Patient demonstrates relative strength in awareness; she was noted to take her time and double check her work on all tasks. At times she ran out of time on tasks (mazes), and demonstrated slow processing with need for repetition of instructions at times. Verbal communication is characterized by slow but fluent, circumlocutory speech, with wordfinding deficits and occasional paraphasias in conversation. Pt reports difficulty reading and writing; to be assessed in subsequent sessions. Pt reports functional impacts including difficulty with time management and sequencing during IADLs such as cooking and shopping, misplacing items, forgetting appointments or grocery items if she does not write them down, difficulty texting and trouble reading her Bible. I recommend skilled ST to maximize pt's cognitive communication and language skills to improve accuracy/independence with ADLs and IADLs, reduce communication breakdowns, and improve quality of life.    Speech Therapy Frequency 2x / week  Duration 12 weeks    Treatment/Interventions Language facilitation;Environmental controls;SLP instruction and feedback;Cognitive reorganization;Compensatory strategies;Patient/family education;Internal/external aids    Potential to Achieve Goals Good    SLP Home Exercise Plan see pt instructions    Consulted and Agree with Plan of Care  Patient             Patient will benefit from skilled therapeutic intervention in order to improve the following deficits and impairments:   Cognitive communication deficit  Aphasia    Problem List Patient Active Problem List   Diagnosis Date Noted   Acute stroke due to ischemia ([place]) 08/10/2020   History of total hip replacement, right 09/10/2019   Special screening for malignant neoplasms, colon    Benign neoplasm of ascending colon    Benign neoplasm of descending colon    Deneise Lever, [place], CCC-SLP Speech-Language Pathologist  Aliene Altes, [place] 12/24/2020, 4:56 PM  [facility] [address] [place], [name], [address] Phone: [phone]   Fax:  [phone]  Name: MONSERRATE BLASCHKE MRN: [id number] Date of Birth: 1955-04-16

## 2020-12-25 ENCOUNTER — Ambulatory Visit: Payer: No Typology Code available for payment source | Admitting: Speech Pathology

## 2020-12-25 ENCOUNTER — Other Ambulatory Visit: Payer: Self-pay

## 2020-12-25 DIAGNOSIS — R4701 Aphasia: Secondary | ICD-10-CM

## 2020-12-25 DIAGNOSIS — R41841 Cognitive communication deficit: Secondary | ICD-10-CM

## 2020-12-25 NOTE — Therapy (Signed)
[place] MAIN [facility] SERVICES [address] [place], [name], [address] Phone: [phone]   Fax:  [phone]  Speech Language Pathology Treatment  Patient Details  Name: Kim Gomez MRN: [id number] Date of Birth: [birth year] Referring Provider (SLP): Dr. Jennings Books   Encounter Date: 12/25/2020   End of Session - 12/25/20 1556     Visit Number 2    Number of Visits 25    Date for SLP Re-Evaluation 03/24/21    SLP Start Time 1500    SLP Stop Time  1550    SLP Time Calculation (min) 50 min    Activity Tolerance Patient tolerated treatment well             Past Medical History:  Diagnosis Date   Anxiety    Arthritis    legs, hands   Cellulitis of right knee    started on antibiotics 06/21/15.     Depression    Family history of adverse reaction to anesthesia    sister and mom -PONV   Hypercholesteremia    Hypertension     Past Surgical History:  Procedure Laterality Date   ABDOMINAL HYSTERECTOMY     COLONOSCOPY WITH PROPOFOL N/A 06/27/2015   Procedure: COLONOSCOPY WITH PROPOFOL;  Surgeon: Lucilla Lame, MD;  Location: [place];  Service: Endoscopy;  Laterality: N/A;   POLYPECTOMY  06/27/2015   Procedure: POLYPECTOMY;  Surgeon: Lucilla Lame, MD;  Location: [facility];  Service: Endoscopy;;   TOTAL HIP ARTHROPLASTY Right 09/10/2019   Procedure: TOTAL HIP ARTHROPLASTY ANTERIOR APPROACH;  Surgeon: Lovell Sheehan, MD;  Location: [facility];  Service: Orthopedics;  Laterality: Right;    There were no vitals filed for this visit.   Subjective Assessment - 12/25/20 1550     Subjective "It seems like everything is moving in slow motion."    Currently in Pain? No/denies                   ADULT SLP TREATMENT - 12/25/20 1551       General Information   Behavior/Cognition Alert;Cooperative;Pleasant mood    HPI Patient is a 65 y.o. female who admitted 08/10/20 and found to have Covid-19 as well as left  ischemic thalamic/internal capsule infarct. Acute SLP screened and s/o; pt now with residual cognitive fog, imbalance, fatigue, right first + second digit numbness.      Treatment Provided   Treatment provided Cognitive-Linquistic      Pain Assessment   Pain Assessment No/denies pain      Cognitive-Linquistic Treatment   Treatment focused on Cognition;Patient/family/caregiver education    Skilled Treatment Patient brought therapy calendar and previous session's handout in a folder. She reports she reviewed strategies and found some of the information helpful, such as taking breaks. Worked with pt to ID strategies to help her be more successful in IADLs. 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 Suggested arranging her list by section prior to going to the store to help her retrive items more quickly. Introduced use of daily planner for Nurse, learning disability. Pt recalled and wrote novel events from today with extended time. Mod cues to ID upcoming tasks to add, such as a dinner she has planned with her sister. Pt could not recall the time for certain. Encouraged her to write events down in the moment for better recall of details. Pt made note to reschedule her shoulder MRI appt. Preplanned conversation by  taking notes with important details to facilitate better communication on the phone with schedulers.      Assessment / Recommendations / Plan   Plan Continue with current plan of care      Progression Toward Goals   Progression toward goals Progressing toward goals              SLP Education - 12/25/20 1556     Education Details preplanning for outings/phone calls, energy conservation    Person(s) Educated Patient    Methods Explanation    Comprehension Verbalized understanding;Need further instruction              SLP Short Term Goals - 12/24/20 1555       SLP SHORT TERM GOAL #1   Title Pt will verbalize and demonstrate how to  implement 2 memory and attention strategies to aid daily functioning given occasional min A over 2 sessions    Time 10    Period --   sessions   Status New      SLP SHORT TERM GOAL #2   Title Pt will manage finances with use of compensations with no reported episodes of missed bills given rare min A    Time 10    Period --   sessions   Status New      SLP SHORT TERM GOAL #3   Title Pt will identify errors in writing and self-correct with 80% accuracy given rare min A over 2 sessions    Time 10    Period --   sessions   Status New      SLP SHORT TERM GOAL #4   Title Pt will participate in further language assessment as needed (goals to be added)    Time 10    Period --   sessions   Status New              SLP Long Term Goals - 12/24/20 1559       SLP LONG TERM GOAL #1   Title Pt will verbalize and implement 4 attention and memory strategies to aid daily functioning for ADLs and IADLs with rare min A over 2 sessions    Time 12    Period Weeks    Status New    Target Date 03/24/20      SLP LONG TERM GOAL #2   Title Pt will identify and correct paraphasias in conversation and writing with 90% accuracy given rare min A over 2 sessions    Time 12    Period Weeks    Status New    Target Date 03/24/20      SLP LONG TERM GOAL #3   Title Pt will use compensations for anomia, thought organization effectively in 20 minutes mod complex conversation.    Time 12    Period Weeks    Status New    Target Date 03/24/20              Plan - 12/25/20 1556     Clinical Impression Statement Patient presents with mild cognitive communication and mild language impairments s/p CVA. Deficits include attention, memory, language, executive functioning and visuospatial skills. Verbal communication is characterized by slow but fluent, circumlocutory speech, with wordfinding deficits and occasional paraphasias in conversation. Pt reports difficulty reading and writing; to be assessed in  subsequent sessions. Pt reports functional impacts including difficulty with time management and sequencing during IADLs such as cooking and shopping, misplacing items, forgetting appointments or grocery items if  she does not write them down, difficulty texting and trouble reading her Bible. I recommend skilled ST to maximize pt's cognitive communication and language skills to improve accuracy/independence with ADLs and IADLs, reduce communication breakdowns, and improve quality of life.    Speech Therapy Frequency 2x / week    Duration 12 weeks    Treatment/Interventions Language facilitation;Environmental controls;SLP instruction and feedback;Cognitive reorganization;Compensatory strategies;Patient/family education;Internal/external aids    Potential to Achieve Goals Good    SLP Home Exercise Plan see pt instructions    Consulted and Agree with Plan of Care Patient             Patient will benefit from skilled therapeutic intervention in order to improve the following deficits and impairments:   Cognitive communication deficit  Aphasia    Problem List Patient Active Problem List   Diagnosis Date Noted   Acute stroke due to ischemia ([place]) 08/10/2020   History of total hip replacement, right 09/10/2019   Special screening for malignant neoplasms, colon    Benign neoplasm of ascending colon    Benign neoplasm of descending colon    Deneise Lever, MS, CCC-SLP Speech-Language Pathologist  Aliene Altes, [place] 12/25/2020, 3:58 PM  [facility] [address] [place], [name], [address] Phone: [phone]   Fax:  [phone]   Name: Kim Gomez MRN: [id number] Date of Birth: 05/22/1955

## 2020-12-31 ENCOUNTER — Ambulatory Visit
Admission: RE | Admit: 2020-12-31 | Discharge: 2020-12-31 | Disposition: A | Discharge status: Home / Self Care | Payer: No Typology Code available for payment source | Source: Ambulatory Visit | Attending: Sports Medicine | Admitting: Sports Medicine

## 2020-12-31 ENCOUNTER — Other Ambulatory Visit: Payer: Self-pay

## 2020-12-31 DIAGNOSIS — M778 Other enthesopathies, not elsewhere classified: Secondary | ICD-10-CM | POA: Insufficient documentation

## 2020-12-31 DIAGNOSIS — S46011A Strain of muscle(s) and tendon(s) of the rotator cuff of right shoulder, initial encounter: Secondary | ICD-10-CM

## 2020-12-31 DIAGNOSIS — M25511 Pain in right shoulder: Secondary | ICD-10-CM | POA: Insufficient documentation

## 2020-12-31 DIAGNOSIS — S46811A Strain of other muscles, fascia and tendons at shoulder and upper arm level, right arm, initial encounter: Secondary | ICD-10-CM | POA: Insufficient documentation

## 2020-12-31 DIAGNOSIS — W19XXXA Unspecified fall, initial encounter: Secondary | ICD-10-CM | POA: Insufficient documentation

## 2021-01-01 ENCOUNTER — Ambulatory Visit: Payer: No Typology Code available for payment source | Admitting: Speech Pathology

## 2021-01-01 DIAGNOSIS — R4701 Aphasia: Secondary | ICD-10-CM

## 2021-01-01 DIAGNOSIS — R41841 Cognitive communication deficit: Secondary | ICD-10-CM

## 2021-01-01 NOTE — Therapy (Signed)
[place] MAIN [facility] SERVICES [address] [place], [name], [address] Phone: [phone]   Fax:  [phone]  Speech Language Pathology Treatment  Patient Details  Name: Kim Gomez MRN: [id number] Date of Birth: Jun 13, 1955 Referring Provider (SLP): Dr. Jennings Books   Encounter Date: 01/01/2021   End of Session - 01/01/21 1231     Visit Number 3    Number of Visits 25    Date for SLP Re-Evaluation 03/24/21    SLP Start Time 1100    SLP Stop Time  1200    SLP Time Calculation (min) 60 min    Activity Tolerance Patient tolerated treatment well             Past Medical History:  Diagnosis Date   Anxiety    Arthritis    legs, hands   Cellulitis of right knee    started on antibiotics 06/21/15.     Depression    Family history of adverse reaction to anesthesia    sister and mom -PONV   Hypercholesteremia    Hypertension     Past Surgical History:  Procedure Laterality Date   ABDOMINAL HYSTERECTOMY     COLONOSCOPY WITH PROPOFOL N/A 06/27/2015   Procedure: COLONOSCOPY WITH PROPOFOL;  Surgeon: Lucilla Lame, MD;  Location: [place];  Service: Endoscopy;  Laterality: N/A;   POLYPECTOMY  06/27/2015   Procedure: POLYPECTOMY;  Surgeon: Lucilla Lame, MD;  Location: [place];  Service: Endoscopy;;   TOTAL HIP ARTHROPLASTY Right 09/10/2019   Procedure: TOTAL HIP ARTHROPLASTY ANTERIOR APPROACH;  Surgeon: Lovell Sheehan, MD;  Location: [facility];  Service: Orthopedics;  Laterality: Right;    There were no vitals filed for this visit.   Subjective Assessment - 01/01/21 1222     Subjective Brought notebook today with journaled activities    Currently in Pain? No/denies                   ADULT SLP TREATMENT - 01/01/21 1222       General Information   Behavior/Cognition Alert;Cooperative;Pleasant mood    HPI Patient is a 65 y.o. female who admitted 08/10/20 and found to have Covid-19 as well as left  ischemic thalamic/internal capsule infarct. Acute SLP screened and s/o; pt now with residual cognitive fog, imbalance, fatigue, right first + second digit numbness.      Cognitive-Linquistic Treatment   Treatment focused on Cognition;Patient/family/caregiver education    Skilled Treatment Pt reports journaling activity has been helpful. With further questioning, pt is using this primarily restrospectively rather than prospectively. Discussed benefits of planning ahead; as pt listed upcoming items she recalled additional tasks she needed to complete and wrote these down as well. Noted that pt had written "August" instead of December for all entries; pt corrected this once it was brought to her attention. Provided meal planning worksheet; pt planned for dinner meal with min cues and listed grocery items needed (forgot buns).      Assessment / Recommendations / Plan   Plan Continue with current plan of care      Progression Toward Goals   Progression toward goals Progressing toward goals              SLP Education - 01/01/21 1230     Education Details preplan vs record daily activities    Person(s) Educated Patient    Methods Explanation    Comprehension Verbalized understanding  SLP Short Term Goals - 12/24/20 1555       SLP SHORT TERM GOAL #1   Title Pt will verbalize and demonstrate how to implement 2 memory and attention strategies to aid daily functioning given occasional min A over 2 sessions    Time 10    Period --   sessions   Status New      SLP SHORT TERM GOAL #2   Title Pt will manage finances with use of compensations with no reported episodes of missed bills given rare min A    Time 10    Period --   sessions   Status New      SLP SHORT TERM GOAL #3   Title Pt will identify errors in writing and self-correct with 80% accuracy given rare min A over 2 sessions    Time 10    Period --   sessions   Status New      SLP SHORT TERM GOAL #4   Title  Pt will participate in further language assessment as needed (goals to be added)    Time 10    Period --   sessions   Status New              SLP Long Term Goals - 12/24/20 1559       SLP LONG TERM GOAL #1   Title Pt will verbalize and implement 4 attention and memory strategies to aid daily functioning for ADLs and IADLs with rare min A over 2 sessions    Time 12    Period Weeks    Status New    Target Date 03/24/20      SLP LONG TERM GOAL #2   Title Pt will identify and correct paraphasias in conversation and writing with 90% accuracy given rare min A over 2 sessions    Time 12    Period Weeks    Status New    Target Date 03/24/20      SLP LONG TERM GOAL #3   Title Pt will use compensations for anomia, thought organization effectively in 20 minutes mod complex conversation.    Time 12    Period Weeks    Status New    Target Date 03/24/20              Plan - 01/01/21 1231     Clinical Impression Statement Patient presents with mild cognitive communication and mild language impairments s/p CVA. Deficits include attention, memory, language, executive functioning and visuospatial skills. Verbal communication is characterized by slow but fluent, circumlocutory speech, with wordfinding deficits and occasional paraphasias in conversation. Pt reports difficulty reading and writing; to be assessed in subsequent sessions. Pt reports functional impacts including difficulty with time management and sequencing during IADLs such as cooking and shopping, misplacing items, forgetting appointments or grocery items if she does not write them down, difficulty texting and trouble reading her Bible. I recommend skilled ST to maximize pt's cognitive communication and language skills to improve accuracy/independence with ADLs and IADLs, reduce communication breakdowns, and improve quality of life.    Speech Therapy Frequency 2x / week    Duration 12 weeks    Treatment/Interventions Language  facilitation;Environmental controls;SLP instruction and feedback;Cognitive reorganization;Compensatory strategies;Patient/family education;Internal/external aids    Potential to Achieve Goals Good    SLP Home Exercise Plan see pt instructions    Consulted and Agree with Plan of Care Patient             Patient  will benefit from skilled therapeutic intervention in order to improve the following deficits and impairments:   Cognitive communication deficit  Aphasia    Problem List Patient Active Problem List   Diagnosis Date Noted   Acute stroke due to ischemia ([place]) 08/10/2020   History of total hip replacement, right 09/10/2019   Special screening for malignant neoplasms, colon    Benign neoplasm of ascending colon    Benign neoplasm of descending colon    Deneise Lever, MS, CCC-SLP Speech-Language Pathologist  Aliene Altes, [place] 01/01/2021, 12:31 PM  [facility] [address] [place], [name], [address] Phone: [phone]   Fax:  [phone]   Name: Kim Gomez MRN: [id number] Date of Birth: Mar 25, 1955

## 2021-01-01 NOTE — Patient Instructions (Signed)
SCHEDULE NOTEBOOK Great job writing down your schedule for the day! This week, I want you to work on trying to make your schedule BEFORE it happens. If you think of something you need to do, go ahead and write it down. This will help you remember, and also help you to plan the rest of your day.  MEAL PLANNING WORKSHEET Try to make a plan for your meals each week. Write down what you think you'd like to do for dinner on each day (even if it's going out to eat). Write the list of ingredients you'll need to purchase at the store. It might help you to fill this out in your kitchen, where you can check to see if you already have the items in your freezer or pantry. If you need to remember to take out something from the freezer, write it down on your to-do list in your schedule notebook! (Like "take out chicken breast")

## 2021-01-06 ENCOUNTER — Other Ambulatory Visit: Payer: Self-pay

## 2021-01-06 ENCOUNTER — Ambulatory Visit: Payer: No Typology Code available for payment source | Admitting: Speech Pathology

## 2021-01-06 DIAGNOSIS — R41841 Cognitive communication deficit: Secondary | ICD-10-CM

## 2021-01-06 NOTE — Patient Instructions (Signed)
Great work with your notebook! Keep it with you in your purse so you have it with you. Whenever you think of something you need to do or remember, WRITE IT DOWN right away. Don't try to remember to remember.   Try to plan out your menu for the week on Sunday. Even if you are having leftovers or getting takeout, write it down. If you are choosing to cook something, use the back of your notebook to write down the steps. Use the notebook to check things off as you cook. This will help you stay focused.

## 2021-01-06 NOTE — Therapy (Signed)
[place] MAIN [facility] SERVICES [address] [place], [name], [address] Phone: [phone]   Fax:  [phone]  Speech Language Pathology Treatment  Patient Details  Name: Kim Gomez MRN: [id number] Date of Birth: [birth year] Referring Provider (SLP): Dr. Jennings Books   Encounter Date: 01/06/2021   End of Session - 01/06/21 1215     Visit Number 4    Number of Visits 25    Date for SLP Re-Evaluation 03/24/21    SLP Start Time 1100    SLP Stop Time  1150    SLP Time Calculation (min) 50 min    Activity Tolerance Patient tolerated treatment well             Past Medical History:  Diagnosis Date   Anxiety    Arthritis    legs, hands   Cellulitis of right knee    started on antibiotics 06/21/15.     Depression    Family history of adverse reaction to anesthesia    sister and mom -PONV   Hypercholesteremia    Hypertension     Past Surgical History:  Procedure Laterality Date   ABDOMINAL HYSTERECTOMY     COLONOSCOPY WITH PROPOFOL N/A 06/27/2015   Procedure: COLONOSCOPY WITH PROPOFOL;  Surgeon: Lucilla Lame, MD;  Location: [place];  Service: Endoscopy;  Laterality: N/A;   POLYPECTOMY  06/27/2015   Procedure: POLYPECTOMY;  Surgeon: Lucilla Lame, MD;  Location: [place];  Service: Endoscopy;;   TOTAL HIP ARTHROPLASTY Right 09/10/2019   Procedure: TOTAL HIP ARTHROPLASTY ANTERIOR APPROACH;  Surgeon: Lovell Sheehan, MD;  Location: [facility];  Service: Orthopedics;  Laterality: Right;    There were no vitals filed for this visit.   Subjective Assessment - 01/06/21 1108     Subjective Gets out her notebook    Currently in Pain? No/denies                   ADULT SLP TREATMENT - 01/06/21 1108       General Information   Behavior/Cognition Alert;Cooperative;Pleasant mood    HPI Patient is a 65 y.o. female who admitted 08/10/20 and found to have Covid-19 as well as left ischemic thalamic/internal  capsule infarct. Acute SLP screened and s/o; pt now with residual cognitive fog, imbalance, fatigue, right first + second digit numbness.      Cognitive-Linquistic Treatment   Treatment focused on Cognition;Patient/family/caregiver education    Skilled Treatment Pt reports she has been writing in her notebook daily to log/ plan activities; nothing yet written for today, however. Pt generated her to-do list for today. Pt identified she made errors when writing (spelling, date); SLP noted this was when talking and writing at the same time. Education to complete one thing at a time vs dual tasking to reduce errors. Pt reports meals planned out in session last time went well, however she did not plan any meals for this week. Reports feeling overwhelmed when it is time to cook. Worked with pt to develop strategies and routine for preplanning her meals and shopping list, and writing out steps of her meal plan. Pt generated step-by-step instructions for dinner next 2 nights in her notebook with occasional min-mod cues, extra time. Pt to begin keeping her notebook in her purse so it is available when she needs to review or add information. Reinforced need to write down reminders at the time she remembers vs trying to recall to do this later.  Assessment / Recommendations / Plan   Plan Continue with current plan of care      Progression Toward Goals   Progression toward goals Progressing toward goals              SLP Education - 01/06/21 1325     Education Details preplanning; write down reminders immediately vs waiting    Person(s) Educated Patient    Methods Explanation    Comprehension Verbalized understanding              SLP Short Term Goals - 12/24/20 1555       SLP SHORT TERM GOAL #1   Title Pt will verbalize and demonstrate how to implement 2 memory and attention strategies to aid daily functioning given occasional min A over 2 sessions    Time 10    Period --   sessions    Status New      SLP SHORT TERM GOAL #2   Title Pt will manage finances with use of compensations with no reported episodes of missed bills given rare min A    Time 10    Period --   sessions   Status New      SLP SHORT TERM GOAL #3   Title Pt will identify errors in writing and self-correct with 80% accuracy given rare min A over 2 sessions    Time 10    Period --   sessions   Status New      SLP SHORT TERM GOAL #4   Title Pt will participate in further language assessment as needed (goals to be added)    Time 10    Period --   sessions   Status New              SLP Long Term Goals - 12/24/20 1559       SLP LONG TERM GOAL #1   Title Pt will verbalize and implement 4 attention and memory strategies to aid daily functioning for ADLs and IADLs with rare min A over 2 sessions    Time 12    Period Weeks    Status New    Target Date 03/24/20      SLP LONG TERM GOAL #2   Title Pt will identify and correct paraphasias in conversation and writing with 90% accuracy given rare min A over 2 sessions    Time 12    Period Weeks    Status New    Target Date 03/24/20      SLP LONG TERM GOAL #3   Title Pt will use compensations for anomia, thought organization effectively in 20 minutes mod complex conversation.    Time 12    Period Weeks    Status New    Target Date 03/24/20              Plan - 01/06/21 1215     Clinical Impression Statement Patient presents with mild cognitive communication and mild language impairments s/p CVA. Deficits include attention, memory, language, executive functioning and visuospatial skills. Pt learning to use compensations including preplanning for daily schedule, shopping and meal preparation however requires moderate cues for this. Pt reports difficulty reading and writing; to be assessed in subsequent sessions. Pt reports functional impacts including difficulty with time management and sequencing during IADLs such as cooking and shopping,  misplacing items, forgetting appointments or grocery items if she does not write them down, difficulty texting and trouble reading her Bible. I recommend skilled ST to  maximize pt's cognitive communication and language skills to improve accuracy/independence with ADLs and IADLs, reduce communication breakdowns, and improve quality of life.    Speech Therapy Frequency 2x / week    Duration 12 weeks    Treatment/Interventions Language facilitation;Environmental controls;SLP instruction and feedback;Cognitive reorganization;Compensatory strategies;Patient/family education;Internal/external aids    Potential to Achieve Goals Good    SLP Home Exercise Plan see pt instructions    Consulted and Agree with Plan of Care Patient             Patient will benefit from skilled therapeutic intervention in order to improve the following deficits and impairments:   Cognitive communication deficit    Problem List Patient Active Problem List   Diagnosis Date Noted   Acute stroke due to ischemia ([place]) 08/10/2020   History of total hip replacement, right 09/10/2019   Special screening for malignant neoplasms, colon    Benign neoplasm of ascending colon    Benign neoplasm of descending colon    Deneise Lever, [place], CCC-SLP Speech-Language Pathologist  Aliene Altes, [place] 01/06/2021, 1:26 PM  [facility] [address] [place], [name], [address] Phone: [phone]   Fax:  [phone]   Name: Kim Gomez MRN: [id number] Date of Birth: 1955/08/30

## 2021-01-07 ENCOUNTER — Ambulatory Visit: Payer: No Typology Code available for payment source | Admitting: Speech Pathology

## 2021-01-07 DIAGNOSIS — R41841 Cognitive communication deficit: Secondary | ICD-10-CM

## 2021-01-07 NOTE — Therapy (Signed)
[place] MAIN [facility] SERVICES [address] [place], [name], [address] Phone: [phone]   Fax:  [phone]  Speech Language Pathology Treatment  Patient Details  Name: ADJA RUFF MRN: [id number] Date of Birth: 07/31/1955 Referring Provider (SLP): Dr. Jennings Books   Encounter Date: 01/07/2021   End of Session - 01/07/21 1538     Visit Number 5    Number of Visits 25    Date for SLP Re-Evaluation 03/24/21    SLP Start Time 1405    SLP Stop Time  1500    SLP Time Calculation (min) 55 min    Activity Tolerance Patient tolerated treatment well             Past Medical History:  Diagnosis Date   Anxiety    Arthritis    legs, hands   Cellulitis of right knee    started on antibiotics 06/21/15.     Depression    Family history of adverse reaction to anesthesia    sister and mom -PONV   Hypercholesteremia    Hypertension     Past Surgical History:  Procedure Laterality Date   ABDOMINAL HYSTERECTOMY     COLONOSCOPY WITH PROPOFOL N/A 06/27/2015   Procedure: COLONOSCOPY WITH PROPOFOL;  Surgeon: Lucilla Lame, MD;  Location: [place];  Service: Endoscopy;  Laterality: N/A;   POLYPECTOMY  06/27/2015   Procedure: POLYPECTOMY;  Surgeon: Lucilla Lame, MD;  Location: [place];  Service: Endoscopy;;   TOTAL HIP ARTHROPLASTY Right 09/10/2019   Procedure: TOTAL HIP ARTHROPLASTY ANTERIOR APPROACH;  Surgeon: Lovell Sheehan, MD;  Location: [facility];  Service: Orthopedics;  Laterality: Right;    There were no vitals filed for this visit.   Subjective Assessment - 01/07/21 1410     Subjective "I went to bed early."    Currently in Pain? Yes    Pain Score 8     Pain Location Shoulder    Pain Orientation Right                   ADULT SLP TREATMENT - 01/07/21 1411       General Information   Behavior/Cognition Alert;Cooperative;Pleasant mood    HPI Patient is a 65 y.o. female who admitted 08/10/20 and  found to have Covid-19 as well as left ischemic thalamic/internal capsule infarct. Acute SLP screened and s/o; pt now with residual cognitive fog, imbalance, fatigue, right first + second digit numbness.      Cognitive-Linquistic Treatment   Treatment focused on Cognition;Patient/family/caregiver education    Skilled Treatment Pt reports preplanning her grocery list; added all items for dinner and for salad she is preparing for Christmas and purchased these yesterday. Reports her sister called and asked if pt could pick her up today at noon. Pt almost forgot this; remembered right before when she was in the shower. "I should have wrote it down right then." Assessed reading and writing today using portions of Western Aphasia Battery part 2. Scores as follows: READING: Comprehension of Sentences: 32/40, Reading Commands: 19/20. Spelled word recognition: 6/6, Spelling: 6/6. WRITING: Writing Upon Request: 6/6, Writing Output 30/34 (more fragments than complete sentences), Writing to Dictation: 6/10. Writing irregular words: 10/10      Assessment / Recommendations / Plan   Plan Continue with current plan of care      Progression Toward Goals   Progression toward goals Progressing toward goals  SLP Education - 01/07/21 1538     Education Details slow processing/attention can affect reading/writing abilities    Person(s) Educated Patient    Methods Explanation    Comprehension Verbalized understanding              SLP Short Term Goals - 12/24/20 1555       SLP SHORT TERM GOAL #1   Title Pt will verbalize and demonstrate how to implement 2 memory and attention strategies to aid daily functioning given occasional min A over 2 sessions    Time 10    Period --   sessions   Status New      SLP SHORT TERM GOAL #2   Title Pt will manage finances with use of compensations with no reported episodes of missed bills given rare min A    Time 10    Period --   sessions   Status  New      SLP SHORT TERM GOAL #3   Title Pt will identify errors in writing and self-correct with 80% accuracy given rare min A over 2 sessions    Time 10    Period --   sessions   Status New      SLP SHORT TERM GOAL #4   Title Pt will participate in further language assessment as needed (goals to be added)    Time 10    Period --   sessions   Status New              SLP Long Term Goals - 12/24/20 1559       SLP LONG TERM GOAL #1   Title Pt will verbalize and implement 4 attention and memory strategies to aid daily functioning for ADLs and IADLs with rare min A over 2 sessions    Time 12    Period Weeks    Status New    Target Date 03/24/20      SLP LONG TERM GOAL #2   Title Pt will identify and correct paraphasias in conversation and writing with 90% accuracy given rare min A over 2 sessions    Time 12    Period Weeks    Status New    Target Date 03/24/20      SLP LONG TERM GOAL #3   Title Pt will use compensations for anomia, thought organization effectively in 20 minutes mod complex conversation.    Time 12    Period Weeks    Status New    Target Date 03/24/20              Plan - 01/07/21 1538     Clinical Impression Statement Patient presents with mild cognitive communication impairment. Deficits include attention, memory, language, executive functioning and visuospatial skills. Assessed reading/writing today; deficits appear related to processing/attention vs language impairment. Pt reports functional impacts including difficulty with time management and sequencing during IADLs such as cooking and shopping, misplacing items, forgetting appointments or grocery items if she does not write them down, difficulty texting and trouble reading her Bible. I recommend skilled ST to maximize pt's cognitive communication and language skills to improve accuracy/independence with ADLs and IADLs, reduce communication breakdowns, and improve quality of life.    Speech Therapy  Frequency 2x / week    Duration 12 weeks    Treatment/Interventions Language facilitation;Environmental controls;SLP instruction and feedback;Cognitive reorganization;Compensatory strategies;Patient/family education;Internal/external aids    Potential to Achieve Goals Good    SLP Home Exercise Plan see pt instructions  Consulted and Agree with Plan of Care Patient             Patient will benefit from skilled therapeutic intervention in order to improve the following deficits and impairments:   Cognitive communication deficit    Problem List Patient Active Problem List   Diagnosis Date Noted   Acute stroke due to ischemia ([place]) 08/10/2020   History of total hip replacement, right 09/10/2019   Special screening for malignant neoplasms, colon    Benign neoplasm of ascending colon    Benign neoplasm of descending colon    Deneise Lever, MS, CCC-SLP Speech-Language Pathologist  Aliene Altes, [place] 01/07/2021, 3:40 PM  [facility] [address] [place], [name], [address] Phone: [phone]   Fax:  [phone]   Name: GENNA CASIMIR MRN: [id number] Date of Birth: [birth year]

## 2021-01-07 NOTE — Patient Instructions (Signed)
Practice reading your book. Make sure to reduce distractions (turn off TV, let your husband know not to interrupt you). Start for short periods of time (5-10 minutes). After you finish reading, jot down a few notes about what you read. Review your notes before you start reading again.   Bring your notes and your book with you next time :)

## 2021-01-13 ENCOUNTER — Ambulatory Visit: Payer: No Typology Code available for payment source | Admitting: Speech Pathology

## 2021-01-15 ENCOUNTER — Ambulatory Visit: Payer: No Typology Code available for payment source | Admitting: Speech Pathology

## 2021-01-20 ENCOUNTER — Ambulatory Visit: Payer: No Typology Code available for payment source | Admitting: Speech Pathology

## 2021-01-20 ENCOUNTER — Ambulatory Visit: Payer: No Typology Code available for payment source | Attending: Neurology | Admitting: Physical Therapy

## 2021-01-20 ENCOUNTER — Encounter: Payer: Self-pay | Admitting: Physical Therapy

## 2021-01-20 ENCOUNTER — Other Ambulatory Visit: Payer: Self-pay

## 2021-01-20 DIAGNOSIS — R2681 Unsteadiness on feet: Secondary | ICD-10-CM | POA: Insufficient documentation

## 2021-01-20 DIAGNOSIS — R41841 Cognitive communication deficit: Secondary | ICD-10-CM | POA: Insufficient documentation

## 2021-01-20 DIAGNOSIS — R269 Unspecified abnormalities of gait and mobility: Secondary | ICD-10-CM | POA: Insufficient documentation

## 2021-01-20 DIAGNOSIS — R4701 Aphasia: Secondary | ICD-10-CM | POA: Diagnosis present

## 2021-01-20 DIAGNOSIS — R278 Other lack of coordination: Secondary | ICD-10-CM | POA: Diagnosis present

## 2021-01-20 DIAGNOSIS — M6281 Muscle weakness (generalized): Secondary | ICD-10-CM | POA: Diagnosis present

## 2021-01-20 NOTE — Therapy (Signed)
[place] MAIN [facility] SERVICES [address] [place], [name], [address] Phone: [phone]   Fax:  [phone]  Speech Language Pathology Treatment  Patient Details  Name: Kim Gomez MRN: [id number] Date of Birth: [birth year] Referring Provider (SLP): Dr. Jennings Books   Encounter Date: 01/20/2021   End of Session - 01/20/21 1354     Visit Number 6    Number of Visits 25    Date for SLP Re-Evaluation 03/24/21    SLP Start Time 0900    SLP Stop Time  1000    SLP Time Calculation (min) 60 min    Activity Tolerance Patient tolerated treatment well             Past Medical History:  Diagnosis Date   Anxiety    Arthritis    legs, hands   Cellulitis of right knee    started on antibiotics 06/21/15.     Depression    Family history of adverse reaction to anesthesia    sister and mom -PONV   Hypercholesteremia    Hypertension     Past Surgical History:  Procedure Laterality Date   ABDOMINAL HYSTERECTOMY     COLONOSCOPY WITH PROPOFOL N/A 06/27/2015   Procedure: COLONOSCOPY WITH PROPOFOL;  Surgeon: Lucilla Lame, MD;  Location: [place];  Service: Endoscopy;  Laterality: N/A;   POLYPECTOMY  06/27/2015   Procedure: POLYPECTOMY;  Surgeon: Lucilla Lame, MD;  Location: [place];  Service: Endoscopy;;   TOTAL HIP ARTHROPLASTY Right 09/10/2019   Procedure: TOTAL HIP ARTHROPLASTY ANTERIOR APPROACH;  Surgeon: Lovell Sheehan, MD;  Location: [facility];  Service: Orthopedics;  Laterality: Right;    There were no vitals filed for this visit.   Subjective Assessment - 01/20/21 0911     Subjective "I forgot my appointment, I'm so sorry."    Currently in Pain? No/denies                   ADULT SLP TREATMENT - 01/20/21 0911       General Information   Behavior/Cognition Alert;Cooperative;Pleasant mood    HPI Patient is a 66 y.o. female who admitted 08/10/20 and found to have Covid-19 as well as left ischemic  thalamic/internal capsule infarct. Acute SLP screened and s/o; pt now with residual cognitive fog, imbalance, fatigue, right first + second digit numbness.      Treatment Provided   Treatment provided Cognitive-Linquistic      Cognitive-Linquistic Treatment   Treatment focused on Cognition;Patient/family/caregiver education    Skilled Treatment Patient forgot her appointment last week (27th). Reports she is typically more "emotional" and distracted during the holidays as she grieves loss of her father and brother. Pt brought several notes today, including planner worksheets, a journal she has been logging activities in, her therapy calendars, and a Surveyor, [employer] (month view only). Discussed that having information in too many places may be distracting for pt, and worked to revise system with her. Pt felt that her monthly planner is what she references most often. Pt updated her appointments with occasional mod cues. Also demonstrated functional ways pt can use the notes and  priorities sections of this planner to help her recall her to-do list. Pt listed 2 tasks she wanted to complete today (ask sister to assist with transporting mother to an appointment, and call to ask her MD whether labs are required for her next appointment. Pt reports feeling fatigued and more confused if she has too much to  do in one day; education on energy conservation and using schedule to manage her tasks.      Assessment / Recommendations / Plan   Plan Continue with current plan of care      Progression Toward Goals   Progression toward goals Progressing toward goals              SLP Education - 01/20/21 1354     Education Details energy conservation    Person(s) Educated Patient    Methods Explanation    Comprehension Verbalized understanding              SLP Short Term Goals - 12/24/20 1555       SLP SHORT TERM GOAL #1   Title Pt will verbalize and demonstrate how to implement 2 memory and  attention strategies to aid daily functioning given occasional min A over 2 sessions    Time 10    Period --   sessions   Status New      SLP SHORT TERM GOAL #2   Title Pt will manage finances with use of compensations with no reported episodes of missed bills given rare min A    Time 10    Period --   sessions   Status New      SLP SHORT TERM GOAL #3   Title Pt will identify errors in writing and self-correct with 80% accuracy given rare min A over 2 sessions    Time 10    Period --   sessions   Status New      SLP SHORT TERM GOAL #4   Title Pt will participate in further language assessment as needed (goals to be added)    Time 10    Period --   sessions   Status New              SLP Long Term Goals - 12/24/20 1559       SLP LONG TERM GOAL #1   Title Pt will verbalize and implement 4 attention and memory strategies to aid daily functioning for ADLs and IADLs with rare min A over 2 sessions    Time 12    Period Weeks    Status New    Target Date 03/24/20      SLP LONG TERM GOAL #2   Title Pt will identify and correct paraphasias in conversation and writing with 90% accuracy given rare min A over 2 sessions    Time 12    Period Weeks    Status New    Target Date 03/24/20      SLP LONG TERM GOAL #3   Title Pt will use compensations for anomia, thought organization effectively in 20 minutes mod complex conversation.    Time 12    Period Weeks    Status New    Target Date 03/24/20              Plan - 01/20/21 1355     Clinical Impression Statement Patient presents with mild cognitive communication impairment. Patient is using compensations for recall, however requires cues ongoing training to implement effectively. Demonstrating improved error correction in writing today; continues with hesitations, wordfinding difficulties, particularly under pressure. I recommend skilled ST to maximize pt's cognitive communication and language skills to improve  accuracy/independence with ADLs and IADLs, reduce communication breakdowns, and improve quality of life.    Speech Therapy Frequency 2x / week    Duration 12 weeks    Treatment/Interventions Language  facilitation;Environmental controls;SLP instruction and feedback;Cognitive reorganization;Compensatory strategies;Patient/family education;Internal/external aids    Potential to Achieve Goals Good    SLP Home Exercise Plan see pt instructions    Consulted and Agree with Plan of Care Patient             Patient will benefit from skilled therapeutic intervention in order to improve the following deficits and impairments:   Cognitive communication deficit  Aphasia    Problem List Patient Active Problem List   Diagnosis Date Noted   Acute stroke due to ischemia ([place]) 08/10/2020   History of total hip replacement, right 09/10/2019   Special screening for malignant neoplasms, colon    Benign neoplasm of ascending colon    Benign neoplasm of descending colon    Deneise Lever, [place], CCC-SLP Speech-Language Pathologist  Aliene Altes, [place] 01/20/2021, 1:58 PM  [place] [address] [place], [name], [address] Phone: [phone]   Fax:  [phone]   Name: Kim Gomez MRN: [id number] Date of Birth: Aug 27, 1955

## 2021-01-20 NOTE — Therapy (Signed)
[place] MAIN [facility] SERVICES [address] [place], [name], [address] Phone: [phone]   Fax:  [phone]  Physical Therapy Evaluation  Patient Details  Name: Kim Gomez MRN: [id number] Date of Birth: 03-11-1955 Referring Provider (PT): Jennings Books MD   Encounter Date: 01/20/2021   PT End of Session - 01/20/21 0906     Visit Number 1    Number of Visits 24    Date for PT Re-Evaluation 04/14/21    Progress Note Due on Visit 10    PT Start Time 0801    PT Stop Time 0858    PT Time Calculation (min) 57 min    Equipment Utilized During Treatment Gait belt    Activity Tolerance Patient tolerated treatment well;No increased pain    Behavior During Therapy WFL for tasks assessed/performed             Past Medical History:  Diagnosis Date   Anxiety    Arthritis    legs, hands   Cellulitis of right knee    started on antibiotics 06/21/15.     Depression    Family history of adverse reaction to anesthesia    sister and mom -PONV   Hypercholesteremia    Hypertension     Past Surgical History:  Procedure Laterality Date   ABDOMINAL HYSTERECTOMY     COLONOSCOPY WITH PROPOFOL N/A 06/27/2015   Procedure: COLONOSCOPY WITH PROPOFOL;  Surgeon: Lucilla Lame, MD;  Location: [place];  Service: Endoscopy;  Laterality: N/A;   POLYPECTOMY  06/27/2015   Procedure: POLYPECTOMY;  Surgeon: Lucilla Lame, MD;  Location: [place];  Service: Endoscopy;;   TOTAL HIP ARTHROPLASTY Right 09/10/2019   Procedure: TOTAL HIP ARTHROPLASTY ANTERIOR APPROACH;  Surgeon: Lovell Sheehan, MD;  Location: [facility];  Service: Orthopedics;  Laterality: Right;    There were no vitals filed for this visit.    Subjective Assessment - 01/20/21 0805     Subjective Pt reports a fall on october 29th. Pt rpeorts she hurt the R UE with the fall and she reports some stiffness in the knee as well as general weakness on the right side. Pt reports she  has had 2 strokes but the first went under the radar and was only evidenced by imaging presented to her by her MD. Pt reports she has been modifying her activities in order to be careful not to fall since the stroke. Reports she has been taking her time with a lot of things. Pt reports she has a membership at [employer] and she has been walking on the treadmill 2-3 times per week, she reports she walks on the treadmill for an hour at a time following working her way up. Pt reports ocassional numbness and tingling in her hands.    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 Pt reports a fall on october 29th. Pt rpeorts she hurt the R UE with the fall and she reports some stiffness in the knee as well as general weakness on the right side. Pt reports she has had 2 strokes but the first went under the radar and was only evidenced by imaging presented to her by her MD. Pt reports she has been modifying her activities in order to be careful not to fall since the stroke. Reports she has been taking her time with  a lot of things. Pt reports she has a membership at [employer] and she has been walking on the treadmill 2-3 times per week, she reports she walks on the treadmill for an hour at a time following working her way up. Pt reports ocassional numbness and tingling in her hands.    Limitations House hold activities   cooking, vacuuming   How long can you walk comfortably? 1 hour with UE support    Patient Stated Goals improve strength and balance in the lower extremities.    Currently in Pain? No/denies   none at this time but has right shoulder pain at times               [facility] PT Assessment - 01/20/21 0001       Assessment   Medical Diagnosis Stroke    Referring Provider (PT) Jennings Books MD    Hand Dominance Right    Prior Therapy None for this  problem      Balance Screen   Has the patient fallen in the past 6 months Yes    How many times? 1    Has the patient had a decrease in activity level because of a fear of falling?  Yes    Is the patient reluctant to leave their home because of a fear of falling?  No      Prior Function   Level of Independence Independent      Observation/Other Assessments   Focus on Therapeutic Outcomes (FOTO)  51      Ambulation/Gait   Ambulation/Gait Yes      Balance   Balance Assessed Yes      Standardized Balance Assessment   Standardized Balance Assessment Mini-BESTest      Mini-BESTest   Sit To Stand Normal: Comes to stand without use of hands and stabilizes independently. (P)     Rise to Toes Moderate: Heels up, but not full range (smaller than when holding hands), OR noticeable instability for 3 s. (P)     Stand on one leg (left) Normal: 20 s. (P)     Stand on one leg (right) Normal: 20 s. (P)     Stand on one leg - lowest score 2 (P)     Compensatory Stepping Correction - Forward Normal: Recovers independently with a single, large step (second realignement is allowed). (P)     Compensatory Stepping Correction - Backward Moderate: More than one step is required to recover equilibrium (P)     Compensatory Stepping Correction - Left Lateral Moderate: Several steps to recover equilibrium (P)     Compensatory Stepping Correction - Right Lateral Normal: Recovers independently with 1 step (crossover or lateral OK) (P)     Stepping Corredtion Lateral - lowest score 1 (P)     Stance - Feet together, eyes open, firm surface  Normal: 30s (P)     Stance - Feet together, eyes closed, foam surface  Normal: 30s (P)     Incline - Eyes Closed Normal: Stands independently 30s and aligns with gravity (P)     Change in Gait Speed Normal: Significantly changes walkling speed without imbalance (P)     Walk with head turns - Horizontal Moderate: performs head turns with reduction in gait speed. (P)     Walk  with pivot turns Moderate:Turns with feet close SLOW (>4 steps) with good balance. (P)  Objective measurements completed on examination: See above findings.      STRENGTH:  Graded on a 0-5 scale Muscle Group Left Right  Shoulder flex    Shoulder Abd    Shoulder Ext    Shoulder IR/ER    Elbow    Wrist/hand    Hip Flex 5 4  Hip Abd 5 4  Hip Add    Hip Ext    Hip IR/ER    Knee Flex 5 4  Knee Ext 5 4  Ankle DF 5 4  Ankle PF 5 4    OUTCOME MEASURES: TEST Outcome Interpretation  5 times sit<>stand 32.4 sec >60 yo, >15 sec indicates increased risk for falls  10 meter walk test          11.62       m/s <1.0 m/s indicates increased risk for falls; limited community ambulator  Timed up and Go         17.18        sec <14 sec indicates increased risk for falls  6 minute walk test        FOTO 41 50 goal for improved function                PT Education - 01/20/21 0905     Education Details POC and results with baalnce testing    Person(s) Educated Patient    Methods Explanation    Comprehension Verbalized understanding              PT Short Term Goals - 01/20/21 0915       PT SHORT TERM GOAL #1   Title Patient will be independent in home exercise program to improve strength/mobility for better functional independence with ADLs.    Baseline no HEP at this time    Time 4    Period Weeks    Status New    Target Date 02/17/21               PT Long Term Goals - 01/20/21 0915       PT LONG TERM GOAL #1   Title Patient will increase FOTO score to equal to or greater than  50   to demonstrate statistically significant improvement in mobility and quality of life.    Baseline 41 on 01/20/21    Time 8    Period Weeks    Status New    Target Date 03/17/21      PT LONG TERM GOAL #2   Title Pt will improve mini BEST test by 4 points or more in order to indicate clinically significant improvent in balance.    Baseline  see flowsheets    Time 12    Period Weeks    Status New    Target Date 04/14/21      PT LONG TERM GOAL #3   Title Patient  will complete five times sit to stand test in < 15 seconds indicating an increased LE strength and improved balance.    Baseline see flowsheets    Time 12    Period Weeks    Status New    Target Date 04/14/21      PT LONG TERM GOAL #4   Title Patient will increase 10 meter walk test to >1.35m/s as to improve gait speed for better community ambulation and to reduce fall risk.    Baseline .86 m/s on 01/20/21    Time 8    Period Weeks    Status  New    Target Date 03/17/21      PT LONG TERM GOAL #5   Title Pty will improve dual task TUG to less than 20 seconds in order to indicate improved cognitive and motor tasks.    Baseline 28.82 on 01/20/21    Time 12    Period Weeks    Status New    Target Date 04/14/21                    Plan - 01/20/21 0816     Clinical Impression Statement Pt is a 66 y.o. female with reports of imbalance and one fall following a lefth internal capsule/ thamalic infarct in July 4967. Pt reports since this time her activity has been very limited and she has required help from her husband with several household chores and activities. Pt presents with generalized weakness on the R LE comared to the left as evidenced by MMT. Pt also has some general strenght an muscular power weakness as evidenced by results of 5X STS. In addition pt TUG and 10MWT are below her age matched norms indicating increased risk of falls in the future. Pt will significantly benefit form skilled PT interventions in order to improve her lower extremity strength, balance, mobility, and overal function and QOL.    Personal Factors and Comorbidities Age;Comorbidity 1;Comorbidity 2    Comorbidities arthritis, anxiety, HTN, hyperlipedemia.    Examination-Activity Limitations Carry;Lift;Locomotion Level;Squat;Stairs    Examination-Participation Restrictions  Cleaning;Yard Work;Meal Prep    Stability/Clinical Decision Making Stable/Uncomplicated    Clinical Decision Making Moderate    Rehab Potential Good    PT Frequency 2x / week    PT Duration 12 weeks    PT Treatment/Interventions ADLs/Self Care Home Management;Gait training;Therapeutic activities;Functional mobility training;Therapeutic exercise;Balance training;Neuromuscular re-education;Manual techniques;Energy conservation;Passive range of motion    PT Next Visit Plan HEP initiation    PT Home Exercise Plan Visit 2, dual task, R LE strength, heel/toe raises    Recommended Other Services OT (referal and appointment already made)    Consulted and Agree with Plan of Care Patient             Patient will benefit from skilled therapeutic intervention in order to improve the following deficits and impairments:  Decreased activity tolerance, Decreased strength, Decreased mobility, Decreased balance  Visit Diagnosis: Abnormality of gait and mobility  Unsteadiness on feet     Problem List Patient Active Problem List   Diagnosis Date Noted   Acute stroke due to ischemia ([place]) 08/10/2020   History of total hip replacement, right 09/10/2019   Special screening for malignant neoplasms, colon    Benign neoplasm of ascending colon    Benign neoplasm of descending colon     Particia Lather, PT 01/20/2021, 9:25 AM  [place] [address] [place], [name], [address] Phone: [phone]   Fax:  [phone]  Name: Kim Gomez MRN: [id number] Date of Birth: 05-Oct-1955

## 2021-01-22 ENCOUNTER — Ambulatory Visit: Payer: No Typology Code available for payment source | Admitting: Speech Pathology

## 2021-01-22 ENCOUNTER — Other Ambulatory Visit: Payer: Self-pay

## 2021-01-22 ENCOUNTER — Ambulatory Visit: Payer: No Typology Code available for payment source | Admitting: Physical Therapy

## 2021-01-22 DIAGNOSIS — R269 Unspecified abnormalities of gait and mobility: Secondary | ICD-10-CM

## 2021-01-22 DIAGNOSIS — R41841 Cognitive communication deficit: Secondary | ICD-10-CM

## 2021-01-22 DIAGNOSIS — R2681 Unsteadiness on feet: Secondary | ICD-10-CM

## 2021-01-22 NOTE — Therapy (Signed)
[place] MAIN [facility] SERVICES [address] [place], [name], [address] Phone: [phone]   Fax:  [phone]  Physical Therapy Treatment  Patient Details  Name: Kim Gomez MRN: [id number] Date of Birth: 1955/11/16 Referring Provider (PT): Jennings Books MD   Encounter Date: 01/22/2021   PT End of Session - 01/22/21 0858     Visit Number 2    Number of Visits 24    Date for PT Re-Evaluation 04/14/21    Progress Note Due on Visit 10    PT Start Time 0804    PT Stop Time 0845    PT Time Calculation (min) 41 min    Equipment Utilized During Treatment Gait belt    Activity Tolerance Patient tolerated treatment well;No increased pain    Behavior During Therapy WFL for tasks assessed/performed             Past Medical History:  Diagnosis Date   Anxiety    Arthritis    legs, hands   Cellulitis of right knee    started on antibiotics 06/21/15.     Depression    Family history of adverse reaction to anesthesia    sister and mom -PONV   Hypercholesteremia    Hypertension     Past Surgical History:  Procedure Laterality Date   ABDOMINAL HYSTERECTOMY     COLONOSCOPY WITH PROPOFOL N/A 06/27/2015   Procedure: COLONOSCOPY WITH PROPOFOL;  Surgeon: Lucilla Lame, MD;  Location: [place];  Service: Endoscopy;  Laterality: N/A;   POLYPECTOMY  06/27/2015   Procedure: POLYPECTOMY;  Surgeon: Lucilla Lame, MD;  Location: [place];  Service: Endoscopy;;   TOTAL HIP ARTHROPLASTY Right 09/10/2019   Procedure: TOTAL HIP ARTHROPLASTY ANTERIOR APPROACH;  Surgeon: Lovell Sheehan, MD;  Location: [facility];  Service: Orthopedics;  Laterality: Right;    There were no vitals filed for this visit.   Subjective Assessment - 01/22/21 0805     Subjective no falls, stumbles, LOB since previous appointrment. Pt reports doing well overall.    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 Pt reports a fall on october 29th. Pt rpeorts she hurt the R UE with the fall and she reports some stiffness in the knee as well as general weakness on the right side. Pt reports she has had 2 strokes but the first went under the radar and was only evidenced by imaging presented to her by her MD. Pt reports she has been modifying her activities in order to be careful not to fall since the stroke. Reports she has been taking her time with a lot of things. Pt reports she has a membership at [employer] and she has been walking on the treadmill 2-3 times per week, she reports she walks on the treadmill for an hour at a time following working her way up. Pt reports ocassional numbness and tingling in her hands.    Limitations House hold activities   cooking, vacuuming   How long can you walk comfortably? 1 hour with UE support    Patient Stated Goals improve strength and balance in the lower extremities.             Exercise/Activity Sets/ Reps/Time/ Resistance Assistance Charge type Comments  NBOS - Airex with head turns and nods  3 x 45 sec  Neuro re-ed Increased  difficulty with head nods vs head turns (cervical rotation)   Semitandem - on airex with Dual task (see comments)  2 rounds with each LE post  Neuro re-ed Played tick-tack toe on white board with challenge of reaching to limits of BOS, increased diffiulty with R LE posterior (involved side) and increased difficulty with reaching toward the left (left weight shifting)   Marching - airex  10 x each  Neuro re-ed Improved efficacy with practice   Tandem stance - airex  3 x 45 sec  Neuro re-ed Increased difficulty with R LE posterior   Airex step up -  lateral  10 x ea   Neuro re-ed Cues for large steps, improved efficacy with practice        Heel / Toe rocks  X 20  Therex  - increased difficulty with balance with rocking to heels.                            Treatment provided this session   Pt educated throughout session about proper posture and technique with exercises. Improved exercise technique, movement at target joints, use of target muscles after min to mod verbal, visual, tactile cues. Note: Portions of this document were prepared using Dragon voice recognition software and although reviewed may contain unintentional dictation errors in syntax, grammar, or spelling.                             PT Education - 01/22/21 0858     Education Details HEP,    Person(s) Educated Patient    Methods Explanation;Demonstration;Handout    Comprehension Verbalized understanding;Returned demonstration              PT Short Term Goals - 01/20/21 0915       PT SHORT TERM GOAL #1   Title Patient will be independent in home exercise program to improve strength/mobility for better functional independence with ADLs.    Baseline no HEP at this time    Time 4    Period Weeks    Status New    Target Date 02/17/21               PT Long Term Goals - 01/20/21 0915       PT LONG TERM GOAL #1   Title Patient will increase FOTO score to equal to or greater than  50   to demonstrate statistically significant improvement in mobility and quality of life.    Baseline 41 on 01/20/21    Time 8    Period Weeks    Status New    Target Date 03/17/21      PT LONG TERM GOAL #2   Title Pt will improve mini BEST test by 4 points or more in order to indicate clinically significant improvent in balance.    Baseline see flowsheets    Time 12    Period Weeks    Status New    Target Date 04/14/21      PT LONG TERM GOAL #3   Title Patient  will complete five times sit to stand test in < 15 seconds indicating an increased LE strength and improved balance.    Baseline see flowsheets    Time 12    Period Weeks    Status New    Target Date 04/14/21      PT LONG TERM GOAL #4   Title  Patient will increase 10  meter walk test to >1.63m/s as to improve gait speed for better community ambulation and to reduce fall risk.    Baseline .86 m/s on 01/20/21    Time 8    Period Weeks    Status New    Target Date 03/17/21      PT LONG TERM GOAL #5   Title Pty will improve dual task TUG to less than 20 seconds in order to indicate improved cognitive and motor tasks.    Baseline 28.82 on 01/20/21    Time 12    Period Weeks    Status New    Target Date 04/14/21                   Plan - 01/22/21 0859     Clinical Impression Statement Pt presents with excellent motivation for completion of physical therapy program. Pt was provided and performed initial HEP in todays session and verablized understanding of importance of completion for progress. Pt also performed several balance  and neuromuscular re-education activities in today's session and tolerated them well. Also integrated dual task interventions to increase difficulty as well as to assist p in progressing. Will continue with these types of interventions in future sessions. Pt will continue to benefit from skilled PT intervention to improve her balance, strength and reduce her risk of falls in addition to increased her confidence with everyday activities.    Personal Factors and Comorbidities Age;Comorbidity 1;Comorbidity 2    Comorbidities arthritis, anxiety, HTN, hyperlipedemia.    Examination-Activity Limitations Carry;Lift;Locomotion Level;Squat;Stairs    Examination-Participation Restrictions Cleaning;Yard Work;Meal Prep    Stability/Clinical Decision Making Stable/Uncomplicated    Rehab Potential Good    PT Frequency 2x / week    PT Duration 12 weeks    PT Treatment/Interventions ADLs/Self Care Home Management;Gait training;Therapeutic activities;Functional mobility training;Therapeutic exercise;Balance training;Neuromuscular re-education;Manual techniques;Energy conservation;Passive range of motion    PT Next Visit Plan Continue with activity  progressions, integration of dual tasks    PT Home Exercise Plan Access Code: [email or web address]  URL: [email or web address]    Consulted and Agree with Plan of Care Patient             Patient will benefit from skilled therapeutic intervention in order to improve the following deficits and impairments:  Decreased activity tolerance, Decreased strength, Decreased mobility, Decreased balance  Visit Diagnosis: Abnormality of gait and mobility  Unsteadiness on feet     Problem List Patient Active Problem List   Diagnosis Date Noted   Acute stroke due to ischemia ([place]) 08/10/2020   History of total hip replacement, right 09/10/2019   Special screening for malignant neoplasms, colon    Benign neoplasm of ascending colon    Benign neoplasm of descending colon     Particia Lather, PT 01/22/2021, 9:05 AM  [place], [name], [address] Phone: [phone]   Fax:  [phone]  Name: Kim Gomez MRN: [id number] Date of Birth: 1955-03-09

## 2021-01-22 NOTE — Therapy (Signed)
[place] MAIN [facility] SERVICES [address] [place], [name], [address] Phone: [phone]   Fax:  [phone]  Speech Language Pathology Treatment  Patient Details  Name: Kim Gomez MRN: [id number] Date of Birth: October 23, 1955 Referring Provider (SLP): Dr. Jennings Books   Encounter Date: 01/22/2021   End of Session - 01/22/21 0958     Visit Number 7    Number of Visits 25    Date for SLP Re-Evaluation 03/24/21    SLP Start Time 0900    SLP Stop Time  1000    SLP Time Calculation (min) 60 min    Activity Tolerance Patient tolerated treatment well             Past Medical History:  Diagnosis Date   Anxiety    Arthritis    legs, hands   Cellulitis of right knee    started on antibiotics 06/21/15.     Depression    Family history of adverse reaction to anesthesia    sister and mom -PONV   Hypercholesteremia    Hypertension     Past Surgical History:  Procedure Laterality Date   ABDOMINAL HYSTERECTOMY     COLONOSCOPY WITH PROPOFOL N/A 06/27/2015   Procedure: COLONOSCOPY WITH PROPOFOL;  Surgeon: Lucilla Lame, MD;  Location: [place];  Service: Endoscopy;  Laterality: N/A;   POLYPECTOMY  06/27/2015   Procedure: POLYPECTOMY;  Surgeon: Lucilla Lame, MD;  Location: [place];  Service: Endoscopy;;   TOTAL HIP ARTHROPLASTY Right 09/10/2019   Procedure: TOTAL HIP ARTHROPLASTY ANTERIOR APPROACH;  Surgeon: Lovell Sheehan, MD;  Location: [facility];  Service: Orthopedics;  Laterality: Right;    There were no vitals filed for this visit.   Subjective Assessment - 01/22/21 0953     Subjective Pt brought her planner and notebook    Currently in Pain? No/denies                   ADULT SLP TREATMENT - 01/22/21 0954       General Information   Behavior/Cognition Alert;Cooperative;Pleasant mood    HPI Patient is a 66 y.o. female who admitted 08/10/20 and found to have Covid-19 as well as left ischemic  thalamic/internal capsule infarct. Acute SLP screened and s/o; pt now with residual cognitive fog, imbalance, fatigue, right first + second digit numbness.      Treatment Provided   Treatment provided Cognitive-Linquistic      Cognitive-Linquistic Treatment   Treatment focused on Cognition;Patient/family/caregiver education    Skilled Treatment Pt got out her planner, recalled events of previous 2 days without cues using her planner, as well as upcoming events. Completed her to do list items and added new items to her grocery list at home. Reported adding new appointments to her book. Pt with much less hesitation when discussing her schedule/plans today. Agrees her planner has been helpful with this. Discussed areas that have changed since her stroke, including forgetting items at home or at her mother's, feeling overwhelmed in larger gatherings or noisy environments, driving at night or in poor weather. Discussed strategies and ways pt can manage her energy and self-advocate for needs with friends/family. Pt read her book for 10 minutes and was able to discuss with occasional referencing/re-reading. Wrote brief journal entry summarizing her thoughts.      Assessment / Recommendations / Plan   Plan Continue with current plan of care      Progression Toward Goals   Progression toward goals  Progressing toward goals              SLP Education - 01/22/21 0958     Education Details gradually increase attention with reading/journal activity    Person(s) Educated Patient    Methods Explanation    Comprehension Verbalized understanding              SLP Short Term Goals - 12/24/20 1555       SLP SHORT TERM GOAL #1   Title Pt will verbalize and demonstrate how to implement 2 memory and attention strategies to aid daily functioning given occasional min A over 2 sessions    Time 10    Period --   sessions   Status New      SLP SHORT TERM GOAL #2   Title Pt will manage finances with  use of compensations with no reported episodes of missed bills given rare min A    Time 10    Period --   sessions   Status New      SLP SHORT TERM GOAL #3   Title Pt will identify errors in writing and self-correct with 80% accuracy given rare min A over 2 sessions    Time 10    Period --   sessions   Status New      SLP SHORT TERM GOAL #4   Title Pt will participate in further language assessment as needed (goals to be added)    Time 10    Period --   sessions   Status New              SLP Long Term Goals - 12/24/20 1559       SLP LONG TERM GOAL #1   Title Pt will verbalize and implement 4 attention and memory strategies to aid daily functioning for ADLs and IADLs with rare min A over 2 sessions    Time 12    Period Weeks    Status New    Target Date 03/24/20      SLP LONG TERM GOAL #2   Title Pt will identify and correct paraphasias in conversation and writing with 90% accuracy given rare min A over 2 sessions    Time 12    Period Weeks    Status New    Target Date 03/24/20      SLP LONG TERM GOAL #3   Title Pt will use compensations for anomia, thought organization effectively in 20 minutes mod complex conversation.    Time 12    Period Weeks    Status New    Target Date 03/24/20              Plan - 01/22/21 1001     Clinical Impression Statement Patient presents with mild cognitive communication impairment. Patient is using compensations for recall, however requires cues ongoing training to implement effectively. Improved carryover with planner today. I recommend skilled ST to maximize pt's cognitive communication and language skills to improve accuracy/independence with ADLs and IADLs, reduce communication breakdowns, and improve quality of life.    Speech Therapy Frequency 2x / week    Duration 12 weeks    Treatment/Interventions Language facilitation;Environmental controls;SLP instruction and feedback;Cognitive reorganization;Compensatory  strategies;Patient/family education;Internal/external aids    Potential to Achieve Goals Good    SLP Home Exercise Plan see pt instructions    Consulted and Agree with Plan of Care Patient             Patient will benefit  from skilled therapeutic intervention in order to improve the following deficits and impairments:   Cognitive communication deficit    Problem List Patient Active Problem List   Diagnosis Date Noted   Acute stroke due to ischemia ([place]) 08/10/2020   History of total hip replacement, right 09/10/2019   Special screening for malignant neoplasms, colon    Benign neoplasm of ascending colon    Benign neoplasm of descending colon    Deneise Lever, MS, CCC-SLP Speech-Language Pathologist  Aliene Altes, [place] 01/22/2021, 10:02 AM  [place], [name], [address] Phone: [phone]   Fax:  [phone]   Name: Kim Gomez MRN: [id number] Date of Birth: 1955-02-02

## 2021-01-22 NOTE — Patient Instructions (Signed)
You did great using your planner this week! Keep it up! Remember to write things down the moment you think about them.  Suggestions:  If you find yourself forgetting items when you leave the house (or your mom's house), try hanging your bag on the door handle or putting a basket by the door where you put all items when you come in, or pack them up as you get ready to leave. You can even make yourself a checklist (a sticky note on your door or on your car's steering wheel, or a list in your phone) that you check before you leave to make sure you have everything with you.  It might help you to share your information about "pennies" with your friends and family. Just like before your stroke, when didn't think twice about driving in the dark or going to noisy places with lots of people, your friends and family might not realize how overwhelming these things can be for you. Let them know that spending time with them is important to you, and you want to save your "pennies" to do things with them. See if they can help you out by offering to drive you, choosing a quieter place to socialize, or even bringing takeout to have with you in a place you feel comfortable (your home, or a park nearby if it is nice outside).   If you are finding it hard to focus on reading, set a short timer for yourself (5-10 minutes). Choose a time of day when you still have some energy, and when you can limit the distractions around you. Write a few sentences about what you read when you are done. (What did you find interesting about it? What did it make you think of? Did you learn anything new or surprising?). If you can focus for 10 minutes, try that each day for a week. The next week, increase your time by a few minutes. You can GRADUALLY work up to focusing for longer periods of time.

## 2021-01-22 NOTE — Patient Instructions (Signed)
Access Code: [email or web address] URL: [email or web address] Date: 01/22/2021 Prepared by: Rivka Barbara  Exercises Heel Toe Raises with Counter Support - 1 x daily - 7 x weekly - 1 sets - 20 reps Tandem Stance - 1 x daily - 7 x weekly - 2 sets - 45 seconds hold

## 2021-01-22 NOTE — Addendum Note (Signed)
Addended by: Rivka Barbara B on: 01/22/2021 08:01 AM   Modules accepted: Orders

## 2021-01-27 ENCOUNTER — Ambulatory Visit: Payer: No Typology Code available for payment source | Admitting: Physical Therapy

## 2021-01-27 ENCOUNTER — Ambulatory Visit: Payer: No Typology Code available for payment source | Admitting: Speech Pathology

## 2021-01-27 ENCOUNTER — Other Ambulatory Visit: Payer: Self-pay

## 2021-01-27 DIAGNOSIS — R269 Unspecified abnormalities of gait and mobility: Secondary | ICD-10-CM

## 2021-01-27 DIAGNOSIS — R41841 Cognitive communication deficit: Secondary | ICD-10-CM

## 2021-01-27 DIAGNOSIS — R2681 Unsteadiness on feet: Secondary | ICD-10-CM

## 2021-01-27 NOTE — Therapy (Signed)
[place] MAIN [facility] SERVICES [address] [place], [name], [address] Phone: [phone]   Fax:  [phone]  Physical Therapy Treatment  Patient Details  Name: Kim Gomez MRN: [id number] Date of Birth: 1955-07-11 Referring Provider (PT): Jennings Books MD   Encounter Date: 01/27/2021   PT End of Session - 01/27/21 0813     Visit Number 3    Number of Visits 24    Date for PT Re-Evaluation 04/14/21    Progress Note Due on Visit 10    PT Start Time 0806    PT Stop Time 0844    PT Time Calculation (min) 38 min    Equipment Utilized During Treatment Gait belt    Activity Tolerance Patient tolerated treatment well;No increased pain    Behavior During Therapy WFL for tasks assessed/performed             Past Medical History:  Diagnosis Date   Anxiety    Arthritis    legs, hands   Cellulitis of right knee    started on antibiotics 06/21/15.     Depression    Family history of adverse reaction to anesthesia    sister and mom -PONV   Hypercholesteremia    Hypertension     Past Surgical History:  Procedure Laterality Date   ABDOMINAL HYSTERECTOMY     COLONOSCOPY WITH PROPOFOL N/A 06/27/2015   Procedure: COLONOSCOPY WITH PROPOFOL;  Surgeon: Lucilla Lame, MD;  Location: [place];  Service: Endoscopy;  Laterality: N/A;   POLYPECTOMY  06/27/2015   Procedure: POLYPECTOMY;  Surgeon: Lucilla Lame, MD;  Location: [place];  Service: Endoscopy;;   TOTAL HIP ARTHROPLASTY Right 09/10/2019   Procedure: TOTAL HIP ARTHROPLASTY ANTERIOR APPROACH;  Surgeon: Lovell Sheehan, MD;  Location: [facility];  Service: Orthopedics;  Laterality: Right;    There were no vitals filed for this visit.   Subjective Assessment - 01/27/21 0807     Subjective no falls, stumbles, LOB since previous appointrment. Pt reports he home exercises have been going well.    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 Pt reports a fall on october 29th. Pt rpeorts she hurt the R UE with the fall and she reports some stiffness in the knee as well as general weakness on the right side. Pt reports she has had 2 strokes but the first went under the radar and was only evidenced by imaging presented to her by her MD. Pt reports she has been modifying her activities in order to be careful not to fall since the stroke. Reports she has been taking her time with a lot of things. Pt reports she has a membership at [employer] and she has been walking on the treadmill 2-3 times per week, she reports she walks on the treadmill for an hour at a time following working her way up. Pt reports ocassional numbness and tingling in her hands.    Limitations House hold activities   cooking, vacuuming   How long can you walk comfortably? 1 hour with UE support    Patient Stated Goals improve strength and balance in the lower extremities.    Currently in Pain? No/denies                Exercise/Activity Sets/ Reps/Time/ Resistance Assistance Charge type Comments  NBOS - Airex  with head turns and nods  3 x 45 sec cga Neuro re-ed Increased difficulty with head nods vs head turns (cervical rotation)   Semitandem and tandem on airex with Dual task (see comments)  2 round , first semitandem, second tandem each with R LE posterior CGA Neuro re-ed Played word games on white board with challenge of reaching to limits of BOS, increased diffiulty with R LE posterior (involved side) and increased difficulty with reaching toward the left (left weight shifting) -task involved dual task motor and cognitive                Airex step up -  lateral  10 x ea  CGA Neuro re-ed Cues for large steps, improved efficacy with practice  STS - airex pad  2 x 10  cga Neuro re-ed  UE use on first several reps but with practice able to complete  without UE support   Heel raise on 1/2 FR 2 x 10  CGA Neuro  UE assist on first several reps followed by no UE assist -to retrain ability to recover balance with LOB on heels  -cues for proper positioning                           Treatment provided this session   Pt educated throughout session about proper posture and technique with exercises. Improved exercise technique, movement at target joints, use of target muscles after min to mod verbal, visual, tactile cues.  Note: Portions of this document were prepared using Dragon voice recognition software and although reviewed may contain unintentional dictation errors in syntax, grammar, or spelling.                            PT Short Term Goals - 01/20/21 0915       PT SHORT TERM GOAL #1   Title Patient will be independent in home exercise program to improve strength/mobility for better functional independence with ADLs.    Baseline no HEP at this time    Time 4    Period Weeks    Status New    Target Date 02/17/21               PT Long Term Goals - 01/20/21 0915       PT LONG TERM GOAL #1   Title Patient will increase FOTO score to equal to or greater than  50   to demonstrate statistically significant improvement in mobility and quality of life.    Baseline 41 on 01/20/21    Time 8    Period Weeks    Status New    Target Date 03/17/21      PT LONG TERM GOAL #2   Title Pt will improve mini BEST test by 4 points or more in order to indicate clinically significant improvent in balance.    Baseline see flowsheets    Time 12    Period Weeks    Status New    Target Date 04/14/21      PT LONG TERM GOAL #3   Title Patient  will complete five times sit to stand test in < 15 seconds indicating an increased LE strength and improved balance.    Baseline see flowsheets    Time 12    Period Weeks    Status New    Target Date 04/14/21      PT LONG TERM  GOAL #4   Title Patient will increase 10  meter walk test to >1.68m/s as to improve gait speed for better community ambulation and to reduce fall risk.    Baseline .86 m/s on 01/20/21    Time 8    Period Weeks    Status New    Target Date 03/17/21      PT LONG TERM GOAL #5   Title Pty will improve dual task TUG to less than 20 seconds in order to indicate improved cognitive and motor tasks.    Baseline 28.82 on 01/20/21    Time 12    Period Weeks    Status New    Target Date 04/14/21                   Plan - 01/27/21 1204     Clinical Impression Statement Pt presents with excellent motivation for completion of physical therapy program. Pt performed several balance and neuromuscular re-education activities in today's session and tolerated them well. Also integrated dual task interventions to increase difficulty as well as to assist pt in progressing. Will continue with these types of interventions in future sessions at increased difficulty as pt can tolerate. Pt will continue to benefit from skilled PT intervention to improve her balance, strength and reduce her risk of falls in addition to increased her confidence with everyday activities.    Personal Factors and Comorbidities Age;Comorbidity 1;Comorbidity 2    Comorbidities arthritis, anxiety, HTN, hyperlipedemia.    Examination-Activity Limitations Carry;Lift;Locomotion Level;Squat;Stairs    Examination-Participation Restrictions Cleaning;Yard Work;Meal Prep    Stability/Clinical Decision Making Stable/Uncomplicated    Rehab Potential Good    PT Frequency 2x / week    PT Duration 12 weeks    PT Treatment/Interventions ADLs/Self Care Home Management;Gait training;Therapeutic activities;Functional mobility training;Therapeutic exercise;Balance training;Neuromuscular re-education;Manual techniques;Energy conservation;Passive range of motion    PT Next Visit Plan Continue with activity progressions, integration of dual tasks    PT Home Exercise Plan Access Code: [email or web address]   URL: [email or web address]    Consulted and Agree with Plan of Care Patient             Patient will benefit from skilled therapeutic intervention in order to improve the following deficits and impairments:  Decreased activity tolerance, Decreased strength, Decreased mobility, Decreased balance  Visit Diagnosis: Abnormality of gait and mobility  Unsteadiness on feet     Problem List Patient Active Problem List   Diagnosis Date Noted   Acute stroke due to ischemia ([place]) 08/10/2020   History of total hip replacement, right 09/10/2019   Special screening for malignant neoplasms, colon    Benign neoplasm of ascending colon    Benign neoplasm of descending colon     Particia Lather, PT 01/27/2021, 12:09 PM  [place] [address] [place], [name], [address] Phone: [phone]   Fax:  [phone]  Name: LULUBELLE SIMCOE MRN: [id number] Date of Birth: 12/12/1955

## 2021-01-27 NOTE — Therapy (Signed)
[place] MAIN [facility] SERVICES [address] [place], [name], [address] Phone: [phone]   Fax:  [phone]  Speech Language Pathology Treatment  Patient Details  Name: CELINA SHILEY MRN: [id number] Date of Birth: 10/24/1955 Referring Provider (SLP): Dr. Jennings Books   Encounter Date: 01/27/2021   End of Session - 01/27/21 0946     Visit Number 8    Number of Visits 25    Date for SLP Re-Evaluation 03/24/21    SLP Start Time 0900    SLP Stop Time  1000    SLP Time Calculation (min) 60 min    Activity Tolerance Patient tolerated treatment well             Past Medical History:  Diagnosis Date   Anxiety    Arthritis    legs, hands   Cellulitis of right knee    started on antibiotics 06/21/15.     Depression    Family history of adverse reaction to anesthesia    sister and mom -PONV   Hypercholesteremia    Hypertension     Past Surgical History:  Procedure Laterality Date   ABDOMINAL HYSTERECTOMY     COLONOSCOPY WITH PROPOFOL N/A 06/27/2015   Procedure: COLONOSCOPY WITH PROPOFOL;  Surgeon: Lucilla Lame, MD;  Location: [place];  Service: Endoscopy;  Laterality: N/A;   POLYPECTOMY  06/27/2015   Procedure: POLYPECTOMY;  Surgeon: Lucilla Lame, MD;  Location: [place];  Service: Endoscopy;;   TOTAL HIP ARTHROPLASTY Right 09/10/2019   Procedure: TOTAL HIP ARTHROPLASTY ANTERIOR APPROACH;  Surgeon: Lovell Sheehan, MD;  Location: [facility];  Service: Orthopedics;  Laterality: Right;    There were no vitals filed for this visit.   Subjective Assessment - 01/27/21 0939     Subjective Forgot planner today    Currently in Pain? No/denies                   ADULT SLP TREATMENT - 01/27/21 0939       General Information   Behavior/Cognition Alert;Cooperative;Pleasant mood    HPI Patient is a 66 y.o. female who admitted 08/10/20 and found to have Covid-19 as well as left ischemic thalamic/internal capsule  infarct. Acute SLP screened and s/o; pt now with residual cognitive fog, imbalance, fatigue, right first + second digit numbness.      Cognitive-Linquistic Treatment   Treatment focused on Cognition;Patient/family/caregiver education    Skilled Treatment Patient reported forgetting her planner; acknowledged she should have put it by the door. Facilitated functional recall re: MD appointment with usual min question cues required. Pt able to summarize MD recommendations with prompt to topic. Pt reported feeling overwhelmed /anxious about doctor visits. With min-mod cues, pt able to identify a strategy to use to preplan and recall information from appointments (writing in her notebook). Put this into practice in session today with min cues. Pt reported communication breakdowns with spouse when she is engaged in another task: "It's like I hear him but I don't hear him." Education on strategies for attention and communication effectiveness, such as asking her spouse to gain her attention first, using eye contact during conversations, and asking for a moment so that she can get to a stopping point in the task she is engaged in. Pt wrote suggestions in her notebook with min cues. Subsequently, pt initiated use of notebook for writing a reminder to herself. Targeted selective attention in functional task (pt reading her book with min noise/movement distraction);  pt remainded engaged for 10 minutes, taking notes intermittently to facilitate recall/attention. Summarized details of her reading with use of notes.      Assessment / Recommendations / Plan   Plan Continue with current plan of care      Progression Toward Goals   Progression toward goals Progressing toward goals              SLP Education - 01/27/21 0945     Education Details preplan for doctor's visits by writing questions; ask for time to write notes during visits if needed    Person(s) Educated Patient    Methods Explanation;Verbal cues     Comprehension Verbalized understanding;Returned demonstration;Need further instruction              SLP Short Term Goals - 12/24/20 1555       SLP SHORT TERM GOAL #1   Title Pt will verbalize and demonstrate how to implement 2 memory and attention strategies to aid daily functioning given occasional min A over 2 sessions    Time 10    Period --   sessions   Status New      SLP SHORT TERM GOAL #2   Title Pt will manage finances with use of compensations with no reported episodes of missed bills given rare min A    Time 10    Period --   sessions   Status New      SLP SHORT TERM GOAL #3   Title Pt will identify errors in writing and self-correct with 80% accuracy given rare min A over 2 sessions    Time 10    Period --   sessions   Status New      SLP SHORT TERM GOAL #4   Title Pt will participate in further language assessment as needed (goals to be added)    Time 10    Period --   sessions   Status New              SLP Long Term Goals - 12/24/20 1559       SLP LONG TERM GOAL #1   Title Pt will verbalize and implement 4 attention and memory strategies to aid daily functioning for ADLs and IADLs with rare min A over 2 sessions    Time 12    Period Weeks    Status New    Target Date 03/24/20      SLP LONG TERM GOAL #2   Title Pt will identify and correct paraphasias in conversation and writing with 90% accuracy given rare min A over 2 sessions    Time 12    Period Weeks    Status New    Target Date 03/24/20      SLP LONG TERM GOAL #3   Title Pt will use compensations for anomia, thought organization effectively in 20 minutes mod complex conversation.    Time 12    Period Weeks    Status New    Target Date 03/24/20              Plan - 01/27/21 0946     Clinical Impression Statement Patient presents with mild cognitive communication impairment. Patient is using compensations for recall, however requires cues ongoing training to implement  effectively. Forgot planner today, but is demonstrating carryover with use at home and reports this is helping her improve daily function. Also demonstrating improved awareness of managing tasks that are demanding for her cognitively and scheduling these differently  if necessary. I recommend skilled ST to maximize pt's cognitive communication and language skills to improve accuracy/independence with ADLs and IADLs, reduce communication breakdowns, and improve quality of life.    Speech Therapy Frequency 2x / week    Duration 12 weeks    Treatment/Interventions Language facilitation;Environmental controls;SLP instruction and feedback;Cognitive reorganization;Compensatory strategies;Patient/family education;Internal/external aids    Potential to Achieve Goals Good    SLP Home Exercise Plan see pt instructions    Consulted and Agree with Plan of Care Patient             Patient will benefit from skilled therapeutic intervention in order to improve the following deficits and impairments:   Cognitive communication deficit    Problem List Patient Active Problem List   Diagnosis Date Noted   Acute stroke due to ischemia ([place]) 08/10/2020   History of total hip replacement, right 09/10/2019   Special screening for malignant neoplasms, colon    Benign neoplasm of ascending colon    Benign neoplasm of descending colon    Deneise Lever, [place], CCC-SLP Speech-Language Pathologist  Aliene Altes, [place] 01/27/2021, 9:48 AM  [place] [address] [place], [name], [address] Phone: [phone]   Fax:  [phone]   Name: LESIELI BRESEE MRN: [id number] Date of Birth: [birth year]

## 2021-01-29 ENCOUNTER — Ambulatory Visit: Payer: No Typology Code available for payment source | Admitting: Speech Pathology

## 2021-01-29 ENCOUNTER — Other Ambulatory Visit: Payer: Self-pay

## 2021-01-29 ENCOUNTER — Ambulatory Visit: Payer: No Typology Code available for payment source | Admitting: Physical Therapy

## 2021-01-29 ENCOUNTER — Ambulatory Visit: Payer: No Typology Code available for payment source | Admitting: Occupational Therapy

## 2021-01-29 ENCOUNTER — Encounter: Payer: Self-pay | Admitting: Occupational Therapy

## 2021-01-29 DIAGNOSIS — R4701 Aphasia: Secondary | ICD-10-CM

## 2021-01-29 DIAGNOSIS — R269 Unspecified abnormalities of gait and mobility: Secondary | ICD-10-CM | POA: Diagnosis not present

## 2021-01-29 DIAGNOSIS — R278 Other lack of coordination: Secondary | ICD-10-CM

## 2021-01-29 DIAGNOSIS — R2681 Unsteadiness on feet: Secondary | ICD-10-CM

## 2021-01-29 DIAGNOSIS — R41841 Cognitive communication deficit: Secondary | ICD-10-CM

## 2021-01-29 DIAGNOSIS — M6281 Muscle weakness (generalized): Secondary | ICD-10-CM

## 2021-01-29 NOTE — Therapy (Signed)
[place] MAIN [facility] SERVICES [address] [place], [name], [address] Phone: [phone]   Fax:  [phone]  Speech Language Pathology Treatment  Patient Details  Name: Kim Gomez MRN: [id number] Date of Birth: Jan 29, 1955 Referring Provider (SLP): Dr. Jennings Books   Encounter Date: 01/29/2021   End of Session - 01/29/21 1014     Visit Number 9    Number of Visits 25    Date for SLP Re-Evaluation 03/24/21    SLP Start Time 0900    SLP Stop Time  1000    SLP Time Calculation (min) 60 min    Activity Tolerance Patient tolerated treatment well             Past Medical History:  Diagnosis Date   Anxiety    Arthritis    legs, hands   Cellulitis of right knee    started on antibiotics 06/21/15.     Depression    Family history of adverse reaction to anesthesia    sister and mom -PONV   Hypercholesteremia    Hypertension     Past Surgical History:  Procedure Laterality Date   ABDOMINAL HYSTERECTOMY     COLONOSCOPY WITH PROPOFOL N/A 06/27/2015   Procedure: COLONOSCOPY WITH PROPOFOL;  Surgeon: Lucilla Lame, MD;  Location: [place];  Service: Endoscopy;  Laterality: N/A;   POLYPECTOMY  06/27/2015   Procedure: POLYPECTOMY;  Surgeon: Lucilla Lame, MD;  Location: [place];  Service: Endoscopy;;   TOTAL HIP ARTHROPLASTY Right 09/10/2019   Procedure: TOTAL HIP ARTHROPLASTY ANTERIOR APPROACH;  Surgeon: Lovell Sheehan, MD;  Location: [facility];  Service: Orthopedics;  Laterality: Right;    There were no vitals filed for this visit.   Subjective Assessment - 01/29/21 0900     Subjective "I did the night before thing."    Currently in Pain? No/denies                   ADULT SLP TREATMENT - 01/29/21 1008       General Information   Behavior/Cognition Alert;Cooperative;Pleasant mood    HPI Patient is a 66 y.o. female who admitted 08/10/20 and found to have Covid-19 as well as left ischemic  thalamic/internal capsule infarct. Acute SLP screened and s/o; pt now with residual cognitive fog, imbalance, fatigue, right first + second digit numbness.      Cognitive-Linquistic Treatment   Treatment focused on Cognition;Patient/family/caregiver education    Skilled Treatment Patient reports using strategy with her husband, asking him to talk with her face to face (waiting until she got off of the computer). Continues to use planner daily with success; pt made notes on her to-do list as she thought of something she wanted to try during the conversation; also asked for a moment to write it down. Reports burning 2 different dishes while trying to cook and watch virtual church service on Sunday. Educated pt on use of timers, limiting multitasking, and using checklists to preplan steps of preparing a meal. Pt agreed she should stay in the kitchen when cooking. Pt expressed her concern about her ability to focus and pay attention to details if she were to return to work. She is strongly considering retiring, but would also like to consider working on a part time basis. Provided information re: vocational rehabilitation services. Reviewed pt's homework (she read and took notes on her book); pt engaged in 10 minute conversation regarding what she read. Utilized conversation pre-planning by having pt generate  a list of topics she would like to discuss at her upcoming OT evaluation.      Assessment / Recommendations / Plan   Plan Continue with current plan of care      Progression Toward Goals   Progression toward goals Progressing toward goals              SLP Education - 01/29/21 1013     Education Details vocational rehabilitation, use of timers and checklists, preplanning    Person(s) Educated Patient    Methods Explanation;Verbal cues    Comprehension Verbalized understanding;Returned demonstration;Verbal cues required;Need further instruction              SLP Short Term Goals -  12/24/20 1555       SLP SHORT TERM GOAL #1   Title Pt will verbalize and demonstrate how to implement 2 memory and attention strategies to aid daily functioning given occasional min A over 2 sessions    Time 10    Period --   sessions   Status New      SLP SHORT TERM GOAL #2   Title Pt will manage finances with use of compensations with no reported episodes of missed bills given rare min A    Time 10    Period --   sessions   Status New      SLP SHORT TERM GOAL #3   Title Pt will identify errors in writing and self-correct with 80% accuracy given rare min A over 2 sessions    Time 10    Period --   sessions   Status New      SLP SHORT TERM GOAL #4   Title Pt will participate in further language assessment as needed (goals to be added)    Time 10    Period --   sessions   Status New              SLP Long Term Goals - 12/24/20 1559       SLP LONG TERM GOAL #1   Title Pt will verbalize and implement 4 attention and memory strategies to aid daily functioning for ADLs and IADLs with rare min A over 2 sessions    Time 12    Period Weeks    Status New    Target Date 03/24/20      SLP LONG TERM GOAL #2   Title Pt will identify and correct paraphasias in conversation and writing with 90% accuracy given rare min A over 2 sessions    Time 12    Period Weeks    Status New    Target Date 03/24/20      SLP LONG TERM GOAL #3   Title Pt will use compensations for anomia, thought organization effectively in 20 minutes mod complex conversation.    Time 12    Period Weeks    Status New    Target Date 03/24/20              Plan - 01/29/21 1014     Clinical Impression Statement Patient presents with mild cognitive communication impairment. Patient improving carryover with use of planner for daily tasks. Continues to report functional deficits related to attention and thought organization. Continue to work with pt to generate strategies to improve function with home tasks  and in conversations. Pt is demonstrating improved awareness of managing tasks that are demanding for her cognitively and scheduling these differently if necessary. I recommend skilled ST to maximize pt's  cognitive communication and language skills to improve accuracy/independence with ADLs and IADLs, reduce communication breakdowns, and improve quality of life.    Speech Therapy Frequency 2x / week    Duration 12 weeks    Treatment/Interventions Language facilitation;Environmental controls;SLP instruction and feedback;Cognitive reorganization;Compensatory strategies;Patient/family education;Internal/external aids    Potential to Achieve Goals Good    SLP Home Exercise Plan see pt instructions    Consulted and Agree with Plan of Care Patient             Patient will benefit from skilled therapeutic intervention in order to improve the following deficits and impairments:   Cognitive communication deficit  Aphasia    Problem List Patient Active Problem List   Diagnosis Date Noted   Acute stroke due to ischemia ([place]) 08/10/2020   History of total hip replacement, right 09/10/2019   Special screening for malignant neoplasms, colon    Benign neoplasm of ascending colon    Benign neoplasm of descending colon    Deneise Lever, [place], CCC-SLP Speech-Language Pathologist  Aliene Altes, [place] 01/29/2021, 10:16 AM  [facility] [address] [place], [name], [address] Phone: [phone]   Fax:  [phone]   Name: Kim Gomez MRN: [id number] Date of Birth: 08/01/1955

## 2021-01-29 NOTE — Patient Instructions (Signed)
Vocational Rehabilitation [email or web address] What We Do The Division of Vocational Rehabilitation Services [facility]) helps people with disabilities achieve their goals for employment and independence. If you have a disability that prevents you from achieving career success or independence in the community of your choice, [employer] can connect you to services and resources to help you meet your goals.  Jack [phone] [place]

## 2021-01-29 NOTE — Therapy (Signed)
[place] MAIN [facility] SERVICES [address] [place], [name], [address] Phone: [phone]   Fax:  [phone]  Physical Therapy Treatment  Patient Details  Name: Kim Gomez MRN: [id number] Date of Birth: [birth year] Referring Provider (PT): Jennings Books MD   Encounter Date: 01/29/2021   PT End of Session - 01/29/21 0806     Visit Number 4    Number of Visits 24    Date for PT Re-Evaluation 04/14/21    Progress Note Due on Visit 10    PT Start Time 0801    PT Stop Time 0102    PT Time Calculation (min) 43 min    Equipment Utilized During Treatment Gait belt    Activity Tolerance Patient tolerated treatment well;No increased pain    Behavior During Therapy WFL for tasks assessed/performed             Past Medical History:  Diagnosis Date   Anxiety    Arthritis    legs, hands   Cellulitis of right knee    started on antibiotics 06/21/15.     Depression    Family history of adverse reaction to anesthesia    sister and mom -PONV   Hypercholesteremia    Hypertension     Past Surgical History:  Procedure Laterality Date   ABDOMINAL HYSTERECTOMY     COLONOSCOPY WITH PROPOFOL N/A 06/27/2015   Procedure: COLONOSCOPY WITH PROPOFOL;  Surgeon: Lucilla Lame, MD;  Location: [place];  Service: Endoscopy;  Laterality: N/A;   POLYPECTOMY  06/27/2015   Procedure: POLYPECTOMY;  Surgeon: Lucilla Lame, MD;  Location: [place];  Service: Endoscopy;;   TOTAL HIP ARTHROPLASTY Right 09/10/2019   Procedure: TOTAL HIP ARTHROPLASTY ANTERIOR APPROACH;  Surgeon: Lovell Sheehan, MD;  Location: [facility];  Service: Orthopedics;  Laterality: Right;    There were no vitals filed for this visit.   Subjective Assessment - 01/29/21 0802     Subjective no falls, stumbles, LOB since previous appointrment. Pt reports HEp still going well, has no questions.               Exercise/Activity Sets/ Reps/Time/ Resistance Assistance  Charge type Comments  1 foot airex 1 foot step 1 x 45 sec ea  cga Neuro re-ed easy  Semitandem and tandem on airex with Dual task (see comments)  2 round, 1 with ea LE post/ant  CGA Neuro re-ed Played word games on white board with challenge of reaching to limits of BOS, increased diffiulty with R LE posterior (involved side) and increased difficulty with reaching toward the left (left weight shifting) -task involved dual task motor and cognitive    1 foot airex 1 foot on half foam roller anteriorly 1 x 45 sec ea  CGA Neuro  Rated medium difficulty, increased sway and ankle strategy utilization compared to 1 foot airex and 1 foot on step   LAQ  2 x 12 x 5 sec hold  Therex  Medium   Airex step up -  lateral  10 x ea  CGA Neuro re-ed Cues for large steps, improved efficacy with practice  STS - airex pad  2 x 10  cga Neuro re-ed  NO UE use this session, pt rated medium   Heel raise on 1/2 FR 2 x 12 x  5 second holds  CGA Neuro  UE assist ton all reps, unable to complete without UE assist this session.  -cues for proper positioning   Hamstring  curls  2x 12 RTB  Therex  Medium                     Treatment provided this session   Pt educated throughout session about proper posture and technique with exercises. Improved exercise technique, movement at target joints, use of target muscles after min to mod verbal, visual, tactile cues.  Note: Portions of this document were prepared using Dragon voice recognition software and although reviewed may contain unintentional dictation errors in syntax, grammar, or spelling.                             PT Short Term Goals - 01/20/21 0915       PT SHORT TERM GOAL #1   Title Patient will be independent in home exercise program to improve strength/mobility for better functional independence with ADLs.    Baseline no HEP at this time    Time 4    Period Weeks    Status New    Target Date 02/17/21               PT Long  Term Goals - 01/20/21 0915       PT LONG TERM GOAL #1   Title Patient will increase FOTO score to equal to or greater than  50   to demonstrate statistically significant improvement in mobility and quality of life.    Baseline 41 on 01/20/21    Time 8    Period Weeks    Status New    Target Date 03/17/21      PT LONG TERM GOAL #2   Title Pt will improve mini BEST test by 4 points or more in order to indicate clinically significant improvent in balance.    Baseline see flowsheets    Time 12    Period Weeks    Status New    Target Date 04/14/21      PT LONG TERM GOAL #3   Title Patient  will complete five times sit to stand test in < 15 seconds indicating an increased LE strength and improved balance.    Baseline see flowsheets    Time 12    Period Weeks    Status New    Target Date 04/14/21      PT LONG TERM GOAL #4   Title Patient will increase 10 meter walk test to >1.13m/s as to improve gait speed for better community ambulation and to reduce fall risk.    Baseline .86 m/s on 01/20/21    Time 8    Period Weeks    Status New    Target Date 03/17/21      PT LONG TERM GOAL #5   Title Pty will improve dual task TUG to less than 20 seconds in order to indicate improved cognitive and motor tasks.    Baseline 28.82 on 01/20/21    Time 12    Period Weeks    Status New    Target Date 04/14/21                   Plan - 01/29/21 0806     Clinical Impression Statement Pt presents with excellent motivation for completion of physical therapy program. Pt performed several balance and neuromuscular re-education activities in today's session and tolerated them well. Pt progressed with STS with unstable surface and did not require UE assist this session. Also integrated dual task interventions  to increase difficulty as well as to assist pt in progressing. Will continue with these types of interventions in future sessions at increased difficulty as pt can tolerate. Pt will continue to  benefit from skilled PT intervention to improve her balance, strength and reduce her risk of falls in addition to increased her confidence with everyday activities.    Personal Factors and Comorbidities Age;Comorbidity 1;Comorbidity 2    Comorbidities arthritis, anxiety, HTN, hyperlipedemia.    Examination-Activity Limitations Carry;Lift;Locomotion Level;Squat;Stairs    Examination-Participation Restrictions Cleaning;Yard Work;Meal Prep    Stability/Clinical Decision Making Stable/Uncomplicated    Rehab Potential Good    PT Frequency 2x / week    PT Duration 12 weeks    PT Treatment/Interventions ADLs/Self Care Home Management;Gait training;Therapeutic activities;Functional mobility training;Therapeutic exercise;Balance training;Neuromuscular re-education;Manual techniques;Energy conservation;Passive range of motion    PT Next Visit Plan Continue with activity progressions, integration of dual tasks    PT Home Exercise Plan Access Code: [email or web address]  URL: [email or web address]    Consulted and Agree with Plan of Care Patient             Patient will benefit from skilled therapeutic intervention in order to improve the following deficits and impairments:  Decreased activity tolerance, Decreased strength, Decreased mobility, Decreased balance  Visit Diagnosis: No diagnosis found.     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 [place], [name], [address] Phone: [phone]   Fax:  [phone]  Name: Kim Gomez MRN: [id number] Date of Birth: [birth year]

## 2021-01-31 NOTE — Therapy (Signed)
[place] MAIN [facility] SERVICES [address] [place], [name], [address] Phone: [phone]   Fax:  [phone]  Occupational Therapy Evaluation  Patient Details  Name: Kim Gomez MRN: [id number] Date of Birth: Nov 05, 1955 Referring Provider (OT): Joselyn Arrow   Encounter Date: 01/29/2021   OT End of Session - 01/31/21 1635     Visit Number 1    Number of Visits 24    Date for OT Re-Evaluation 04/25/21    OT Start Time 1000    OT Stop Time 1100    OT Time Calculation (min) 60 min    Activity Tolerance Patient tolerated treatment well    Behavior During Therapy [facility] for tasks assessed/performed             Past Medical History:  Diagnosis Date   Anxiety    Arthritis    legs, hands   Cellulitis of right knee    started on antibiotics 06/21/15.     Depression    Family history of adverse reaction to anesthesia    sister and mom -PONV   Hypercholesteremia    Hypertension     Past Surgical History:  Procedure Laterality Date   ABDOMINAL HYSTERECTOMY     COLONOSCOPY WITH PROPOFOL N/A 06/27/2015   Procedure: COLONOSCOPY WITH PROPOFOL;  Surgeon: Lucilla Lame, MD;  Location: [place];  Service: Endoscopy;  Laterality: N/A;   POLYPECTOMY  06/27/2015   Procedure: POLYPECTOMY;  Surgeon: Lucilla Lame, MD;  Location: [place];  Service: Endoscopy;;   TOTAL HIP ARTHROPLASTY Right 09/10/2019   Procedure: TOTAL HIP ARTHROPLASTY ANTERIOR APPROACH;  Surgeon: Lovell Sheehan, MD;  Location: [facility];  Service: Orthopedics;  Laterality: Right;    There were no vitals filed for this visit.      [facility] OT Assessment - 01/31/21 1634       Assessment   Medical Diagnosis CVA    Referring Provider (OT) Manuella Ghazi, H    Onset Date/Surgical Date 08/10/20    Hand Dominance Right      Balance Screen   Has the patient fallen in the past 6 months Yes    How many times? 1    Has the patient had a decrease in activity level because of a  fear of falling?  Yes    Is the patient reluctant to leave their home because of a fear of falling?  Yes      Home  Environment   Family/patient expects to be discharged to: Private residence    Living Arrangements Spouse/significant other    Available Help at Discharge Family    Type of [place] One level    Bathroom Shower/Tub Tub/Shower unit;Curtain    Research scientist (medical) - 2 wheels;Cane - single point;Bedside commode    Lives With Spouse      Prior Function   Level of Independence Independent    Vocation Full time employment    Leisure walking, reading, helps to take care of her mom      ADL   Eating/Feeding Modified independent   occasionally drops utensils or items   Grooming Minimal assistance   able to brush teeth, difficulty with brushing hair due to limited reach.   Upper Body Bathing Modified independent    Lower Body Bathing Increased time    Upper Body Dressing Increased time;Needs assist for fasteners  Lower Body Dressing Increased time;Minimal assistance   difficulty with socks, tying shoes, managing pants   Toilet Transfer Modified independent    Toileting - Clothing Manipulation Increased time    Tub/Shower Transfer Modified independent    ADL comments Pt reports difficulty with dressing, her right arm feels stiff and sore, she notes decreased sensation in thumb and index and right side of face currently. She reports difficulty with any tasks which requires reaching with use of right dominant arm particularly with drying off after a shower.  She reports she had an incident recently at home burning items on the stove. She reports she placed items on stove and got distracted and walked away and forgot she had food on the stove.She reports she went back to work and having difficulties, and mistakes and had to be taken out of work.  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  She reports when she went back to work she was no longer able to find the defects on the items she was inspecting.  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  She is currently on ST disability.Since her fall she is now afraid of falling. Pt reports fatigue with shopping and IADL tasks.      IADL   Prior Level of Function Shopping independent    Shopping Shops independently for small purchases    Prior Level of Function Light Housekeeping independent    Light Housekeeping Performs light daily tasks such as dishwashing, bed making    Prior Level of Function Meal Prep independent    Meal Prep Able to complete simple warm meal prep    Prior Level of Function Passenger transport manager own vehicle    Prior Level of Function Medication Managment independent    Medication Management Has difficulty remembering to take medication    Prior Level of Function Financial Management independent    Financial Management Requires supervision/minimal cuing      Mobility   Mobility Status Independent      Written Expression   Dominant Hand Right      Vision - History   Additional Comments Pt reports difficulty with right sided vision with a cataract and has an appt in February.  Pt wears glasses for distance, some decreased acuity on right and has to look closer at monitor to attend to details.      Cognition   Overall Cognitive Status Impaired/Different from baseline    Attention Selective    Selective Attention Impaired    Memory Impaired    Memory Impairment Decreased short term memory;Prospective memory    Decreased Short Term Memory Functional basic    Awareness Appears intact    Problem Solving Impaired    Problem Solving Impairment Verbal complex;Functional complex    Civil engineer, contracting complex    Cognition Comments She reports she has difficulty with memory, both long term and short term and often feels she has brain fog. Expressive speech is slow for her at times. Pt requires increased time for problem solving      Observation/Other Assessments   Focus on Therapeutic Outcomes (FOTO)  65  Sensation   Light Touch Appears Intact    Hot/Cold Appears Intact    Additional Comments Pt reports decreased taste and smell since COVID,  Decreased sensation on thumb, index and right side of the face.      Coordination   Finger Nose Finger Test slow to complete    9 Hole Peg Test Right;Left    Right 9 Hole Peg Test 27 sec    Left 9 Hole Peg Test 24 secs      AROM   Overall AROM Comments Right shoulder flexion to 93 degrees with pain with return from flexion, ABD 74 degrees, elbow flexion to 143, extension to -8 degrees.  Wrist flex to 60, ext to 45, supination to 80.  LUE within normal limits, shoulder flexion to 145, ABD 160. Pt able to demonstrate a full composite fist bilaterally and full oppsition of thumb to digits.      Strength   Overall Strength Comments R shoulder strength NT due to rotator cuff tear, otherwise elbow, wrist and hand 4/5 overall      Hand Function   Right Hand Grip (lbs) 28    Right Hand Lateral Pinch 6 lbs    Right Hand 3 Point Pinch 5 lbs    Left Hand Grip (lbs) 40    Left Hand Lateral Pinch 12 lbs    Left 3 point pinch 12 lbs    Comment 2 point pinch right  4#, left 10#                              OT Education - 01/31/21 1634     Education Details plan of care, goals, role of OT    Person(s) Educated Patient    Methods Explanation    Comprehension Verbalized understanding                 OT Long Term Goals - 01/31/21 1707       OT LONG TERM GOAL #1   Title Patient will be independent  with home exercise program    Baseline No current program    Time 12    Period Weeks    Status New    Target Date 04/25/21      OT LONG TERM GOAL #2   Title Patient will complete basic self-care tasks with modified independence and without increased time to complete tasks    Baseline Patient currently has difficulty with select self-care tasks and requires increased time to complete    Time 6    Period Weeks    Status New    Target Date 03/14/21      OT LONG TERM GOAL #3   Title Patient will demonstrate good safety awareness in the kitchen with cooking tasks with supervision    Baseline Decreased safety awareness at evaluation    Time 12    Period Weeks    Status New    Target Date 04/25/21      OT LONG TERM GOAL #4   Title Patient will increase right grip strength by 10 pounds to be able to open jars and containers with modified independence    Baseline 28 pounds at eval and difficulty with managing jars and containers    Time 12    Period Weeks    Status New    Target Date 04/25/21      OT LONG TERM GOAL #5   Title Patient will improve right  pinch strength by 5 pounds to pick up and manipulate items with modified independence    Baseline Right lateral 5 pounds, 3 point 4#    Time 12    Period Weeks    Status New    Target Date 04/25/21      Long Term Additional Goals   Additional Long Term Goals Yes      OT LONG TERM GOAL #6   Title Patient will decrease right shoulder pain to 2 out of 10 or less with movement patterns for daily activities.    Baseline Increased pain in right shoulder, 5 out of 10 with movement.    Time 12    Period Weeks    Status New    Target Date 04/25/21      OT LONG TERM GOAL #7   Title Patient will demonstrate score of 59 or greater on FOTO to show a clinically relevant change to impact self-care activities with greater independence    Baseline Score of 55 at eval    Time 12    Period Weeks    Status New    Target Date 04/25/21       OT LONG TERM GOAL #8   Title Will assess for potential return to work tasks as patient progresses with therapy    Baseline Patient currently unable to perform her job as outlined in her job description    Time 12    Period Weeks    Status New    Target Date 04/25/21                   Plan - 01/31/21 1635     Clinical Impression Statement Patient is a 66 year old female referred to occupational therapy after she suffered a stroke in July 2022.  Patient also fell resulting in a rotator cuff tear on the right.  Patient presents with muscle weakness, decreased coordination, decreased memory, decreased safety awareness, decreased sensation, decreased ROM, decreased cognition with problem-solving and divided attention, decreased ability to perform basic ADL and IADL tasks.  She would benefit from skilled occupational therapy intervention to maximize independence and safety with daily activities at home, work, and in the community.    OT Occupational Profile and History Problem Focused Assessment - Including review of records relating to presenting problem    Occupational performance deficits (Please refer to evaluation for details): ADL's;IADL's;Social Participation    Body Structure / Function / Physical Skills ADL;Dexterity;ROM;Strength;Coordination;FMC;IADL;Pain;Sensation;UE functional use;Decreased knowledge of use of DME    Cognitive Skills Memory;Safety Awareness    Psychosocial Skills Environmental  Adaptations;Habits;Routines and Behaviors    Rehab Potential Good    Clinical Decision Making Several treatment options, min-mod task modification necessary    Comorbidities Affecting Occupational Performance: May have comorbidities impacting occupational performance    Modification or Assistance to Complete Evaluation  No modification of tasks or assist necessary to complete eval    OT Frequency 2x / week    OT Duration 12 weeks    OT Treatment/Interventions Self-care/ADL  training;Cryotherapy;Paraffin;Therapeutic exercise;DME and/or AE instruction;Cognitive remediation/compensation;Neuromuscular education;Manual Therapy;Moist Heat;Contrast Bath;Therapeutic activities;Patient/family education    Consulted and Agree with Plan of Care Patient             Patient will benefit from skilled therapeutic intervention in order to improve the following deficits and impairments:   Body Structure / Function / Physical Skills: ADL, Dexterity, ROM, Strength, Coordination, FMC, IADL, Pain, Sensation, UE functional use, Decreased knowledge of use of DME Cognitive Skills:  Memory, Safety Awareness Psychosocial Skills: Environmental  Adaptations, Habits, Routines and Behaviors   Visit Diagnosis: Muscle weakness (generalized)  Other lack of coordination  Cognitive communication deficit    Problem List Patient Active Problem List   Diagnosis Date Noted   Acute stroke due to ischemia ([place]) 08/10/2020   History of total hip replacement, right 09/10/2019   Special screening for malignant neoplasms, colon    Benign neoplasm of ascending colon    Benign neoplasm of descending colon    Telena Peyser T Tomasita Morrow, OTR/L, CLT  Elodie Panameno, OT 01/31/2021, 5:23 PM  [place] [address] [place], [name], [address] Phone: [phone]   Fax:  [phone]  Name: SUHAILAH KWAN MRN: [id number] Date of Birth: Nov 23, 1955

## 2021-02-03 ENCOUNTER — Ambulatory Visit: Payer: No Typology Code available for payment source | Admitting: Physical Therapy

## 2021-02-03 ENCOUNTER — Ambulatory Visit: Payer: No Typology Code available for payment source | Admitting: Speech Pathology

## 2021-02-05 ENCOUNTER — Ambulatory Visit: Payer: No Typology Code available for payment source | Admitting: Speech Pathology

## 2021-02-05 ENCOUNTER — Ambulatory Visit: Payer: No Typology Code available for payment source | Admitting: Occupational Therapy

## 2021-02-05 ENCOUNTER — Ambulatory Visit: Payer: No Typology Code available for payment source | Admitting: Physical Therapy

## 2021-02-10 ENCOUNTER — Ambulatory Visit: Payer: No Typology Code available for payment source | Admitting: Physical Therapy

## 2021-02-10 ENCOUNTER — Ambulatory Visit: Payer: No Typology Code available for payment source | Admitting: Speech Pathology

## 2021-02-10 ENCOUNTER — Ambulatory Visit: Payer: No Typology Code available for payment source

## 2021-02-10 ENCOUNTER — Encounter: Payer: No Typology Code available for payment source | Admitting: Occupational Therapy

## 2021-02-12 ENCOUNTER — Ambulatory Visit: Payer: No Typology Code available for payment source | Admitting: Occupational Therapy

## 2021-02-12 ENCOUNTER — Ambulatory Visit: Payer: No Typology Code available for payment source | Admitting: Speech Pathology

## 2021-02-12 ENCOUNTER — Ambulatory Visit: Payer: No Typology Code available for payment source | Admitting: Physical Therapy

## 2021-02-17 ENCOUNTER — Ambulatory Visit: Payer: No Typology Code available for payment source | Admitting: Physical Therapy

## 2021-02-17 ENCOUNTER — Ambulatory Visit: Payer: No Typology Code available for payment source | Admitting: Speech Pathology

## 2021-02-19 ENCOUNTER — Ambulatory Visit: Payer: No Typology Code available for payment source | Admitting: Occupational Therapy

## 2021-02-19 ENCOUNTER — Ambulatory Visit: Payer: No Typology Code available for payment source | Admitting: Physical Therapy

## 2021-02-19 ENCOUNTER — Ambulatory Visit: Payer: No Typology Code available for payment source | Admitting: Speech Pathology

## 2021-02-24 ENCOUNTER — Ambulatory Visit: Payer: No Typology Code available for payment source | Admitting: Occupational Therapy

## 2021-02-24 ENCOUNTER — Ambulatory Visit: Payer: No Typology Code available for payment source | Attending: Neurology | Admitting: Physical Therapy

## 2021-02-24 ENCOUNTER — Other Ambulatory Visit: Payer: Self-pay

## 2021-02-24 ENCOUNTER — Encounter: Payer: Self-pay | Admitting: Occupational Therapy

## 2021-02-24 ENCOUNTER — Ambulatory Visit: Payer: No Typology Code available for payment source | Admitting: Speech Pathology

## 2021-02-24 DIAGNOSIS — M25611 Stiffness of right shoulder, not elsewhere classified: Secondary | ICD-10-CM | POA: Insufficient documentation

## 2021-02-24 DIAGNOSIS — R278 Other lack of coordination: Secondary | ICD-10-CM

## 2021-02-24 DIAGNOSIS — R2681 Unsteadiness on feet: Secondary | ICD-10-CM | POA: Diagnosis present

## 2021-02-24 DIAGNOSIS — R41841 Cognitive communication deficit: Secondary | ICD-10-CM | POA: Diagnosis present

## 2021-02-24 DIAGNOSIS — R269 Unspecified abnormalities of gait and mobility: Secondary | ICD-10-CM | POA: Diagnosis present

## 2021-02-24 DIAGNOSIS — M6281 Muscle weakness (generalized): Secondary | ICD-10-CM | POA: Insufficient documentation

## 2021-02-24 NOTE — Therapy (Signed)
[place] MAIN [facility] SERVICES [address] [place], [name], [address] Phone: [phone]   Fax:  [phone]  Physical Therapy Treatment  Patient Details  Name: Kim Gomez MRN: [id number] Date of Birth: 1955-08-05 Referring Provider (PT): Jennings Books MD   Encounter Date: 02/24/2021   PT End of Session - 02/24/21 0903     Visit Number 5    Number of Visits 24    Date for PT Re-Evaluation 04/14/21    Progress Note Due on Visit 10    PT Start Time 0807    PT Stop Time 0845    PT Time Calculation (min) 38 min    Equipment Utilized During Treatment Gait belt    Activity Tolerance Patient tolerated treatment well;No increased pain    Behavior During Therapy WFL for tasks assessed/performed             Past Medical History:  Diagnosis Date   Anxiety    Arthritis    legs, hands   Cellulitis of right knee    started on antibiotics 06/21/15.     Depression    Family history of adverse reaction to anesthesia    sister and mom -PONV   Hypercholesteremia    Hypertension     Past Surgical History:  Procedure Laterality Date   ABDOMINAL HYSTERECTOMY     COLONOSCOPY WITH PROPOFOL N/A 06/27/2015   Procedure: COLONOSCOPY WITH PROPOFOL;  Surgeon: Lucilla Lame, MD;  Location: [place];  Service: Endoscopy;  Laterality: N/A;   POLYPECTOMY  06/27/2015   Procedure: POLYPECTOMY;  Surgeon: Lucilla Lame, MD;  Location: [place];  Service: Endoscopy;;   TOTAL HIP ARTHROPLASTY Right 09/10/2019   Procedure: TOTAL HIP ARTHROPLASTY ANTERIOR APPROACH;  Surgeon: Lovell Sheehan, MD;  Location: [facility];  Service: Orthopedics;  Laterality: Right;    There were no vitals filed for this visit.   Subjective Assessment - 02/24/21 0811     Subjective no falls, stumbles, LOB since previous appointrment.Pt has been sick for last several weeks but still attempted to continue with her HEP as she felt able.    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 Pt reports a fall on october 29th. Pt rpeorts she hurt the R UE with the fall and she reports some stiffness in the knee as well as general weakness on the right side. Pt reports she has had 2 strokes but the first went under the radar and was only evidenced by imaging presented to her by her MD. Pt reports she has been modifying her activities in order to be careful not to fall since the stroke. Reports she has been taking her time with a lot of things. Pt reports she has a membership at [employer] and she has been walking on the treadmill 2-3 times per week, she reports she walks on the treadmill for an hour at a time following working her way up. Pt reports ocassional numbness and tingling in her hands.    Limitations House hold activities    How long can you walk comfortably? 1 hour with UE support    Patient Stated Goals improve strength and balance in the lower extremities.             Exercise/Activity Sets/ Reps/Time/ Resistance Assistance Charge type Comments  1 foot airex 1 foot step with ball hand off  1 x 45 sec  ea  cga Neuro re-ed Moderate difficulty, increased difficulty with R LE posterior   Semitandem and tandem on airex with Dual task  2 round, 1 with ea LE post/ant  CGA Neuro re-ed Dual task motor with Velcro dart throwing, increased sway and difficulty balancing with R Le posterior         LAQ  2 x 10 x 5 sec hold 5#  Therex  Moderate difficulty, more difficulty on R LE         STS - airex pad  2 x 10  cga Neuro re-ed  NO UE use this session, pt rated medium   Heel raise on 1/2 FR 2 x 12 x  5 second holds  CGA Neuro  UE assist on all reps, unable to complete without UE assist this session.  -cues for proper positioning   Hamstring curls  1 x 12 GTB  Therex  Medium                     Treatment provided this session  Pt educated throughout session about  proper posture and technique with exercises. Improved exercise technique, movement at target joints, use of target muscles after min to mod verbal, visual, tactile cues.  Note: Portions of this document were prepared using Dragon voice recognition software and although reviewed may contain unintentional dictation errors in syntax, grammar, or spelling.                                 PT Short Term Goals - 01/20/21 0915       PT SHORT TERM GOAL #1   Title Patient will be independent in home exercise program to improve strength/mobility for better functional independence with ADLs.    Baseline no HEP at this time    Time 4    Period Weeks    Status New    Target Date 02/17/21               PT Long Term Goals - 01/20/21 0915       PT LONG TERM GOAL #1   Title Patient will increase FOTO score to equal to or greater than  50   to demonstrate statistically significant improvement in mobility and quality of life.    Baseline 41 on 01/20/21    Time 8    Period Weeks    Status New    Target Date 03/17/21      PT LONG TERM GOAL #2   Title Pt will improve mini BEST test by 4 points or more in order to indicate clinically significant improvent in balance.    Baseline see flowsheets    Time 12    Period Weeks    Status New    Target Date 04/14/21      PT LONG TERM GOAL #3   Title Patient  will complete five times sit to stand test in < 15 seconds indicating an increased LE strength and improved balance.    Baseline see flowsheets    Time 12    Period Weeks    Status New    Target Date 04/14/21      PT LONG TERM GOAL #4   Title Patient will increase 10 meter walk test to >1.67m/s as to improve gait speed for better community ambulation and to reduce fall risk.    Baseline .86 m/s on 01/20/21    Time 8  Period Weeks    Status New    Target Date 03/17/21      PT LONG TERM GOAL #5   Title Pty will improve dual task TUG to less than 20 seconds in  order to indicate improved cognitive and motor tasks.    Baseline 28.82 on 01/20/21    Time 12    Period Weeks    Status New    Target Date 04/14/21                   Plan - 02/24/21 0856     Clinical Impression Statement Pt presents with excellent motivation for completion of physical therapy program. Pt performed several balance and neuromuscular re-education activities in today's session and tolerated them well. Pt had increased fatigue this session secondary to recently recovering from sickness. Continued with dual task interventions this session. Pt will continue to benefit from skilled PT intervention to improve her balance, strength and reduce her risk of falls in addition to increased her confidence with everyday activities.    Personal Factors and Comorbidities Age;Comorbidity 1;Comorbidity 2    Comorbidities arthritis, anxiety, HTN, hyperlipedemia.    Examination-Activity Limitations Carry;Lift;Locomotion Level;Squat;Stairs    Examination-Participation Restrictions Cleaning;Yard Work;Meal Prep    Stability/Clinical Decision Making Stable/Uncomplicated    Rehab Potential Good    PT Frequency 2x / week    PT Duration 12 weeks    PT Treatment/Interventions ADLs/Self Care Home Management;Gait training;Therapeutic activities;Functional mobility training;Therapeutic exercise;Balance training;Neuromuscular re-education;Manual techniques;Energy conservation;Passive range of motion    PT Next Visit Plan Continue with activity progressions, integration of dual tasks    PT Home Exercise Plan Access Code: [email or web address]  URL: [email or web address]    Consulted and Agree with Plan of Care Patient             Patient will benefit from skilled therapeutic intervention in order to improve the following deficits and impairments:  Decreased activity tolerance, Decreased strength, Decreased mobility, Decreased balance  Visit Diagnosis: Muscle weakness  (generalized)  Abnormality of gait and mobility  Unsteadiness on feet     Problem List Patient Active Problem List   Diagnosis Date Noted   Acute stroke due to ischemia ([place]) 08/10/2020   History of total hip replacement, right 09/10/2019   Special screening for malignant neoplasms, colon    Benign neoplasm of ascending colon    Benign neoplasm of descending colon     Kim Gomez, PT 02/24/2021, [place], [name], [address] Phone: [phone]   Fax:  [phone]  Name: Kim Gomez MRN: [id number] Date of Birth: [birth year]

## 2021-02-24 NOTE — Therapy (Signed)
[place] MAIN [facility] SERVICES [address] [place], [name], [address] Phone: [phone]   Fax:  [phone]  Speech Language Pathology Treatment  Speech Therapy Progress Note   Dates of reporting period  12/23/2020   to   02/24/2021  Objective: Patient has been seen for 10 speech therapy visits this reporting period targeting cognitive linguistic communication specific to attention, memory, and executive function for improved independence in home and work environments. Patient is making progress toward LTGs and partially met 2/4 STGs. See skilled intervention, clinical impressions and goals below for details.    Patient Details  Name: Kim Gomez MRN: [id number] Date of Birth: 22-Jan-1955 Referring Provider (SLP): Dr. Jennings Books   Encounter Date: 02/24/2021   End of Session - 02/24/21 1117     Visit Number 10    Number of Visits 25    Date for SLP Re-Evaluation 03/24/21    SLP Start Time 1000    SLP Stop Time  1100    SLP Time Calculation (min) 60 min    Activity Tolerance Patient tolerated treatment well             Past Medical History:  Diagnosis Date   Anxiety    Arthritis    legs, hands   Cellulitis of right knee    started on antibiotics 06/21/15.     Depression    Family history of adverse reaction to anesthesia    sister and mom -PONV   Hypercholesteremia    Hypertension     Past Surgical History:  Procedure Laterality Date   ABDOMINAL HYSTERECTOMY     COLONOSCOPY WITH PROPOFOL N/A 06/27/2015   Procedure: COLONOSCOPY WITH PROPOFOL;  Surgeon: Lucilla Lame, MD;  Location: [place];  Service: Endoscopy;  Laterality: N/A;   POLYPECTOMY  06/27/2015   Procedure: POLYPECTOMY;  Surgeon: Lucilla Lame, MD;  Location: [place];  Service: Endoscopy;;   TOTAL HIP ARTHROPLASTY Right 09/10/2019   Procedure: TOTAL HIP ARTHROPLASTY ANTERIOR APPROACH;  Surgeon: Lovell Sheehan, MD;  Location: [facility];   Service: Orthopedics;  Laterality: Right;    There were no vitals filed for this visit.   Subjective Assessment - 02/24/21 1104     Subjective "I'm feeling much better today"    Currently in Pain? No/denies    Pain Score 0-No pain                   ADULT SLP TREATMENT - 02/24/21 1105       General Information   Behavior/Cognition Alert;Cooperative;Pleasant mood    HPI Patient is a 66 y.o. female who admitted 08/10/20 and found to have Covid-19 as well as left ischemic thalamic/internal capsule infarct. Acute SLP screened and s/o; pt now with residual cognitive fog, imbalance, fatigue, right first + second digit numbness.      Cognitive-Linquistic Treatment   Treatment focused on Cognition;Patient/family/caregiver education    Skilled Treatment Facilitated review of home strategy usage. Pt reported challenges with alternating attention during cooking, though independently problem solved by modifying meal difficulty. She utilizes tablet for note taking and calendar for appointments. 17% accuracy x 6 for fully accurate appointment information (pt missing times). Trained alternating attention strategy with use of finger marker, after x5 demo, pt carried over strategy use indep entering therapy appointments in calendar. With min cues, pt took notes for recipe, noting ingredients and steps. Min assistance needed for attention to detail of recipe (missing a step). She needed mod  assistance for problem solving organization, and ease of reference via highlighting key words.      Assessment / Recommendations / Plan   Plan Continue with current plan of care      Progression Toward Goals   Progression toward goals Progressing toward goals              SLP Education - 02/24/21 1116     Education Details Strategy usage at home    Person(s) Educated Patient    Methods Explanation;Demonstration    Comprehension Verbalized understanding;Returned demonstration;Need further instruction               SLP Short Term Goals - 02/24/21 1129       SLP SHORT TERM GOAL #1   Title Pt will verbalize and demonstrate how to implement 2 memory and attention strategies to aid daily functioning given occasional min A over 2 sessions    Status Partially Met      SLP SHORT TERM GOAL #2   Title Pt will manage finances with use of compensations with no reported episodes of missed bills given rare min A    Status On-going      SLP SHORT TERM GOAL #3   Title Pt will identify errors in writing and self-correct with 80% accuracy given rare min A over 2 sessions    Status Partially Met      SLP SHORT TERM GOAL #4   Title Pt will participate in further language assessment as needed (goals to be added)    Status On-going              SLP Long Term Goals - 02/24/21 1130       SLP LONG TERM GOAL #1   Title Pt will verbalize and implement 4 attention and memory strategies to aid daily functioning for ADLs and IADLs with rare min A over 2 sessions    Status On-going      SLP LONG TERM GOAL #2   Title Pt will identify and correct paraphasias in conversation and writing with 90% accuracy given rare min A over 2 sessions    Status On-going      SLP LONG TERM GOAL #3   Title Pt will use compensations for thought organization effectively in 20 minutes mod complex conversation.    Status Revised              Plan - 02/24/21 1118     Clinical Impression Statement Patient presents with mild cognitive communication impairment. Patient improving carryover with use of planner for daily tasks. Continues to report functional deficits related to attention and thought organization. Pt is demonstrating improved awareness of managing tasks that are demanding for her cognitively and scheduling these differently if necessary. Patient demonstrated IND recall of memory strategies though continues to require assistance for attention strategies. Pateint IND identified writing errors in IADL  activity today: writing a recipe, suspect patient on target to meet goal for IND error identification across 2 sessions on next visit. Patient continues to benefit from SLP intervention for mod-complex attention, problem solving, and executive function as demonstrated with moderate assistance during tasks this date. I recommend continued  skilled ST to maximize pt's cognitive communication and language skills to improve accuracy/independence with ADLs and IADLs, reduce communication breakdowns, and improve quality of life.    Speech Therapy Frequency 2x / week    Duration 12 weeks    Treatment/Interventions Language facilitation;Environmental controls;SLP instruction and feedback;Cognitive reorganization;Compensatory strategies;Patient/family education;Internal/external aids  Potential to Achieve Goals Good    SLP Home Exercise Plan see pt instructions    Consulted and Agree with Plan of Care Patient             Patient will benefit from skilled therapeutic intervention in order to improve the following deficits and impairments:   Cognitive communication deficit    Problem List Patient Active Problem List   Diagnosis Date Noted   Acute stroke due to ischemia ([place]) 08/10/2020   History of total hip replacement, right 09/10/2019   Special screening for malignant neoplasms, colon    Benign neoplasm of ascending colon    Benign neoplasm of descending colon    [place] L. Christella App, M.A. [place] [phone]   [place], [place] 02/24/2021, 11:38 AM  [place] MAIN [facility] SERVICES [address] [place], [name], [address] Phone: [phone]   Fax:  [phone]   Name: Kim Gomez MRN: [id number] Date of Birth: 1955-02-14

## 2021-02-24 NOTE — Therapy (Signed)
[name], [name], [address] Phone: [phone]   Fax:  [phone]  Occupational Therapy Treatment  Patient Details  Name: Kim Gomez MRN: [id number] Date of Birth: 05-13-1955 Referring Provider (OT): Joselyn Arrow   Encounter Date: 02/24/2021   OT End of Session - 02/25/21 1123     Visit Number 2    Number of Visits 24    Date for OT Re-Evaluation 04/25/21    OT Start Time 0900    OT Stop Time 0945    OT Time Calculation (min) 45 min    Activity Tolerance Patient tolerated treatment well    Behavior During Therapy [facility] for tasks assessed/performed             Past Medical History:  Diagnosis Date   Anxiety    Arthritis    legs, hands   Cellulitis of right knee    started on antibiotics 06/21/15.     Depression    Family history of adverse reaction to anesthesia    sister and mom -PONV   Hypercholesteremia    Hypertension     Past Surgical History:  Procedure Laterality Date   ABDOMINAL HYSTERECTOMY     COLONOSCOPY WITH PROPOFOL N/A 06/27/2015   Procedure: COLONOSCOPY WITH PROPOFOL;  Surgeon: Lucilla Lame, MD;  Location: [place];  Service: Endoscopy;  Laterality: N/A;   POLYPECTOMY  06/27/2015   Procedure: POLYPECTOMY;  Surgeon: Lucilla Lame, MD;  Location: [place];  Service: Endoscopy;;   TOTAL HIP ARTHROPLASTY Right 09/10/2019   Procedure: TOTAL HIP ARTHROPLASTY ANTERIOR APPROACH;  Surgeon: Lovell Sheehan, MD;  Location: [facility];  Service: Orthopedics;  Laterality: Right;    There were no vitals filed for this visit.   Subjective Assessment - 02/25/21 1123     Subjective  Pt reports she was diagnosed with bacterial pneumonia and could not come to her therapy appts for the past few weeks.    Pertinent History Kim Gomez is a 66 y.o. female with PMH significant for HTN, HLD, anxiety disorder, recently hospitalized for COVID-19 infection (08/06/2020) at  an outside facility. Patient was brought to the ED on 7/24 after she had a sudden onset of numbness involving right side of her body without any motor deficits, dysarthria, dysphagia. 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 Per chart for her f/u neuro visit:Left ischemic thalamic/internal capsule infarct presenting with right-sided numbness, weakness - now with residual cognitive fog (improving), imbalance (improving), fatigue, right first + second digit numbness, in patient with known vascular risk factors of hypertension, dyslipidemia, lack of physical activity, snoring    Patient Stated Goals Pt reports she wants to get back to doing things more independently like she was before COVID and CVA.    Currently in Pain? No/denies    Pain Score 0-No pain    Pain Onset More than a month ago             Pt reports increased pain with shoulder ADD reaching across to left side.   UBE from seated position, 8 mins total, forwards/backwards.  Therapist in constant attendance to adjust settings and to ensure grip on the right side.  Reaching with small dowels and med/large washers to pick up and place onto dowels in multi planes of motion to encourage reach, some mild pain when reaching across in ADD.   Manipulation skills to pick up med to  large washers, translatory skills of the hand and using the hand for storage. Cues for prehension patterns for proper form and technique.    Handwriting with focus on printing and signing name, formulating list of 10 fruits with good legibility, has to sometimes pause and think of how to form and/or spell words correctly.    Pt has a calendar and folder to keep up with appointments, reports it is difficult to keep up with all her short term disability information.    Response to tx: Pt not since since her initial evaluation since she has been sick with pneumonia and not feeling well.  She reports issues  at times with fatigue and toleration of increased activity.  Pt with some mild pain in bicep area with ADD of arm with reach.  Pt requires cues at times for prehension patterns, translatory skills of the hand and using the hand for storage.  Slower to move with tasks and manipulation skills, dropping items occasionally.  With handwriting, her legibility is good, decreased endurance for longer passages and has some difficulty with recalling and/or spelling words.  Continue to work towards goals in plan of care to maximize safety and independence in necessary daily tasks.                       OT Long Term Goals - 01/31/21 1707       OT LONG TERM GOAL #1   Title Patient will be independent with home exercise program    Baseline No current program    Time 12    Period Weeks    Status New    Target Date 04/25/21      OT LONG TERM GOAL #2   Title Patient will complete basic self-care tasks with modified independence and without increased time to complete tasks    Baseline Patient currently has difficulty with select self-care tasks and requires increased time to complete    Time 6    Period Weeks    Status New    Target Date 03/14/21      OT LONG TERM GOAL #3   Title Patient will demonstrate good safety awareness in the kitchen with cooking tasks with supervision    Baseline Decreased safety awareness at evaluation    Time 12    Period Weeks    Status New    Target Date 04/25/21      OT LONG TERM GOAL #4   Title Patient will increase right grip strength by 10 pounds to be able to open jars and containers with modified independence    Baseline 28 pounds at eval and difficulty with managing jars and containers    Time 12    Period Weeks    Status New    Target Date 04/25/21      OT LONG TERM GOAL #5   Title Patient will improve right pinch strength by 5 pounds to pick up and manipulate items with modified independence    Baseline Right lateral 5 pounds, 3 point 4#     Time 12    Period Weeks    Status New    Target Date 04/25/21      Long Term Additional Goals   Additional Long Term Goals Yes      OT LONG TERM GOAL #6   Title Patient will decrease right shoulder pain to 2 out of 10 or less with movement patterns for daily activities.    Baseline Increased pain in  right shoulder, 5 out of 10 with movement.    Time 12    Period Weeks    Status New    Target Date 04/25/21      OT LONG TERM GOAL #7   Title Patient will demonstrate score of 59 or greater on FOTO to show a clinically relevant change to impact self-care activities with greater independence    Baseline Score of 55 at eval    Time 12    Period Weeks    Status New    Target Date 04/25/21      OT LONG TERM GOAL #8   Title Will assess for potential return to work tasks as patient progresses with therapy    Baseline Patient currently unable to perform her job as outlined in her job description    Time [place]    Target Date 04/25/21                   Plan - 02/25/21 1123     Clinical Impression Statement Pt not since since her initial evaluation since she has been sick with pneumonia and not feeling well.  She reports issues at times with fatigue and toleration of increased activity.  Pt with some mild pain in bicep area with ADD of arm with reach.  Pt requires cues at times for prehension patterns, translatory skills of the hand and using the hand for storage.  Slower to move with tasks and manipulation skills, dropping items occasionally.  With handwriting, her legibility is good, decreased endurance for longer passages and has some difficulty with recalling and/or spelling words.  Continue to work towards goals in plan of care to maximize safety and independence in necessary daily tasks.    OT Occupational Profile and History Problem Focused Assessment - Including review of records relating to presenting problem    Occupational performance deficits  (Please refer to evaluation for details): ADL's;IADL's;Social Participation    Body Structure / Function / Physical Skills ADL;Dexterity;ROM;Strength;Coordination;FMC;IADL;Pain;Sensation;UE functional use;Decreased knowledge of use of DME    Cognitive Skills Memory;Safety Awareness    Psychosocial Skills Environmental  Adaptations;Habits;Routines and Behaviors    Rehab Potential Good    Clinical Decision Making Several treatment options, min-mod task modification necessary    Comorbidities Affecting Occupational Performance: May have comorbidities impacting occupational performance    Modification or Assistance to Complete Evaluation  No modification of tasks or assist necessary to complete eval    OT Frequency 2x / week    OT Duration 12 weeks    OT Treatment/Interventions Self-care/ADL training;Cryotherapy;Paraffin;Therapeutic exercise;DME and/or AE instruction;Cognitive remediation/compensation;Neuromuscular education;Manual Therapy;Moist Heat;Contrast Bath;Therapeutic activities;Patient/family education    Consulted and Agree with Plan of Care Patient             Patient will benefit from skilled therapeutic intervention in order to improve the following deficits and impairments:   Body Structure / Function / Physical Skills: ADL, Dexterity, ROM, Strength, Coordination, FMC, IADL, Pain, Sensation, UE functional use, Decreased knowledge of use of DME Cognitive Skills: Memory, Safety Awareness Psychosocial Skills: Environmental  Adaptations, Habits, Routines and Behaviors   Visit Diagnosis: Muscle weakness (generalized)  Other lack of coordination  Unsteadiness on feet    Problem List Patient Active Problem List   Diagnosis Date Noted   Acute stroke due to ischemia ([place]) 08/10/2020   History of total hip replacement, right 09/10/2019   Special screening for malignant neoplasms, colon  Benign neoplasm of ascending colon    Benign neoplasm of descending colon    Asiel Chrostowski Oneita Jolly, OTR/L, CLT  Floy Riegler, OT 02/26/2021, 11:31 AM  [place] MAIN [facility] SERVICES [address] [place], [name], [address] Phone: [phone]   Fax:  [phone]  Name: Kim Gomez MRN: [id number] Date of Birth: 1955/08/15

## 2021-02-24 NOTE — Patient Instructions (Signed)
Organize calendar with highlighted categories (rehab home/family medical) Utilize timer when cooking and checkbox highlighting for following the recipe (reviewed in session)

## 2021-02-26 ENCOUNTER — Ambulatory Visit: Payer: No Typology Code available for payment source

## 2021-02-26 ENCOUNTER — Other Ambulatory Visit: Payer: Self-pay

## 2021-02-26 ENCOUNTER — Ambulatory Visit: Payer: No Typology Code available for payment source | Admitting: Speech Pathology

## 2021-02-26 DIAGNOSIS — M6281 Muscle weakness (generalized): Secondary | ICD-10-CM

## 2021-02-26 DIAGNOSIS — R278 Other lack of coordination: Secondary | ICD-10-CM

## 2021-02-26 DIAGNOSIS — M25611 Stiffness of right shoulder, not elsewhere classified: Secondary | ICD-10-CM

## 2021-02-27 NOTE — Therapy (Signed)
[place] MAIN [facility] SERVICES [address] [place], [name], [address] Phone: [phone]   Fax:  [phone]  Occupational Therapy Treatment  Patient Details  Name: Kim Gomez MRN: [id number] Date of Birth: 12-23-1955 Referring Provider (OT): Joselyn Arrow   Encounter Date: 02/26/2021   OT End of Session - 02/27/21 0820     Visit Number 3    Number of Visits 24    Date for OT Re-Evaluation 04/25/21    OT Start Time 1100    OT Stop Time 1145    OT Time Calculation (min) 45 min    Activity Tolerance Patient limited by pain    Behavior During Therapy [facility] for tasks assessed/performed             Past Medical History:  Diagnosis Date   Anxiety    Arthritis    legs, hands   Cellulitis of right knee    started on antibiotics 06/21/15.     Depression    Family history of adverse reaction to anesthesia    sister and mom -PONV   Hypercholesteremia    Hypertension     Past Surgical History:  Procedure Laterality Date   ABDOMINAL HYSTERECTOMY     COLONOSCOPY WITH PROPOFOL N/A 06/27/2015   Procedure: COLONOSCOPY WITH PROPOFOL;  Surgeon: Lucilla Lame, MD;  Location: [place];  Service: Endoscopy;  Laterality: N/A;   POLYPECTOMY  06/27/2015   Procedure: POLYPECTOMY;  Surgeon: Lucilla Lame, MD;  Location: [place];  Service: Endoscopy;;   TOTAL HIP ARTHROPLASTY Right 09/10/2019   Procedure: TOTAL HIP ARTHROPLASTY ANTERIOR APPROACH;  Surgeon: Lovell Sheehan, MD;  Location: [facility];  Service: Orthopedics;  Laterality: Right;    There were no vitals filed for this visit.   Subjective Assessment - 02/26/21 0817     Subjective  "Pt reports she's had 2 visits at [facility] for PT to address her rotator cuff tear." (pt planning to transition all therapy to [facility])    Pertinent History Kim Gomez is a 66 y.o. female with PMH significant for HTN, HLD, anxiety disorder, recently hospitalized for COVID-19 infection  (08/06/2020) at an outside facility. Patient was brought to the ED on 7/24 after she had a sudden onset of numbness involving right side of her body without any motor deficits, dysarthria, dysphagia. 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 Per chart for her f/u neuro visit:Left ischemic thalamic/internal capsule infarct presenting with right-sided numbness, weakness - now with residual cognitive fog (improving), imbalance (improving), fatigue, right first + second digit numbness, in patient with known vascular risk factors of hypertension, dyslipidemia, lack of physical activity, snoring    Patient Stated Goals Pt reports she wants to get back to doing things more independently like she was before COVID and CVA.    Currently in Pain? Yes    Pain Score 6     Pain Location Shoulder    Pain Orientation Right    Pain Descriptors / Indicators Aching;Tightness;Sore    Pain Type Chronic pain    Pain Onset More than a month ago    Pain Frequency Intermittent    Aggravating Factors  R shoulder flex/abd/horiz add    Pain Relieving Factors rest, heat, tylenol    Effect of Pain on Daily Activities limited reaching with RUE    Multiple Pain Sites No            Occupational Therapy Treatment:  Therapeutic Exercise: Moist heat applied to R shoulder intermittently throughout session simultaneous to performance of therapeutic exercise, working to relax muscles and reduce pain in R shoulder.  Facilitated hand strengthening with use of hand gripper set at 11.2# to remove jumbo pegs from pegboard x2 trials using R hand.  Rest breaks between sets.  Facilitated pinch strengthening with use of therapy resistant clothespins to target lateral and 3 point pinch of R hand using all pin colors.  OT promoted gentle reach for R shoulder forward flex/abd/horiz abd/add alternating placement of dowel and pins, adjusted placement as needed to keep exercises within  a tolerable range for R shoulder.  Instructed pt in table slides for R shoulder flex and horiz abd/add with good return demo.  OT encouraged completion at home daily.   Response to Treatment: See Plan/clinical impression below.    OT Education - 02/26/21 0819     Education Details HEP    Person(s) Educated Patient    Methods Explanation    Comprehension Verbalized understanding;Returned demonstration;Verbal cues required;Need further instruction                 OT Long Term Goals - 01/31/21 1707       OT LONG TERM GOAL #1   Title Patient will be independent with home exercise program    Baseline No current program    Time 12    Period Weeks    Status New    Target Date 04/25/21      OT LONG TERM GOAL #2   Title Patient will complete basic self-care tasks with modified independence and without increased time to complete tasks    Baseline Patient currently has difficulty with select self-care tasks and requires increased time to complete    Time 6    Period Weeks    Status New    Target Date 03/14/21      OT LONG TERM GOAL #3   Title Patient will demonstrate good safety awareness in the kitchen with cooking tasks with supervision    Baseline Decreased safety awareness at evaluation    Time 12    Period Weeks    Status New    Target Date 04/25/21      OT LONG TERM GOAL #4   Title Patient will increase right grip strength by 10 pounds to be able to open jars and containers with modified independence    Baseline 28 pounds at eval and difficulty with managing jars and containers    Time 12    Period Weeks    Status New    Target Date 04/25/21      OT LONG TERM GOAL #5   Title Patient will improve right pinch strength by 5 pounds to pick up and manipulate items with modified independence    Baseline Right lateral 5 pounds, 3 point 4#    Time 12    Period Weeks    Status New    Target Date 04/25/21      Long Term Additional Goals   Additional Long Term Goals  Yes      OT LONG TERM GOAL #6   Title Patient will decrease right shoulder pain to 2 out of 10 or less with movement patterns for daily activities.    Baseline Increased pain in right shoulder, 5 out of 10 with movement.    Time 12    Period Weeks    Status New    Target Date 04/25/21  OT LONG TERM GOAL #7   Title Patient will demonstrate score of 59 or greater on FOTO to show a clinically relevant change to impact self-care activities with greater independence    Baseline Score of 55 at eval    Time 12    Period Weeks    Status New    Target Date 04/25/21      OT LONG TERM GOAL #8   Title Will assess for potential return to work tasks as patient progresses with therapy    Baseline Patient currently unable to perform her job as outlined in her job description    Time 12    Period Weeks    Status New    Target Date 04/25/21              Plan - 02/26/21 0829     Clinical Impression Statement Pt reporting the most pain in R shoulder with forward flexion, abd, and horiz add.  Fair tolerance to table slides and gentle reaching activities this day, with moist heat effective for pain management throughout session.  OT encouraged gentle daily stretching at home with table slides and use of heating pad as needed.  OT also encouraged taking a tylenol prior to OT visits for improved tolerance to therapeutic activities.  Pt receptive to all.  Pt will continue to benefit from skilled OT for increasing RUE strength, coordination, and shoulder flexibility in order to maximize indep with daily tasks.    OT Occupational Profile and History Problem Focused Assessment - Including review of records relating to presenting problem    Occupational performance deficits (Please refer to evaluation for details): ADL's;IADL's;Social Participation    Body Structure / Function / Physical Skills ADL;Dexterity;ROM;Strength;Coordination;FMC;IADL;Pain;Sensation;UE functional use;Decreased knowledge of use of  DME    Cognitive Skills Memory;Safety Awareness    Psychosocial Skills Environmental  Adaptations;Habits;Routines and Behaviors    Rehab Potential Good    Clinical Decision Making Several treatment options, min-mod task modification necessary    Comorbidities Affecting Occupational Performance: May have comorbidities impacting occupational performance    Modification or Assistance to Complete Evaluation  No modification of tasks or assist necessary to complete eval    OT Frequency 2x / week    OT Duration 12 weeks    OT Treatment/Interventions Self-care/ADL training;Cryotherapy;Paraffin;Therapeutic exercise;DME and/or AE instruction;Cognitive remediation/compensation;Neuromuscular education;Manual Therapy;Moist Heat;Contrast Bath;Therapeutic activities;Patient/family education    Consulted and Agree with Plan of Care Patient             Patient will benefit from skilled therapeutic intervention in order to improve the following deficits and impairments:   Body Structure / Function / Physical Skills: ADL, Dexterity, ROM, Strength, Coordination, FMC, IADL, Pain, Sensation, UE functional use, Decreased knowledge of use of DME Cognitive Skills: Memory, Safety Awareness Psychosocial Skills: Environmental  Adaptations, Habits, Routines and Behaviors   Visit Diagnosis: Muscle weakness (generalized)  Other lack of coordination  Stiffness of right shoulder, not elsewhere classified    Problem List Patient Active Problem List   Diagnosis Date Noted   Acute stroke due to ischemia ([place]) 08/10/2020   History of total hip replacement, right 09/10/2019   Special screening for malignant neoplasms, colon    Benign neoplasm of ascending colon    Benign neoplasm of descending colon    Leta Speller, MS, OTR/L  Darleene Cleaver, OT 02/27/2021, 8:29 AM  [place], [name], [address] Phone: [phone]   Fax:  [phone]  Name: Kim Gomez MRN: [id number] Date of Birth: 11-23-1955

## 2021-03-02 ENCOUNTER — Encounter: Payer: Self-pay | Admitting: Occupational Therapy

## 2021-03-02 ENCOUNTER — Ambulatory Visit: Payer: No Typology Code available for payment source | Admitting: Physical Therapy

## 2021-03-02 ENCOUNTER — Ambulatory Visit: Payer: No Typology Code available for payment source | Admitting: Occupational Therapy

## 2021-03-02 ENCOUNTER — Other Ambulatory Visit: Payer: Self-pay

## 2021-03-02 DIAGNOSIS — M6281 Muscle weakness (generalized): Secondary | ICD-10-CM | POA: Diagnosis not present

## 2021-03-02 DIAGNOSIS — M25611 Stiffness of right shoulder, not elsewhere classified: Secondary | ICD-10-CM

## 2021-03-02 DIAGNOSIS — R278 Other lack of coordination: Secondary | ICD-10-CM

## 2021-03-02 DIAGNOSIS — R269 Unspecified abnormalities of gait and mobility: Secondary | ICD-10-CM

## 2021-03-02 NOTE — Therapy (Signed)
[place] MAIN [facility] SERVICES [address] [place], [name], [address] Phone: [phone]   Fax:  [phone]  Occupational Therapy Treatment  Patient Details  Name: Kim Gomez MRN: [id number] Date of Birth: Dec 14, 1955 Referring Provider (OT): Joselyn Arrow   Encounter Date: 03/02/2021   OT End of Session - 03/05/21 0856     Visit Number 4    Number of Visits 24    Date for OT Re-Evaluation 04/25/21    OT Start Time 1020    OT Stop Time 1100    OT Time Calculation (min) 40 min    Activity Tolerance Patient limited by pain    Behavior During Therapy [facility] for tasks assessed/performed             Past Medical History:  Diagnosis Date   Anxiety    Arthritis    legs, hands   Cellulitis of right knee    started on antibiotics 06/21/15.     Depression    Family history of adverse reaction to anesthesia    sister and mom -PONV   Hypercholesteremia    Hypertension     Past Surgical History:  Procedure Laterality Date   ABDOMINAL HYSTERECTOMY     COLONOSCOPY WITH PROPOFOL N/A 06/27/2015   Procedure: COLONOSCOPY WITH PROPOFOL;  Surgeon: Lucilla Lame, MD;  Location: [place];  Service: Endoscopy;  Laterality: N/A;   POLYPECTOMY  06/27/2015   Procedure: POLYPECTOMY;  Surgeon: Lucilla Lame, MD;  Location: [place];  Service: Endoscopy;;   TOTAL HIP ARTHROPLASTY Right 09/10/2019   Procedure: TOTAL HIP ARTHROPLASTY ANTERIOR APPROACH;  Surgeon: Lovell Sheehan, MD;  Location: [facility];  Service: Orthopedics;  Laterality: Right;    There were no vitals filed for this visit.   Subjective Assessment - 03/05/21 0856     Subjective  Pt reports she had a good weekend, pain rated as 3/10 in right shoulder    Pertinent History Kim Gomez is a 66 y.o. female with PMH significant for HTN, HLD, anxiety disorder, recently hospitalized for COVID-19 infection (08/06/2020) at an outside facility. Patient was brought to the ED on  7/24 after she had a sudden onset of numbness involving right side of her body without any motor deficits, dysarthria, dysphagia. 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 Per chart for her f/u neuro visit:Left ischemic thalamic/internal capsule infarct presenting with right-sided numbness, weakness - now with residual cognitive fog (improving), imbalance (improving), fatigue, right first + second digit numbness, in patient with known vascular risk factors of hypertension, dyslipidemia, lack of physical activity, snoring    Patient Stated Goals Pt reports she wants to get back to doing things more independently like she was before COVID and CVA.    Currently in Pain? Yes    Pain Score 4     Pain Location Shoulder    Pain Orientation Right    Pain Descriptors / Indicators Aching;Tightness    Pain Type Chronic pain    Pain Onset More than a month ago    Pain Frequency Intermittent            Moist heat to right shoulder prior to treatment to decrease pain, increase tissue mobility and increase ROM.   ROM stretches by therapist for shoulder flexion, ABD, ER, elbow flex/ext, supination/pronation, wrist flex/ext, digit flex/extension.  All motions followed by A/AAROM for same motions listed previously.  Finger ABD/ADD, tapping.  Pt performing functional Reaching with shape tower placing shapes from tabletop to 1st graduated level from a seated position.  Difficulty with advancing to second level based on pain response.    Finger strengthening with use of therapeutic resistive red putty, therapist placed 15 pegs into putty and pt worked to remove all pegs with use of pinch as well as coordination.  Repeated with green resistive putty for 15 more.  Occasional cues by therapist for pinch patterns.    Neuromuscular Reeducation: Pt seen for manipulation of playing cards with emphasis flipping cards with use of thumb finger combinations.   Pt performing fingers on top, thumb on bottom to flip towards her, then thumb on top, fingers on bottom to flip away.  Pt performing each type of flip in isolation and then attempting to perform in an alternating pattern with focus on speed and dexterity.    Response to tx: Pt continues to progress towards goals, limited by pain at times in her right UE with functional reaching due to rotator cuff issues.  Requires cues at right shoulder to decrease any hiking and/or substitution of movements.  Able to perform resistive putty and advance one level.  Improving with coordination and manipulation skills with right hand and responds well to cues.  Continue to work towards goals in plan of care to maximize safety and independence in necessary daily tasks.                       OT Education - 03/05/21 0856     Education Details HEP    Person(s) Educated Patient    Methods Explanation    Comprehension Verbalized understanding;Returned demonstration;Verbal cues required;Need further instruction                 OT Long Term Goals - 01/31/21 1707       OT LONG TERM GOAL #1   Title Patient will be independent with home exercise program    Baseline No current program    Time 12    Period Weeks    Status New    Target Date 04/25/21      OT LONG TERM GOAL #2   Title Patient will complete basic self-care tasks with modified independence and without increased time to complete tasks    Baseline Patient currently has difficulty with select self-care tasks and requires increased time to complete    Time 6    Period Weeks    Status New    Target Date 03/14/21      OT LONG TERM GOAL #3   Title Patient will demonstrate good safety awareness in the kitchen with cooking tasks with supervision    Baseline Decreased safety awareness at evaluation    Time 12    Period Weeks    Status New    Target Date 04/25/21      OT LONG TERM GOAL #4   Title Patient will increase right  grip strength by 10 pounds to be able to open jars and containers with modified independence    Baseline 28 pounds at eval and difficulty with managing jars and containers    Time 12    Period Weeks    Status New    Target Date 04/25/21      OT LONG TERM GOAL #5   Title Patient will improve right pinch strength by 5 pounds to pick up and manipulate items with modified independence    Baseline Right lateral 5 pounds,  3 point 4#    Time 12    Period Weeks    Status New    Target Date 04/25/21      Long Term Additional Goals   Additional Long Term Goals Yes      OT LONG TERM GOAL #6   Title Patient will decrease right shoulder pain to 2 out of 10 or less with movement patterns for daily activities.    Baseline Increased pain in right shoulder, 5 out of 10 with movement.    Time 12    Period Weeks    Status New    Target Date 04/25/21      OT LONG TERM GOAL #7   Title Patient will demonstrate score of 59 or greater on FOTO to show a clinically relevant change to impact self-care activities with greater independence    Baseline Score of 55 at eval    Time 12    Period Weeks    Status New    Target Date 04/25/21      OT LONG TERM GOAL #8   Title Will assess for potential return to work tasks as patient progresses with therapy    Baseline Patient currently unable to perform her job as outlined in her job description    Time [place]    Target Date 04/25/21                   Plan - 03/05/21 0857     Clinical Impression Statement Pt continues to progress towards goals, limited by pain at times in her right UE with functional reaching due to rotator cuff issues.  Requires cues at right shoulder to decrease any hiking and/or substitution of movements.  Able to perform resistive putty and advance one level.  Improving with coordination and manipulation skills with right hand and responds well to cues.  Continue to work towards goals in plan of care  to maximize safety and independence in necessary daily tasks.    OT Occupational Profile and History Problem Focused Assessment - Including review of records relating to presenting problem    Occupational performance deficits (Please refer to evaluation for details): ADL's;IADL's;Social Participation    Body Structure / Function / Physical Skills ADL;Dexterity;ROM;Strength;Coordination;FMC;IADL;Pain;Sensation;UE functional use;Decreased knowledge of use of DME    Cognitive Skills Memory;Safety Awareness    Psychosocial Skills Environmental  Adaptations;Habits;Routines and Behaviors    Rehab Potential Good    Clinical Decision Making Several treatment options, min-mod task modification necessary    Comorbidities Affecting Occupational Performance: May have comorbidities impacting occupational performance    Modification or Assistance to Complete Evaluation  No modification of tasks or assist necessary to complete eval    OT Frequency 2x / week    OT Duration 12 weeks    OT Treatment/Interventions Self-care/ADL training;Cryotherapy;Paraffin;Therapeutic exercise;DME and/or AE instruction;Cognitive remediation/compensation;Neuromuscular education;Manual Therapy;Moist Heat;Contrast Bath;Therapeutic activities;Patient/family education    Consulted and Agree with Plan of Care Patient             Patient will benefit from skilled therapeutic intervention in order to improve the following deficits and impairments:   Body Structure / Function / Physical Skills: ADL, Dexterity, ROM, Strength, Coordination, FMC, IADL, Pain, Sensation, UE functional use, Decreased knowledge of use of DME Cognitive Skills: Memory, Safety Awareness Psychosocial Skills: Environmental  Adaptations, Habits, Routines and Behaviors   Visit Diagnosis: Muscle weakness (generalized)  Other lack of coordination  Stiffness of right shoulder,  not elsewhere classified    Problem List Patient Active Problem List    Diagnosis Date Noted   Acute stroke due to ischemia ([place]) 08/10/2020   History of total hip replacement, right 09/10/2019   Special screening for malignant neoplasms, colon    Benign neoplasm of ascending colon    Benign neoplasm of descending colon    Haylyn Halberg Oneita Jolly, OTR/L, CLT  Jamaar Howes, OT 03/06/2021, 9:12 AM  [place], [name], [address] Phone: [phone]   Fax:  [phone]  Name: Kim Gomez MRN: [id number] Date of Birth: [birth year]

## 2021-03-02 NOTE — Therapy (Signed)
[place] MAIN [facility] SERVICES [address] [place], [name], [address] Phone: [phone]   Fax:  [phone]  Physical Therapy Treatment  Patient Details  Name: Kim Gomez MRN: [id number] Date of Birth: Feb 16, 1955 Referring Provider (PT): Jennings Books MD   Encounter Date: 03/02/2021   PT End of Session - 03/02/21 0940     Visit Number 6    Number of Visits 24    Date for PT Re-Evaluation 04/14/21    Progress Note Due on Visit 10    PT Start Time 0930    PT Stop Time 1014    PT Time Calculation (min) 44 min    Equipment Utilized During Treatment Gait belt    Activity Tolerance Patient tolerated treatment well    Behavior During Therapy [facility] for tasks assessed/performed             Past Medical History:  Diagnosis Date   Anxiety    Arthritis    legs, hands   Cellulitis of right knee    started on antibiotics 06/21/15.     Depression    Family history of adverse reaction to anesthesia    sister and mom -PONV   Hypercholesteremia    Hypertension     Past Surgical History:  Procedure Laterality Date   ABDOMINAL HYSTERECTOMY     COLONOSCOPY WITH PROPOFOL N/A 06/27/2015   Procedure: COLONOSCOPY WITH PROPOFOL;  Surgeon: Lucilla Lame, MD;  Location: [place];  Service: Endoscopy;  Laterality: N/A;   POLYPECTOMY  06/27/2015   Procedure: POLYPECTOMY;  Surgeon: Lucilla Lame, MD;  Location: [place];  Service: Endoscopy;;   TOTAL HIP ARTHROPLASTY Right 09/10/2019   Procedure: TOTAL HIP ARTHROPLASTY ANTERIOR APPROACH;  Surgeon: Lovell Sheehan, MD;  Location: [facility];  Service: Orthopedics;  Laterality: Right;    There were no vitals filed for this visit.   Subjective Assessment - 03/02/21 0934     Subjective no falls, stumbles, LOB since previous appointrment.Pt has been sick for last several weeks but still attempted to continue with her HEP as she felt able. Pt reports she will be having PT for her shoulder  here from this point on. Pt reports she has some trouble reaching up with the right shoulder. Pt reports she has been doing table slide exercises, AAROM exercsies with wand, and some she is unable to remember. She was instructed to bring her HEP from her last PT in order for best continuation of services. Pt evaluated for strength and ROM deficits this date.    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 Pt reports a fall on october 29th. Pt rpeorts she hurt the R UE with the fall and she reports some stiffness in the knee as well as general weakness on the right side. Pt reports she has had 2 strokes but the first went under the radar and was only evidenced by imaging presented to her by her MD. Pt reports she has been modifying her activities in order to be careful not to fall since the stroke. Reports she has been taking her time with a lot of things. Pt reports she has a membership at [employer] and she has been walking on the treadmill 2-3 times per week, she reports she walks on the treadmill for an hour at a time following working her way  up. Pt reports ocassional numbness and tingling in her hands.    Limitations House hold activities    How long can you walk comfortably? 1 hour with UE support    Patient Stated Goals improve strength and balance in the lower extremities.    Pain Onset More than a month ago             Exercise/Activity Sets/ Reps/Time/ Resistance Assistance Charge type Comments  1 foot airex 1 foot step with ball hand off  1 x 45 sec ea  cga Neuro re-ed Moderate difficulty, increased difficulty with R LE posterior   Tandem on airex with Dual task  2 round, 1 with ea LE post/ant  CGA Neuro re-ed [employer] word game for DT cognitive and motor task    Shoulder range of motion assessment     127flex 100:ABD IR: to PSIS  (functional range)  ER: to C2 ( funcitonal range)  LAQ  2 x 10 x 5 sec hold 5#  Therex  Moderate difficulty, more difficulty on R LE         STS - airex pad  2 x 10  cga Neuro re-ed  NO UE use this session, pt rated medium   Heel raise on 1/2 FR (round side down)  2 x 12  CGA Neuro  UE assist on all reps, unable to complete without UE assist this session.  -cues for proper positioning                           Treatment provided this session  Pt educated throughout session about proper posture and technique with exercises. Improved exercise technique, movement at target joints, use of target muscles after min to mod verbal, visual, tactile cues.  Note: Portions of this document were prepared using Dragon voice recognition software and although reviewed may contain unintentional dictation errors in syntax, grammar, or spelling.                                 PT Short Term Goals - 01/20/21 0915       PT SHORT TERM GOAL #1   Title Patient will be independent in home exercise program to improve strength/mobility for better functional independence with ADLs.    Baseline no HEP at this time    Time 4    Period Weeks    Status New    Target Date 02/17/21               PT Long Term Goals - 01/20/21 0915       PT LONG TERM GOAL #1   Title Patient will increase FOTO score to equal to or greater than  50   to demonstrate statistically significant improvement in mobility and quality of life.    Baseline 41 on 01/20/21    Time 8    Period Weeks    Status New    Target Date 03/17/21      PT LONG TERM GOAL #2   Title Pt will improve mini BEST test by 4 points or more in order to indicate clinically significant improvent in balance.    Baseline see flowsheets    Time 12    Period Weeks    Status New    Target Date 04/14/21      PT LONG TERM GOAL #3   Title Patient  will complete  five times sit to stand test in < 15 seconds indicating an increased  LE strength and improved balance.    Baseline see flowsheets    Time 12    Period Weeks    Status New    Target Date 04/14/21      PT LONG TERM GOAL #4   Title Patient will increase 10 meter walk test to >1.86m/s as to improve gait speed for better community ambulation and to reduce fall risk.    Baseline .86 m/s on 01/20/21    Time 8    Period Weeks    Status New    Target Date 03/17/21      PT LONG TERM GOAL #5   Title Pty will improve dual task TUG to less than 20 seconds in order to indicate improved cognitive and motor tasks.    Baseline 28.82 on 01/20/21    Time 12    Period Weeks    Status New    Target Date 04/14/21                   Plan - 03/02/21 0940     Clinical Impression Statement Pt presents with excellent motivation for completion of physical therapy program. Pt performed several balance and neuromuscular re-education activities in today's session and tolerated them well Continued with dual task interventions this session and challenged with decreased BOS.  Pt will continue to benefit from skilled PT intervention to improve her balance, strength and reduce her risk of falls in addition to increased her confidence with everyday activities.    Personal Factors and Comorbidities Age;Comorbidity 1;Comorbidity 2    Comorbidities arthritis, anxiety, HTN, hyperlipedemia.    Examination-Activity Limitations Carry;Lift;Locomotion Level;Squat;Stairs    Examination-Participation Restrictions Cleaning;Yard Work;Meal Prep    Stability/Clinical Decision Making Stable/Uncomplicated    Rehab Potential Good    PT Frequency 2x / week    PT Duration 12 weeks    PT Treatment/Interventions ADLs/Self Care Home Management;Gait training;Therapeutic activities;Functional mobility training;Therapeutic exercise;Balance training;Neuromuscular re-education;Manual techniques;Energy conservation;Passive range of motion    PT Next Visit Plan Continue with activity progressions, integration  of dual tasks    PT Home Exercise Plan Access Code: [email or web address]  URL: [email or web address]    Consulted and Agree with Plan of Care Patient             Patient will benefit from skilled therapeutic intervention in order to improve the following deficits and impairments:  Decreased activity tolerance, Decreased strength, Decreased mobility, Decreased balance  Visit Diagnosis: Abnormality of gait and mobility  Stiffness of right shoulder, not elsewhere classified     Problem List Patient Active Problem List   Diagnosis Date Noted   Acute stroke due to ischemia ([place]) 08/10/2020   History of total hip replacement, right 09/10/2019   Special screening for malignant neoplasms, colon    Benign neoplasm of ascending colon    Benign neoplasm of descending colon     Particia Lather, PT 03/02/2021, 2:40 PM  [place] [address] [place], [name], [address] Phone: [phone]   Fax:  [phone]  Name: MIELA DESJARDIN MRN: [id number] Date of Birth: May 20, 1955

## 2021-03-04 ENCOUNTER — Ambulatory Visit: Payer: No Typology Code available for payment source | Admitting: Physical Therapy

## 2021-03-04 ENCOUNTER — Encounter: Payer: Self-pay | Admitting: Occupational Therapy

## 2021-03-04 ENCOUNTER — Ambulatory Visit: Payer: No Typology Code available for payment source | Admitting: Occupational Therapy

## 2021-03-04 ENCOUNTER — Other Ambulatory Visit: Payer: Self-pay

## 2021-03-04 DIAGNOSIS — M6281 Muscle weakness (generalized): Secondary | ICD-10-CM

## 2021-03-04 DIAGNOSIS — R2681 Unsteadiness on feet: Secondary | ICD-10-CM

## 2021-03-04 DIAGNOSIS — M25611 Stiffness of right shoulder, not elsewhere classified: Secondary | ICD-10-CM

## 2021-03-04 DIAGNOSIS — R278 Other lack of coordination: Secondary | ICD-10-CM

## 2021-03-04 DIAGNOSIS — R269 Unspecified abnormalities of gait and mobility: Secondary | ICD-10-CM

## 2021-03-04 NOTE — Therapy (Signed)
[place] MAIN [facility] SERVICES [address] [place], [name], [address] Phone: [phone]   Fax:  [phone]  Physical Therapy Treatment  Patient Details  Name: Kim Gomez MRN: [id number] Date of Birth: 1955/10/12 Referring Provider (PT): Jennings Books MD   Encounter Date: 03/04/2021   PT End of Session - 03/04/21 0813     Visit Number 7    Number of Visits 24    Date for PT Re-Evaluation 04/14/21    Progress Note Due on Visit 10    PT Start Time 0804    PT Stop Time 0845    PT Time Calculation (min) 41 min    Equipment Utilized During Treatment Gait belt    Activity Tolerance Patient tolerated treatment well    Behavior During Therapy [facility] for tasks assessed/performed             Past Medical History:  Diagnosis Date   Anxiety    Arthritis    legs, hands   Cellulitis of right knee    started on antibiotics 06/21/15.     Depression    Family history of adverse reaction to anesthesia    sister and mom -PONV   Hypercholesteremia    Hypertension     Past Surgical History:  Procedure Laterality Date   ABDOMINAL HYSTERECTOMY     COLONOSCOPY WITH PROPOFOL N/A 06/27/2015   Procedure: COLONOSCOPY WITH PROPOFOL;  Surgeon: Lucilla Lame, MD;  Location: [place];  Service: Endoscopy;  Laterality: N/A;   POLYPECTOMY  06/27/2015   Procedure: POLYPECTOMY;  Surgeon: Lucilla Lame, MD;  Location: [place];  Service: Endoscopy;;   TOTAL HIP ARTHROPLASTY Right 09/10/2019   Procedure: TOTAL HIP ARTHROPLASTY ANTERIOR APPROACH;  Surgeon: Lovell Sheehan, MD;  Location: [facility];  Service: Orthopedics;  Laterality: Right;    There were no vitals filed for this visit.   Subjective Assessment - 03/04/21 0806     Subjective No falls stumbles, lob since previous visit. Pt presents with HEP from prior PT for her shoulder which consists of shoulder isometrics in all directions. Pt reports these are going well and reports  isometric shoulder extension has been getting pretty easy.    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 Pt reports a fall on october 29th. Pt rpeorts she hurt the R UE with the fall and she reports some stiffness in the knee as well as general weakness on the right side. Pt reports she has had 2 strokes but the first went under the radar and was only evidenced by imaging presented to her by her MD. Pt reports she has been modifying her activities in order to be careful not to fall since the stroke. Reports she has been taking her time with a lot of things. Pt reports she has a membership at [employer] and she has been walking on the treadmill 2-3 times per week, she reports she walks on the treadmill for an hour at a time following working her way up. Pt reports ocassional numbness and tingling in her hands.    Limitations House hold activities    How long can you walk comfortably? 1 hour with UE support    Patient Stated Goals improve strength and balance in the lower extremities.    Pain Onset More than a month ago  Exercise/Activity Sets/ Reps/Time/ Resistance Assistance Charge type Comments  1 foot airex 1 foot step with ball hand off  1 x 45 sec ea  cga Neuro re-ed Moderate difficulty, increased difficulty with R LE posterior   Tandem on airex with Dual task  2 round, 1 with ea LE post/ant  CGA Neuro re-ed [employer] word game for DT cognitive and motor task          LAQ  2 x 10 x 5 sec hold 5#  Therex  Moderate difficulty, more difficulty on R LE         STS - airex pad  2 x 10  cga Neuro re-ed  NO UE use this session, pt rated medium   Heel raise on 1/2 FR (round side down)  2 x 12  CGA Neuro  UE assist on all reps, unable to complete without UE assist this session.  -cues for proper positioning                      Shoulder progression to previous HEP from [place] [facility] as below.       Access Code: [email or web address] URL: [email or web address] Date: 03/04/2021 Prepared by: Rivka Barbara  Exercises Shoulder extension with resistance - Neutral - 1 x daily - 7 x weekly - 2 sets - 10 reps Standing Shoulder Row with Anchored Resistance - 1 x daily - 7 x weekly - 2 sets - 10 reps   Treatment provided this session  Pt educated throughout session about proper posture and technique with exercises. Improved exercise technique, movement at target joints, use of target muscles after min to mod verbal, visual, tactile cues.  Note: Portions of this document were prepared using Dragon voice recognition software and although reviewed may contain unintentional dictation errors in syntax, grammar, or spelling.                              PT Education - 03/04/21 0813     Education Details Updated shoulder HEP    Person(s) Educated Patient    Methods Explanation;Demonstration;Handout    Comprehension Returned demonstration;Verbalized understanding              PT Short Term Goals - 01/20/21 0915       PT SHORT TERM GOAL #1   Title Patient will be independent in home exercise program to improve strength/mobility for better functional independence with ADLs.    Baseline no HEP at this time    Time 4    Period Weeks    Status New    Target Date 02/17/21               PT Long Term Goals - 01/20/21 0915       PT LONG TERM GOAL #1   Title Patient will increase FOTO score to equal to or greater than  50   to demonstrate statistically significant improvement in mobility and quality of life.    Baseline 41 on 01/20/21    Time 8    Period Weeks    Status New    Target Date 03/17/21      PT LONG TERM GOAL #2   Title Pt will improve mini BEST test by 4 points or more in order to indicate clinically significant improvent in balance.    Baseline see flowsheets     Time 12    Period Weeks    Status New  Target Date 04/14/21      PT LONG TERM GOAL #3   Title Patient  will complete five times sit to stand test in < 15 seconds indicating an increased LE strength and improved balance.    Baseline see flowsheets    Time 12    Period Weeks    Status New    Target Date 04/14/21      PT LONG TERM GOAL #4   Title Patient will increase 10 meter walk test to >1.88m/s as to improve gait speed for better community ambulation and to reduce fall risk.    Baseline .86 m/s on 01/20/21    Time 8    Period Weeks    Status New    Target Date 03/17/21      PT LONG TERM GOAL #5   Title Pty will improve dual task TUG to less than 20 seconds in order to indicate improved cognitive and motor tasks.    Baseline 28.82 on 01/20/21    Time 12    Period Weeks    Status New    Target Date 04/14/21                   Plan - 03/04/21 0912     Clinical Impression Statement Pt presents with excellent motivation for completion of physical therapy program. Pt performed several balance and neuromuscular re-education activities in today's session and tolerated them well. Increased difficulty with several of pt's shoulder HEP exercises.  Continued with dual task interventions this session and challenged with decreased BOS and pt had continued difficyulty maintainiing balance. Pt will continue to benefit from skilled PT intervention to improve her balance, strength and reduce her risk of falls in addition to increased her confidence with everyday activities.    Personal Factors and Comorbidities Age;Comorbidity 1;Comorbidity 2    Comorbidities arthritis, anxiety, HTN, hyperlipedemia.    Examination-Activity Limitations Carry;Lift;Locomotion Level;Squat;Stairs    Examination-Participation Restrictions Cleaning;Yard Work;Meal Prep    Stability/Clinical Decision Making Stable/Uncomplicated    Rehab Potential Good    PT Frequency 2x / week    PT Duration 12 weeks    PT  Treatment/Interventions ADLs/Self Care Home Management;Gait training;Therapeutic activities;Functional mobility training;Therapeutic exercise;Balance training;Neuromuscular re-education;Manual techniques;Energy conservation;Passive range of motion    PT Next Visit Plan Continue with activity progressions, integration of dual tasks    PT Home Exercise Plan Access Code: [email or web address]  URL: [email or web address]    Consulted and Agree with Plan of Care Patient             Patient will benefit from skilled therapeutic intervention in order to improve the following deficits and impairments:  Decreased activity tolerance, Decreased strength, Decreased mobility, Decreased balance  Visit Diagnosis: Abnormality of gait and mobility  Unsteadiness on feet  Stiffness of right shoulder, not elsewhere classified     Problem List Patient Active Problem List   Diagnosis Date Noted   Acute stroke due to ischemia ([place]) 08/10/2020   History of total hip replacement, right 09/10/2019   Special screening for malignant neoplasms, colon    Benign neoplasm of ascending colon    Benign neoplasm of descending colon     Particia Lather, PT 03/04/2021, 12:44 PM  [place] MAIN [facility] SERVICES [address] [place], [name], [address] Phone: [phone]   Fax:  [phone]  Name: BRYNDLE CORREDOR MRN: [id number] Date of Birth: March 29, 1955

## 2021-03-06 NOTE — Therapy (Signed)
[place] MAIN [facility] SERVICES [address] [place], [name], [address] Phone: [phone]   Fax:  [phone]  Occupational Therapy Treatment  Patient Details  Name: Kim Gomez MRN: [id number] Date of Birth: Sep 04, 1955 Referring Provider (Kim Gomez): Joselyn Arrow   Encounter Date: 03/04/2021   Kim Gomez End of Session - 03/06/21 1126     Visit Number 5    Number of Visits 24    Date for Kim Gomez Re-Evaluation 04/25/21    Kim Gomez Start Time 0845    Kim Gomez Stop Time 0935    Kim Gomez Time Calculation (min) 50 min    Activity Tolerance Patient limited by pain    Behavior During Therapy Kim Gomez for tasks assessed/performed             Past Medical History:  Diagnosis Date   Anxiety    Arthritis    legs, hands   Cellulitis of right knee    started on antibiotics 06/21/15.     Depression    Family history of adverse reaction to anesthesia    sister and mom -PONV   Hypercholesteremia    Hypertension     Past Surgical History:  Procedure Laterality Date   ABDOMINAL HYSTERECTOMY     COLONOSCOPY WITH PROPOFOL N/A 06/27/2015   Procedure: COLONOSCOPY WITH PROPOFOL;  Surgeon: Lucilla Lame, MD;  Location: [place];  Service: Endoscopy;  Laterality: N/A;   POLYPECTOMY  06/27/2015   Procedure: POLYPECTOMY;  Surgeon: Lucilla Lame, MD;  Location: [place];  Service: Endoscopy;;   TOTAL HIP ARTHROPLASTY Right 09/10/2019   Procedure: TOTAL HIP ARTHROPLASTY ANTERIOR APPROACH;  Surgeon: Lovell Sheehan, MD;  Location: [facility];  Service: Orthopedics;  Laterality: Right;    There were no vitals filed for this visit.   Subjective Assessment - 03/06/21 1125     Subjective  Pt reports she is doing well, worked shoulder a lot in PT today.    Pertinent History Kim Gomez is a 66 y.o. female with PMH significant for HTN, HLD, anxiety disorder, recently hospitalized for COVID-19 infection (08/06/2020) at an outside facility. Patient was brought to the ED on 7/24  after she had a sudden onset of numbness involving right side of her body without any motor deficits, dysarthria, dysphagia. 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 Per chart for her f/u neuro visit:Left ischemic thalamic/internal capsule infarct presenting with right-sided numbness, weakness - now with residual cognitive fog (improving), imbalance (improving), fatigue, right first + second digit numbness, in patient with known vascular risk factors of hypertension, dyslipidemia, lack of physical activity, snoring    Patient Stated Goals Pt reports she wants to get back to doing things more independently like she was before COVID and CVA.    Currently in Pain? Yes    Pain Score 4     Pain Location Shoulder    Pain Orientation Right    Pain Descriptors / Indicators Aching;Tightness    Pain Type Chronic pain    Pain Onset More than a month ago    Pain Frequency Intermittent             Neuromuscular reeducation:  Manipulation of push pins to pick up and place in a moderate resistive board with reach encouraged towards the top of the board in sitting.  Occasional cues for prehension patterns to pick up pins.   Manipulation of small  inch pegs placed into green resistive putty to increase  finger strength and coordination to pick out and place small pegs into board.  Cues for prehension patterns to place and remove.  When removing pegs from board, pt instructed to use translatory skills of the hand to move items to the palm for storage and then move one by one back to fingertips to place into container, cues for isolated thumb movements.  Manipulation of small pieces from [employer] pegboard with emphasis on assembly of 3 small parts and then combining with reach to place items furthest away from her to work on shoulder ROM/functional reaching patterns.     Therapeutic Exercises: Grip strengthening with hand gripper on 2nd setting for  20 reps, cues for hand position on gripper.  Increased fatigue after one set.   Functional reaching tasks for tabletop tasks from seated position, rest breaks as needed and occasional guiding from therapist.     Response to tx: Pt progressing well with hand function, has been working with PT on strengthening for rotator cuff so she has been fatigued and some mild pain noted when she arrives for Kim Gomez.  Resistive grip this date with 11# of pressure for 20 reps with occasional short rest break as needed to stretch fingers into extension.  Pt responds well to cues for translatory movements of the hand as well as prehension patterns.  Continue to work towards goals in plan of care to improve UE function for daily tasks.                     Kim Gomez Education - 03/06/21 1126     Education Details HEP, coordination tasks, manipulation skills    Person(s) Educated Patient    Methods Explanation    Comprehension Verbalized understanding;Returned demonstration;Verbal cues required;Need further instruction                 Kim Gomez Long Term Goals - 01/31/21 1707       Kim Gomez LONG TERM GOAL #1   Title Patient will be independent with home exercise program    Baseline No current program    Time 12    Period Weeks    Status New    Target Date 04/25/21      Kim Gomez LONG TERM GOAL #2   Title Patient will complete basic self-care tasks with modified independence and without increased time to complete tasks    Baseline Patient currently has difficulty with select self-care tasks and requires increased time to complete    Time 6    Period Weeks    Status New    Target Date 03/14/21      Kim Gomez LONG TERM GOAL #3   Title Patient will demonstrate good safety awareness in the kitchen with cooking tasks with supervision    Baseline Decreased safety awareness at evaluation    Time 12    Period Weeks    Status New    Target Date 04/25/21      Kim Gomez LONG TERM GOAL #4   Title Patient will increase right  grip strength by 10 pounds to be able to open jars and containers with modified independence    Baseline 28 pounds at eval and difficulty with managing jars and containers    Time 12    Period Weeks    Status New    Target Date 04/25/21      Kim Gomez LONG TERM GOAL #5   Title Patient will improve right pinch strength by 5 pounds to pick up and manipulate items with modified  independence    Baseline Right lateral 5 pounds, 3 point 4#    Time 12    Period Weeks    Status New    Target Date 04/25/21      Long Term Additional Goals   Additional Long Term Goals Yes      Kim Gomez LONG TERM GOAL #6   Title Patient will decrease right shoulder pain to 2 out of 10 or less with movement patterns for daily activities.    Baseline Increased pain in right shoulder, 5 out of 10 with movement.    Time 12    Period Weeks    Status New    Target Date 04/25/21      Kim Gomez LONG TERM GOAL #7   Title Patient will demonstrate score of 59 or greater on FOTO to show a clinically relevant change to impact self-care activities with greater independence    Baseline Score of 55 at eval    Time 12    Period Weeks    Status New    Target Date 04/25/21      Kim Gomez LONG TERM GOAL #8   Title Will assess for potential return to work tasks as patient progresses with therapy    Baseline Patient currently unable to perform her job as outlined in her job description    Time [place]    Target Date 04/25/21                   Plan - 03/06/21 1127     Clinical Impression Statement Pt progressing well with hand function, has been working with PT on strengthening for rotator cuff so she has been fatigued and some mild pain noted when she arrives for Kim Gomez.  Resistive grip this date with 11# of pressure for 20 reps with occasional short rest break as needed to stretch fingers into extension.  Pt responds well to cues for translatory movements of the hand as well as prehension patterns.  Continue to work  towards goals in plan of care to improve UE function for daily tasks.    Kim Gomez Occupational Profile and History Problem Focused Assessment - Including review of records relating to presenting problem    Occupational performance deficits (Please refer to evaluation for details): ADL's;IADL's;Social Participation    Body Structure / Function / Physical Skills ADL;Dexterity;ROM;Strength;Coordination;FMC;IADL;Pain;Sensation;UE functional use;Decreased knowledge of use of DME    Cognitive Skills Memory;Safety Awareness    Psychosocial Skills Environmental  Adaptations;Habits;Routines and Behaviors    Rehab Potential Good    Clinical Decision Making Several treatment options, min-mod task modification necessary    Comorbidities Affecting Occupational Performance: May have comorbidities impacting occupational performance    Modification or Assistance to Complete Evaluation  No modification of tasks or assist necessary to complete eval    Kim Gomez Frequency 2x / week    Kim Gomez Duration 12 weeks    Kim Gomez Treatment/Interventions Self-care/ADL training;Cryotherapy;Paraffin;Therapeutic exercise;DME and/or AE instruction;Cognitive remediation/compensation;Neuromuscular education;Manual Therapy;Moist Heat;Contrast Bath;Therapeutic activities;Patient/family education    Consulted and Agree with Plan of Care Patient             Patient will benefit from skilled therapeutic intervention in order to improve the following deficits and impairments:   Body Structure / Function / Physical Skills: ADL, Dexterity, ROM, Strength, Coordination, FMC, IADL, Pain, Sensation, UE functional use, Decreased knowledge of use of DME Cognitive Skills: Memory, Safety Awareness Psychosocial Skills: Environmental  Adaptations, Habits, Routines and Behaviors  Visit Diagnosis: Muscle weakness (generalized)  Other lack of coordination  Stiffness of right shoulder, not elsewhere classified    Problem List Patient Active Problem List    Diagnosis Date Noted   Acute stroke due to ischemia ([place]) 08/10/2020   History of total hip replacement, right 09/10/2019   Special screening for malignant neoplasms, colon    Benign neoplasm of ascending colon    Benign neoplasm of descending colon    Kim Gomez, Kim Gomez, Kim Gomez  Kim Gomez, Kim Gomez 03/06/2021, 11:34 AM  [place], [name], [address] Phone: [phone]   Fax:  [phone]  Name: Kim Gomez MRN: [id number] Date of Birth: [birth year]

## 2021-03-09 ENCOUNTER — Ambulatory Visit: Payer: No Typology Code available for payment source | Admitting: Occupational Therapy

## 2021-03-09 ENCOUNTER — Other Ambulatory Visit: Payer: Self-pay

## 2021-03-09 ENCOUNTER — Encounter: Payer: Self-pay | Admitting: Occupational Therapy

## 2021-03-09 ENCOUNTER — Ambulatory Visit: Payer: No Typology Code available for payment source | Admitting: Physical Therapy

## 2021-03-09 DIAGNOSIS — M25611 Stiffness of right shoulder, not elsewhere classified: Secondary | ICD-10-CM

## 2021-03-09 DIAGNOSIS — M6281 Muscle weakness (generalized): Secondary | ICD-10-CM

## 2021-03-09 DIAGNOSIS — R269 Unspecified abnormalities of gait and mobility: Secondary | ICD-10-CM

## 2021-03-09 DIAGNOSIS — R278 Other lack of coordination: Secondary | ICD-10-CM

## 2021-03-09 DIAGNOSIS — R2681 Unsteadiness on feet: Secondary | ICD-10-CM

## 2021-03-09 NOTE — Therapy (Signed)
[place] MAIN [facility] SERVICES [address] [place], [name], [address] Phone: [phone]   Fax:  [phone]  Occupational Therapy Treatment  Patient Details  Name: Kim Gomez MRN: [id number] Date of Birth: [birth year] Referring Provider (OT): Joselyn Arrow   Encounter Date: 03/09/2021   OT End of Session - 03/09/21 0908     Visit Number 6    Number of Visits 24    Date for OT Re-Evaluation 04/25/21    OT Start Time 0845    OT Stop Time 0930    OT Time Calculation (min) 45 min    Activity Tolerance Patient limited by pain    Behavior During Therapy [facility] for tasks assessed/performed             Past Medical History:  Diagnosis Date   Anxiety    Arthritis    legs, hands   Cellulitis of right knee    started on antibiotics 06/21/15.     Depression    Family history of adverse reaction to anesthesia    sister and mom -PONV   Hypercholesteremia    Hypertension     Past Surgical History:  Procedure Laterality Date   ABDOMINAL HYSTERECTOMY     COLONOSCOPY WITH PROPOFOL N/A 06/27/2015   Procedure: COLONOSCOPY WITH PROPOFOL;  Surgeon: Lucilla Lame, MD;  Location: [place];  Service: Endoscopy;  Laterality: N/A;   POLYPECTOMY  06/27/2015   Procedure: POLYPECTOMY;  Surgeon: Lucilla Lame, MD;  Location: [place];  Service: Endoscopy;;   TOTAL HIP ARTHROPLASTY Right 09/10/2019   Procedure: TOTAL HIP ARTHROPLASTY ANTERIOR APPROACH;  Surgeon: Lovell Sheehan, MD;  Location: [facility];  Service: Orthopedics;  Laterality: Right;    There were no vitals filed for this visit.   Subjective Assessment - 03/09/21 0904     Subjective  Pt reports she had a good weekend, spent time with family.  Right arm is feeling pretty good after PT.  Discomfort in arm not described as pain but as tightness.    Pertinent History Kim Gomez is a 66 y.o. female with PMH significant for HTN, HLD, anxiety disorder, recently hospitalized for  COVID-19 infection (08/06/2020) at an outside facility. Patient was brought to the ED on 7/24 after she had a sudden onset of numbness involving right side of her body without any motor deficits, dysarthria, dysphagia. 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 Per chart for her f/u neuro visit:Left ischemic thalamic/internal capsule infarct presenting with right-sided numbness, weakness - now with residual cognitive fog (improving), imbalance (improving), fatigue, right first + second digit numbness, in patient with known vascular risk factors of hypertension, dyslipidemia, lack of physical activity, snoring    Patient Stated Goals Pt reports she wants to get back to doing things more independently like she was before COVID and CVA.    Currently in Pain? Yes    Pain Score 5     Pain Location Shoulder    Pain Orientation Right    Pain Descriptors / Indicators Tightness    Pain Type Chronic pain    Pain Onset More than a month ago    Pain Frequency Intermittent             Therapeutic Exercise: Pt seen for use of Judy board with complex pattern design, placed on wedge to encourage increased shoulder ROM. Increased reps towards top of board produced mild discomfort in the right arm.  Requires rest breaks required at times to relax right shoulder so she is not compensating. Green theraputty for grip strength, lateral pinch and 3 point pinch skills cues for proper form and technique for exercises.   Neuro: Pt instructed on turning and flipping pegs to opposite end, working on speed and dexterity of manipulating the pieces.  When removing pegs from the board, pt instructed to remove 3-4 at a time, placing into palm and then moving each item from palm to fingertip to place into bucket.    Response to tx: Pt continues to progress well, reports her right arm is feeling better and is able to use it more with daily tasks.  Reports tightness  in arm but not pain.  Coordination and manipulation skills continue to improve especially with translatory skills of the hand and using the hand for storage.  Continue to work towards goals in plan of care to improve right UE function for ADL and IADL tasks at home and in the community.                      OT Education - 03/09/21 0908     Education Details HEP, coordination tasks, manipulation skills, putty exercises    Person(s) Educated Patient    Methods Explanation    Comprehension Verbalized understanding;Returned demonstration;Verbal cues required;Need further instruction                 OT Long Term Goals - 01/31/21 1707       OT LONG TERM GOAL #1   Title Patient will be independent with home exercise program    Baseline No current program    Time 12    Period Weeks    Status New    Target Date 04/25/21      OT LONG TERM GOAL #2   Title Patient will complete basic self-care tasks with modified independence and without increased time to complete tasks    Baseline Patient currently has difficulty with select self-care tasks and requires increased time to complete    Time 6    Period Weeks    Status New    Target Date 03/14/21      OT LONG TERM GOAL #3   Title Patient will demonstrate good safety awareness in the kitchen with cooking tasks with supervision    Baseline Decreased safety awareness at evaluation    Time 12    Period Weeks    Status New    Target Date 04/25/21      OT LONG TERM GOAL #4   Title Patient will increase right grip strength by 10 pounds to be able to open jars and containers with modified independence    Baseline 28 pounds at eval and difficulty with managing jars and containers    Time 12    Period Weeks    Status New    Target Date 04/25/21      OT LONG TERM GOAL #5   Title Patient will improve right pinch strength by 5 pounds to pick up and manipulate items with modified independence    Baseline Right lateral 5  pounds, 3 point 4#    Time 12    Period Weeks    Status New    Target Date 04/25/21      Long Term Additional Goals   Additional Long Term Goals Yes      OT LONG TERM GOAL #6   Title Patient will decrease right shoulder pain to  2 out of 10 or less with movement patterns for daily activities.    Baseline Increased pain in right shoulder, 5 out of 10 with movement.    Time 12    Period Weeks    Status New    Target Date 04/25/21      OT LONG TERM GOAL #7   Title Patient will demonstrate score of 59 or greater on FOTO to show a clinically relevant change to impact self-care activities with greater independence    Baseline Score of 55 at eval    Time 12    Period Weeks    Status New    Target Date 04/25/21      OT LONG TERM GOAL #8   Title Will assess for potential return to work tasks as patient progresses with therapy    Baseline Patient currently unable to perform her job as outlined in her job description    Time [place]    Target Date 04/25/21                   Plan - 03/09/21 0909     Clinical Impression Statement Pt continues to progress well, reports her right arm is feeling better and is able to use it more with daily tasks.  Reports tightness in arm but not pain.  Coordination and manipulation skills continue to improve especially with translatory skills of the hand and using the hand for storage.  Continue to work towards goals in plan of care to improve right UE function for ADL and IADL tasks at home and in the community.    OT Occupational Profile and History Problem Focused Assessment - Including review of records relating to presenting problem    Occupational performance deficits (Please refer to evaluation for details): ADL's;IADL's;Social Participation    Body Structure / Function / Physical Skills ADL;Dexterity;ROM;Strength;Coordination;FMC;IADL;Pain;Sensation;UE functional use;Decreased knowledge of use of DME    Cognitive  Skills Memory;Safety Awareness    Psychosocial Skills Environmental  Adaptations;Habits;Routines and Behaviors    Rehab Potential Good    Clinical Decision Making Several treatment options, min-mod task modification necessary    Comorbidities Affecting Occupational Performance: May have comorbidities impacting occupational performance    Modification or Assistance to Complete Evaluation  No modification of tasks or assist necessary to complete eval    OT Frequency 2x / week    OT Duration 12 weeks    OT Treatment/Interventions Self-care/ADL training;Cryotherapy;Paraffin;Therapeutic exercise;DME and/or AE instruction;Cognitive remediation/compensation;Neuromuscular education;Manual Therapy;Moist Heat;Contrast Bath;Therapeutic activities;Patient/family education    Consulted and Agree with Plan of Care Patient             Patient will benefit from skilled therapeutic intervention in order to improve the following deficits and impairments:   Body Structure / Function / Physical Skills: ADL, Dexterity, ROM, Strength, Coordination, FMC, IADL, Pain, Sensation, UE functional use, Decreased knowledge of use of DME Cognitive Skills: Memory, Safety Awareness Psychosocial Skills: Environmental  Adaptations, Habits, Routines and Behaviors   Visit Diagnosis: Muscle weakness (generalized)  Other lack of coordination  Stiffness of right shoulder, not elsewhere classified    Problem List Patient Active Problem List   Diagnosis Date Noted   Acute stroke due to ischemia ([place]) 08/10/2020   History of total hip replacement, right 09/10/2019   Special screening for malignant neoplasms, colon    Benign neoplasm of ascending colon    Benign neoplasm of descending colon    Julio Zappia T  Eldridge Dace, CLT  Annetta South, OT 03/09/2021, 2:38 PM  [place] MAIN [facility] SERVICES [address] [place], [name], [address] Phone: [phone]   Fax:  [phone]  Name:  JAKKI DOUGHTY MRN: [id number] Date of Birth: 30-Apr-1955

## 2021-03-09 NOTE — Therapy (Signed)
[place] MAIN [facility] SERVICES [address] [place], [name], [address] Phone: [phone]   Fax:  [phone]  Physical Therapy Treatment  Patient Details  Name: Kim Gomez MRN: [id number] Date of Birth: April 22, 1955 Referring Provider (PT): Jennings Books MD   Encounter Date: 03/09/2021   PT End of Session - 03/09/21 0811     Visit Number 8    Number of Visits 24    Date for PT Re-Evaluation 04/14/21    Progress Note Due on Visit 10    PT Start Time 0808    PT Stop Time 0846    PT Time Calculation (min) 38 min    Equipment Utilized During Treatment Gait belt    Activity Tolerance Patient tolerated treatment well    Behavior During Therapy [facility] for tasks assessed/performed             Past Medical History:  Diagnosis Date   Anxiety    Arthritis    legs, hands   Cellulitis of right knee    started on antibiotics 06/21/15.     Depression    Family history of adverse reaction to anesthesia    sister and mom -PONV   Hypercholesteremia    Hypertension     Past Surgical History:  Procedure Laterality Date   ABDOMINAL HYSTERECTOMY     COLONOSCOPY WITH PROPOFOL N/A 06/27/2015   Procedure: COLONOSCOPY WITH PROPOFOL;  Surgeon: Lucilla Lame, MD;  Location: [place];  Service: Endoscopy;  Laterality: N/A;   POLYPECTOMY  06/27/2015   Procedure: POLYPECTOMY;  Surgeon: Lucilla Lame, MD;  Location: [place];  Service: Endoscopy;;   TOTAL HIP ARTHROPLASTY Right 09/10/2019   Procedure: TOTAL HIP ARTHROPLASTY ANTERIOR APPROACH;  Surgeon: Lovell Sheehan, MD;  Location: [facility];  Service: Orthopedics;  Laterality: Right;    There were no vitals filed for this visit.   Subjective Assessment - 03/09/21 0810     Subjective Pt reports no falls or stumbles sonce previous visit. Pt reports she had a good weekend.    Patient is accompained by: Family member    Pertinent History Pt had left thamalmic/interanl capsule infarct on  08/10/20 which has reasulted in right sided weakness and reports of imbalance. Pt reports a fall on october 29th. Pt rpeorts she hurt the R UE with the fall and she reports some stiffness in the knee as well as general weakness on the right side. Pt reports she has had 2 strokes but the first went under the radar and was only evidenced by imaging presented to her by her MD. Pt reports she has been modifying her activities in order to be careful not to fall since the stroke. Reports she has been taking her time with a lot of things. Pt reports she has a membership at [employer] and she has been walking on the treadmill 2-3 times per week, she reports she walks on the treadmill for an hour at a time following working her way up. Pt reports ocassional numbness and tingling in her hands.    Limitations House hold activities    How long can you walk comfortably? 1 hour with UE support    Patient Stated Goals improve strength and balance in the lower extremities.    Pain Onset More than a month ago             Exercise/Activity Sets/ Reps/Time/ Resistance Assistance Charge type Comments  1 foot on bosu 1 foot on step  ant 2 x 30 sec    Good stability, will integrate dual task with this intervention next session   Tandem on airex with Dual task  2 round, 1 with ea LE post/ant  X many minutes each  CGA Neuro re-ed White board connect the dots game for dual task interventions    Tandem walking and walking with head turns and increased speed  X 20 meters total  CGA  NMR  Moderate difficulty, monor LOB recovered with stepping strategy with tandem walking    LAQ  2 x 12 x 5 sec hold 5#  Therex  Moderate difficulty, more difficulty on R LE         STS - airex pad  2 x 10  cga Neuro re-ed  NO UE use this session, pt rated medium  To improve dynamic balance  Heel raise on 1/2 FR (round side down)  2 x 12  CGA, B UE  therex  UE assist on all reps, unable to complete without UE assist this session.  -cues  for proper positioning                            Treatment provided this session  Pt educated throughout session about proper posture and technique with exercises. Improved exercise technique, movement at target joints, use of target muscles after min to mod verbal, visual, tactile cues.  Note: Portions of this document were prepared using Dragon voice recognition software and although reviewed may contain unintentional dictation errors in syntax, grammar, or spelling.                                PT Short Term Goals - 01/20/21 0915       PT SHORT TERM GOAL #1   Title Patient will be independent in home exercise program to improve strength/mobility for better functional independence with ADLs.    Baseline no HEP at this time    Time 4    Period Weeks    Status New    Target Date 02/17/21               PT Long Term Goals - 01/20/21 0915       PT LONG TERM GOAL #1   Title Patient will increase FOTO score to equal to or greater than  50   to demonstrate statistically significant improvement in mobility and quality of life.    Baseline 41 on 01/20/21    Time 8    Period Weeks    Status New    Target Date 03/17/21      PT LONG TERM GOAL #2   Title Pt will improve mini BEST test by 4 points or more in order to indicate clinically significant improvent in balance.    Baseline see flowsheets    Time 12    Period Weeks    Status New    Target Date 04/14/21      PT LONG TERM GOAL #3   Title Patient  will complete five times sit to stand test in < 15 seconds indicating an increased LE strength and improved balance.    Baseline see flowsheets    Time 12    Period Weeks    Status New    Target Date 04/14/21      PT LONG TERM GOAL #4   Title Patient will increase 10 meter  walk test to >1.42m/s as to improve gait speed for better community ambulation and to reduce fall risk.    Baseline .86 m/s on 01/20/21    Time 8    Period Weeks    Status  New    Target Date 03/17/21      PT LONG TERM GOAL #5   Title Pty will improve dual task TUG to less than 20 seconds in order to indicate improved cognitive and motor tasks.    Baseline 28.82 on 01/20/21    Time 12    Period Weeks    Status New    Target Date 04/14/21                   Plan - 03/09/21 0929     Clinical Impression Statement Pt presents with excellent motivation for completion of physical therapy program. Pt performed several balance and neuromuscular re-education activities in today's session and tolerated them well. Continued with dual task interventions this session and challenged with decreased BOS and pt demonstrated improved ability to maintain balance with these tasks compared to previous session.. Pt will continue to benefit from skilled PT intervention to improve her balance, strength and reduce her risk of falls in addition to increased her confidence with everyday activities.    Personal Factors and Comorbidities Age;Comorbidity 1;Comorbidity 2    Comorbidities arthritis, anxiety, HTN, hyperlipedemia.    Examination-Activity Limitations Carry;Lift;Locomotion Level;Squat;Stairs    Examination-Participation Restrictions Cleaning;Yard Work;Meal Prep    Stability/Clinical Decision Making Stable/Uncomplicated    Rehab Potential Good    PT Frequency 2x / week    PT Duration 12 weeks    PT Treatment/Interventions ADLs/Self Care Home Management;Gait training;Therapeutic activities;Functional mobility training;Therapeutic exercise;Balance training;Neuromuscular re-education;Manual techniques;Energy conservation;Passive range of motion    PT Next Visit Plan Continue with activity progressions, integration of dual tasks    PT Home Exercise Plan Access Code: [email or web address]  URL: [email or web address]    Consulted and Agree with Plan of Care Patient             Patient will benefit from skilled therapeutic intervention in order to improve the following  deficits and impairments:  Decreased activity tolerance, Decreased strength, Decreased mobility, Decreased balance  Visit Diagnosis: Abnormality of gait and mobility  Unsteadiness on feet     Problem List Patient Active Problem List   Diagnosis Date Noted   Acute stroke due to ischemia ([place]) 08/10/2020   History of total hip replacement, right 09/10/2019   Special screening for malignant neoplasms, colon    Benign neoplasm of ascending colon    Benign neoplasm of descending colon     Particia Lather, PT 03/09/2021, 9:31 AM  [place] [address] [place], [name], [address] Phone: [phone]   Fax:  [phone]  Name: SOLA MARGOLIS MRN: [id number] Date of Birth: [birth year]

## 2021-03-11 ENCOUNTER — Ambulatory Visit: Payer: No Typology Code available for payment source | Admitting: Physical Therapy

## 2021-03-11 ENCOUNTER — Ambulatory Visit: Payer: No Typology Code available for payment source | Admitting: Occupational Therapy

## 2021-03-11 ENCOUNTER — Encounter: Payer: Self-pay | Admitting: Occupational Therapy

## 2021-03-11 ENCOUNTER — Other Ambulatory Visit: Payer: Self-pay

## 2021-03-11 DIAGNOSIS — M6281 Muscle weakness (generalized): Secondary | ICD-10-CM | POA: Diagnosis not present

## 2021-03-11 DIAGNOSIS — R269 Unspecified abnormalities of gait and mobility: Secondary | ICD-10-CM

## 2021-03-11 DIAGNOSIS — M25611 Stiffness of right shoulder, not elsewhere classified: Secondary | ICD-10-CM

## 2021-03-11 DIAGNOSIS — R278 Other lack of coordination: Secondary | ICD-10-CM

## 2021-03-11 NOTE — Therapy (Signed)
[place] MAIN [facility] SERVICES [address] [place], [name], [address] Phone: [phone]   Fax:  [phone]  Occupational Therapy Treatment  Patient Details  Name: Kim Gomez MRN: [id number] Date of Birth: July 09, 1955 Referring Provider (OT): Kim Gomez   Encounter Date: 03/11/2021   OT End of Session - 03/11/21 0854     Visit Number 7    Number of Visits 24    Date for OT Re-Evaluation 04/25/21    OT Start Time 0845    OT Stop Time 0930    OT Time Calculation (min) 45 min    Activity Tolerance Patient limited by pain    Behavior During Therapy [facility] for tasks assessed/performed             Past Medical History:  Diagnosis Date   Anxiety    Arthritis    legs, hands   Cellulitis of right knee    started on antibiotics 06/21/15.     Depression    Family history of adverse reaction to anesthesia    sister and mom -PONV   Hypercholesteremia    Hypertension     Past Surgical History:  Procedure Laterality Date   ABDOMINAL HYSTERECTOMY     COLONOSCOPY WITH PROPOFOL N/A 06/27/2015   Procedure: COLONOSCOPY WITH PROPOFOL;  Surgeon: Lucilla Lame, MD;  Location: [place];  Service: Endoscopy;  Laterality: N/A;   POLYPECTOMY  06/27/2015   Procedure: POLYPECTOMY;  Surgeon: Lucilla Lame, MD;  Location: [place];  Service: Endoscopy;;   TOTAL HIP ARTHROPLASTY Right 09/10/2019   Procedure: TOTAL HIP ARTHROPLASTY ANTERIOR APPROACH;  Surgeon: Lovell Sheehan, MD;  Location: [facility];  Service: Orthopedics;  Laterality: Right;    There were no vitals filed for this visit.   Subjective Assessment - 03/11/21 0852     Subjective  Pt. reports that she has been trying to do her exercises at home    Pertinent History Kim Gomez is a 66 y.o. female with PMH significant for HTN, HLD, anxiety disorder, recently hospitalized for COVID-19 infection (08/06/2020) at an outside facility. Patient was brought to the ED on 7/24  after she had a sudden onset of numbness involving right side of her body without any motor deficits, dysarthria, dysphagia. 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 Per chart for her f/u neuro visit:Left ischemic thalamic/internal capsule infarct presenting with right-sided numbness, weakness - now with residual cognitive fog (improving), imbalance (improving), fatigue, right first + second digit numbness, in patient with known vascular risk factors of hypertension, dyslipidemia, lack of physical activity, snoring    Patient Stated Goals Pt reports she wants to get back to doing things more independently like she was before COVID and CVA.    Currently in Pain? No/denies            OT TREATMENT    Neuro muscular re-education:  Pt. worked on using her right hand for grasping, and manipulating 3/4", and 1/2" washers from a magnetic dish, and storing them in the palm of her hand. Pt. Worked on translatory skills moving the washers from her palm to the tip of her 2nd digit, and thumb in preparation for placing them onto the dowels. Pt. worked on reaching up, stabilizing, and sustaining shoulder elevation while placing the washer over a small precise target on vertical dowels positioned at various angles.  Pt. worked on removing them washers one at a time. Pt. performed  [facility] tasks using the Grooved pegboard. Pt. worked on grasping the grooved pegs from a horizontal position, and moving the pegs to a vertical position, and twisting/turning them between her 2nd digit, and thumb in the hand to prepare for placing them in the proper direction in each of the grooved slots. Pt. worked on Conservation officer, nature, and placed them over a small dowel.    Pt. is making progress with her RUE. Pt. reports that she is able to reach higher, and has noticed that she is grasping better with her right hand. Pt. reports feeling a tightness, and stiffness  in her upper arm when performing [facility] tasks with the right hand. Pt. tolerated moist heat modality to her right shoulder while performing [place] tasks at the tabletop. Pt. was able to place 100% of the small resistive earring backings onto the dowel efficiently. Pt. was able to turn the grooved pegs between her fingers in preparation for placing them into the grooved pegboard slots. Pt. presented with compensation proximally with right shoulder hiking when reaching up to place the washers onto the vertical, and diagonal dowels. Pt. was able to self correct with cues to align her right shoulder for more symmetry with the left, and achieve more isolated motion when reaching. Pt. Continues to work on improving RUE ROM, strength, and [facility] skills in order to improve LUE functioning for improved ADL, and IADL performance.                             OT Education - 03/11/21 0853     Education Details [facility]    Person(s) Educated Patient    Methods Explanation    Comprehension Verbalized understanding;Returned demonstration;Verbal cues required;Need further instruction                 OT Long Term Goals - 01/31/21 1707       OT LONG TERM GOAL #1   Title Patient will be independent with home exercise program    Baseline No current program    Time 12    Period Weeks    Status New    Target Date 04/25/21      OT LONG TERM GOAL #2   Title Patient will complete basic self-care tasks with modified independence and without increased time to complete tasks    Baseline Patient currently has difficulty with select self-care tasks and requires increased time to complete    Time 6    Period Weeks    Status New    Target Date 03/14/21      OT LONG TERM GOAL #3   Title Patient will demonstrate good safety awareness in the kitchen with cooking tasks with supervision    Baseline Decreased safety awareness at evaluation    Time 12    Period Weeks    Status New    Target Date  04/25/21      OT LONG TERM GOAL #4   Title Patient will increase right grip strength by 10 pounds to be able to open jars and containers with modified independence    Baseline 28 pounds at eval and difficulty with managing jars and containers    Time 12    Period Weeks    Status New    Target Date 04/25/21      OT LONG TERM GOAL #5   Title Patient will improve right pinch strength by 5 pounds to pick up and manipulate items  with modified independence    Baseline Right lateral 5 pounds, 3 point 4#    Time 12    Period Weeks    Status New    Target Date 04/25/21      Long Term Additional Goals   Additional Long Term Goals Yes      OT LONG TERM GOAL #6   Title Patient will decrease right shoulder pain to 2 out of 10 or less with movement patterns for daily activities.    Baseline Increased pain in right shoulder, 5 out of 10 with movement.    Time 12    Period Weeks    Status New    Target Date 04/25/21      OT LONG TERM GOAL #7   Title Patient will demonstrate score of 59 or greater on FOTO to show a clinically relevant change to impact self-care activities with greater independence    Baseline Score of 55 at eval    Time 12    Period Weeks    Status New    Target Date 04/25/21      OT LONG TERM GOAL #8   Title Will assess for potential return to work tasks as patient progresses with therapy    Baseline Patient currently unable to perform her job as outlined in her job description    Time 12    Period Weeks    Status New    Target Date 04/25/21                   Plan - 03/11/21 0856     Clinical Impression Statement Pt. is making progress with her RUE. Pt. reports that she is able to reach higher, and has noticed that she is grasping better with her right hand. Pt. reports feeling a tightness, and stiffness in her upper arm when performing [facility] tasks with the right hand. Pt. tolerated moist heat modality to her right shoulder while performing [place] tasks at the  tabletop. Pt. was able to place 100% of the small resistive earring backings onto the dowel efficiently. Pt. was able to turn the grooved pegs between her fingers in preparation for placing them into the grooved pegboard slots. Pt. presented with compensation proximally with right shoulder hiking when reaching up to place the washers onto the vertical, and diagonal dowels. Pt. was able to self correct with cues to align her right shoulder for more symmetry with the left, and achieve more isolated motion when reaching. Pt. Continues to work on improving RUE ROM, strength, and [facility] skills in order to improve LUE functioning for improved ADL, and IADL performance.    OT Occupational Profile and History Problem Focused Assessment - Including review of records relating to presenting problem    Occupational performance deficits (Please refer to evaluation for details): ADL's;IADL's;Social Participation    Body Structure / Function / Physical Skills ADL;Dexterity;ROM;Strength;Coordination;FMC;IADL;Pain;Sensation;UE functional use;Decreased knowledge of use of DME    Cognitive Skills Memory;Safety Awareness    Psychosocial Skills Environmental  Adaptations;Habits;Routines and Behaviors    Rehab Potential Good    Clinical Decision Making Several treatment options, min-mod task modification necessary    Comorbidities Affecting Occupational Performance: May have comorbidities impacting occupational performance    Modification or Assistance to Complete Evaluation  No modification of tasks or assist necessary to complete eval    OT Frequency 2x / week    OT Duration 12 weeks    OT Treatment/Interventions Self-care/ADL training;Cryotherapy;Paraffin;Therapeutic exercise;DME and/or AE instruction;Cognitive remediation/compensation;Neuromuscular education;Manual  Therapy;Moist Engineer, manufacturing systems;Therapeutic activities;Patient/family education    Consulted and Agree with Plan of Care Patient             Patient  will benefit from skilled therapeutic intervention in order to improve the following deficits and impairments:   Body Structure / Function / Physical Skills: ADL, Dexterity, ROM, Strength, Coordination, FMC, IADL, Pain, Sensation, UE functional use, Decreased knowledge of use of DME Cognitive Skills: Memory, Safety Awareness Psychosocial Skills: Environmental  Adaptations, Habits, Routines and Behaviors   Visit Diagnosis: Muscle weakness (generalized)  Other lack of coordination    Problem List Patient Active Problem List   Diagnosis Date Noted   Acute stroke due to ischemia ([place]) 08/10/2020   History of total hip replacement, right 09/10/2019   Special screening for malignant neoplasms, colon    Benign neoplasm of ascending colon    Benign neoplasm of descending colon    Harrel Carina, MS, OTR/L   Harrel Carina, OT 03/11/2021, 9:00 AM  [place] [address] [place], [name], [address] Phone: [phone]   Fax:  [phone]  Name: Kim Gomez MRN: [id number] Date of Birth: March 09, 1955

## 2021-03-11 NOTE — Therapy (Signed)
[place] MAIN [facility] SERVICES [address] [place], [name], [address] Phone: [phone]   Fax:  [phone]  Physical Therapy Treatment  Patient Details  Name: Kim Gomez MRN: [id number] Date of Birth: [birth year] Referring Provider (PT): Jennings Books MD   Encounter Date: 03/11/2021   PT End of Session - 03/11/21 0859     Visit Number 9    Number of Visits 24    Date for PT Re-Evaluation 04/14/21    Progress Note Due on Visit 10    PT Start Time 0807    PT Stop Time 0845    PT Time Calculation (min) 38 min    Equipment Utilized During Treatment Gait belt    Activity Tolerance Patient tolerated treatment well    Behavior During Therapy [facility] for tasks assessed/performed             Past Medical History:  Diagnosis Date   Anxiety    Arthritis    legs, hands   Cellulitis of right knee    started on antibiotics 06/21/15.     Depression    Family history of adverse reaction to anesthesia    sister and mom -PONV   Hypercholesteremia    Hypertension     Past Surgical History:  Procedure Laterality Date   ABDOMINAL HYSTERECTOMY     COLONOSCOPY WITH PROPOFOL N/A 06/27/2015   Procedure: COLONOSCOPY WITH PROPOFOL;  Surgeon: Lucilla Lame, MD;  Location: [place];  Service: Endoscopy;  Laterality: N/A;   POLYPECTOMY  06/27/2015   Procedure: POLYPECTOMY;  Surgeon: Lucilla Lame, MD;  Location: [place];  Service: Endoscopy;;   TOTAL HIP ARTHROPLASTY Right 09/10/2019   Procedure: TOTAL HIP ARTHROPLASTY ANTERIOR APPROACH;  Surgeon: Lovell Sheehan, MD;  Location: [facility];  Service: Orthopedics;  Laterality: Right;    There were no vitals filed for this visit.   Subjective Assessment - 03/11/21 0809     Subjective Pt reports no falls or stumbles sonce previous visit.  Pt reports some soreness and achiness in her shoulder following copmleting shoulder HEP exercises last session.    Patient is accompained by: Family  member    Pertinent History Pt had left thamalmic/interanl capsule infarct on 08/10/20 which has reasulted in right sided weakness and reports of imbalance. Pt reports a fall on october 29th. Pt rpeorts she hurt the R UE with the fall and she reports some stiffness in the knee as well as general weakness on the right side. Pt reports she has had 2 strokes but the first went under the radar and was only evidenced by imaging presented to her by her MD. Pt reports she has been modifying her activities in order to be careful not to fall since the stroke. Reports she has been taking her time with a lot of things. Pt reports she has a membership at [employer] and she has been walking on the treadmill 2-3 times per week, she reports she walks on the treadmill for an hour at a time following working her way up. Pt reports ocassional numbness and tingling in her hands.    Limitations House hold activities    How long can you walk comfortably? 1 hour with UE support    Patient Stated Goals improve strength and balance in the lower extremities.    Pain Onset More than a month ago               Exercise/Activity Sets/ Reps/Time/ Resistance Assistance  Charge type Comments  1 foot airex 1 foot step with ball hand off  1 x 45 sec ea  cga Neuro re-ed Moderate difficulty, increased difficulty with R LE posterior   Manual therapy shoulder  X 15 min   Manual  Shoulder ROM with focus on ABD ROM. Gentle grade 2/3 A/P joint mobs ( Right shoulder), STM to right pectoralis muscle in localized area of tightness per pt report and per inspection by PT. Pt demonstrated improved shoulder mobility with less pain and tightness following mobilizations and STM. Added doorway stretch to HEP to address pec tighthess.         LAQ  1 x 12 x 5 sec hold 5#  Therex  Moderate difficulty, more difficulty on R LE         STS - airex pad  2 x 10  cga therex NO UE use this session, pt rated medium   Heel raise on 1/2 FR (round side  down)  1 x 12  B HH A  therex UE assist on all reps, unable to complete without UE assist this session.  -cues for proper positioning                     Shoulder progression to previous HEP from [place] [facility] as below.         Access Code: [facility] URL: [email or web address] Date: 03/11/2021 Prepared by: Rivka Barbara  Exercises Single Arm Doorway Pec Stretch at 90 Degrees Abduction - 1 x daily - 7 x weekly - 2 sets - 45 second hold                           PT Education - 03/11/21 0858     Education Details Updated shoulder HEP    Person(s) Educated Patient    Methods Explanation;Demonstration;Handout    Comprehension Verbalized understanding;Returned demonstration              PT Short Term Goals - 01/20/21 0915       PT SHORT TERM GOAL #1   Title Patient will be independent in home exercise program to improve strength/mobility for better functional independence with ADLs.    Baseline no HEP at this time    Time 4    Period Weeks    Status New    Target Date 02/17/21               PT Long Term Goals - 01/20/21 0915       PT LONG TERM GOAL #1   Title Patient will increase FOTO score to equal to or greater than  50   to demonstrate statistically significant improvement in mobility and quality of life.    Baseline 41 on 01/20/21    Time 8    Period Weeks    Status New    Target Date 03/17/21      PT LONG TERM GOAL #2   Title Pt will improve mini BEST test by 4 points or more in order to indicate clinically significant improvent in balance.    Baseline see flowsheets    Time 12    Period Weeks    Status New    Target Date 04/14/21      PT LONG TERM GOAL #3   Title Patient  will complete five times sit to stand test in < 15 seconds indicating an increased LE strength and improved balance.    Baseline see flowsheets  Time 12    Period Weeks    Status New    Target Date 04/14/21      PT LONG TERM GOAL #4    Title Patient will increase 10 meter walk test to >1.2m/s as to improve gait speed for better community ambulation and to reduce fall risk.    Baseline .86 m/s on 01/20/21    Time 8    Period Weeks    Status New    Target Date 03/17/21      PT LONG TERM GOAL #5   Title Pty will improve dual task TUG to less than 20 seconds in order to indicate improved cognitive and motor tasks.    Baseline 28.82 on 01/20/21    Time 12    Period Weeks    Status New    Target Date 04/14/21                   Plan - 03/11/21 0900     Clinical Impression Statement Pt presents with excellent motivation for copmletion of PT session. PT targeted shoulder mobility and ROM this session and pt tolerated well. Pt demonstreted improved pain and ROM following manual therapy today. Added doorway stretch to improve tightness in her pectoralis muscualture. Pt continues to show progress with her balance and stability. Will reassess goals for balance next session in progress note and address further shoulder related deficits. Pt will continue to benefit from skilled PT intervention in order to improve QOL, ROM, balance, mobility and reduce risk of falls.    Personal Factors and Comorbidities Age;Comorbidity 1;Comorbidity 2    Comorbidities arthritis, anxiety, HTN, hyperlipedemia.    Examination-Activity Limitations Carry;Lift;Locomotion Level;Squat;Stairs    Examination-Participation Restrictions Cleaning;Yard Work;Meal Prep    Stability/Clinical Decision Making Stable/Uncomplicated    Rehab Potential Good    PT Frequency 2x / week    PT Duration 12 weeks    PT Treatment/Interventions ADLs/Self Care Home Management;Gait training;Therapeutic activities;Functional mobility training;Therapeutic exercise;Balance training;Neuromuscular re-education;Manual techniques;Energy conservation;Passive range of motion    PT Next Visit Plan Continue with activity progressions, integration of dual tasks    PT Home Exercise Plan  Access Code: [email or web address]  URL: [email or web address]    Consulted and Agree with Plan of Care Patient             Patient will benefit from skilled therapeutic intervention in order to improve the following deficits and impairments:  Decreased activity tolerance, Decreased strength, Decreased mobility, Decreased balance  Visit Diagnosis: Muscle weakness (generalized)  Stiffness of right shoulder, not elsewhere classified  Abnormality of gait and mobility     Problem List Patient Active Problem List   Diagnosis Date Noted   Acute stroke due to ischemia ([place]) 08/10/2020   History of total hip replacement, right 09/10/2019   Special screening for malignant neoplasms, colon    Benign neoplasm of ascending colon    Benign neoplasm of descending colon     Particia Lather, PT 03/11/2021, 9:08 AM  [place], [name], [address] Phone: [phone]   Fax:  [phone]  Name: MARYCLAIRE STOECKER MRN: [id number] Date of Birth: 03/02/1955

## 2021-03-16 ENCOUNTER — Ambulatory Visit: Payer: No Typology Code available for payment source | Admitting: Physical Therapy

## 2021-03-16 ENCOUNTER — Encounter: Payer: Self-pay | Admitting: Occupational Therapy

## 2021-03-16 ENCOUNTER — Ambulatory Visit: Payer: No Typology Code available for payment source | Admitting: Occupational Therapy

## 2021-03-16 ENCOUNTER — Other Ambulatory Visit: Payer: Self-pay

## 2021-03-16 DIAGNOSIS — R278 Other lack of coordination: Secondary | ICD-10-CM

## 2021-03-16 DIAGNOSIS — M6281 Muscle weakness (generalized): Secondary | ICD-10-CM

## 2021-03-16 DIAGNOSIS — M25611 Stiffness of right shoulder, not elsewhere classified: Secondary | ICD-10-CM

## 2021-03-16 DIAGNOSIS — R269 Unspecified abnormalities of gait and mobility: Secondary | ICD-10-CM

## 2021-03-16 DIAGNOSIS — R2681 Unsteadiness on feet: Secondary | ICD-10-CM

## 2021-03-16 NOTE — Therapy (Signed)
[place] MAIN [facility] SERVICES [address] [place], [name], [address] Phone: [phone]   Fax:  [phone]  Occupational Therapy Treatment  Patient Details  Name: Kim Gomez MRN: [id number] Date of Birth: 1955-03-17 Referring Provider (OT): Joselyn Arrow   Encounter Date: 03/16/2021   OT End of Session - 03/16/21 0853     Visit Number 8    Number of Visits 24    Date for OT Re-Evaluation 04/25/21    OT Start Time 0846    OT Stop Time 0930    OT Time Calculation (min) 44 min    Activity Tolerance Patient limited by pain    Behavior During Therapy [facility] for tasks assessed/performed             Past Medical History:  Diagnosis Date   Anxiety    Arthritis    legs, hands   Cellulitis of right knee    started on antibiotics 06/21/15.     Depression    Family history of adverse reaction to anesthesia    sister and mom -PONV   Hypercholesteremia    Hypertension     Past Surgical History:  Procedure Laterality Date   ABDOMINAL HYSTERECTOMY     COLONOSCOPY WITH PROPOFOL N/A 06/27/2015   Procedure: COLONOSCOPY WITH PROPOFOL;  Surgeon: Lucilla Lame, MD;  Location: [place];  Service: Endoscopy;  Laterality: N/A;   POLYPECTOMY  06/27/2015   Procedure: POLYPECTOMY;  Surgeon: Lucilla Lame, MD;  Location: [place];  Service: Endoscopy;;   TOTAL HIP ARTHROPLASTY Right 09/10/2019   Procedure: TOTAL HIP ARTHROPLASTY ANTERIOR APPROACH;  Surgeon: Lovell Sheehan, MD;  Location: [facility];  Service: Orthopedics;  Laterality: Right;    There were no vitals filed for this visit.   Subjective Assessment - 03/16/21 0850     Subjective  Pt reports her arm is progressing and exercises at home going well.    Pertinent History Kim Gomez is a 66 y.o. female with PMH significant for HTN, HLD, anxiety disorder, recently hospitalized for COVID-19 infection (08/06/2020) at an outside facility. Patient was brought to the ED on 7/24  after she had a sudden onset of numbness involving right side of her body without any motor deficits, dysarthria, dysphagia. 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 Per chart for her f/u neuro visit:Left ischemic thalamic/internal capsule infarct presenting with right-sided numbness, weakness - now with residual cognitive fog (improving), imbalance (improving), fatigue, right first + second digit numbness, in patient with known vascular risk factors of hypertension, dyslipidemia, lack of physical activity, snoring    Patient Stated Goals Pt reports she wants to get back to doing things more independently like she was before COVID and CVA.    Currently in Pain? No/denies    Pain Score 0-No pain    Multiple Pain Sites No             Pt reports she has a busy day today with appointments for herself and her mom.  No pain in her shoulder at rest but when engaging in repetitive reaching tasks her pain is 5/10.   Reports difficulty and occasional pain when reaching in freezer part of refrigerator, reaching into cabinet, and opening bottle caps.    Therapeutic Exercise: Pt seen for reaching tasks with right UE with use of Magnetic easel at tabletop height with use of magnetic hooks placed at the top of the magnetic board, placing  med to large washers onto hooks, completed reps with occasional rest breaks as needed.  Activity graded down to placing hooks at mid level of easel for another round and then placed at the bottom of the easel for the last round.  Pt with less pain and reported stiffness when hooks are at the bottom of the easel.   Pt seen for AAROM at tabletop with towel under right hand, forward flexion for 15 reps, diagonal patterns with cues for changing directions.   Pt engaging in task with small pop beads to put together for finger strengthening, completed a string of 12 items, placing together and removing.  Occasional  difficulty at times with select beads, cues for prehension patterns.  Response to tx: Pt progressing well with RUE ROM, strengthening and use in daily tasks.  She continues to have some pain noted with reaching tasks.  Engaged in multi plane and level reaching while also utilizing coordination skills to complete activities.  As the demand on motion increases with shoulder flexion, her pain increases but decreases when activities are returned to waist level and tabletop levels. Continue to work towards goals to improve RUE function for tasks at home and in the community.                             OT Long Term Goals - 01/31/21 1707       OT LONG TERM GOAL #1   Title Patient will be independent with home exercise program    Baseline No current program    Time 12    Period Weeks    Status New    Target Date 04/25/21      OT LONG TERM GOAL #2   Title Patient will complete basic self-care tasks with modified independence and without increased time to complete tasks    Baseline Patient currently has difficulty with select self-care tasks and requires increased time to complete    Time 6    Period Weeks    Status New    Target Date 03/14/21      OT LONG TERM GOAL #3   Title Patient will demonstrate good safety awareness in the kitchen with cooking tasks with supervision    Baseline Decreased safety awareness at evaluation    Time 12    Period Weeks    Status New    Target Date 04/25/21      OT LONG TERM GOAL #4   Title Patient will increase right grip strength by 10 pounds to be able to open jars and containers with modified independence    Baseline 28 pounds at eval and difficulty with managing jars and containers    Time 12    Period Weeks    Status New    Target Date 04/25/21      OT LONG TERM GOAL #5   Title Patient will improve right pinch strength by 5 pounds to pick up and manipulate items with modified independence    Baseline Right lateral 5  pounds, 3 point 4#    Time 12    Period Weeks    Status New    Target Date 04/25/21      Long Term Additional Goals   Additional Long Term Goals Yes      OT LONG TERM GOAL #6   Title Patient will decrease right shoulder pain to 2 out of 10 or less with movement patterns for daily activities.  Baseline Increased pain in right shoulder, 5 out of 10 with movement.    Time 12    Period Weeks    Status New    Target Date 04/25/21      OT LONG TERM GOAL #7   Title Patient will demonstrate score of 59 or greater on FOTO to show a clinically relevant change to impact self-care activities with greater independence    Baseline Score of 55 at eval    Time 12    Period Weeks    Status New    Target Date 04/25/21      OT LONG TERM GOAL #8   Title Will assess for potential return to work tasks as patient progresses with therapy    Baseline Patient currently unable to perform her job as outlined in her job description    Time [employer] [place]    Target Date 04/25/21                   Plan - 03/16/21 0909     Clinical Impression Statement Pt progressing well with RUE ROM, strengthening and use in daily tasks.  She continues to have some pain noted with reaching tasks.  Engaged in multi plane and level reaching while also utilizing coordination skills to complete activities.  As the demand on motion increases with shoulder flexion, her pain increases but decreases when activities are returned to waist level and tabletop levels. Continue to work towards goals to improve RUE function for tasks at home and in the community.    OT Occupational Profile and History Problem Focused Assessment - Including review of records relating to presenting problem    Occupational performance deficits (Please refer to evaluation for details): ADL's;IADL's;Social Participation    Body Structure / Function / Physical Skills  ADL;Dexterity;ROM;Strength;Coordination;FMC;IADL;Pain;Sensation;UE functional use;Decreased knowledge of use of DME    Cognitive Skills Memory;Safety Awareness    Psychosocial Skills Environmental  Adaptations;Habits;Routines and Behaviors    Rehab Potential Good    Clinical Decision Making Several treatment options, min-mod task modification necessary    Comorbidities Affecting Occupational Performance: May have comorbidities impacting occupational performance    Modification or Assistance to Complete Evaluation  No modification of tasks or assist necessary to complete eval    OT Frequency 2x / week    OT Duration 12 weeks    OT Treatment/Interventions Self-care/ADL training;Cryotherapy;Paraffin;Therapeutic exercise;DME and/or AE instruction;Cognitive remediation/compensation;Neuromuscular education;Manual Therapy;Moist Heat;Contrast Bath;Therapeutic activities;Patient/family education    Consulted and Agree with Plan of Care Patient             Patient will benefit from skilled therapeutic intervention in order to improve the following deficits and impairments:   Body Structure / Function / Physical Skills: ADL, Dexterity, ROM, Strength, Coordination, FMC, IADL, Pain, Sensation, UE functional use, Decreased knowledge of use of DME Cognitive Skills: Memory, Safety Awareness Psychosocial Skills: Environmental  Adaptations, Habits, Routines and Behaviors   Visit Diagnosis: Muscle weakness (generalized)  Stiffness of right shoulder, not elsewhere classified  Other lack of coordination    Problem List Patient Active Problem List   Diagnosis Date Noted   Acute stroke due to ischemia ([place]) 08/10/2020   History of total hip replacement, right 09/10/2019   Special screening for malignant neoplasms, colon    Benign neoplasm of ascending colon    Benign neoplasm of descending colon    Ngoc Detjen T Starsha Morning, OTR/L, CLT  Keneth Borg, OT 03/17/2021, 10:01 AM  [place]  [place] MAIN [facility] SERVICES [address] [place], [name], [address] Phone: [phone]   Fax:  [phone]  Name: Kim Gomez MRN: [id number] Date of Birth: 07/06/1955

## 2021-03-16 NOTE — Therapy (Signed)
[place] MAIN [facility] SERVICES [address] [place], [name], [address] Phone: [phone]   Fax:  [phone]  Physical Therapy Treatment/Physical Therapy Progress Note   Dates of reporting period  01/20/21   to   03/16/21   Patient Details  Name: Kim Gomez MRN: [id number] Date of Birth: 1955/09/07 Referring Provider (PT): Jennings Books MD   Encounter Date: 03/16/2021   PT End of Session - 03/16/21 1105     Visit Number 10    Number of Visits 24    Date for PT Re-Evaluation 04/14/21    Progress Note Due on Visit 20    PT Start Time 0801    PT Stop Time 0843    PT Time Calculation (min) 42 min    Equipment Utilized During Treatment Gait belt    Activity Tolerance Patient tolerated treatment well    Behavior During Therapy [facility] for tasks assessed/performed             Past Medical History:  Diagnosis Date   Anxiety    Arthritis    legs, hands   Cellulitis of right knee    started on antibiotics 06/21/15.     Depression    Family history of adverse reaction to anesthesia    sister and mom -PONV   Hypercholesteremia    Hypertension     Past Surgical History:  Procedure Laterality Date   ABDOMINAL HYSTERECTOMY     COLONOSCOPY WITH PROPOFOL N/A 06/27/2015   Procedure: COLONOSCOPY WITH PROPOFOL;  Surgeon: Lucilla Lame, MD;  Location: [place];  Service: Endoscopy;  Laterality: N/A;   POLYPECTOMY  06/27/2015   Procedure: POLYPECTOMY;  Surgeon: Lucilla Lame, MD;  Location: [place];  Service: Endoscopy;;   TOTAL HIP ARTHROPLASTY Right 09/10/2019   Procedure: TOTAL HIP ARTHROPLASTY ANTERIOR APPROACH;  Surgeon: Lovell Sheehan, MD;  Location: [facility];  Service: Orthopedics;  Laterality: Right;    There were no vitals filed for this visit.   Subjective Assessment - 03/16/21 1103     Subjective Pt reports no falls or stumbles sonce previous visit.  Pt reports continued improvement in her balance. Pt now states  she only has some hesitancy with stair navigation, getting in and out of car, and with with walking up and down curbs. Pt reports HEP going well and she is completing daily.    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 Pt reports a fall on october 29th. Pt rpeorts she hurt the R UE with the fall and she reports some stiffness in the knee as well as general weakness on the right side. Pt reports she has had 2 strokes but the first went under the radar and was only evidenced by imaging presented to her by her MD. Pt reports she has been modifying her activities in order to be careful not to fall since the stroke. Reports she has been taking her time with a lot of things. Pt reports she has a membership at [employer] and she has been walking on the treadmill 2-3 times per week, she reports she walks on the treadmill for an hour at a time following working her way up. Pt reports ocassional numbness and tingling in her hands.    Limitations House hold activities    How long can you walk comfortably? 1 hour with UE  support    Patient Stated Goals improve strength and balance in the lower extremities.    Currently in Pain? No/denies    Pain Onset More than a month ago                [facility] PT Assessment - 03/16/21 0001       Mini-BESTest   Sit To Stand Normal: Comes to stand without use of hands and stabilizes independently.    Rise to Toes Normal: Stable for 3 s with maximum height.    Stand on one leg (left) Normal: 20 s.    Stand on one leg (right) Normal: 20 s.    Stand on one leg - lowest score 2    Compensatory Stepping Correction - Forward Normal: Recovers independently with a single, large step (second realignement is allowed).    Compensatory Stepping Correction - Backward Normal: Recovers independently with a single, large step     Compensatory Stepping Correction - Left Lateral Normal: Recovers independently with 1 step (crossover or lateral OK)    Compensatory Stepping Correction - Right Lateral Normal: Recovers independently with 1 step (crossover or lateral OK)    Stepping Corredtion Lateral - lowest score 2    Stance - Feet together, eyes open, firm surface  Normal: 30s    Stance - Feet together, eyes closed, foam surface  Normal: 30s    Incline - Eyes Closed Normal: Stands independently 30s and aligns with gravity    Change in Gait Speed Normal: Significantly changes walkling speed without imbalance    Walk with head turns - Horizontal Normal: performs head turns with no change in gait speed and good balance    Walk with pivot turns Normal: Turns with feet close FAST (< 3 steps) with good balance.    Step over obstacles Normal: Able to step over box with minimal change of gait speed and with good balance.    Timed UP & GO with Dual Task Moderate: Dual Task affects either counting OR walking (>10%) when compared to the TUG without Dual Task.   8.56 normal, 14.96 DT   Mini-BEST total score 27            Physical therapy treatment session today consisted of completing assessment of goals and administration of testing as demonstrated in flow sheet and goals section of this exam. Addition treatments may be found below.     Stair training.  Step through gait pattern on steps without upper extremity assist x10 on each lower extremity.  Increased difficulty with left lower extremity as propulsive lower extremity.                       PT Education - 03/16/21 1105     Education Details Progress with goals.    Person(s) Educated Patient    Methods Explanation    Comprehension Verbalized understanding              PT Short Term Goals - 03/16/21 0803       PT SHORT TERM GOAL #1   Title Patient will be independent in home exercise program to improve strength/mobility for better functional  independence with ADLs.    Baseline no HEP at this time    Time 4    Period Weeks    Status Achieved    Target Date 02/17/21               PT Long Term Goals - 03/16/21 2876  PT LONG TERM GOAL #1   Title Patient will increase FOTO score to equal to or greater than  50   to demonstrate statistically significant improvement in mobility and quality of life.    Baseline 41 on 01/20/21    Time 8    Period Weeks    Status Achieved    Target Date 03/17/21      PT LONG TERM GOAL #2   Title Pt will improve mini BEST test by 4 points or more in order to indicate clinically significant improvent in balance.    Baseline see flowsheets 27 on 2/27    Time 12    Period Weeks    Status Achieved    Target Date 04/14/21      PT LONG TERM GOAL #3   Title Patient  will complete five times sit to stand test in < 15 seconds indicating an increased LE strength and improved balance.    Baseline 13.9 sec on 2/27    Time 12    Period Weeks    Status Achieved    Target Date 04/14/21      PT LONG TERM GOAL #4   Title Patient will increase 10 meter walk test to >1.54ms as to improve gait speed for better community ambulation and to reduce fall risk.    Baseline .86 m/s on 01/20/21 2/27: 1.27 m/s    Time 8    Period Weeks    Status Achieved    Target Date 03/17/21      PT LONG TERM GOAL #5   Title Pty will improve dual task TUG to less than 20 seconds in order to indicate improved cognitive and motor tasks.    Baseline 28.82 on 01/20/21 14.98 sec on 2/27    Time 12    Period Weeks    Status Achieved    Target Date 04/14/21                   Plan - 03/16/21 03734    Clinical Impression Statement Pt proesents to PT for progress note following 10 PT visits. Pt has made excellent progress with her physical therapy for resudual deficits regatrding her balance, strength and power. Pt has met her smbulation speed goal indicating safe ability to ambulate at community speeds. Pt has  also obtained her goal with mini Best balance test indicating significant improvement in static, dynamic, anticipitory, and reactive balance strategies. Pt has met her dual task TUG goal indicating improved balance when challenged with dual task. Pt reports only balance deficit remining is stair and curb navigation and getting in and out of car.Pt is also beng seen for her right shoulder pain per referral and order in chart. Following this visit PT sessions will increase focun on the right shoulde function and use. Pt will continue to benefit from skilled PT intervention in order to improve strength, rom and shoulder function as well as to improve pt ability to navigate stairs, curbs and get in and out of her car. Patient's condition has the potential to improve in response to therapy. Maximum improvement is yet to be obtained. The anticipated improvement is attainable and reasonable in a generally predictable time.      Personal Factors and Comorbidities Age;Comorbidity 1;Comorbidity 2    Comorbidities arthritis, anxiety, HTN, hyperlipedemia.    Examination-Activity Limitations Carry;Lift;Locomotion Level;Squat;Stairs    Examination-Participation Restrictions Cleaning;Yard Work;Meal Prep    Stability/Clinical Decision Making Stable/Uncomplicated    Rehab Potential Good  PT Frequency 2x / week    PT Duration 12 weeks    PT Treatment/Interventions ADLs/Self Care Home Management;Gait training;Therapeutic activities;Functional mobility training;Therapeutic exercise;Balance training;Neuromuscular re-education;Manual techniques;Energy conservation;Passive range of motion    PT Next Visit Plan Continue with activity progressions, integration of dual tasks    PT Home Exercise Plan Access Code: [email or web address]  URL: [email or web address]    Consulted and Agree with Plan of Care Patient             Patient will benefit from skilled therapeutic intervention in order to improve the following  deficits and impairments:  Decreased activity tolerance, Decreased strength, Decreased mobility, Decreased balance  Visit Diagnosis: Muscle weakness (generalized)  Stiffness of right shoulder, not elsewhere classified  Abnormality of gait and mobility  Unsteadiness on feet     Problem List Patient Active Problem List   Diagnosis Date Noted   Acute stroke due to ischemia ([place]) 08/10/2020   History of total hip replacement, right 09/10/2019   Special screening for malignant neoplasms, colon    Benign neoplasm of ascending colon    Benign neoplasm of descending colon     Particia Lather, PT 03/16/2021, 11:28 AM  [place] [address] [place], [name], [address] Phone: [phone]   Fax:  [phone]  Name: Kim Gomez MRN: [id number] Date of Birth: Jan 11, 1956

## 2021-03-17 ENCOUNTER — Ambulatory Visit: Payer: No Typology Code available for payment source | Admitting: Speech Pathology

## 2021-03-17 DIAGNOSIS — R41841 Cognitive communication deficit: Secondary | ICD-10-CM

## 2021-03-17 DIAGNOSIS — M6281 Muscle weakness (generalized): Secondary | ICD-10-CM | POA: Diagnosis not present

## 2021-03-17 NOTE — Therapy (Signed)
[place] MAIN [facility] SERVICES [address] [place], [name], [address] Phone: [phone]   Fax:  [phone]  Speech Language Pathology Treatment  Patient Details  Name: Kim Gomez MRN: [id number] Date of Birth: Mar 26, 1955 Referring Provider (SLP): Dr. Jennings Books   Encounter Date: 03/17/2021   End of Session - 03/17/21 1612     Visit Number 11    Number of Visits 25    Date for SLP Re-Evaluation 03/24/21    Authorization - Visit Number 1    Progress Note Due on Visit 10    SLP Start Time 0900    SLP Stop Time  1000    SLP Time Calculation (min) 60 min    Activity Tolerance Patient tolerated treatment well             Past Medical History:  Diagnosis Date   Anxiety    Arthritis    legs, hands   Cellulitis of right knee    started on antibiotics 06/21/15.     Depression    Family history of adverse reaction to anesthesia    sister and mom -PONV   Hypercholesteremia    Hypertension     Past Surgical History:  Procedure Laterality Date   ABDOMINAL HYSTERECTOMY     COLONOSCOPY WITH PROPOFOL N/A 06/27/2015   Procedure: COLONOSCOPY WITH PROPOFOL;  Surgeon: Kim Lame, MD;  Location: [place];  Service: Endoscopy;  Laterality: N/A;   POLYPECTOMY  06/27/2015   Procedure: POLYPECTOMY;  Surgeon: Kim Lame, MD;  Location: [place];  Service: Endoscopy;;   TOTAL HIP ARTHROPLASTY Right 09/10/2019   Procedure: TOTAL HIP ARTHROPLASTY ANTERIOR APPROACH;  Surgeon: Kim Sheehan, MD;  Location: [facility];  Service: Orthopedics;  Laterality: Right;    There were no vitals filed for this visit.   Subjective Assessment - 03/17/21 1609     Subjective "I'm just taking it day by day"    Currently in Pain? No/denies   Reports sore throat, but denies pain   Pain Score 0-No pain    Pain Onset More than a month ago                   ADULT SLP TREATMENT - 03/17/21 1615       General Information    Behavior/Cognition Alert;Cooperative;Pleasant mood    HPI Patient is a 66 y.o. female who admitted 08/10/20 and found to have Covid-19 as well as left ischemic thalamic/internal capsule infarct. Acute SLP screened and s/o; pt now with residual cognitive fog, imbalance, fatigue, right first + second digit numbness.      Treatment Provided   Treatment provided Cognitive-Linquistic      Cognitive-Linquistic Treatment   Treatment focused on Cognition;Patient/family/caregiver education    Skilled Treatment Pt voice hoarse and strained today. Pt reports the hoarseness is intermittent and has been this way since she got sick ~Jan 17th. She reports that she was coughing all last week.  With min cue, pt got her planner out and recalled events from the past 2-3 weeks. With mod I, pt entered upcoming appointments (time and type) into her calendar. Pt entered appointment information with 88% accuracy, improved to 100% accuracy with min question cues to double check. While entering appointments, pt realized she had 2 conflicting appointments scheduled. With min assist pt wrote a reminder to ask to reschedule one of those appointments. Pt reports that while she was out sick, she did not do much. Discussed her  wanting to start activities that she was doing before she got sick. Facilititated generating a list of these activities and discussed her focusing on 1 activity to incorporate each week. Educated on gradually getting back to these things and managing her energy. With min-mod cues she was able to prioritize which activity to focus on now vs in the upcoming weeks. Provided pt with a modified habit tracker to keep track of PT exercises and personal goals (ie walking every other day, cooking 2 days a week). Pt reports that memory and attention strategies have been helping (ie staying in the kitchen while cooking, putting her purse near the front door.) Pt reports upcoming cataract surgery in March. Discussed option of  modifying appointment schedules and activities as needed.      Assessment / Recommendations / Plan   Plan Continue with current plan of care      Progression Toward Goals   Progression toward goals Progressing toward goals              SLP Education - 03/17/21 1707     Education Details Managing her energy and memory strategy (habit tracker)    Person(s) Educated Patient    Methods Explanation    Comprehension Verbalized understanding              SLP Short Term Goals - 02/24/21 1129       SLP SHORT TERM GOAL #1   Title Pt will verbalize and demonstrate how to implement 2 memory and attention strategies to aid daily functioning given occasional min A over 2 sessions    Status Partially Met      SLP SHORT TERM GOAL #2   Title Pt will manage finances with use of compensations with no reported episodes of missed bills given rare min A    Status On-going      SLP SHORT TERM GOAL #3   Title Pt will identify errors in writing and self-correct with 80% accuracy given rare min A over 2 sessions    Status Partially Met      SLP SHORT TERM GOAL #4   Title Pt will participate in further language assessment as needed (goals to be added)    Status On-going              SLP Long Term Goals - 02/24/21 1130       SLP LONG TERM GOAL #1   Title Pt will verbalize and implement 4 attention and memory strategies to aid daily functioning for ADLs and IADLs with rare min A over 2 sessions    Status On-going      SLP LONG TERM GOAL #2   Title Pt will identify and correct paraphasias in conversation and writing with 90% accuracy given rare min A over 2 sessions    Status On-going      SLP LONG TERM GOAL #3   Title Pt will use compensations for thought organization effectively in 20 minutes mod complex conversation.    Status Revised              Plan - 03/17/21 1644     Clinical Impression Statement Patient presents with mild cognitive communication impairment. Pt  presents with new onset of hoarseness and vocal strain. Pt reports onset at beginning of sickness and persistent coughing last week. Will continue to monitor. Recommend ENT referral if no improvement within upcoming sessions.. Pt continues to use calendar and reports strategies are helping for ADLs. Pt aware of activities that  may be demanding for her. Pt benefited from assistance with prioritizing these activities and slowly reincorporating activities that may be cognitively demanding. I recommend skilled ST to maximize pt's cognitive communication and language skills to improve accuracy/independence with ADLs and IADLs, reduce communication breakdowns, and improve quality of life.    Speech Therapy Frequency 2x / week    Duration 12 weeks    Treatment/Interventions Language facilitation;Environmental controls;SLP instruction and feedback;Cognitive reorganization;Compensatory strategies;Patient/family education;Internal/external aids    Potential to Achieve Goals Good    SLP Home Exercise Plan see pt instructions    Consulted and Agree with Plan of Care Patient             Patient will benefit from skilled therapeutic intervention in order to improve the following deficits and impairments:   Cognitive communication deficit    Problem List Patient Active Problem List   Diagnosis Date Noted   Acute stroke due to ischemia ([place]) 08/10/2020   History of total hip replacement, right 09/10/2019   Special screening for malignant neoplasms, colon    Benign neoplasm of ascending colon    Benign neoplasm of descending colon    Mickel Fuchs [place], [place] 03/17/2021, 5:21 PM  [place] [address] [place], [name], [address] Phone: [phone]   Fax:  [phone]   Name: COURTNY BENNISON MRN: [id number] Date of Birth: August 28, 1955

## 2021-03-18 ENCOUNTER — Other Ambulatory Visit: Payer: Self-pay

## 2021-03-18 ENCOUNTER — Ambulatory Visit: Payer: No Typology Code available for payment source | Attending: Neurology | Admitting: Occupational Therapy

## 2021-03-18 ENCOUNTER — Encounter: Payer: Self-pay | Admitting: Occupational Therapy

## 2021-03-18 DIAGNOSIS — M25511 Pain in right shoulder: Secondary | ICD-10-CM | POA: Insufficient documentation

## 2021-03-18 DIAGNOSIS — M25611 Stiffness of right shoulder, not elsewhere classified: Secondary | ICD-10-CM | POA: Insufficient documentation

## 2021-03-18 DIAGNOSIS — R41841 Cognitive communication deficit: Secondary | ICD-10-CM | POA: Diagnosis present

## 2021-03-18 DIAGNOSIS — R278 Other lack of coordination: Secondary | ICD-10-CM | POA: Diagnosis present

## 2021-03-18 DIAGNOSIS — M6281 Muscle weakness (generalized): Secondary | ICD-10-CM | POA: Insufficient documentation

## 2021-03-18 DIAGNOSIS — G8929 Other chronic pain: Secondary | ICD-10-CM | POA: Insufficient documentation

## 2021-03-18 NOTE — Therapy (Signed)
[place] MAIN [facility] SERVICES [address] [place], [name], [address] Phone: [phone]   Fax:  [phone]  Occupational Therapy Treatment  Patient Details  Name: SHAIANN MCMANAMON MRN: [id number] Date of Birth: 03-18-1955 Referring Provider (OT): Joselyn Arrow   Encounter Date: 03/18/2021   OT End of Session - 03/20/21 1300     Visit Number 9    Number of Visits 24    Date for OT Re-Evaluation 04/25/21    OT Start Time 0845    OT Stop Time 0930    OT Time Calculation (min) 45 min    Activity Tolerance Patient limited by pain    Behavior During Therapy [facility] for tasks assessed/performed             Past Medical History:  Diagnosis Date   Anxiety    Arthritis    legs, hands   Cellulitis of right knee    started on antibiotics 06/21/15.     Depression    Family history of adverse reaction to anesthesia    sister and mom -PONV   Hypercholesteremia    Hypertension     Past Surgical History:  Procedure Laterality Date   ABDOMINAL HYSTERECTOMY     COLONOSCOPY WITH PROPOFOL N/A 06/27/2015   Procedure: COLONOSCOPY WITH PROPOFOL;  Surgeon: Lucilla Lame, MD;  Location: [place];  Service: Endoscopy;  Laterality: N/A;   POLYPECTOMY  06/27/2015   Procedure: POLYPECTOMY;  Surgeon: Lucilla Lame, MD;  Location: [place];  Service: Endoscopy;;   TOTAL HIP ARTHROPLASTY Right 09/10/2019   Procedure: TOTAL HIP ARTHROPLASTY ANTERIOR APPROACH;  Surgeon: Lovell Sheehan, MD;  Location: [facility];  Service: Orthopedics;  Laterality: Right;    There were no vitals filed for this visit.   Subjective Assessment - 03/20/21 1259     Subjective  Pt reports 4/10 pain in right arm this date, doing well otherwise.    Pertinent History FELICITAS SINE is a 66 y.o. female with PMH significant for HTN, HLD, anxiety disorder, recently hospitalized for COVID-19 infection (08/06/2020) at an outside facility. Patient was brought to the ED on 7/24  after she had a sudden onset of numbness involving right side of her body without any motor deficits, dysarthria, dysphagia. 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 Per chart for her f/u neuro visit:Left ischemic thalamic/internal capsule infarct presenting with right-sided numbness, weakness - now with residual cognitive fog (improving), imbalance (improving), fatigue, right first + second digit numbness, in patient with known vascular risk factors of hypertension, dyslipidemia, lack of physical activity, snoring    Patient Stated Goals Pt reports she wants to get back to doing things more independently like she was before COVID and CVA.    Currently in Pain? No/denies    Pain Score 0-No pain    Pain Onset More than a month ago            Therapeutic Exercise:  Pt seen for strengthening and ROM with use of UBE performed in sitting, therapist in constant attendance to ensure grip and to adjust settings for alternating levels of resistance and cues for changing directions. Completed 8 mins total.   Neuromuscular Reed: Handwriting on dry erase board with forming a list of 5 items with 4 columns total, encouraging reach towards upper part of the board.    Purdue pegboard from seated position with assembling small pieces with small dowel, collar and washer with  right hand.  When disassembling, pt moving items through the hand to the palm, using hand for storage and then moving each piece back to finger tips to sort in respective cups.  Dropping items frequently with smaller pieces.    Response to tx: Pt continues to progress towards goals, her right shoulder is improving with reach but she still requires frequent rest/stretch breaks during tasks.  Handwriting with good legibility on board but strains reach at times.  Pt dropping items frequently when less than 1/2 inch in size.  Pt responds well to cues for prehension patterns.  Continue to work towards goals in plan of care to maximize safety and independence in daily tasks.                         OT Education - 03/20/21 1300     Education Details [place], reaching tasks    Person(s) Educated Patient    Methods Explanation    Comprehension Verbalized understanding;Returned demonstration;Verbal cues required;Need further instruction                 OT Long Term Goals - 01/31/21 1707       OT LONG TERM GOAL #1   Title Patient will be independent with home exercise program    Baseline No current program    Time 12    Period Weeks    Status New    Target Date 04/25/21      OT LONG TERM GOAL #2   Title Patient will complete basic self-care tasks with modified independence and without increased time to complete tasks    Baseline Patient currently has difficulty with select self-care tasks and requires increased time to complete    Time 6    Period Weeks    Status New    Target Date 03/14/21      OT LONG TERM GOAL #3   Title Patient will demonstrate good safety awareness in the kitchen with cooking tasks with supervision    Baseline Decreased safety awareness at evaluation    Time 12    Period Weeks    Status New    Target Date 04/25/21      OT LONG TERM GOAL #4   Title Patient will increase right grip strength by 10 pounds to be able to open jars and containers with modified independence    Baseline 28 pounds at eval and difficulty with managing jars and containers    Time 12    Period Weeks    Status New    Target Date 04/25/21      OT LONG TERM GOAL #5   Title Patient will improve right pinch strength by 5 pounds to pick up and manipulate items with modified independence    Baseline Right lateral 5 pounds, 3 point 4#    Time 12    Period Weeks    Status New    Target Date 04/25/21      Long Term Additional Goals   Additional Long Term Goals Yes      OT LONG TERM GOAL #6   Title Patient will decrease right  shoulder pain to 2 out of 10 or less with movement patterns for daily activities.    Baseline Increased pain in right shoulder, 5 out of 10 with movement.    Time 12    Period Weeks    Status New    Target Date 04/25/21      OT LONG  TERM GOAL #7   Title Patient will demonstrate score of 59 or greater on FOTO to show a clinically relevant change to impact self-care activities with greater independence    Baseline Score of 55 at eval    Time 12    Period Weeks    Status New    Target Date 04/25/21      OT LONG TERM GOAL #8   Title Will assess for potential return to work tasks as patient progresses with therapy    Baseline Patient currently unable to perform her job as outlined in her job description    Time [place]    Target Date 04/25/21                   Plan - 03/20/21 1300     Clinical Impression Statement Pt progressing well with RUE ROM, strengthening and use in daily tasks.  She continues to have some pain noted with reaching tasks.  Engaged in multi plane and level reaching while also utilizing coordination skills to complete activities.  As the demand on motion increases with shoulder flexion, her pain increases but decreases when activities are returned to waist level and tabletop levels. Continue to work towards goals to improve RUE function for tasks at home and in the community.    OT Occupational Profile and History Problem Focused Assessment - Including review of records relating to presenting problem    Occupational performance deficits (Please refer to evaluation for details): ADL's;IADL's;Social Participation    Body Structure / Function / Physical Skills ADL;Dexterity;ROM;Strength;Coordination;FMC;IADL;Pain;Sensation;UE functional use;Decreased knowledge of use of DME    Cognitive Skills Memory;Safety Awareness    Psychosocial Skills Environmental  Adaptations;Habits;Routines and Behaviors    Rehab Potential Good    Clinical  Decision Making Several treatment options, min-mod task modification necessary    Comorbidities Affecting Occupational Performance: May have comorbidities impacting occupational performance    Modification or Assistance to Complete Evaluation  No modification of tasks or assist necessary to complete eval    OT Frequency 2x / week    OT Duration 12 weeks    OT Treatment/Interventions Self-care/ADL training;Cryotherapy;Paraffin;Therapeutic exercise;DME and/or AE instruction;Cognitive remediation/compensation;Neuromuscular education;Manual Therapy;Moist Heat;Contrast Bath;Therapeutic activities;Patient/family education    Consulted and Agree with Plan of Care Patient             Patient will benefit from skilled therapeutic intervention in order to improve the following deficits and impairments:   Body Structure / Function / Physical Skills: ADL, Dexterity, ROM, Strength, Coordination, FMC, IADL, Pain, Sensation, UE functional use, Decreased knowledge of use of DME Cognitive Skills: Memory, Safety Awareness Psychosocial Skills: Environmental  Adaptations, Habits, Routines and Behaviors   Visit Diagnosis: Muscle weakness (generalized)  Stiffness of right shoulder, not elsewhere classified  Other lack of coordination    Problem List Patient Active Problem List   Diagnosis Date Noted   Acute stroke due to ischemia ([place]) 08/10/2020   History of total hip replacement, right 09/10/2019   Special screening for malignant neoplasms, colon    Benign neoplasm of ascending colon    Benign neoplasm of descending colon    Rasheem Figiel Oneita Jolly, OTR/L, CLT  Arnette Driggs, OT 03/20/2021, 1:08 PM  [facility] [address] [place], [name], [address] Phone: [phone]   Fax:  [phone]  Name: KARRYN KOSINSKI MRN: [id number] Date of Birth: 1955-11-27

## 2021-03-19 ENCOUNTER — Ambulatory Visit: Payer: No Typology Code available for payment source | Admitting: Speech Pathology

## 2021-03-19 DIAGNOSIS — R41841 Cognitive communication deficit: Secondary | ICD-10-CM

## 2021-03-19 DIAGNOSIS — M6281 Muscle weakness (generalized): Secondary | ICD-10-CM | POA: Diagnosis not present

## 2021-03-19 NOTE — Therapy (Signed)
[facility] ?[place], [name], [address] ?Phone: [phone]   Fax:  [phone] ? ?Speech Language Pathology Treatment ? ?Patient Details  ?Name: Kim Gomez ?MRN: [id number] ?Date of Birth: 12-05-1955 ?Referring Provider (SLP): Dr. Jennings Books ? ? ?Encounter Date: 03/19/2021 ? ? End of Session - 03/19/21 1533   ? ? Visit Number 12   ? Number of Visits 25   ? Date for SLP Re-Evaluation 03/24/21   ? Authorization - Visit Number 2   ? Progress Note Due on Visit 10   ? SLP Start Time 1004   ? SLP Stop Time  1100   ? SLP Time Calculation (min) 56 min   ? Activity Tolerance Patient tolerated treatment well   ? ?  ?  ? ?  ? ? ?Past Medical History:  ?Diagnosis Date  ? Anxiety   ? Arthritis   ? legs, hands  ? Cellulitis of right knee   ? started on antibiotics 06/21/15.    ? Depression   ? Family history of adverse reaction to anesthesia   ? sister and mom -PONV  ? Hypercholesteremia   ? Hypertension   ? ? ?Past Surgical History:  ?Procedure Laterality Date  ? ABDOMINAL HYSTERECTOMY    ? COLONOSCOPY WITH PROPOFOL N/A 06/27/2015  ? Procedure: COLONOSCOPY WITH PROPOFOL;  Surgeon: Lucilla Lame, MD;  Location: [facility];  Service: Endoscopy;  Laterality: N/A;  ? POLYPECTOMY  06/27/2015  ? Procedure: POLYPECTOMY;  Surgeon: Lucilla Lame, MD;  Location: [facility];  Service: Endoscopy;;  ? TOTAL HIP ARTHROPLASTY Right 09/10/2019  ? Procedure: TOTAL HIP ARTHROPLASTY ANTERIOR APPROACH;  Surgeon: Lovell Sheehan, MD;  Location: [facility];  Service: Orthopedics;  Laterality: Right;  ? ? ?There were no vitals filed for this visit. ? ? Subjective Assessment - 03/19/21 1238   ? ? Subjective "I'm resting this weekend"   ? Currently in Pain? No/denies   ? Pain Score 0-No pain   ? ?  ?  ? ?  ? ? ? ? ? ? ? ? ADULT SLP TREATMENT - 03/19/21 1239   ? ?  ? General Information  ? Behavior/Cognition Alert;Cooperative;Pleasant mood   ? HPI Patient is a 66 y.o.  female who admitted 08/10/20 and found to have Covid-19 as well as left ischemic thalamic/internal capsule infarct. Acute SLP screened and s/o; pt now with residual cognitive fog, imbalance, fatigue, right first + second digit numbness.   ?  ? Treatment Provided  ? Treatment provided Cognitive-Linquistic   ?  ? Cognitive-Linquistic Treatment  ? Treatment focused on Cognition;Patient/family/caregiver education   ? Skilled Treatment Pt independently got habit tracker out to review. Pt checked off 2 days for personal goal of walking. Pt reports it was helpful to check them off. Pt independently got calendar out. Pt recalled today's date with min question cue. Reviewed patient's week and upcoming weekend. Pt used calendar to recall 3 previous events from this week/last week with min cues. With mod I, pt wrote down reminders to go walking on Friday, and confirm plans for Saturday.  Pt discussed wanting to cook something next week. Facilitated planning a recipe to cook and choosing a day to grocery shop. With min assist, pt created a grocery list from a recipe she wrote down in a previous session. During this task, with min-mod cues pt recalled memory/attention strategies to use while cooking, including creating a checklist, using a timer, getting all  the ingredients out first, and staying in the kitchen. Targeted alternating attention during grocery budgeting task. Pt utilized Ipad to look up items on [employer] Pt wrote down prices of items with 100% acc with min verbal cues. During task, pt made 2 spelling errors and independently self-corrected. Pt benefited from cues to double check for correct items and to cross unwanted items off list.   ?  ? Assessment / Recommendations / Plan  ? Plan Continue with current plan of care   ?  ? Progression Toward Goals  ? Progression toward goals Progressing toward goals   ? ?  ?  ? ?  ? ? ? SLP Education - 03/19/21 1528   ? ? Education Details Using memory and attention  strategies while cooking   ? Person(s) Educated Patient   ? Methods Explanation   ? Comprehension Verbalized understanding   ? ?  ?  ? ?  ? ? ? SLP Short Term Goals - 02/24/21 1129   ? ?  ? SLP SHORT TERM GOAL #1  ? Title Pt will verbalize and demonstrate how to implement 2 memory and attention strategies to aid daily functioning given occasional min A over 2 sessions   ? Status Partially Met   ?  ? SLP SHORT TERM GOAL #2  ? Title Pt will manage finances with use of compensations with no reported episodes of missed bills given rare min A   ? Status On-going   ?  ? SLP SHORT TERM GOAL #3  ? Title Pt will identify errors in writing and self-correct with 80% accuracy given rare min A over 2 sessions   ? Status Partially Met   ?  ? SLP SHORT TERM GOAL #4  ? Title Pt will participate in further language assessment as needed (goals to be added)   ? Status On-going   ? ?  ?  ? ?  ? ? ? SLP Long Term Goals - 02/24/21 1130   ? ?  ? SLP LONG TERM GOAL #1  ? Title Pt will verbalize and implement 4 attention and memory strategies to aid daily functioning for ADLs and IADLs with rare min A over 2 sessions   ? Status On-going   ?  ? SLP LONG TERM GOAL #2  ? Title Pt will identify and correct paraphasias in conversation and writing with 90% accuracy given rare min A over 2 sessions   ? Status On-going   ?  ? SLP LONG TERM GOAL #3  ? Title Pt will use compensations for thought organization effectively in 20 minutes mod complex conversation.   ? Status Revised   ? ?  ?  ? ?  ? ? ? Plan - 03/19/21 1309   ? ? Clinical Impression Statement Patient presents with mild cognitive communication impairment. Pt voice less hoarse than previous session. Will continue to monitor. Pt demonstrating increased error awareness during tasks. Pt is implementing rest days to help maximize energy. Pt reports using memory and attention strategies to help with ADLs. Pt continues to benefit from assistance with planning and prioritizing tasks that are  cognitively demanding. I recommend skilled ST to maximize pt's cognitive communication and language skills to improve accuracy/independence with ADLs and IADLs, reduce communication breakdowns, and improve quality of life.   ? Speech Therapy Frequency 2x / week   ? Duration 12 weeks   ? Treatment/Interventions Language facilitation;Environmental controls;SLP instruction and feedback;Cognitive reorganization;Compensatory strategies;Patient/family education;Internal/external aids   ? Potential to Achieve  Goals Good   ? SLP Home Exercise Plan see pt instructions   ? Consulted and Agree with Plan of Care Patient   ? ?  ?  ? ?  ? ? ?Patient will benefit from skilled therapeutic intervention in order to improve the following deficits and impairments:   ?Cognitive communication deficit ? ? ? ?Problem List ?Patient Active Problem List  ? Diagnosis Date Noted  ? Acute stroke due to ischemia ([facility]) 08/10/2020  ? 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 Special screening for malignant neoplasms, colon   ? Benign neoplasm of ascending colon   ? Benign neoplasm of descending colon   ?Caryl Comes Smith-Sneed SLP Student   ? ?[place] Smith-Sneed, [place] ?03/19/2021, 3:34 PM ? ?[facility] ?[place], [name], [address] ?Phone: [phone]   Fax:  [phone] ? ? ?Name: Kim Gomez ?MRN: [id number] ?Date of Birth: 1955-02-09 ? ?

## 2021-03-23 ENCOUNTER — Ambulatory Visit: Payer: No Typology Code available for payment source | Admitting: Physical Therapy

## 2021-03-23 ENCOUNTER — Other Ambulatory Visit: Payer: Self-pay

## 2021-03-23 ENCOUNTER — Ambulatory Visit: Payer: No Typology Code available for payment source | Admitting: Speech Pathology

## 2021-03-23 ENCOUNTER — Ambulatory Visit: Payer: No Typology Code available for payment source | Admitting: Occupational Therapy

## 2021-03-23 ENCOUNTER — Encounter: Payer: Self-pay | Admitting: Occupational Therapy

## 2021-03-23 DIAGNOSIS — M25611 Stiffness of right shoulder, not elsewhere classified: Secondary | ICD-10-CM

## 2021-03-23 DIAGNOSIS — M25511 Pain in right shoulder: Secondary | ICD-10-CM

## 2021-03-23 DIAGNOSIS — M6281 Muscle weakness (generalized): Secondary | ICD-10-CM

## 2021-03-23 DIAGNOSIS — R41841 Cognitive communication deficit: Secondary | ICD-10-CM

## 2021-03-23 DIAGNOSIS — R278 Other lack of coordination: Secondary | ICD-10-CM

## 2021-03-23 DIAGNOSIS — G8929 Other chronic pain: Secondary | ICD-10-CM

## 2021-03-23 NOTE — Therapy (Signed)
[facility] °[facility] °[address] °[place], [place], [address] °Phone: [phone]   Fax:  [phone] ° °Physical Therapy Re- Eval / Certification note  ° °Patient Details  °Name: Kim Gomez °MRN: [id number] °Date of Birth: 11/26/1955 °Referring Provider (PT): Shah, Hemang MD, Patel, Sunny Harish, MD  ° ° °Encounter Date: 03/23/2021 ° ° PT End of Session - 03/23/21 1522   ° ° Visit Number 11   ° Number of Visits 24   ° Date for PT Re-Evaluation 05/18/21   ° Progress Note Due on Visit 20   ° PT Start Time 1519   ° PT Stop Time 1559   ° PT Time Calculation (min) 40 min   ° Activity Tolerance Patient limited by pain;Patient tolerated treatment well   ° Behavior During Therapy WFL for tasks assessed/performed   ° °  °  ° °  ° ° °Past Medical History:  °Diagnosis Date  ° Anxiety   ° Arthritis   ° legs, hands  ° Cellulitis of right knee   ° started on antibiotics 06/21/15.    ° Depression   ° Family history of adverse reaction to anesthesia   ° sister and mom -PONV  ° Hypercholesteremia   ° Hypertension   ° ° °Past Surgical History:  °Procedure Laterality Date  ° ABDOMINAL HYSTERECTOMY    ° COLONOSCOPY WITH PROPOFOL N/A 06/27/2015  ° Procedure: COLONOSCOPY WITH PROPOFOL;  Surgeon: Darren Wohl, MD;  Location: [facility];  Service: Endoscopy;  Laterality: N/A;  ° POLYPECTOMY  06/27/2015  ° Procedure: POLYPECTOMY;  Surgeon: Darren Wohl, MD;  Location: [facility];  Service: Endoscopy;;  ° TOTAL HIP ARTHROPLASTY Right 09/10/2019  ° Procedure: TOTAL HIP ARTHROPLASTY ANTERIOR APPROACH;  Surgeon: Bowers, James R, MD;  Location: [facility];  Service: Orthopedics;  Laterality: Right;  ° ° °There were no vitals filed for this visit. ° ° Subjective Assessment - 03/23/21 1521   ° ° Subjective Pt reports a shoulder pain level 8/10 this morning prior to taking meloxicam and completing some earlier prescribed PT exercises.   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 Pt reports a fall on october 29th. Pt rpeorts she hurt the R UE with the fall and she reports some stiffness in the knee as well as general weakness on the right side. Pt reports she has had 2 strokes but the first went under the radar and was only evidenced by imaging presented to her by her MD. Pt reports she has been modifying her activities in order to be careful not to fall since the stroke. Reports she has been taking her time with a lot of things. Pt reports she has a membership at [employer] and she has been walking on the treadmill 2-3 times per week, she reports she walks on the treadmill for an hour at a time following working her way up. Pt reports ocassional numbness and tingling in her hands.   ° Limitations House hold activities   ° How long can you walk comfortably? 1 hour with UE support   ° Patient Stated Goals Improve her shoulder pain and function on the right side   ° Currently in Pain? No/denies   Patient took pain relieving medication but she reports pain of 8 of 10 earlier this morning  ° Pain Location   Shoulder   ° Pain Orientation Right   ° Pain Onset More than a month ago   ° Aggravating Factors  Right shoulder movements   ° °  °  ° °  ° ° ° ° ° °Exercise/Activity Sets/Reps/Time/ Resistance Assistance Charge type Comments  °R Shoulder AROM  °    Flex: 90 ( more scaption)  °ABD: 85 °ER: 25  °R Shoulder strength testing    Flex: 3-/5 °ABD: 3-/5 °ER: 3+ °IR: 4/5  °L shoulder strength and ROM     Within normal limits  °R shoulder PROM     Flex 120 pre stretch and STM  °ABD 123 °ER: 50 @ 45 degrees ABD °IR: WNL   °Quick DASH     50% disability   °Shoulder PROM and STM  X 10 min   Manual  Gentle stretching with end range hold to pt tolerance in flexion, ER, and abduction. Improved tolerance for range of motion following.   °      °      °      °       °      °Treatment Provided this session  ° °Pt educated throughout session about proper posture and technique with exercises. Improved exercise technique, movement at target joints, use of target muscles after min to mod verbal, visual, tactile cues. °Note: Portions of this document were prepared using Dragon voice recognition software and although reviewed may contain unintentional dictation errors in syntax, grammar, or spelling. ° ° ° ° ° ° ° ° ° ° ° ° ° ° ° ° ° ° ° ° ° ° ° ° PT Education - 03/23/21 1522   ° ° Education Details Shoulder POC and goals.   ° Person(s) Educated Patient   ° Methods Explanation   ° Comprehension Verbalized understanding   ° °  °  ° °  ° ° ° PT Short Term Goals - 03/23/21 1656   ° °  ° PT SHORT TERM GOAL #1  ° Title Patient will be independent in home exercise program to improve strength/mobility for better functional independence with ADLs.   ° Baseline no HEP at this time   ° Time 4   ° Period Weeks   ° Status Achieved   ° Target Date 02/17/21   °  ° PT SHORT TERM GOAL #2  ° Title Patient will be independent in progressive shoulder home exercise program in order to improve her shoulder strength and function   ° Baseline Patient has initial home exercise program and is comfortable with that but it is yet to be progressed for higher level activities   ° Time 4   ° Period Weeks   ° Status New   ° Target Date 04/20/21   ° °  °  ° °  ° ° ° ° PT Long Term Goals - 03/23/21 1657   ° °  ° PT LONG TERM GOAL #1  ° Title Patient will increase FOTO score to equal to or greater than  50   to demonstrate statistically significant improvement in mobility and quality of life.   ° Baseline 41 on 01/20/21   ° Time 8   ° Period Weeks   ° Status Achieved   ° Target Date 03/17/21   °  ° PT LONG TERM GOAL #2  ° Title Pt will improve mini BEST test by 4 points or more in order to indicate clinically significant improvent in balance.   °   Baseline see flowsheets 27 on 2/27    Time 12    Period Weeks     Status Achieved    Target Date 04/14/21      PT LONG TERM GOAL #3   Title Patient  will complete five times sit to stand test in < 15 seconds indicating an increased LE strength and improved balance.    Baseline 13.9 sec on 2/27    Time 12    Period Weeks    Status Achieved    Target Date 04/14/21      PT LONG TERM GOAL #4   Title Patient will increase 10 meter walk test to >1.75ms as to improve gait speed for better community ambulation and to reduce fall risk.    Baseline .86 m/s on 01/20/21 2/27: 1.27 m/s    Time 8    Period Weeks    Status Achieved    Target Date 03/17/21      PT LONG TERM GOAL #5   Title Pty will improve dual task TUG to less than 20 seconds in order to indicate improved cognitive and motor tasks.    Baseline 28.82 on 01/20/21 14.98 sec on 2/27    Time 12    Period Weeks    Status Achieved    Target Date 04/14/21      Additional Long Term Goals   Additional Long Term Goals Yes      PT LONG TERM GOAL #6   Title Patient will improve right shoulder by 1 increment on manual muscle test grading scale in order to indicate improved right upper extremity strength.    Baseline 3-/5 her right shoulder flexion and abduction(due to range of motion restriction) 3+ with external rotation at 0 degrees abduction    Time 8    Period Weeks    Status New    Target Date 05/18/21      PT LONG TERM GOAL #7   Title Patient will improve QuickDASH score by 12.5 points in order to indicate significantly improved subjective rating of right shoulder function    Baseline 50% on 03/23/2021    Time 8    Period Weeks    Status New    Target Date 05/18/21      PT LONG TERM GOAL #8   Title Patient will improve right shoulder active range of motion by 30 degrees with elevation and by 15 degrees with external rotation in order to indicate improved shoulder function for overhead activities and for self-care activities    Baseline 90 degrees flexion range of motion , 85 degrees abduction  range of motion, 25 degrees external rotation range of motion ( all AROM)    Time 8    Period Weeks    Status New    Target Date 05/18/21      PT LONG TERM GOAL  #9   TITLE Patient will improve right shoulder passive range of motion by 30 degrees of elevation by 10 degrees with external rotation in order to indicate improved right shoulder range of motion for progressive functional activities    Baseline 120 degrees flexion, 123 degrees abduction, 50 degrees external rotation at 45 degrees abduction on 03/23/2021    Time 8    Period Weeks    Status New    Target Date 05/18/21                   Plan - 03/23/21 1523     Clinical Impression Statement Patient presents  to physical therapy for reevaluation of her right shoulder.  Patient has met or nearly met all of her lower extremity balance goals and is being transition to shoulder therapy program for improvement of shoulder function and strength.  Patient presents with deficits in the right shoulder active and passive range of motion as well as with deficits in right shoulder strength they are limiting her in her ability to perform daily activities without pain and discomfort.  Patient also presents with significant disability of the arm and shoulder as evidenced by QuickDASH score of 50%.  Patient will benefit from skilled physical therapy intervention in order to improve her right shoulder pain, strength, range of motion, function, and quality of life.    Personal Factors and Comorbidities Age;Comorbidity 1;Comorbidity 2    Comorbidities arthritis, anxiety, HTN, hyperlipedemia.    Examination-Activity Limitations Carry;Lift;Locomotion Level;Squat;Stairs    Examination-Participation Restrictions Cleaning;Yard Work;Meal Prep    Stability/Clinical Decision Making Stable/Uncomplicated    Rehab Potential Good    PT Frequency 2x / week    PT Duration 12 weeks    PT Treatment/Interventions ADLs/Self Care Home Management;Gait  training;Therapeutic activities;Functional mobility training;Therapeutic exercise;Balance training;Neuromuscular re-education;Manual techniques;Energy conservation;Passive range of motion    PT Next Visit Plan Progress shoulder home exercise program    PT Home Exercise Plan Access Code: [email or web address]  URL: [email or web address]    Consulted and Agree with Plan of Care Patient             Patient will benefit from skilled therapeutic intervention in order to improve the following deficits and impairments:  Decreased activity tolerance, Decreased strength, Decreased mobility, Decreased balance  Visit Diagnosis: Muscle weakness (generalized)  Chronic right shoulder pain  Stiffness of right shoulder, not elsewhere classified     Problem List Patient Active Problem List   Diagnosis Date Noted   Acute stroke due to ischemia ([place]) 08/10/2020   History of total hip replacement, right 09/10/2019   Special screening for malignant neoplasms, colon    Benign neoplasm of ascending colon    Benign neoplasm of descending colon     Particia Lather, PT 03/23/2021, 5:06 PM  [facility] [address] [place], [name], [address] Phone: [phone]   Fax:  [phone]  Name: Kim Gomez MRN: [id number] Date of Birth: 09-11-1955

## 2021-03-23 NOTE — Therapy (Signed)
[place] [facility]IN [facility] [address] [place], [name], [address] Phone: [phone]   Fax:  [phone]  Occupational Therapy Progress Note  Dates of reporting period  01/29/2021   to   03/23/2021   Patient Details  Name: Kim Gomez MRN: [id number] Date of Birth: July 28, 1955 Referring Provider (OT): Joselyn Arrow   Encounter Date: 03/23/2021   OT End of Session - 03/23/21 1658     Visit Number 10    Number of Visits 24    Date for OT Re-Evaluation 04/25/21    OT Start Time 1650    OT Stop Time 1730    OT Time Calculation (min) 40 min    Activity Tolerance Patient limited by pain    Behavior During Therapy [facility] for tasks assessed/performed             Past Medical History:  Diagnosis Date   Anxiety    Arthritis    legs, hands   Cellulitis of right knee    started on antibiotics 06/21/15.     Depression    Family history of adverse reaction to anesthesia    sister and mom -PONV   Hypercholesteremia    Hypertension     Past Surgical History:  Procedure Laterality Date   ABDOMINAL HYSTERECTOMY     COLONOSCOPY WITH PROPOFOL N/A 06/27/2015   Procedure: COLONOSCOPY WITH PROPOFOL;  Surgeon: Lucilla Lame, MD;  Location: [place];  Service: Endoscopy;  Laterality: N/A;   POLYPECTOMY  06/27/2015   Procedure: POLYPECTOMY;  Surgeon: Lucilla Lame, MD;  Location: [place];  Service: Endoscopy;;   TOTAL HIP ARTHROPLASTY Right 09/10/2019   Procedure: TOTAL HIP ARTHROPLASTY ANTERIOR APPROACH;  Surgeon: Lovell Sheehan, MD;  Location: [facility];  Service: Orthopedics;  Laterality: Right;    There were no vitals filed for this visit.   Subjective Assessment - 03/23/21 1656     Subjective  Pt reports 8/10 pain in right arm today.    Pertinent History Kim Gomez is a 66 y.o. female with PMH significant for HTN, HLD, anxiety disorder, recently hospitalized for COVID-19 infection (08/06/2020) at an outside facility.  Patient was brought to the ED on 7/24 after she had a sudden onset of numbness involving right side of her body without any motor deficits, dysarthria, dysphagia. 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 Per chart for her f/u neuro visit:Left ischemic thalamic/internal capsule infarct presenting with right-sided numbness, weakness - now with residual cognitive fog (improving), imbalance (improving), fatigue, right first + second digit numbness, in patient with known vascular risk factors of hypertension, dyslipidemia, lack of physical activity, snoring    Currently in Pain? Yes    Pain Score 8     Pain Location Arm    Pain Orientation Right    Pain Descriptors / Indicators Aching    Pain Type Chronic pain                [facility] OT Assessment - 03/23/21 1659       Coordination   Right 9 Hole Peg Test 23      Hand Function   Right Hand Grip (lbs) 34    Right Hand Lateral Pinch 13 lbs   With the Saehan pinch meter   Right Hand 3 Point Pinch 12 lbs   With the Saehan            Overall AROM Comments Right shoulder  flexion 107, Abduction 84, elbow extension -6, elbow flexion 144, wrist extension: 60    Measurements were obtained, and goals were reviewed with the pt. Pt. has made progress overall with right shoulder ROM, grip strength, pinch strength, and [place] skills. Pt. Is progressing with daily self-care tasks more efficiently, however requires assist washing her back. Pt. Requires supervision for meal preparation, and cooking tasks. Pt.'s FOTO score is 51. Pt. reports 8/10 pain in her right shoulder, and reports that she is scheduled to have an injection next week. At that time, the pt. reports she plans to discuss moving forward with surgical options with her physician. Pt. Continues to work on improving Right UE functioning in order to improve engagement in daily ADL, and IADL tasks, as well as maximize safety, and  independence.                 OT Education - 03/23/21 1657     Education Details [place], reaching tasks    Person(s) Educated Patient    Methods Explanation    Comprehension Verbalized understanding;Returned demonstration;Verbal cues required;Need further instruction                 OT Long Term Goals - 03/23/21 1723       OT LONG TERM GOAL #1   Title Patient will be independent with home exercise program    Baseline 03/23/2021: Pt. is independent.    Time 12    Period Weeks    Status On-going    Target Date 04/25/21      OT LONG TERM GOAL #2   Title Patient will complete basic self-care tasks with modified independence and without increased time to complete tasks    Baseline Patient has difficulty with washing her back.  Pt. requires increased time to complete self-care tasks.    Time 6    Period Weeks    Status Partially Met    Target Date 04/25/21      OT LONG TERM GOAL #3   Title Patient will demonstrate good safety awareness in the kitchen with cooking tasks with supervision    Baseline Pt. requires Supervision for safety awareness.    Time 12    Period Weeks    Status On-going    Target Date 04/25/21      OT LONG TERM GOAL #4   Title Patient will increase right grip strength by 10 pounds to be able to open jars and containers with modified independence    Baseline 28 pounds at eval and difficulty with managing jars and containers. 10th visit: R: 34# Jars,a nd containers continue to be difficulty to open.    Time 12    Period Weeks    Status Partially Met    Target Date 04/25/21      OT LONG TERM GOAL #5   Title Patient will improve right pinch strength by 5 pounds to pick up and manipulate items with modified independence    Baseline Right lateral 5 pounds, 3 point 4#, 10th visit: right lateral pinch 13#    Time 12    Period Weeks    Status Partially Met    Target Date 04/25/21      OT LONG TERM GOAL #6   Title Patient will decrease right  shoulder pain to 2 out of 10 or less with movement patterns for daily activities.    Baseline Increased pain in right shoulder, 5 out of 10 with movement. 10th visit: 8/10 right shoulder pain  Time 12    Period Weeks    Status New    Target Date 04/25/21      OT LONG TERM GOAL #7   Title Patient will demonstrate score of 59 or greater on FOTO to show a clinically relevant change to impact self-care activities with greater independence    Baseline Score of 55 at eval, 10th visit: FOTO score: 51, increased shoulder  pain limiting overhead activity.    Time 12    Period Weeks    Status On-going    Target Date 04/25/21      OT LONG TERM GOAL #8   Title Will assess for potential return to work tasks as patient progresses with therapy    Baseline Patient currently unable to perform her job as outlined in her job description    Time 12    Period Weeks    Status On-going    Target Date 04/25/21                   Plan - 03/23/21 1723     Clinical Impression Statement Measurements were obtained, and goals were reviewed with the pt. Pt. has made progress overall with right shoulder ROM, grip strength, pinch strength, and [place] skills. Pt. Is progressing with daily self-care tasks more efficiently, however requires assist washing her back. Pt. Requires supervision for meal preparation, and cooking tasks. Pt.'s FOTO score is 51. Pt. reports 8/10 pain in her right shoulder, and reports that she is scheduled to have an injection next week. At that time, the pt. reports she plans to discuss moving forward with surgical options with her physician. Pt. Continues to work on improving Right UE functioning in order to improve engagement in daily ADL, and IADL tasks, as well as maximize safety, and independence.         OT Occupational Profile and History Problem Focused Assessment - Including review of records relating to presenting problem    Occupational performance deficits (Please refer to  evaluation for details): ADL's;IADL's;Social Participation    Body Structure / Function / Physical Skills ADL;Dexterity;ROM;Strength;Coordination;FMC;IADL;Pain;Sensation;UE functional use;Decreased knowledge of use of DME    Cognitive Skills Memory;Safety Awareness    Psychosocial Skills Environmental  Adaptations;Habits;Routines and Behaviors    Rehab Potential Good    Clinical Decision Making Several treatment options, min-mod task modification necessary    Comorbidities Affecting Occupational Performance: May have comorbidities impacting occupational performance    Modification or Assistance to Complete Evaluation  No modification of tasks or assist necessary to complete eval    OT Frequency 2x / week    OT Duration 12 weeks    OT Treatment/Interventions Self-care/ADL training;Cryotherapy;Paraffin;Therapeutic exercise;DME and/or AE instruction;Cognitive remediation/compensation;Neuromuscular education;Manual Therapy;Moist Heat;Contrast Bath;Therapeutic activities;Patient/family education    Consulted and Agree with Plan of Care Patient             Patient will benefit from skilled therapeutic intervention in order to improve the following deficits and impairments:   Body Structure / Function / Physical Skills: ADL, Dexterity, ROM, Strength, Coordination, FMC, IADL, Pain, Sensation, UE functional use, Decreased knowledge of use of DME Cognitive Skills: Memory, Safety Awareness Psychosocial Skills: Environmental  Adaptations, Habits, Routines and Behaviors   Visit Diagnosis: Muscle weakness (generalized)  Other lack of coordination    Problem List Patient Active Problem List   Diagnosis Date Noted   Acute stroke due to ischemia ([place]) 08/10/2020   History of total hip replacement, right 09/10/2019   Special screening for malignant neoplasms,  colon    Benign neoplasm of ascending colon    Benign neoplasm of descending colon    Harrel Carina, MS, OTR/L   Harrel Carina, OT 03/23/2021, 5:45 PM  [place] [facility]IN [facility] SERVICES [address] [place], [name], [address] Phone: [phone]   Fax:  [phone]  Name: Kim Gomez MRN: [id number] Date of Birth: 06/30/1955

## 2021-03-24 NOTE — Therapy (Signed)
[place] [facility] [facility] ?[place], [name], [address] ?Phone: [phone]   Fax:  [phone] ? ?Speech Language Pathology Treatment ? ?Patient Details  ?Name: Kim Gomez ?MRN: [id number] ?Date of Birth: November 14, 1955 ?Referring Provider (SLP): Dr. Jennings Books ? ? ?Encounter Date: 03/23/2021 ? ? End of Session - 03/24/21 5916   ? ? Visit Number 13   ? Number of Visits 25   ? Date for SLP Re-Evaluation 03/24/21   ? Authorization - Visit Number 3   ? Progress Note Due on Visit 10   ? SLP Start Time 1600   ? SLP Stop Time  1645   ? SLP Time Calculation (min) 45 min   ? Activity Tolerance Patient tolerated treatment well   ? ?  ?  ? ?  ? ? ?Past Medical History:  ?Diagnosis Date  ? Anxiety   ? Arthritis   ? legs, hands  ? Cellulitis of right knee   ? started on antibiotics 06/21/15.    ? Depression   ? Family history of adverse reaction to anesthesia   ? sister and mom -PONV  ? Hypercholesteremia   ? Hypertension   ? ? ?Past Surgical History:  ?Procedure Laterality Date  ? ABDOMINAL HYSTERECTOMY    ? COLONOSCOPY WITH PROPOFOL N/A 06/27/2015  ? Procedure: COLONOSCOPY WITH PROPOFOL;  Surgeon: Lucilla Lame, MD;  Location: [place];  Service: Endoscopy;  Laterality: N/A;  ? POLYPECTOMY  06/27/2015  ? Procedure: POLYPECTOMY;  Surgeon: Lucilla Lame, MD;  Location: [place];  Service: Endoscopy;;  ? TOTAL HIP ARTHROPLASTY Right 09/10/2019  ? Procedure: TOTAL HIP ARTHROPLASTY ANTERIOR APPROACH;  Surgeon: Lovell Sheehan, MD;  Location: [facility];  Service: Orthopedics;  Laterality: Right;  ? ? ?There were no vitals filed for this visit. ? ? Subjective Assessment - 03/24/21 0809   ? ? Subjective "I cooked this weekend."   ? Currently in Pain? Yes   ? Pain Score 8    ? Pain Location Arm   ? ?  ?  ? ?  ? ? ? ? ? ? ? ? ADULT SLP TREATMENT - 03/24/21 0810   ? ?  ? General Information  ? Behavior/Cognition Alert;Cooperative;Pleasant mood   ? HPI Patient is a 66  y.o. female who admitted 08/10/20 and found to have Covid-19 as well as left ischemic thalamic/internal capsule infarct. Acute SLP screened and s/o; pt now with residual cognitive fog, imbalance, fatigue, right first + second digit numbness.   ?  ? Treatment Provided  ? Treatment provided Cognitive-Linquistic   ?  ? Cognitive-Linquistic Treatment  ? Treatment focused on Cognition;Patient/family/caregiver education   ? Skilled Treatment Pt retrieved calendar independently, however min cues required to reference habit tracker when discussing her weekend activities. Min cues necessary to check off on her tracker (walk and cooking over the weekend). Targeted attention and executive function skills using weekly planner. Pt looked at habit tracker goals and initiated scheduling tasks for the upcoming week using her planner with min cues (including beginning a shopping list and scheduling a trip to the grocery store to purchase supplies for her weekend meal plan). Targeted written expression as well use of attention strategies in functional task by having pt generate recipe instructions/plan for cooking a simple meal on Tuesday. Pt required min question cues to ID omitted steps in her sequence.   ?  ? Assessment / Recommendations / Plan  ? Plan Continue with current  plan of care   ?  ? Progression Toward Goals  ? Progression toward goals Progressing toward goals   ? ?  ?  ? ?  ? ? ? ? ? SLP Short Term Goals - 02/24/21 1129   ? ?  ? SLP SHORT TERM GOAL #1  ? Title Pt will verbalize and demonstrate how to implement 2 memory and attention strategies to aid daily functioning given occasional min A over 2 sessions   ? Status Partially Met   ?  ? SLP SHORT TERM GOAL #2  ? Title Pt will manage finances with use of compensations with no reported episodes of missed bills given rare min A   ? Status On-going   ?  ? SLP SHORT TERM GOAL #3  ? Title Pt will identify errors in writing and self-correct with 80% accuracy given rare min A  over 2 sessions   ? Status Partially Met   ?  ? SLP SHORT TERM GOAL #4  ? Title Pt will participate in further language assessment as needed (goals to be added)   ? Status On-going   ? ?  ?  ? ?  ? ? ? SLP Long Term Goals - 02/24/21 1130   ? ?  ? SLP LONG TERM GOAL #1  ? Title Pt will verbalize and implement 4 attention and memory strategies to aid daily functioning for ADLs and IADLs with rare min A over 2 sessions   ? Status On-going   ?  ? SLP LONG TERM GOAL #2  ? Title Pt will identify and correct paraphasias in conversation and writing with 90% accuracy given rare min A over 2 sessions   ? Status On-going   ?  ? SLP LONG TERM GOAL #3  ? Title Pt will use compensations for thought organization effectively in 20 minutes mod complex conversation.   ? Status Revised   ? ?  ?  ? ?  ? ? ? Plan - 03/24/21 0824   ? ? Clinical Impression Statement Patient presents with mild cognitive communication impairment. Vocal quality appears close to baseline after prolonged respiratory illness in Jan/Feb. Pt reports using her checklist to assist with meal preparation sucessfully. Pt initiating planning and prioritizing tasks to gradually increase activity level and manage cognitive demands, however requires occasional cues for consistency and to recognize additional opportunities where she may benefit from using trained strategies. I recommend skilled ST to maximize pt's cognitive communication and language skills to improve accuracy/independence with ADLs and IADLs, reduce communication breakdowns, and improve quality of life.   ? Speech Therapy Frequency 2x / week   ? Duration 12 weeks   ? Treatment/Interventions Language facilitation;Environmental controls;SLP instruction and feedback;Cognitive reorganization;Compensatory strategies;Patient/family education;Internal/external aids   ? Potential to Achieve Goals Good   ? SLP Home Exercise Plan see pt instructions   ? Consulted and Agree with Plan of Care Patient   ? ?  ?  ? ?   ? ? ?Patient will benefit from skilled therapeutic intervention in order to improve the following deficits and impairments:   ?Cognitive communication deficit ? ? ? ?Problem List ?Patient Active Problem List  ? Diagnosis Date Noted  ? Acute stroke due to ischemia ([place]) 08/10/2020  ? 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 Special screening for malignant neoplasms, colon   ? Benign neoplasm of ascending colon   ? Benign neoplasm of descending colon   ? ?Kim Lever, MS, CCC-SLP ?Speech-Language Pathologist ?([phone] ? ?  Kim Gomez, CCC-SLP ?03/24/2021, 8:28 AM ? ?[place] ?[place] [facility] ?[place], [name], [address] ?Phone: [phone]   Fax:  [phone] ? ? ?Name: Kim Gomez ?MRN: [id number] ?Date of Birth: 1955-08-06 ? ?

## 2021-03-25 ENCOUNTER — Ambulatory Visit: Payer: No Typology Code available for payment source | Admitting: Occupational Therapy

## 2021-03-25 ENCOUNTER — Ambulatory Visit: Payer: No Typology Code available for payment source | Admitting: Physical Therapy

## 2021-03-26 ENCOUNTER — Other Ambulatory Visit: Payer: Self-pay

## 2021-03-26 ENCOUNTER — Ambulatory Visit: Payer: No Typology Code available for payment source

## 2021-03-26 ENCOUNTER — Encounter: Payer: No Typology Code available for payment source | Admitting: Occupational Therapy

## 2021-03-26 ENCOUNTER — Encounter: Payer: No Typology Code available for payment source | Admitting: Speech Pathology

## 2021-03-26 ENCOUNTER — Ambulatory Visit: Payer: No Typology Code available for payment source | Admitting: Speech Pathology

## 2021-03-26 DIAGNOSIS — R41841 Cognitive communication deficit: Secondary | ICD-10-CM

## 2021-03-26 DIAGNOSIS — G8929 Other chronic pain: Secondary | ICD-10-CM

## 2021-03-26 DIAGNOSIS — M6281 Muscle weakness (generalized): Secondary | ICD-10-CM

## 2021-03-26 DIAGNOSIS — M25611 Stiffness of right shoulder, not elsewhere classified: Secondary | ICD-10-CM

## 2021-03-26 DIAGNOSIS — R278 Other lack of coordination: Secondary | ICD-10-CM

## 2021-03-26 NOTE — Therapy (Signed)
[place] MAIN [facility] SERVICES [address] [place], [name], [address] Phone: [phone]   Fax:  [phone]  Physical Therapy Treatment  Patient Details  Name: Kim Gomez MRN: [id number] Date of Birth: 21-Feb-1955 Referring Provider (PT): Jennings Books MD   Encounter Date: 03/26/2021   PT End of Session - 03/26/21 1443     Visit Number 12    Number of Visits 24    Date for PT Re-Evaluation 05/18/21    PT Start Time 0804    PT Stop Time 3419    PT Time Calculation (min) 43 min    Activity Tolerance Patient limited by pain;Patient tolerated treatment well    Behavior During Therapy [facility] for tasks assessed/performed             Past Medical History:  Diagnosis Date   Anxiety    Arthritis    legs, hands   Cellulitis of right knee    started on antibiotics 06/21/15.     Depression    Family history of adverse reaction to anesthesia    sister and mom -PONV   Hypercholesteremia    Hypertension     Past Surgical History:  Procedure Laterality Date   ABDOMINAL HYSTERECTOMY     COLONOSCOPY WITH PROPOFOL N/A 06/27/2015   Procedure: COLONOSCOPY WITH PROPOFOL;  Surgeon: Lucilla Lame, MD;  Location: [place];  Service: Endoscopy;  Laterality: N/A;   POLYPECTOMY  06/27/2015   Procedure: POLYPECTOMY;  Surgeon: Lucilla Lame, MD;  Location: [place];  Service: Endoscopy;;   TOTAL HIP ARTHROPLASTY Right 09/10/2019   Procedure: TOTAL HIP ARTHROPLASTY ANTERIOR APPROACH;  Surgeon: Lovell Sheehan, MD;  Location: [facility];  Service: Orthopedics;  Laterality: Right;    There were no vitals filed for this visit.   Subjective Assessment - 03/26/21 0806     Subjective Pt reports R shoulder pain this morning. No other updates noted.    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 Pt  reports a fall on october 29th. Pt rpeorts she hurt the R UE with the fall and she reports some stiffness in the knee as well as general weakness on the right side. Pt reports she has had 2 strokes but the first went under the radar and was only evidenced by imaging presented to her by her MD. Pt reports she has been modifying her activities in order to be careful not to fall since the stroke. Reports she has been taking her time with a lot of things. Pt reports she has a membership at [employer] and she has been walking on the treadmill 2-3 times per week, she reports she walks on the treadmill for an hour at a time following working her way up. Pt reports ocassional numbness and tingling in her hands.    Limitations House hold activities    How long can you walk comfortably? 1 hour with UE support    Patient Stated Goals Improve her shoulder pain and function on the right side    Currently in Pain? Yes    Pain Location Shoulder    Pain Orientation Right    Pain Onset More than a month ago              INTERVENTIONS  Manual: Heat donned to R shoulder, pt supine on plinth as PT provides  the following interventions- PROM R shoulder: flexion, abduction, ER/IR x multiple reps of each with UE held at end range in 1-2 30 sec bouts for additional stretch. Pt very limited with flex/abd due to pain, remaining reps performed in pain-free range/pain-limited range. PT then provides STM and TrP release to R bicep, R posterior shoulder musculature, R upper trap, B cervical paraspinals and muscles of occipital triangle x 11 minutes.  The pt is TTP throughout entire R side. Responds well to [facility] and TrP release.  Heat removed. No adverse reaction to treatment observed or reported. Skin appears WNL.  Therex:  PVC exercises: Chest press 10x through limited ROM. Pt rates as medium R shoulder ER 10x  R shoulder flexion 15x through limited ROM.  Isometrics: RUE, each motion performed 10x with 3 sec  holds ER, IR, Extension, Flexion, Abduction Comments: Pt reports no pain with isometrics.   Pt seated. Mirror in front of pt for additional cue: B shoulder shrugs 10x. Cuing for controlled movement throughout.  Pt educated throughout session about proper posture and technique with exercises. Improved exercise technique, movement at target joints, use of target muscles after min to mod verbal, visual, tactile cues.   PT Education - 03/26/21 1443     Education Details exercise technique    Person(s) Educated Patient    Methods Explanation;Demonstration;Tactile cues;Verbal cues    Comprehension Verbalized understanding;Returned demonstration;Tactile cues required;Need further instruction              PT Short Term Goals - 03/23/21 1656       PT SHORT TERM GOAL #1   Title Patient will be independent in home exercise program to improve strength/mobility for better functional independence with ADLs.    Baseline no HEP at this time    Time 4    Period Weeks    Status Achieved    Target Date 02/17/21      PT SHORT TERM GOAL #2   Title Patient will be independent in progressive shoulder home exercise program in order to improve her shoulder strength and function    Baseline Patient has initial home exercise program and is comfortable with that but it is yet to be progressed for higher level activities    Time 4    Period Weeks    Status New    Target Date 04/20/21               PT Long Term Goals - 03/23/21 1657       PT LONG TERM GOAL #1   Title Patient will increase FOTO score to equal to or greater than  50   to demonstrate statistically significant improvement in mobility and quality of life.    Baseline 41 on 01/20/21    Time 8    Period Weeks    Status Achieved    Target Date 03/17/21      PT LONG TERM GOAL #2   Title Pt will improve mini BEST test by 4 points or more in order to indicate clinically significant improvent in balance.    Baseline see flowsheets  27 on 2/27    Time 12    Period Weeks    Status Achieved    Target Date 04/14/21      PT LONG TERM GOAL #3   Title Patient  will complete five times sit to stand test in < 15 seconds indicating an increased LE strength and improved balance.    Baseline 13.9 sec on 2/27  Time 12    Period Weeks    Status Achieved    Target Date 04/14/21      PT LONG TERM GOAL #4   Title Patient will increase 10 meter walk test to >1.54ms as to improve gait speed for better community ambulation and to reduce fall risk.    Baseline .86 m/s on 01/20/21 2/27: 1.27 m/s    Time 8    Period Weeks    Status Achieved    Target Date 03/17/21      PT LONG TERM GOAL #5   Title Pty will improve dual task TUG to less than 20 seconds in order to indicate improved cognitive and motor tasks.    Baseline 28.82 on 01/20/21 14.98 sec on 2/27    Time 12    Period Weeks    Status Achieved    Target Date 04/14/21      Additional Long Term Goals   Additional Long Term Goals Yes      PT LONG TERM GOAL #6   Title Patient will improve right shoulder by 1 increment on manual muscle test grading scale in order to indicate improved right upper extremity strength.    Baseline 3-/5 her right shoulder flexion and abduction(due to range of motion restriction) 3+ with external rotation at 0 degrees abduction    Time 8    Period Weeks    Status New    Target Date 05/18/21      PT LONG TERM GOAL #7   Title Patient will improve QuickDASH score by 12.5 points in order to indicate significantly improved subjective rating of right shoulder function    Baseline 50% on 03/23/2021    Time 8    Period Weeks    Status New    Target Date 05/18/21      PT LONG TERM GOAL #8   Title Patient will improve right shoulder active range of motion by 30 degrees with elevation and by 15 degrees with external rotation in order to indicate improved shoulder function for overhead activities and for self-care activities    Baseline 90 degrees  flexion range of motion , 85 degrees abduction range of motion, 25 degrees external rotation range of motion ( all AROM)    Time 8    Period Weeks    Status New    Target Date 05/18/21      PT LONG TERM GOAL  #9   TITLE Patient will improve right shoulder passive range of motion by 30 degrees of elevation by 10 degrees with external rotation in order to indicate improved right shoulder range of motion for progressive functional activities    Baseline 120 degrees flexion, 123 degrees abduction, 50 degrees external rotation at 45 degrees abduction on 03/23/2021    Time 8    Period Weeks    Status New    Target Date 05/18/21                   Plan - 03/26/21 1444     Clinical Impression Statement Pt with excellent motivation to participate in session. Pt most pain limited with PROM in R shoulder flexion and abduction to just under 90 deg. for each. The pt also TTP throughout R posterior shoulder musculature, R upper trap, R cervical paraspinals and in muscles of occipital triangle. The [facility]. Pt able to perform isometrics without increase in pain level. The pt will benefit from further skilled physical  therapy to improve R shoulder pain, strength, ROM and function to increase QOL.    Personal Factors and Comorbidities Age;Comorbidity 1;Comorbidity 2    Comorbidities arthritis, anxiety, HTN, hyperlipedemia.    Examination-Activity Limitations Carry;Lift;Locomotion Level;Squat;Stairs    Examination-Participation Restrictions Cleaning;Yard Work;Meal Prep    Stability/Clinical Decision Making Stable/Uncomplicated    Rehab Potential Good    PT Frequency 2x / week    PT Duration 12 weeks    PT Treatment/Interventions ADLs/Self Care Home Management;Gait training;Therapeutic activities;Functional mobility training;Therapeutic exercise;Balance training;Neuromuscular re-education;Manual techniques;Energy conservation;Passive range of motion    PT  Next Visit Plan Progress shoulder home exercise program, continue POC as previously indicated    PT Home Exercise Plan Access Code: [email or web address]  URL: [email or web address]; no updates    Consulted and Agree with Plan of Care Patient             Patient will benefit from skilled therapeutic intervention in order to improve the following deficits and impairments:  Decreased activity tolerance, Decreased strength, Decreased mobility, Decreased balance  Visit Diagnosis: Stiffness of right shoulder, not elsewhere classified  Chronic right shoulder pain  Muscle weakness (generalized)     Problem List Patient Active Problem List   Diagnosis Date Noted   Acute stroke due to ischemia ([place]) 08/10/2020   History of total hip replacement, right 09/10/2019   Special screening for malignant neoplasms, colon    Benign neoplasm of ascending colon    Benign neoplasm of descending colon     Zollie Pee, PT 03/26/2021, 2:54 PM  [place] [address] [place], [name], [address] Phone: [phone]   Fax:  [phone]  Name: Kim Gomez MRN: [id number] Date of Birth: 1955/08/22

## 2021-03-26 NOTE — Addendum Note (Signed)
Addended by: Aliene Altes on: 03/26/2021 12:55 PM ? ? Modules accepted: Orders ? ?

## 2021-03-26 NOTE — Therapy (Signed)
[place] MAIN [facility] SERVICES [address] [place], [name], [address] Phone: [phone]   Fax:  [phone]  Occupational Therapy Treatment  Patient Details  Name: Kim Gomez MRN: [id number] Date of Birth: 1955/11/16 Referring Provider (OT): Joselyn Arrow   Encounter Date: 03/26/2021   OT End of Session - 03/26/21 0905     Visit Number 11    Number of Visits 24    Date for OT Re-Evaluation 04/25/21    OT Start Time 0848    OT Stop Time 0930    OT Time Calculation (min) 42 min    Activity Tolerance Patient limited by pain    Behavior During Therapy [facility] for tasks assessed/performed             Past Medical History:  Diagnosis Date   Anxiety    Arthritis    legs, hands   Cellulitis of right knee    started on antibiotics 06/21/15.     Depression    Family history of adverse reaction to anesthesia    sister and mom -PONV   Hypercholesteremia    Hypertension     Past Surgical History:  Procedure Laterality Date   ABDOMINAL HYSTERECTOMY     COLONOSCOPY WITH PROPOFOL N/A 06/27/2015   Procedure: COLONOSCOPY WITH PROPOFOL;  Surgeon: Lucilla Lame, MD;  Location: [place];  Service: Endoscopy;  Laterality: N/A;   POLYPECTOMY  06/27/2015   Procedure: POLYPECTOMY;  Surgeon: Lucilla Lame, MD;  Location: [place];  Service: Endoscopy;;   TOTAL HIP ARTHROPLASTY Right 09/10/2019   Procedure: TOTAL HIP ARTHROPLASTY ANTERIOR APPROACH;  Surgeon: Lovell Sheehan, MD;  Location: [facility];  Service: Orthopedics;  Laterality: Right;    There were no vitals filed for this visit.   Subjective Assessment - 03/26/21 0856     Subjective  "I don't think I'm going to get many shots.  I think I'm going to do the surgery for my shoulder."    Pertinent History Kim Gomez is a 66 y.o. female with PMH significant for HTN, HLD, anxiety disorder, recently hospitalized for COVID-19 infection (08/06/2020) at an outside facility. Patient  was brought to the ED on 7/24 after she had a sudden onset of numbness involving right side of her body without any motor deficits, dysarthria, dysphagia. 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 Per chart for her f/u neuro visit:Left ischemic thalamic/internal capsule infarct presenting with right-sided numbness, weakness - now with residual cognitive fog (improving), imbalance (improving), fatigue, right first + second digit numbness, in patient with known vascular risk factors of hypertension, dyslipidemia, lack of physical activity, snoring    Patient Stated Goals Pt reports she wants to get back to doing things more independently like she was before COVID and CVA.    Currently in Pain? Yes    Pain Score 4     Pain Location Shoulder    Pain Orientation Right    Pain Descriptors / Indicators Dull    Pain Type Chronic pain    Pain Radiating Towards RUE    Pain Onset More than a month ago    Pain Frequency Intermittent    Aggravating Factors  R shoulder movement    Pain Relieving Factors rest, heat, tylenol    Effect of Pain on Daily Activities limited reaching with RUE    Multiple Pain Sites No            Occupational  Therapy Treatment: Neuro re-ed: Participated in coin manipulation activities with coin pick up from table, storage of coins within palm of hand, transferring coins from palm of hand to fingertips to enable discarding coins one at a time without dropping, rotating coins within tips of fingers.  Educated pt on coin manipulation activities for home practice with pt able to verbalize understanding.  Progressed to pinching clips with forearm pron/sup for each pinch type, with focus on quick/alternating movements in R hand with slight incoordination noted/extra time.  Therapeutic Exercise: Facilitated R hand strengthening with use of hand gripper set at moderate resistance with 1 green band to complete 3 sets 10 reps  of hand squeezes, alternated tasks then performed another 2 sets 10 reps.  Facilitated pinch strengthening with use of therapy resistant clothespins to target lateral and 3 point pinch of R hand.  Pinched all colors x2 sets for each pinch type noted above.    Response to Treatment: See Plan/clinical impression below.    OT Education - 03/26/21 0904     Education Details [place], coin manipulation activities    Person(s) Educated Patient    Methods Explanation    Comprehension Verbalized understanding;Returned demonstration;Verbal cues required;Need further instruction                 OT Long Term Goals - 03/23/21 1723       OT LONG TERM GOAL #1   Title Patient will be independent with home exercise program    Baseline 03/23/2021: Pt. is independent.    Time 12    Period Weeks    Status On-going    Target Date 04/25/21      OT LONG TERM GOAL #2   Title Patient will complete basic self-care tasks with modified independence and without increased time to complete tasks    Baseline Patient has difficulty with washing her back.  Pt. requires increased time to complete self-care tasks.    Time 6    Period Weeks    Status Partially Met    Target Date 04/25/21      OT LONG TERM GOAL #3   Title Patient will demonstrate good safety awareness in the kitchen with cooking tasks with supervision    Baseline Pt. requires Supervision for safety awareness.    Time 12    Period Weeks    Status On-going    Target Date 04/25/21      OT LONG TERM GOAL #4   Title Patient will increase right grip strength by 10 pounds to be able to open jars and containers with modified independence    Baseline 28 pounds at eval and difficulty with managing jars and containers. 10th visit: R: 34# Jars,a nd containers continue to be difficulty to open.    Time 12    Period Weeks    Status Partially Met    Target Date 04/25/21      OT LONG TERM GOAL #5   Title Patient will improve right pinch strength by 5  pounds to pick up and manipulate items with modified independence    Baseline Right lateral 5 pounds, 3 point 4#, 10th visit: right lateral pinch 13#    Time 12    Period Weeks    Status Partially Met    Target Date 04/25/21      OT LONG TERM GOAL #6   Title Patient will decrease right shoulder pain to 2 out of 10 or less with movement patterns for daily activities.  Baseline Increased pain in right shoulder, 5 out of 10 with movement. 10th visit: 8/10 right shoulder pain    Time 12    Period Weeks    Status New    Target Date 04/25/21      OT LONG TERM GOAL #7   Title Patient will demonstrate score of 59 or greater on FOTO to show a clinically relevant change to impact self-care activities with greater independence    Baseline Score of 55 at eval, 10th visit: FOTO score: 51, increased shoulder  pain limiting overhead activity.    Time 12    Period Weeks    Status On-going    Target Date 04/25/21      OT LONG TERM GOAL #8   Title Will assess for potential return to work tasks as patient progresses with therapy    Baseline Patient currently unable to perform her job as outlined in her job description    Time 12    Period Weeks    Status On-going    Target Date 04/25/21                   Plan - 03/26/21 1613     Clinical Impression Statement Hot pack placed to R shoulder for duration of tx for pain management/muscle relaxation while pt participated in therapeutic exercises and neuro re-ed noted above.  Good tolerance to distal strengthening and coordination training this day, but required support with pillow under RUE to minimize shoulder pain and optimize distal mobility.  Pt continues to present with R hand weakness and mild dexterity deficits in R hand.  Pt will continue to benefit from skilled OT to address above noted impairments.    OT Occupational Profile and History Problem Focused Assessment - Including review of records relating to presenting problem     Occupational performance deficits (Please refer to evaluation for details): ADL's;IADL's;Social Participation    Body Structure / Function / Physical Skills ADL;Dexterity;ROM;Strength;Coordination;FMC;IADL;Pain;Sensation;UE functional use;Decreased knowledge of use of DME    Cognitive Skills Memory;Safety Awareness    Psychosocial Skills Environmental  Adaptations;Habits;Routines and Behaviors    Rehab Potential Good    Clinical Decision Making Several treatment options, min-mod task modification necessary    Comorbidities Affecting Occupational Performance: May have comorbidities impacting occupational performance    Modification or Assistance to Complete Evaluation  No modification of tasks or assist necessary to complete eval    OT Frequency 2x / week    OT Duration 12 weeks    OT Treatment/Interventions Self-care/ADL training;Cryotherapy;Paraffin;Therapeutic exercise;DME and/or AE instruction;Cognitive remediation/compensation;Neuromuscular education;Manual Therapy;Moist Heat;Contrast Bath;Therapeutic activities;Patient/family education    Consulted and Agree with Plan of Care Patient             Patient will benefit from skilled therapeutic intervention in order to improve the following deficits and impairments:   Body Structure / Function / Physical Skills: ADL, Dexterity, ROM, Strength, Coordination, FMC, IADL, Pain, Sensation, UE functional use, Decreased knowledge of use of DME Cognitive Skills: Memory, Safety Awareness Psychosocial Skills: Environmental  Adaptations, Habits, Routines and Behaviors   Visit Diagnosis: Muscle weakness (generalized)  Other lack of coordination    Problem List Patient Active Problem List   Diagnosis Date Noted   Acute stroke due to ischemia ([place]) 08/10/2020   History of total hip replacement, right 09/10/2019   Special screening for malignant neoplasms, colon    Benign neoplasm of ascending colon    Benign neoplasm of descending colon     Leta Speller,  MS, OTR/L  Darleene Cleaver, OT 03/26/2021, 4:13 PM  [place] MAIN [facility] SERVICES [address] [place], [name], [address] Phone: [phone]   Fax:  [phone]  Name: Kim Gomez MRN: [id number] Date of Birth: 11-12-1955

## 2021-03-26 NOTE — Therapy (Addendum)
[place] MAIN [facility] SERVICES [address] [place], [name], [address] Phone: [phone]   Fax:  [phone]  Speech Language Pathology Treatment and Recertification  Patient Details  Name: Kim Gomez MRN: [id number] Date of Birth: March 27, 1955 Referring Provider (SLP): Dr. Jennings Books   Encounter Date: 03/26/2021   End of Session - 03/26/21 1100     Visit Number 14    Number of Visits 25    Date for SLP Re-Evaluation 06/24/21    Authorization - Visit Number 4    Progress Note Due on Visit 10    SLP Start Time [phone]    SLP Stop Time  1054    SLP Time Calculation (min) 61 min    Activity Tolerance Patient tolerated treatment well             Past Medical History:  Diagnosis Date   Anxiety    Arthritis    legs, hands   Cellulitis of right knee    started on antibiotics 06/21/15.     Depression    Family history of adverse reaction to anesthesia    sister and mom -PONV   Hypercholesteremia    Hypertension     Past Surgical History:  Procedure Laterality Date   ABDOMINAL HYSTERECTOMY     COLONOSCOPY WITH PROPOFOL N/A 06/27/2015   Procedure: COLONOSCOPY WITH PROPOFOL;  Surgeon: Lucilla Lame, MD;  Location: [place];  Service: Endoscopy;  Laterality: N/A;   POLYPECTOMY  06/27/2015   Procedure: POLYPECTOMY;  Surgeon: Lucilla Lame, MD;  Location: [place];  Service: Endoscopy;;   TOTAL HIP ARTHROPLASTY Right 09/10/2019   Procedure: TOTAL HIP ARTHROPLASTY ANTERIOR APPROACH;  Surgeon: Lovell Sheehan, MD;  Location: [facility];  Service: Orthopedics;  Laterality: Right;    There were no vitals filed for this visit.   Subjective Assessment - 03/26/21 1111     Subjective "I cooked on Tuesday, it went well"    Currently in Pain? Yes    Pain Score 2     Pain Location Shoulder    Pain Orientation Right                   ADULT SLP TREATMENT - 03/26/21 1112       General Information    Behavior/Cognition Alert;Cooperative;Pleasant mood    HPI Patient is a 66 y.o. female who admitted 08/10/20 and found to have Covid-19 as well as left ischemic thalamic/internal capsule infarct. Acute SLP screened and s/o; pt now with residual cognitive fog, imbalance, fatigue, right first + second digit numbness.      Treatment Provided   Treatment provided Cognitive-Linquistic      Cognitive-Linquistic Treatment   Treatment focused on Cognition;Patient/family/caregiver education    Skilled Treatment Pt got calendar and habit tracker out independently to reference past and upcoming activities. Pt reports forgetting to check off activities and with mod I checked them off (walking and cooking on tues/wed). Reviewed personal goals and facilitated adding another goal to habit tracker for next week (going to the gym ). With min assist, pt reviewed her plans for the weekend. Pt pointed out her scheduled grocery store trip tomorrow and her unfinished grocery list. Pt required min cues to finish adding items to her grocery list. Pt reports she is managing finances well and has not missed any bills. She reports that she pays her bills via phone on the same day she gets it in the mail. For bills that are  not by mail, she has them written in her calendar (ie storage fee). Targeted alternating attention during grocery budgeting task. Pt wrote down prices of items with 100% acc (10/10) with min cues. During this task, pt simultaneously engaged in conversation with graduate clinician. Pt required rare min cues to remember where she left off in the task. Targeted attention and executive functioning skills with scheduling activity. Pt was able to organize activities by importance (time and location) with min-mod verbal and visual cues. Pt benefited from cues to highlight key details, check activities off, and to focus on one thing at a time. Reviewed using these strategies in daily activities (ie writing down recipes,  making grocery lists, scheduling activities)      Assessment / Recommendations / Plan   Plan Continue with current plan of care      Progression Toward Goals   Progression toward goals Progressing toward goals              SLP Education - 03/26/21 1140     Education Details Using memory/attention strategies in daily activities    Person(s) Educated Patient    Methods Explanation    Comprehension Verbalized understanding              SLP Short Term Goals - 03/26/21 1247       SLP SHORT TERM GOAL #1   Title Pt will verbalize and demonstrate how to implement 2 memory and attention strategies to aid daily functioning given occasional min A over 2 sessions    Status Achieved      SLP SHORT TERM GOAL #2   Title Pt will manage finances with use of compensations with no reported episodes of missed bills given rare min A    Status Achieved      SLP SHORT TERM GOAL #3   Title Pt will identify errors in writing and self-correct with 80% accuracy given rare min A over 2 sessions    Status Achieved      SLP SHORT TERM GOAL #4   Title Pt will participate in further language assessment as needed (goals to be added)    Status Achieved              SLP Long Term Goals - 03/26/21 1247       SLP LONG TERM GOAL #1   Title Pt will verbalize and implement 4 attention and memory strategies to aid daily functioning for ADLs and IADLs with rare min A over 2 sessions    Time 12    Period Weeks    Status On-going    Target Date 06/24/21      SLP LONG TERM GOAL #2   Title Pt will identify and correct paraphasias in conversation and writing with 90% accuracy given rare min A over 2 sessions    Time 12    Period Weeks    Status Achieved    Target Date 06/24/21      SLP LONG TERM GOAL #3   Title Pt will use compensations for thought organization effectively in 20 minutes mod complex conversation.    Time 12    Period Weeks    Status On-going    Target Date 06/24/21       SLP LONG TERM GOAL #4   Title Patient will maintain alternating attention between 2 mod complex tasks for 15 minutes with modified independence (strategies allowed).    Time 12    Period Weeks    Status New  SLP LONG TERM GOAL #5   Title Patient will use strategies and executive function aid to preplan and manage IADLs independently.    Time 12    Period Weeks    Status New              Plan - 03/26/21 1142     Clinical Impression Statement Patient presents with mild cognitive communication impairment. Pt continues to use her calendar. Pt demonstrating increased attention to detail during today's tasks. Pt gradually implementing strategies to aid in ADLs and IADLs. Pt benefits from occasional cues for consistent usuage of memory/attention strategies. Patient missed several weeks of therapy due to illness; recertification completed today with goals updated. Patient has met 4/4 STGs and 1 LTG. I recommend skilled ST to maximize pt's cognitive communication and language skills to improve accuracy/independence with ADLs and IADLs, reduce communication breakdowns, and improve quality of life.    Speech Therapy Frequency 2x / week    Duration 12 weeks    Treatment/Interventions Language facilitation;Environmental controls;SLP instruction and feedback;Cognitive reorganization;Compensatory strategies;Patient/family education;Internal/external aids    Potential to Achieve Goals Good    SLP Home Exercise Plan see pt instructions    Consulted and Agree with Plan of Care Patient            Patient will benefit from skilled therapeutic intervention in order to improve the following deficits and impairments:   Cognitive communication deficit    Problem List Patient Active Problem List   Diagnosis Date Noted   Acute stroke due to ischemia ([place]) 08/10/2020   History of total hip replacement, right 09/10/2019   Special screening for malignant neoplasms, colon    Benign neoplasm of  ascending colon    Benign neoplasm of descending colon   Mickel Fuchs [place], [place] 03/26/2021, 12:52 PM  [place] [address] [place], [name], [address] Phone: [phone]   Fax:  [phone]   Name: JUNE RODE MRN: [id number] Date of Birth: 06-22-1955

## 2021-03-30 ENCOUNTER — Ambulatory Visit: Payer: No Typology Code available for payment source | Admitting: Physical Therapy

## 2021-03-30 ENCOUNTER — Ambulatory Visit: Payer: No Typology Code available for payment source | Admitting: Occupational Therapy

## 2021-03-31 ENCOUNTER — Ambulatory Visit: Payer: No Typology Code available for payment source

## 2021-03-31 ENCOUNTER — Encounter: Payer: No Typology Code available for payment source | Admitting: Occupational Therapy

## 2021-03-31 ENCOUNTER — Other Ambulatory Visit: Payer: Self-pay

## 2021-03-31 ENCOUNTER — Ambulatory Visit: Payer: No Typology Code available for payment source | Admitting: Speech Pathology

## 2021-03-31 DIAGNOSIS — M6281 Muscle weakness (generalized): Secondary | ICD-10-CM | POA: Diagnosis not present

## 2021-03-31 DIAGNOSIS — G8929 Other chronic pain: Secondary | ICD-10-CM

## 2021-03-31 DIAGNOSIS — M25611 Stiffness of right shoulder, not elsewhere classified: Secondary | ICD-10-CM

## 2021-03-31 DIAGNOSIS — R41841 Cognitive communication deficit: Secondary | ICD-10-CM

## 2021-03-31 NOTE — Therapy (Signed)
[place] [facility] [facility] ?[place], [name], [address] ?Phone: [phone]   Fax:  [phone] ? ?Physical Therapy Treatment ? ?Patient Details  ?Name: Kim Gomez ?MRN: [id number] ?Date of Birth: Mar 20, 1955 ?Referring Provider (PT): Jennings Books MD ? ? ?Encounter Date: 03/31/2021 ? ? PT End of Session - 03/31/21 1112   ? ? Visit Number 13   ? Number of Visits 24   ? Date for PT Re-Evaluation 05/18/21   ? PT Start Time 0802   ? PT Stop Time 7341   ? PT Time Calculation (min) 44 min   ? Activity Tolerance Patient limited by pain;Patient tolerated treatment well   ? Behavior During Therapy [facility] for tasks assessed/performed   ? ?  ?  ? ?  ? ? ?Past Medical History:  ?Diagnosis Date  ? Anxiety   ? Arthritis   ? legs, hands  ? Cellulitis of right knee   ? started on antibiotics 06/21/15.    ? Depression   ? Family history of adverse reaction to anesthesia   ? sister and mom -PONV  ? Hypercholesteremia   ? Hypertension   ? ? ?Past Surgical History:  ?Procedure Laterality Date  ? ABDOMINAL HYSTERECTOMY    ? COLONOSCOPY WITH PROPOFOL N/A 06/27/2015  ? Procedure: COLONOSCOPY WITH PROPOFOL;  Surgeon: Lucilla Lame, MD;  Location: [place];  Service: Endoscopy;  Laterality: N/A;  ? POLYPECTOMY  06/27/2015  ? Procedure: POLYPECTOMY;  Surgeon: Lucilla Lame, MD;  Location: [place];  Service: Endoscopy;;  ? TOTAL HIP ARTHROPLASTY Right 09/10/2019  ? Procedure: TOTAL HIP ARTHROPLASTY ANTERIOR APPROACH;  Surgeon: Lovell Sheehan, MD;  Location: [facility];  Service: Orthopedics;  Laterality: Right;  ? ? ?There were no vitals filed for this visit. ? ? Subjective Assessment - 03/31/21 0804   ? ? Subjective Pt reports R shoulder pain is currently a 3/10.   ? Patient is accompained by: Family member   ? 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 Pt reports a fall on  october 29th. Pt rpeorts she hurt the R UE with the fall and she reports some stiffness in the knee as well as general weakness on the right side. Pt reports she has had 2 strokes but the first went under the radar and was only evidenced by imaging presented to her by her MD. Pt reports she has been modifying her activities in order to be careful not to fall since the stroke. Reports she has been taking her time with a lot of things. Pt reports she has a membership at [employer] and she has been walking on the treadmill 2-3 times per week, she reports she walks on the treadmill for an hour at a time following working her way up. Pt reports ocassional numbness and tingling in her hands.   ? Limitations House hold activities   ? How long can you walk comfortably? 1 hour with UE support   ? Patient Stated Goals Improve her shoulder pain and function on the right side   ? Currently in Pain? Yes   ? Pain Score 3    ? Pain Location Shoulder   ? Pain Orientation Right   ? Pain Onset More than a month ago   ? ?  ?  ? ?  ? ? ?  ?INTERVENTIONS ?  ?Manual: ?Heat donned to R shoulder,  pt supine on plinth as PT provides the following interventions- ?PROM R shoulder: flexion, abduction, ER/IR x multiple reps of each with UE held at end range. PT holds R shoulder at end range for RUE flex/abd 1x30 sec for additional stretch.  ?PT provides STM and TrP release to R bicep, R posterior shoulder musculature, R upper trap  ?Heat removed. No adverse reaction to treatment observed or reported. Skin appears WNL. ?[place] joint distraction 3 x 20 sec  ?  ?Therex: ?Supine AROM: ?R shoulder flexion 10x with 1-2 sec holds at end range; performed with elbow flexed to reduce lever arm ?  ?PVC exercises: ?R shoulder abduction 10x ?Chest press 10x through limited ROM. Pt rates as medium ?R shoulder ER 12x  ?R shoulder flexion 15x through limited ROM. ?PT intermittently provides AAROM for pain modulation/comfort ?  ?Isometrics: RUE, each motion  performed 10x with 3 sec holds ?ER, IR, Extension, Flexion, Abduction, elbow flexion and extension  ?Comments: Pt rates as medium ? ?  ?Pt educated throughout session about proper posture and technique with exercises. Improved exercise technique, movement at target joints, use of target muscles after min to mod verbal, visual, tactile cues. ? ? ? ? PT Education - 03/31/21 1111   ? ? Education Details exercise technique, body mechanics   ? Person(s) Educated Patient   ? Methods Explanation;Demonstration;Tactile cues;Verbal cues   ? Comprehension Verbalized understanding;Returned demonstration;Verbal cues required;Need further instruction   ? ?  ?  ? ?  ? ? ? PT Short Term Goals - 03/23/21 1656   ? ?  ? PT SHORT TERM GOAL #1  ? Title Patient will be independent in home exercise program to improve strength/mobility for better functional independence with ADLs.   ? Baseline no HEP at this time   ? Time 4   ? Period Weeks   ? Status Achieved   ? Target Date 02/17/21   ?  ? PT SHORT TERM GOAL #2  ? Title Patient will be independent in progressive shoulder home exercise program in order to improve her shoulder strength and function   ? Baseline Patient has initial home exercise program and is comfortable with that but it is yet to be progressed for higher level activities   ? Time 4   ? Period Weeks   ? Status New   ? Target Date 04/20/21   ? ?  ?  ? ?  ? ? ? ? PT Long Term Goals - 03/23/21 1657   ? ?  ? PT LONG TERM GOAL #1  ? Title Patient will increase FOTO score to equal to or greater than  50   to demonstrate statistically significant improvement in mobility and quality of life.   ? Baseline 41 on 01/20/21   ? Time 8   ? Period Weeks   ? Status Achieved   ? Target Date 03/17/21   ?  ? PT LONG TERM GOAL #2  ? Title Pt will improve mini BEST test by 4 points or more in order to indicate clinically significant improvent in balance.   ? Baseline see flowsheets 27 on 2/27   ? Time 12   ? Period Weeks   ? Status Achieved   ?  Target Date 04/14/21   ?  ? PT LONG TERM GOAL #3  ? Title Patient  will complete five times sit to stand test in < 15 seconds indicating an increased LE strength and improved balance.   ? Baseline 13.9 sec  on 2/27   ? Time 12   ? Period Weeks   ? Status Achieved   ? Target Date 04/14/21   ?  ? PT LONG TERM GOAL #4  ? Title Patient will increase 10 meter walk test to >1.32ms as to improve gait speed for better community ambulation and to reduce fall risk.   ? Baseline .86 m/s on 01/20/21 2/27: 1.27 m/s   ? Time 8   ? Period Weeks   ? Status Achieved   ? Target Date 03/17/21   ?  ? PT LONG TERM GOAL #5  ? Title Pty will improve dual task TUG to less than 20 seconds in order to indicate improved cognitive and motor tasks.   ? Baseline 28.82 on 01/20/21 14.98 sec on 2/27   ? Time 12   ? Period Weeks   ? Status Achieved   ? Target Date 04/14/21   ?  ? Additional Long Term Goals  ? Additional Long Term Goals Yes   ?  ? PT LONG TERM GOAL #6  ? Title Patient will improve right shoulder by 1 increment on manual muscle test grading scale in order to indicate improved right upper extremity strength.   ? Baseline 3-/5 her right shoulder flexion and abduction(due to range of motion restriction) 3+ with external rotation at 0 degrees abduction   ? Time 8   ? Period Weeks   ? Status New   ? Target Date 05/18/21   ?  ? PT LONG TERM GOAL #7  ? Title Patient will improve QuickDASH score by 12.5 points in order to indicate significantly improved subjective rating of right shoulder function   ? Baseline 50% on 03/23/2021   ? Time 8   ? Period Weeks   ? Status New   ? Target Date 05/18/21   ?  ? PT LONG TERM GOAL #8  ? Title Patient will improve right shoulder active range of motion by 30 degrees with elevation and by 15 degrees with external rotation in order to indicate improved shoulder function for overhead activities and for self-care activities   ? Baseline 90 degrees flexion range of motion , 85 degrees abduction range of motion, 25  degrees external rotation range of motion ( all AROM)   ? Time 8   ? Period Weeks   ? Status New   ? Target Date 05/18/21   ?  ? PT LONG TERM GOAL  #9  ? TITLE Patient will improve right shoulder passive ra

## 2021-04-01 ENCOUNTER — Encounter: Payer: Self-pay | Admitting: Occupational Therapy

## 2021-04-01 ENCOUNTER — Ambulatory Visit: Payer: No Typology Code available for payment source | Admitting: Occupational Therapy

## 2021-04-01 ENCOUNTER — Ambulatory Visit: Payer: No Typology Code available for payment source | Admitting: Physical Therapy

## 2021-04-01 ENCOUNTER — Ambulatory Visit: Payer: No Typology Code available for payment source | Admitting: Speech Pathology

## 2021-04-01 DIAGNOSIS — M25611 Stiffness of right shoulder, not elsewhere classified: Secondary | ICD-10-CM

## 2021-04-01 DIAGNOSIS — R41841 Cognitive communication deficit: Secondary | ICD-10-CM

## 2021-04-01 DIAGNOSIS — G8929 Other chronic pain: Secondary | ICD-10-CM

## 2021-04-01 DIAGNOSIS — R278 Other lack of coordination: Secondary | ICD-10-CM

## 2021-04-01 DIAGNOSIS — M6281 Muscle weakness (generalized): Secondary | ICD-10-CM | POA: Diagnosis not present

## 2021-04-01 NOTE — Therapy (Signed)
[place] [facility] ?[place], [name], [address] ?Phone: [phone]   Fax:  [phone] ? ?Speech Language Pathology Treatment ? ?Patient Details  ?Name: Kim Gomez ?MRN: [id number] ?Date of Birth: 1955/04/07 ?Referring Provider (SLP): Dr. Jennings Books ? ? ?Encounter Date: 04/01/2021 ? ? End of Session - 04/01/21 1349   ? ? Visit Number 17   ? Number of Visits 25   ? Date for SLP Re-Evaluation 06/24/21   ? Authorization - Visit Number 7   ? Progress Note Due on Visit 10   ? SLP Start Time [phone]   ? SLP Stop Time  1000   ? SLP Time Calculation (min) 55 min   ? Activity Tolerance Patient tolerated treatment well   ? ?  ?  ? ?  ? ? ?Past Medical History:  ?Diagnosis Date  ? Anxiety   ? Arthritis   ? legs, hands  ? Cellulitis of right knee   ? started on antibiotics 06/21/15.    ? Depression   ? Family history of adverse reaction to anesthesia   ? sister and mom -PONV  ? Hypercholesteremia   ? Hypertension   ? ? ?Past Surgical History:  ?Procedure Laterality Date  ? ABDOMINAL HYSTERECTOMY    ? COLONOSCOPY WITH PROPOFOL N/A 06/27/2015  ? Procedure: COLONOSCOPY WITH PROPOFOL;  Surgeon: Lucilla Lame, MD;  Location: [place];  Service: Endoscopy;  Laterality: N/A;  ? POLYPECTOMY  06/27/2015  ? Procedure: POLYPECTOMY;  Surgeon: Lucilla Lame, MD;  Location: [place];  Service: Endoscopy;;  ? TOTAL HIP ARTHROPLASTY Right 09/10/2019  ? Procedure: TOTAL HIP ARTHROPLASTY ANTERIOR APPROACH;  Surgeon: Lovell Sheehan, MD;  Location: [facility];  Service: Orthopedics;  Laterality: Right;  ? ? ?There were no vitals filed for this visit. ? ? Subjective Assessment - 04/01/21 1343   ? ? Subjective Pt cooked dinner last night   ? Currently in Pain? No/denies   ? ?  ?  ? ?  ? ? ? ? ? ? ? ? ADULT SLP TREATMENT - 04/01/21 1343   ? ?  ? General Information  ? Behavior/Cognition Alert;Cooperative;Pleasant mood   ? HPI Patient is a 66 y.o. female who admitted 08/10/20  and found to have Covid-19 as well as left ischemic thalamic/internal capsule infarct. Acute SLP screened and s/o; pt now with residual cognitive fog, imbalance, fatigue, right first + second digit numbness.   ?  ? Cognitive-Linquistic Treatment  ? Treatment focused on Cognition;Patient/family/caregiver education   ? Skilled Treatment Targeted executive function with planning task: pt wrote plan for the week (cooking days, walk/gym days) with cues to initiate. Education provided on use of a routine to help with motivation and recall. Pt chose Wednesday as her planning day. Encouraged pt to use this day to write down her meal plan for next 2 meals, schedule gym, walks, and grocery trips. Pt with hesitation/freezing when attempting to generate plan for dinner. Created section in her notebook to list quick/easy meals and ingredients to assist her with making her meal plan and grocery list. Pt listed 3 simple meals and ingredients with min question cues. Pt to review recipes at home and add additional meals to her quick meals list. Targeted thought organization with guided reading activity re: topic of pt's choosing (emotional changes after stroke). Pt read article and with min cues, re-reading selected personally relevant details from the article. Home task to write responses to questions to  help organize her thoughts for further discussion with family members/medical providers.   ?  ? Assessment / Recommendations / Plan  ? Plan Continue with current plan of care   ?  ? Progression Toward Goals  ? Progression toward goals Progressing toward goals   ? ?  ?  ? ?  ? ? ? ? ? SLP Short Term Goals - 03/26/21 1247   ? ?  ? SLP SHORT TERM GOAL #1  ? Title Pt will verbalize and demonstrate how to implement 2 memory and attention strategies to aid daily functioning given occasional min A over 2 sessions   ? Status Achieved   ?  ? SLP SHORT TERM GOAL #2  ? Title Pt will manage finances with use of compensations with no reported  episodes of missed bills given rare min A   ? Status Achieved   ?  ? SLP SHORT TERM GOAL #3  ? Title Pt will identify errors in writing and self-correct with 80% accuracy given rare min A over 2 sessions   ? Status Achieved   ?  ? SLP SHORT TERM GOAL #4  ? Title Pt will participate in further language assessment as needed (goals to be added)   ? Status Achieved   ? ?  ?  ? ?  ? ? ? SLP Long Term Goals - 03/26/21 1247   ? ?  ? SLP LONG TERM GOAL #1  ? Title Pt will verbalize and implement 4 attention and memory strategies to aid daily functioning for ADLs and IADLs with rare min A over 2 sessions   ? Time 12   ? Period Weeks   ? Status On-going   ? Target Date 06/24/21   ?  ? SLP LONG TERM GOAL #2  ? Title Pt will identify and correct paraphasias in conversation and writing with 90% accuracy given rare min A over 2 sessions   ? Time 12   ? Period Weeks   ? Status Achieved   ? Target Date 06/24/21   ?  ? SLP LONG TERM GOAL #3  ? Title Pt will use compensations for thought organization effectively in 20 minutes mod complex conversation.   ? Time 12   ? Period Weeks   ? Status On-going   ? Target Date 06/24/21   ?  ? SLP LONG TERM GOAL #4  ? Title Patient will maintain alternating attention between 2 mod complex tasks for 15 minutes with modified independence (strategies allowed).   ? Time 12   ? Period Weeks   ? Status New   ?  ? SLP LONG TERM GOAL #5  ? Title Patient will use strategies and executive function aid to preplan and manage IADLs independently.   ? Time 12   ? Period Weeks   ? Status New   ? ?  ?  ? ?  ? ? ? Plan - 04/01/21 1349   ? ? Clinical Impression Statement Patient presents with mild cognitive communication impairment. Pt reports carryover of strategies to assist with preparing meals at home. Continues to require cues for anticipatory awareness to identify additional opportunities to use trained strategies to improve function; suggested making routine for planning her week. Plan to continue to target  conversation pre-planning to aid pt in verbally expressing her cognitive and emotional changes post-CVA to family members.  I recommend skilled ST to maximize pt's cognitive communication and language skills to improve accuracy/independence with ADLs and IADLs, reduce communication breakdowns,  and improve quality of life.   ? Speech Therapy Frequency 2x / week   ? Duration 12 weeks   ? Treatment/Interventions Language facilitation;Environmental controls;SLP instruction and feedback;Cognitive reorganization;Compensatory strategies;Patient/family education;Internal/external aids   ? Potential to Achieve Goals Good   ? SLP Home Exercise Plan see pt instructions   ? Consulted and Agree with Plan of Care Patient   ? ?  ?  ? ?  ? ? ?Patient will benefit from skilled therapeutic intervention in order to improve the following deficits and impairments:   ?Cognitive communication deficit ? ? ? ?Problem List ?Patient Active Problem List  ? Diagnosis Date Noted  ? Acute stroke due to ischemia ([place]) 08/10/2020  ? 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 Special screening for malignant neoplasms, colon   ? Benign neoplasm of ascending colon   ? Benign neoplasm of descending colon   ? ?Deneise Lever, MS, CCC-SLP ?Speech-Language Pathologist ?[phone] ? ?Aliene Altes, CCC-SLP ?04/01/2021, 1:51 PM ? ?[place] ?[facility] ?[place], [name], [address] ?Phone: [phone]   Fax:  [phone] ? ? ?Name: Kim Gomez ?MRN: [id number] ?Date of Birth: [birth year] ? ?

## 2021-04-01 NOTE — Patient Instructions (Addendum)
Homework: ? ?Add a few more "quick and easy" recipes to your list in your notebook. Remember to write down the main ingredients. It's OK to look through any recipe books or cards you have to jog your memory and get some ideas. The goal is for you to have a quick place to get ideas if you are trying to come up with an idea of what to cook. This will also help you when you make your grocery list. ? ?I think it's great that you are creating a "routine" around what days you cook and what days you go to the gym. This will help add some rhythm to your week.  ? ?Every Wednesday! Plan and write on your calendar ?Plan what you are going to cook on Saturday and the next Tuesday.  ?Plan when you will go to the grocery store if you need to.  ?Plan when you will walk/go to gym.  ? ? ?

## 2021-04-01 NOTE — Therapy (Signed)
[facility] ?[facility] ?[place], [name], [address] ?Phone: [phone]   Fax:  [phone] ? ?Speech Language Pathology Treatment ? ?Patient Details  ?Name: Kim Gomez ?MRN: [id number] ?Date of Birth: 1955-10-25 ?Referring Provider (SLP): Dr. Jennings Books ? ? ?Encounter Date: 03/31/2021 ? ? End of Session - 03/31/21 1225   ? ? Visit Number 16   ? Number of Visits 25   ? Date for SLP Re-Evaluation 06/24/21   ? Authorization - Visit Number 6   ? Progress Note Due on Visit 10   ? Activity Tolerance Patient tolerated treatment well   ? ?  ?  ? ?  ? ? ?Past Medical History:  ?Diagnosis Date  ? Anxiety   ? Arthritis   ? legs, hands  ? Cellulitis of right knee   ? started on antibiotics 06/21/15.    ? Depression   ? Family history of adverse reaction to anesthesia   ? sister and mom -PONV  ? Hypercholesteremia   ? Hypertension   ? ? ?Past Surgical History:  ?Procedure Laterality Date  ? ABDOMINAL HYSTERECTOMY    ? COLONOSCOPY WITH PROPOFOL N/A 06/27/2015  ? Procedure: COLONOSCOPY WITH PROPOFOL;  Surgeon: Lucilla Lame, MD;  Location: [facility];  Service: Endoscopy;  Laterality: N/A;  ? POLYPECTOMY  06/27/2015  ? Procedure: POLYPECTOMY;  Surgeon: Lucilla Lame, MD;  Location: [facility];  Service: Endoscopy;;  ? TOTAL HIP ARTHROPLASTY Right 09/10/2019  ? Procedure: TOTAL HIP ARTHROPLASTY ANTERIOR APPROACH;  Surgeon: Lovell Sheehan, MD;  Location: [facility];  Service: Orthopedics;  Laterality: Right;  ? ? ?There were no vitals filed for this visit. ? ? ? ? ? ? ? ? ? ADULT SLP TREATMENT - 03/31/21 1211   ? ?  ? General Information  ? Behavior/Cognition Alert;Cooperative;Pleasant mood   ? HPI Patient is a 66 y.o. female who admitted 08/10/20 and found to have Covid-19 as well as left ischemic thalamic/internal capsule infarct. Acute SLP screened and s/o; pt now with residual cognitive fog, imbalance, fatigue, right first + second digit numbness.   ?  ?  Treatment Provided  ? Treatment provided Cognitive-Linquistic   ?  ? Cognitive-Linquistic Treatment  ? Treatment focused on Cognition;Patient/family/caregiver education   ? Skilled Treatment Pt referenced her calendar for recall of planned/completed activities since last session. Reports she achieved her goals of cooking 2 days, going to the gym, and walking during previous week (recognized need to check off on her tracker). Reports preparing 3 items for Sunday meal and used strategies of preparing all her supplies in advance; was successful at having all items ready at the same time. Targeted executive function with scheduling task; pt reviewed calendar and made decisions re: appointment options/changes with occasional min-mod A. Targeted mod-complex conversation re:  changes since CVA; pt required extended time and occasional question cues for expression. She reports having a difficult time expressing the changes she feels to others including sisters and spouse.   ?  ? Assessment / Recommendations / Plan  ? Plan Continue with current plan of care   ?  ? Progression Toward Goals  ? Progression toward goals Progressing toward goals   ? ?  ?  ? ?  ? ? ? SLP Education - 03/31/21 1216   ? ? Education Details stroke support group details, self-advocacy   ? Person(s) Educated Patient   ? Methods Explanation   ? Comprehension Verbalized understanding   ? ?  ?  ? ?  ? ? ?  SLP Short Term Goals - 03/26/21 1247   ? ?  ? SLP SHORT TERM GOAL #1  ? Title Pt will verbalize and demonstrate how to implement 2 memory and attention strategies to aid daily functioning given occasional min A over 2 sessions   ? Status Achieved   ?  ? SLP SHORT TERM GOAL #2  ? Title Pt will manage finances with use of compensations with no reported episodes of missed bills given rare min A   ? Status Achieved   ?  ? SLP SHORT TERM GOAL #3  ? Title Pt will identify errors in writing and self-correct with 80% accuracy given rare min A over 2 sessions   ?  Status Achieved   ?  ? SLP SHORT TERM GOAL #4  ? Title Pt will participate in further language assessment as needed (goals to be added)   ? Status Achieved   ? ?  ?  ? ?  ? ? ? SLP Long Term Goals - 03/26/21 1247   ? ?  ? SLP LONG TERM GOAL #1  ? Title Pt will verbalize and implement 4 attention and memory strategies to aid daily functioning for ADLs and IADLs with rare min A over 2 sessions   ? Time 12   ? Period Weeks   ? Status On-going   ? Target Date 06/24/21   ?  ? SLP LONG TERM GOAL #2  ? Title Pt will identify and correct paraphasias in conversation and writing with 90% accuracy given rare min A over 2 sessions   ? Time 12   ? Period Weeks   ? Status Achieved   ? Target Date 06/24/21   ?  ? SLP LONG TERM GOAL #3  ? Title Pt will use compensations for thought organization effectively in 20 minutes mod complex conversation.   ? Time 12   ? Period Weeks   ? Status On-going   ? Target Date 06/24/21   ?  ? SLP LONG TERM GOAL #4  ? Title Patient will maintain alternating attention between 2 mod complex tasks for 15 minutes with modified independence (strategies allowed).   ? Time 12   ? Period Weeks   ? Status New   ?  ? SLP LONG TERM GOAL #5  ? Title Patient will use strategies and executive function aid to preplan and manage IADLs independently.   ? Time 12   ? Period Weeks   ? Status New   ? ?  ?  ? ?  ? ? ? Plan - 03/31/21 1217   ? ? Clinical Impression Statement Patient presents with mild cognitive communication impairment. Pt reports carryover of strategies to assist with preparing meals at home. Continues to require cues for anticipatory awareness to identify additional opportunities to use trained strategies to improve function. Verbalized difficulty today with verbally expressing her cognitive changes post-CVA to family members.  I recommend skilled ST to maximize pt's cognitive communication and language skills to improve accuracy/independence with ADLs and IADLs, reduce communication breakdowns, and  improve quality of life.   ? Speech Therapy Frequency 2x / week   ? Duration 12 weeks   ? Treatment/Interventions Language facilitation;Environmental controls;SLP instruction and feedback;Cognitive reorganization;Compensatory strategies;Patient/family education;Internal/external aids   ? Potential to Achieve Goals Good   ? SLP Home Exercise Plan see pt instructions   ? Consulted and Agree with Plan of Care Patient   ? ?  ?  ? ?  ? ? ?Patient will  benefit from skilled therapeutic intervention in order to improve the following deficits and impairments:   ?Cognitive communication deficit ? ? ? ?Problem List ?Patient Active Problem List  ? Diagnosis Date Noted  ? Acute stroke due to ischemia ([facility]) 08/10/2020  ? 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 Special screening for malignant neoplasms, colon   ? Benign neoplasm of ascending colon   ? Benign neoplasm of descending colon   ? ?Deneise Lever, MS, CCC-SLP ?Speech-Language Pathologist ?[phone] ? ?Aliene Altes, CCC-SLP ?04/01/2021, 8:34 AM ? ?[facility] ?[place], [name], [address] ?Phone: [phone]   Fax:  [phone] ? ? ?Name: Kim Gomez ?MRN: [id number] ?Date of Birth: 1955-03-28 ? ?

## 2021-04-01 NOTE — Therapy (Signed)
[facility] ?[facility] ?[place], [name], [address] ?Phone: [phone]   Fax:  [phone] ? ?Occupational Therapy Treatment ? ?Patient Details  ?Name: Kim Gomez ?MRN: [id number] ?Date of Birth: 12-21-1955 ?Referring Provider (OT): Manuella Ghazi, H ? ? ?Encounter Date: 04/01/2021 ? ? OT End of Session - 04/05/21 1235   ? ? Visit Number 12   ? Number of Visits 24   ? Date for OT Re-Evaluation 04/25/21   ? OT Start Time 1012   ? OT Stop Time 1100   ? OT Time Calculation (min) 48 min   ? Activity Tolerance Patient limited by pain   ? Behavior During Therapy [facility] for tasks assessed/performed   ? ?  ?  ? ?  ? ? ?Past Medical History:  ?Diagnosis Date  ? Anxiety   ? Arthritis   ? legs, hands  ? Cellulitis of right knee   ? started on antibiotics 06/21/15.    ? Depression   ? Family history of adverse reaction to anesthesia   ? sister and mom -PONV  ? Hypercholesteremia   ? Hypertension   ? ? ?Past Surgical History:  ?Procedure Laterality Date  ? ABDOMINAL HYSTERECTOMY    ? COLONOSCOPY WITH PROPOFOL N/A 06/27/2015  ? Procedure: COLONOSCOPY WITH PROPOFOL;  Surgeon: Lucilla Lame, MD;  Location: [facility];  Service: Endoscopy;  Laterality: N/A;  ? POLYPECTOMY  06/27/2015  ? Procedure: POLYPECTOMY;  Surgeon: Lucilla Lame, MD;  Location: [facility];  Service: Endoscopy;;  ? TOTAL HIP ARTHROPLASTY Right 09/10/2019  ? Procedure: TOTAL HIP ARTHROPLASTY ANTERIOR APPROACH;  Surgeon: Lovell Sheehan, MD;  Location: [facility];  Service: Orthopedics;  Laterality: Right;  ? ? ?There were no vitals filed for this visit. ? ? Subjective Assessment - 04/05/21 1232   ? ? Subjective  Pt reports pain in shoulder yesterday but feels better today but still feels tender.   ? Pertinent History Kim Gomez is a 66 y.o. female with PMH significant for HTN, HLD, anxiety disorder, recently hospitalized for COVID-19 infection (08/06/2020) at an outside facility. Patient was brought  to the ED on 7/24 after she had a sudden onset of numbness involving right side of her body without any motor deficits, dysarthria, dysphagia. 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 Per chart for her f/u neuro visit:Left ischemic thalamic/internal capsule infarct presenting with right-sided numbness, weakness - now with residual cognitive fog (improving), imbalance (improving), fatigue, right first + second digit numbness, in patient with known vascular risk factors of hypertension, dyslipidemia, lack of physical activity, snoring   ? Patient Stated Goals Pt reports she wants to get back to doing things more independently like she was before COVID and CVA.   ? Currently in Pain? Yes   ? Pain Score 2    ? Pain Location Shoulder   ? Pain Orientation Right   ? Pain Descriptors / Indicators Tender   ? Pain Type Chronic pain   ? Pain Onset More than a month ago   ? Pain Frequency Intermittent   ? Multiple Pain Sites No   ? ?  ?  ? ?  ? ? ?Pt reports tenderness in right shoulder with reaching, is planning to go this week for an injection.  Moist Heat applied to right shoulder for 5 mins prior to neuro reed ?Neuroreed: ?Pt seen for manipulation of small toothpicks to pick up and place into  a small holed container working on coordination and precision of movements.  Manipulation of small ? inch square bead/blocks to place into small glass bottle, occasional cues for prehension patterns.  Manipulation of small perler beads to place into small grid and remove with tweezers for pinch, 2 levels of pinch performed with 2 types of tweezers.  Pt continues to work on speed, dexterity and precision of tasks.  Pt performing unknotting with medium nylon rope with use of bilateral UEs with cues for right hand to lead the task.   ? ? ?Response to tx: ?Pt continues to progress, pt worked shoulder with PT in previous sessions and has some tenderness this date, focused  efforts on coordination tasks which also requires reaching at tabletop height this date.  Pt improving with manipulation skills in the clinic, moving towards higher level tasks involving using the hand for storage and translatory skills of the hand as well as decreasing the size of items she is working towards manipulating.  Continue to work towards goals in plan of care to improve strength, ROM, decrease pain and increase ability to perform daily tasks with use of right UE.   ? ? ? ? ? ? ? ? ? ? ? ? ? ? ? ? ? ? OT Education - 04/05/21 1235   ? ? Education Details shoulder massage, coordination skills   ? Person(s) Educated Patient   ? Methods Explanation   ? Comprehension Verbalized understanding;Returned demonstration;Verbal cues required;Need further instruction   ? ?  ?  ? ?  ? ? ? ? ? ? OT Long Term Goals - 03/23/21 1723   ? ?  ? OT LONG TERM GOAL #1  ? Title Patient will be independent with home exercise program   ? Baseline 03/23/2021: Pt. is independent.   ? Time 12   ? Period Weeks   ? Status On-going   ? Target Date 04/25/21   ?  ? OT LONG TERM GOAL #2  ? Title Patient will complete basic self-care tasks with modified independence and without increased time to complete tasks   ? Baseline Patient has difficulty with washing her back.  Pt. requires increased time to complete self-care tasks.   ? Time 6   ? Period Weeks   ? Status Partially Met   ? Target Date 04/25/21   ?  ? OT LONG TERM GOAL #3  ? Title Patient will demonstrate good safety awareness in the kitchen with cooking tasks with supervision   ? Baseline Pt. requires Supervision for safety awareness.   ? Time 12   ? Period Weeks   ? Status On-going   ? Target Date 04/25/21   ?  ? OT LONG TERM GOAL #4  ? Title Patient will increase right grip strength by 10 pounds to be able to open jars and containers with modified independence   ? Baseline 28 pounds at eval and difficulty with managing jars and containers. 10th visit: R: 34# Jars,a nd containers  continue to be difficulty to open.   ? Time 12   ? Period Weeks   ? Status Partially Met   ? Target Date 04/25/21   ?  ? OT LONG TERM GOAL #5  ? Title Patient will improve right pinch strength by 5 pounds to pick up and manipulate items with modified independence   ? Baseline Right lateral 5 pounds, 3 point 4#, 10th visit: right lateral pinch 13#   ? Time 12   ? Period Weeks   ?  Status Partially Met   ? Target Date 04/25/21   ?  ? OT LONG TERM GOAL #6  ? Title Patient will decrease right shoulder pain to 2 out of 10 or less with movement patterns for daily activities.   ? Baseline Increased pain in right shoulder, 5 out of 10 with movement. 10th visit: 8/10 right shoulder pain   ? Time 12   ? Period Weeks   ? Status New   ? Target Date 04/25/21   ?  ? OT LONG TERM GOAL #7  ? Title Patient will demonstrate score of 59 or greater on FOTO to show a clinically relevant change to impact self-care activities with greater independence   ? Baseline Score of 55 at eval, 10th visit: FOTO score: 51, increased shoulder  pain limiting overhead activity.   ? Time 12   ? Period Weeks   ? Status On-going   ? Target Date 04/25/21   ?  ? OT LONG TERM GOAL #8  ? Title Will assess for potential return to work tasks as patient progresses with therapy   ? Baseline Patient currently unable to perform her job as outlined in her job description   ? Time 12   ? Period Weeks   ? Status On-going   ? Target Date 04/25/21   ? ?  ?  ? ?  ? ? ? ? ? ? ? ? Plan - 04/05/21 1235   ? ? Clinical Impression Statement Pt continues to progress, pt worked shoulder with PT in previous sessions and has some tenderness this date, focused efforts on coordination tasks which also requires reaching at tabletop height this date.  Pt improving with manipulation skills in the clinic, moving towards higher level tasks involving using the hand for storage and translatory skills of the hand as well as decreasing the size of items she is working towards manipulating.   Continue to work towards goals in plan of care to improve strength, ROM, decrease pain and increase ability to perform daily tasks with use of right UE.   ? OT Occupational Profile and History Problem Focused

## 2021-04-02 ENCOUNTER — Encounter: Payer: No Typology Code available for payment source | Admitting: Occupational Therapy

## 2021-04-02 ENCOUNTER — Encounter: Payer: No Typology Code available for payment source | Admitting: Speech Pathology

## 2021-04-06 ENCOUNTER — Other Ambulatory Visit: Payer: Self-pay

## 2021-04-06 ENCOUNTER — Ambulatory Visit: Payer: No Typology Code available for payment source | Admitting: Occupational Therapy

## 2021-04-06 ENCOUNTER — Encounter: Payer: Self-pay | Admitting: Occupational Therapy

## 2021-04-06 DIAGNOSIS — M6281 Muscle weakness (generalized): Secondary | ICD-10-CM | POA: Diagnosis not present

## 2021-04-06 DIAGNOSIS — M25611 Stiffness of right shoulder, not elsewhere classified: Secondary | ICD-10-CM

## 2021-04-06 DIAGNOSIS — G8929 Other chronic pain: Secondary | ICD-10-CM

## 2021-04-06 DIAGNOSIS — R278 Other lack of coordination: Secondary | ICD-10-CM

## 2021-04-06 NOTE — Therapy (Signed)
[facility] ?[facility] ?[place], [name], [address] ?Phone: [phone]   Fax:  [phone] ? ?Occupational Therapy Treatment ? ?Patient Details  ?Name: Kim Gomez ?MRN: [id number] ?Date of Birth: [birth year] ?Referring Provider (OT): Manuella Ghazi, H ? ? ?Encounter Date: 04/06/2021 ? ? OT End of Session - 04/06/21 1245   ? ? Visit Number 13   ? Number of Visits 24   ? Date for OT Re-Evaluation 04/25/21   ? OT Start Time 0845   ? OT Stop Time 0930   ? OT Time Calculation (min) 45 min   ? Activity Tolerance Patient limited by pain   ? Behavior During Therapy [facility] for tasks assessed/performed   ? ?  ?  ? ?  ? ? ?Past Medical History:  ?Diagnosis Date  ? Anxiety   ? Arthritis   ? legs, hands  ? Cellulitis of right knee   ? started on antibiotics 06/21/15.    ? Depression   ? Family history of adverse reaction to anesthesia   ? sister and mom -PONV  ? Hypercholesteremia   ? Hypertension   ? ? ?Past Surgical History:  ?Procedure Laterality Date  ? ABDOMINAL HYSTERECTOMY    ? COLONOSCOPY WITH PROPOFOL N/A 06/27/2015  ? Procedure: COLONOSCOPY WITH PROPOFOL;  Surgeon: Lucilla Lame, MD;  Location: [facility];  Service: Endoscopy;  Laterality: N/A;  ? POLYPECTOMY  06/27/2015  ? Procedure: POLYPECTOMY;  Surgeon: Lucilla Lame, MD;  Location: [facility];  Service: Endoscopy;;  ? TOTAL HIP ARTHROPLASTY Right 09/10/2019  ? Procedure: TOTAL HIP ARTHROPLASTY ANTERIOR APPROACH;  Surgeon: Lovell Sheehan, MD;  Location: [facility];  Service: Orthopedics;  Laterality: Right;  ? ? ?There were no vitals filed for this visit. ? ? Subjective Assessment - 04/06/21 1238   ? ? Subjective  Pt reports she got a shot in her right shoulder last week and it is feeling better already.  Denies any pain this date.   ? Pertinent History Kim Gomez is a 66 y.o. female with PMH significant for HTN, HLD, anxiety disorder, recently hospitalized for COVID-19 infection (08/06/2020) at an  outside facility. Patient was brought to the ED on 7/24 after she had a sudden onset of numbness involving right side of her body without any motor deficits, dysarthria, dysphagia. 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 Per chart for her f/u neuro visit:Left ischemic thalamic/internal capsule infarct presenting with right-sided numbness, weakness - now with residual cognitive fog (improving), imbalance (improving), fatigue, right first + second digit numbness, in patient with known vascular risk factors of hypertension, dyslipidemia, lack of physical activity, snoring   ? Patient Stated Goals Pt reports she wants to get back to doing things more independently like she was before COVID and CVA.   ? Currently in Pain? No/denies   ? Pain Score 0-No pain   ? ?  ?  ? ?  ? ? ?Therapeutic Exercise: ? ?UBE 2.0 for 8 mins, forwards and backwards, alternating levels of resistance, therapist in constant attendance to ensure grip and to adjust settings.   ?Grip with use of resistive green putty, pinches-lateral, 3 point and 2 point.  Rolling putty lengthwise for pinches and cues for form and technique. ?Manipulation of small pegs out of putty for finger strengthening for 20 reps. ?Resistive pinch with 5 levels of pins, placing onto small dowels in vertical position encouraging reach with right shoulder.  Able to perform all levels of pinch but reports some fatigue with right arm after multiple repetitions.   ?  ?Therapist performing massage to right shoulder, bicep and forearm after exercises with pt reporting some slightly increased discomfort after reaching tasks.   ? ?Response to tx: ?Pt denies any shoulder pain on arrival and reports she received an injection in the shoulder last week. With use of right arm during session, she reported some slight discomfort and fatigue in the arm but not described as pain and relieved with massage to area.  Pt able to  demonstrate use of putty for grip and pinch this date with cues for proper form and technique.  Able to complete all 5 levels of pinch but demonstrated hand fatigue after multiple repetitions.  Will continue to monitor pain in right arm with activity.  Continue to work towards improving right UE function for use in ADL and IADL tasks at home and in the community.   ? ? ? ? ? ? ? ? ? ? ? ? ? ? ? ? ? ? ? ? OT Education - 04/06/21 1244   ? ? Education Details fine motor coordination and manipulation skills   ? Person(s) Educated Patient   ? Methods Explanation   ? Comprehension Verbalized understanding;Returned demonstration;Verbal cues required;Need further instruction   ? ?  ?  ? ?  ? ? ? ? ? ? OT Long Term Goals - 03/23/21 1723   ? ?  ? OT LONG TERM GOAL #1  ? Title Patient will be independent with home exercise program   ? Baseline 03/23/2021: Pt. is independent.   ? Time 12   ? Period Weeks   ? Status On-going   ? Target Date 04/25/21   ?  ? OT LONG TERM GOAL #2  ? Title Patient will complete basic self-care tasks with modified independence and without increased time to complete tasks   ? Baseline Patient has difficulty with washing her back.  Pt. requires increased time to complete self-care tasks.   ? Time 6   ? Period Weeks   ? Status Partially Met   ? Target Date 04/25/21   ?  ? OT LONG TERM GOAL #3  ? Title Patient will demonstrate good safety awareness in the kitchen with cooking tasks with supervision   ? Baseline Pt. requires Supervision for safety awareness.   ? Time 12   ? Period Weeks   ? Status On-going   ? Target Date 04/25/21   ?  ? OT LONG TERM GOAL #4  ? Title Patient will increase right grip strength by 10 pounds to be able to open jars and containers with modified independence   ? Baseline 28 pounds at eval and difficulty with managing jars and containers. 10th visit: R: 34# Jars,a nd containers continue to be difficulty to open.   ? Time 12   ? Period Weeks   ? Status Partially Met   ? Target  Date 04/25/21   ?  ? OT LONG TERM GOAL #5  ? Title Patient will improve right pinch strength by 5 pounds to pick up and manipulate items with modified independence   ? Baseline Right lateral 5 pounds, 3 point 4#, 10th visit: right lateral pinch 13#   ? Time 12   ? Period Weeks   ? Status Partially Met   ? Target Date 04/25/21   ?  ? OT LONG TERM GOAL #6  ? Title Patient will decrease right  shoulder pain to 2 out of 10 or less with movement patterns for daily activities.   ? Baseline Increased pain in right shoulder, 5 out of 10 with movement. 10th visit: 8/10 right shoulder pain   ? Time 12   ? Period Weeks   ? Status New   ? Target Date 04/25/21   ?  ? OT LONG TERM GOAL #7  ? Title Patient will demonstrate score of 59 or greater on FOTO to show a clinically relevant change to impact self-care activities with greater independence   ? Baseline Score of 55 at eval, 10th visit: FOTO score: 51, increased shoulder  pain limiting overhead activity.   ? Time 12   ? Period Weeks   ? Status On-going   ? Target Date 04/25/21   ?  ? OT LONG TERM GOAL #8  ? Title Will assess for potential return to work tasks as patient progresses with therapy   ? Baseline Patient currently unable to perform her job as outlined in her job description   ? Time 12   ? Period Weeks   ? Status On-going   ? Target Date 04/25/21   ? ?  ?  ? ?  ? ? ? ? ? ? ? ? Plan - 04/06/21 1245   ? ? Clinical Impression Statement Pt denies any shoulder pain on arrival and reports she received an injection in the shoulder last week. With use of right arm during session, she reported some slight discomfort and fatigue in the arm but not described as pain and relieved with massage to area.  Pt able to demonstrate use of putty for grip and pinch this date with cues for proper form and technique.  Able to complete all 5 levels of pinch but demonstrated hand fatigue after multiple repetitions.  Will continue to monitor pain in right arm with activity.  Continue to work  towards improving right UE function for use in ADL and IADL tasks at home and in the community.   ? OT Occupational Profile and History Problem Focused Assessment - Including review of records relating to presenti

## 2021-04-07 ENCOUNTER — Ambulatory Visit: Payer: No Typology Code available for payment source | Admitting: Speech Pathology

## 2021-04-07 DIAGNOSIS — R41841 Cognitive communication deficit: Secondary | ICD-10-CM

## 2021-04-07 DIAGNOSIS — M6281 Muscle weakness (generalized): Secondary | ICD-10-CM | POA: Diagnosis not present

## 2021-04-07 NOTE — Therapy (Signed)
[name] ?[name], [name], [address] ?Phone: [phone]   Fax:  [phone] ? ?Speech Language Pathology Treatment ? ?Patient Details  ?Name: Kim Gomez ?MRN: [id number] ?Date of Birth: Nov 18, 1955 ?Referring Provider (SLP): Dr. Jennings Books ? ? ?Encounter Date: 04/07/2021 ? ? End of Session - 04/07/21 1349   ? ? Visit Number 18   ? Number of Visits 25   ? Date for SLP Re-Evaluation 06/24/21   ? Authorization - Visit Number 8   ? Progress Note Due on Visit 10   ? SLP Start Time 1000   ? SLP Stop Time  1055   ? SLP Time Calculation (min) 55 min   ? Activity Tolerance Patient tolerated treatment well   ? ?  ?  ? ?  ? ? ?Past Medical History:  ?Diagnosis Date  ? Anxiety   ? Arthritis   ? legs, hands  ? Cellulitis of right knee   ? started on antibiotics 06/21/15.    ? Depression   ? Family history of adverse reaction to anesthesia   ? sister and mom -PONV  ? Hypercholesteremia   ? Hypertension   ? ? ?Past Surgical History:  ?Procedure Laterality Date  ? ABDOMINAL HYSTERECTOMY    ? COLONOSCOPY WITH PROPOFOL N/A 06/27/2015  ? Procedure: COLONOSCOPY WITH PROPOFOL;  Surgeon: Lucilla Lame, MD;  Location: [facility];  Service: Endoscopy;  Laterality: N/A;  ? POLYPECTOMY  06/27/2015  ? Procedure: POLYPECTOMY;  Surgeon: Lucilla Lame, MD;  Location: [facility];  Service: Endoscopy;;  ? TOTAL HIP ARTHROPLASTY Right 09/10/2019  ? Procedure: TOTAL HIP ARTHROPLASTY ANTERIOR APPROACH;  Surgeon: Lovell Sheehan, MD;  Location: [facility];  Service: Orthopedics;  Laterality: Right;  ? ? ?There were no vitals filed for this visit. ? ? Subjective Assessment - 04/07/21 1350   ? ? Subjective "I am dealing with a lot"   ? Currently in Pain? No/denies   ? Pain Score 0-No pain   ? ?  ?  ? ?  ? ? ? ? ? ? ? ? ADULT SLP TREATMENT - 04/07/21 1308   ? ?  ? General Information  ? Behavior/Cognition Alert;Cooperative;Pleasant mood   ? HPI Patient is a 66 y.o.  female who admitted 08/10/20 and found to have Covid-19 as well as left ischemic thalamic/internal capsule infarct. Acute SLP screened and s/o; pt now with residual cognitive fog, imbalance, fatigue, right first + second digit numbness.   ?  ? Treatment Provided  ? Treatment provided Cognitive-Linquistic   ?  ? Cognitive-Linquistic Treatment  ? Treatment focused on Cognition;Patient/family/caregiver education   ? Skilled Treatment Pt independently got out calendar and habit tracker to review. While reviewing calendar, pt had difficulty remembering why she wrote "do every Wed" in her calendar. Facilitated recall of previous therapy activities, and with min questions cues pt was able to recall choosing this day as her planning day. Pt required min cues to add context and write this in her calendar. Pt reports meeting her personal goals (cooking, walking, gym) but not checking them off. With min question cues, pt referenced her calendar to mark off the days on her habit tracker.  With mod I, pt reviewed home task from previous session. Pt discussed emotional changes since her stroke and difficulty with expressing this to family members. Assisted pt in generating a list of challenges she has been experiencing since her stroke to use as an assistive  communication aid. Pt educated on conversation pre-planning to aid in recall and thought organization with family members/medical providers. With min-mod cues, pt identified key points and questions to discuss in upcoming medical appointment. Pt to review this at home and add additional questions as the appointment gets closer. Pt reports difficulty falling asleep lately. Pt educated on mindfulness strategies and with mod cues, pt was able to create a list of mindfulness strategies to implement at home. At end of session, pt stated she keeps forgetting to buy more sticky notes. With min cues, pt added this to her ongoing grocery list.   ?  ? Assessment / Recommendations / Plan   ? Plan Continue with current plan of care   ? ?  ?  ? ?  ? ? ? SLP Education - 04/07/21 1349   ? ? Education Details Conversation pre-planning   ? Person(s) Educated Patient   ? Methods Explanation   ? Comprehension Verbalized understanding   ? ?  ?  ? ?  ? ? ? SLP Short Term Goals - 03/26/21 1247   ? ?  ? SLP SHORT TERM GOAL #1  ? Title Pt will verbalize and demonstrate how to implement 2 memory and attention strategies to aid daily functioning given occasional min A over 2 sessions   ? Status Achieved   ?  ? SLP SHORT TERM GOAL #2  ? Title Pt will manage finances with use of compensations with no reported episodes of missed bills given rare min A   ? Status Achieved   ?  ? SLP SHORT TERM GOAL #3  ? Title Pt will identify errors in writing and self-correct with 80% accuracy given rare min A over 2 sessions   ? Status Achieved   ?  ? SLP SHORT TERM GOAL #4  ? Title Pt will participate in further language assessment as needed (goals to be added)   ? Status Achieved   ? ?  ?  ? ?  ? ? ? SLP Long Term Goals - 03/26/21 1247   ? ?  ? SLP LONG TERM GOAL #1  ? Title Pt will verbalize and implement 4 attention and memory strategies to aid daily functioning for ADLs and IADLs with rare min A over 2 sessions   ? Time 12   ? Period Weeks   ? Status On-going   ? Target Date 06/24/21   ?  ? SLP LONG TERM GOAL #2  ? Title Pt will identify and correct paraphasias in conversation and writing with 90% accuracy given rare min A over 2 sessions   ? Time 12   ? Period Weeks   ? Status Achieved   ? Target Date 06/24/21   ?  ? SLP LONG TERM GOAL #3  ? Title Pt will use compensations for thought organization effectively in 20 minutes mod complex conversation.   ? Time 12   ? Period Weeks   ? Status On-going   ? Target Date 06/24/21   ?  ? SLP LONG TERM GOAL #4  ? Title Patient will maintain alternating attention between 2 mod complex tasks for 15 minutes with modified independence (strategies allowed).   ? Time 12   ? Period Weeks   ?  Status New   ?  ? SLP LONG TERM GOAL #5  ? Title Patient will use strategies and executive function aid to preplan and manage IADLs independently.   ? Time 12   ? Period Weeks   ?  Status New   ? ?  ?  ? ?  ? ? ? Plan - 04/07/21 1351   ? ? Clinical Impression Statement Patient presents with mild cognitive communication impairment. Pt utilizing memory/attention strategies to aid in ADLs and IADLs, but benefits from min cues for consistency. Continues to require cues for anticipatory awareness to identify additional opportunities to use trained strategies to improve function. Encourage pt to use conversation pre-planning to aid in thought organization when discussing difficult topics with family members and medical personnel. Pt with somewhat flatter affect today than in previous sessions. Education has been provided on emotional changes after stroke and pt was encouraged to discuss with her MD. Recommend skilled ST to maximize pt's cognitive communication and language skills to improve accuracy/independence with ADLs and IADLs, reduce communication breakdowns, and improve quality of life.   ? Speech Therapy Frequency 2x / week   ? Duration 12 weeks   ? Treatment/Interventions Language facilitation;Environmental controls;SLP instruction and feedback;Cognitive reorganization;Compensatory strategies;Patient/family education;Internal/external aids   ? Potential to Achieve Goals Good   ? SLP Home Exercise Plan see pt instructions   ? Consulted and Agree with Plan of Care Patient   ? ?  ?  ? ?  ? ? ?Patient will benefit from skilled therapeutic intervention in order to improve the following deficits and impairments:   ?Cognitive communication deficit ? ? ? ?Problem List ?Patient Active Problem List  ? Diagnosis Date Noted  ? Acute stroke due to ischemia ([place]) 08/10/2020  ? 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 Special screening for malignant neoplasms, colon   ? Benign neoplasm of ascending colon   ? Benign  neoplasm of descending colon   ?Caryl Comes Smith-Sneed SLP Student   ? ?[place] Smith-Sneed, [place] ?04/07/2021, 4:32 PM ? ?[place] [facility] ?[address]

## 2021-04-08 ENCOUNTER — Ambulatory Visit: Payer: No Typology Code available for payment source | Admitting: Occupational Therapy

## 2021-04-08 ENCOUNTER — Ambulatory Visit: Payer: No Typology Code available for payment source | Admitting: Speech Pathology

## 2021-04-08 ENCOUNTER — Ambulatory Visit: Payer: No Typology Code available for payment source | Admitting: Physical Therapy

## 2021-04-13 ENCOUNTER — Other Ambulatory Visit: Payer: Self-pay

## 2021-04-13 ENCOUNTER — Ambulatory Visit: Payer: No Typology Code available for payment source | Admitting: Physical Therapy

## 2021-04-13 ENCOUNTER — Encounter: Payer: Self-pay | Admitting: Occupational Therapy

## 2021-04-13 ENCOUNTER — Ambulatory Visit: Payer: No Typology Code available for payment source | Admitting: Occupational Therapy

## 2021-04-13 ENCOUNTER — Encounter: Payer: Self-pay | Admitting: Physical Therapy

## 2021-04-13 DIAGNOSIS — M6281 Muscle weakness (generalized): Secondary | ICD-10-CM

## 2021-04-13 DIAGNOSIS — R278 Other lack of coordination: Secondary | ICD-10-CM

## 2021-04-13 DIAGNOSIS — M25611 Stiffness of right shoulder, not elsewhere classified: Secondary | ICD-10-CM

## 2021-04-13 DIAGNOSIS — G8929 Other chronic pain: Secondary | ICD-10-CM

## 2021-04-13 NOTE — Therapy (Signed)
[place] [facility] ?[place], [name], [address] ?Phone: [phone]   Fax:  [phone] ? ?Physical Therapy Treatment ? ?Patient Details  ?Name: Kim Gomez ?MRN: [id number] ?Date of Birth: 22-Oct-1955 ?Referring Provider (PT): Jennings Books MD ? ? ?Encounter Date: 04/13/2021 ? ? PT End of Session - 04/13/21 1740   ? ? Visit Number 14   ? Number of Visits 24   ? Date for PT Re-Evaluation 05/18/21   ? Progress Note Due on Visit 20   ? PT Start Time [phone]   ? PT Stop Time 0845   ? PT Time Calculation (min) 38 min   ? ?  ?  ? ?  ? ? ?Past Medical History:  ?Diagnosis Date  ? Anxiety   ? Arthritis   ? legs, hands  ? Cellulitis of right knee   ? started on antibiotics 06/21/15.    ? Depression   ? Family history of adverse reaction to anesthesia   ? sister and mom -PONV  ? Hypercholesteremia   ? Hypertension   ? ? ?Past Surgical History:  ?Procedure Laterality Date  ? ABDOMINAL HYSTERECTOMY    ? COLONOSCOPY WITH PROPOFOL N/A 06/27/2015  ? Procedure: COLONOSCOPY WITH PROPOFOL;  Surgeon: Lucilla Lame, MD;  Location: [facility];  Service: Endoscopy;  Laterality: N/A;  ? POLYPECTOMY  06/27/2015  ? Procedure: POLYPECTOMY;  Surgeon: Lucilla Lame, MD;  Location: [facility];  Service: Endoscopy;;  ? TOTAL HIP ARTHROPLASTY Right 09/10/2019  ? Procedure: TOTAL HIP ARTHROPLASTY ANTERIOR APPROACH;  Surgeon: Lovell Sheehan, MD;  Location: [facility];  Service: Orthopedics;  Laterality: Right;  ? ? ?There were no vitals filed for this visit. ? ? Subjective Assessment - 04/13/21 0809   ? ? Subjective Pt reports R shoulder pain is currently a 3/10.Pt reports no shoulder pain at this time. Pt reports she had injection in the shoulder on march 16 and has not had any pain since.   ? Patient is accompained by: Family member   ? 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 Pt reports a fall on october 29th. Pt rpeorts she hurt the R UE with the fall and she reports some stiffness in the knee as well as general weakness on the right side. Pt reports she has had 2 strokes but the first went under the radar and was only evidenced by imaging presented to her by her MD. Pt reports she has been modifying her activities in order to be careful not to fall since the stroke. Reports she has been taking her time with a lot of things. Pt reports she has a membership at [employer] and she has been walking on the treadmill 2-3 times per week, she reports she walks on the treadmill for an hour at a time following working her way up. Pt reports ocassional numbness and tingling in her hands.   ? Limitations House hold activities   ? How long can you walk comfortably? 1 hour with UE support   ? Patient Stated Goals Improve her shoulder pain and function on the right side   ? Currently in Pain? No/denies   ? Pain Score 0-No pain   ? Pain Location Shoulder   ? Pain Orientation Right   ? Pain Onset More than a month ago   ? ?  ?  ? ?  ? ? ? ?  INTERVENTIONS ?  ?Manual: ?Heat donned to R shoulder, pt supine on plinth as PT provides the following interventions- ?PROM R shoulder: flexion, abduction, ER/IR x multiple reps of each with UE held at end range. PT holds R shoulder at end range for RUE flex/abd 1x30 sec for additional stretch.  ?PT provides STM and TrP release to R bicep, R posterior shoulder musculature, R upper trap  ?Heat removed. No adverse reaction to treatment observed or reported. Skin appears WNL. ?[place] joint distraction 3 x 20 sec  ?  ?Therex: ? ?PVC exercises: ?R shoulder abduction 10x ?Chest press 10x with plus to activate seratus anterior for improvement in scapular upward rotation  ?R shoulder ER 10x  ?R shoulder flexion 10x through available ROM. ?PT intermittently provides AAROM for pain modulation/comfort ?  ?Isometrics: RUE, each motion performed 10x with 3 sec holds  with towel under arm for improved RTC activation  ?IR ? ?Pt session was cut a little short today due to pt arriving a few minutes late to scheduled appointment time ?  ?Pt educated throughout session about proper posture and technique with exercises. Improved exercise technique, movement at target joints, use of target muscles after min to mod verbal, visual, tactile cues. ? ? ? ? ? ? ? ? ? ? ? ? ? ? ? ? ? ? ? ? ? ? ? ? ? ? PT Education - 04/13/21 0827   ? ? Education Details Importance of therapy following injections, also educated regarding limiting activities that put stress on shoulder following injections   ? Person(s) Educated Patient   ? Methods Explanation   ? Comprehension Verbalized understanding   ? ?  ?  ? ?  ? ? ? PT Short Term Goals - 03/23/21 1656   ? ?  ? PT SHORT TERM GOAL #1  ? Title Patient will be independent in home exercise program to improve strength/mobility for better functional independence with ADLs.   ? Baseline no HEP at this time   ? Time 4   ? Period Weeks   ? Status Achieved   ? Target Date 02/17/21   ?  ? PT SHORT TERM GOAL #2  ? Title Patient will be independent in progressive shoulder home exercise program in order to improve her shoulder strength and function   ? Baseline Patient has initial home exercise program and is comfortable with that but it is yet to be progressed for higher level activities   ? Time 4   ? Period Weeks   ? Status New   ? Target Date 04/20/21   ? ?  ?  ? ?  ? ? ? ? PT Long Term Goals - 03/23/21 1657   ? ?  ? PT LONG TERM GOAL #1  ? Title Patient will increase FOTO score to equal to or greater than  50   to demonstrate statistically significant improvement in mobility and quality of life.   ? Baseline 41 on 01/20/21   ? Time 8   ? Period Weeks   ? Status Achieved   ? Target Date 03/17/21   ?  ? PT LONG TERM GOAL #2  ? Title Pt will improve mini BEST test by 4 points or more in order to indicate clinically significant improvent in balance.   ? Baseline see  flowsheets 27 on 2/27   ? Time 12   ? Period Weeks   ? Status Achieved   ? Target Date 04/14/21   ?  ?  PT LONG TERM GOAL #3  ? Title Patient  will complete five times sit to stand test in < 15 seconds indicating an increased LE strength and improved balance.   ? Baseline 13.9 sec on 2/27   ? Time 12   ? Period Weeks   ? Status Achieved   ? Target Date 04/14/21   ?  ? PT LONG TERM GOAL #4  ? Title Patient will increase 10 meter walk test to >1.54ms as to improve gait speed for better community ambulation and to reduce fall risk.   ? Baseline .86 m/s on 01/20/21 2/27: 1.27 m/s   ? Time 8   ? Period Weeks   ? Status Achieved   ? Target Date 03/17/21   ?  ? PT LONG TERM GOAL #5  ? Title Pty will improve dual task TUG to less than 20 seconds in order to indicate improved cognitive and motor tasks.   ? Baseline 28.82 on 01/20/21 14.98 sec on 2/27   ? Time 12   ? Period Weeks   ? Status Achieved   ? Target Date 04/14/21   ?  ? Additional Long Term Goals  ? Additional Long Term Goals Yes   ?  ? PT LONG TERM GOAL #6  ? Title Patient will improve right shoulder by 1 increment on manual muscle test grading scale in order to indicate improved right upper extremity strength.   ? Baseline 3-/5 her right shoulder flexion and abduction(due to range of motion restriction) 3+ with external rotation at 0 degrees abduction   ? Time 8   ? Period Weeks   ? Status New   ? Target Date 05/18/21   ?  ? PT LONG TERM GOAL #7  ? Title Patient will improve QuickDASH score by 12.5 points in order to indicate significantly improved subjective rating of right shoulder function   ? Baseline 50% on 03/23/2021   ? Time 8   ? Period Weeks   ? Status New   ? Target Date 05/18/21   ?  ? PT LONG TERM GOAL #8  ? Title Patient will improve right shoulder active range of motion by 30 degrees with elevation and by 15 degrees with external rotation in order to indicate improved shoulder function for overhead activities and for self-care activities   ? Baseline 90  degrees flexion range of motion , 85 degrees abduction range of motion, 25 degrees external rotation range of motion ( all AROM)   ? Time 8   ? Period Weeks   ? Status New   ? Target Date 05/18/21   ?  ? PT LONG TERM G

## 2021-04-13 NOTE — Therapy (Signed)
[place] [facility] ?[place], [name], [address] ?Phone: [phone]   Fax:  [phone] ? ?Occupational Therapy Treatment ? ?Patient Details  ?Name: Kim Gomez ?MRN: [id number] ?Date of Birth: 26-Jan-1955 ?Referring Provider (OT): Manuella Ghazi, H ? ? ?Encounter Date: 04/13/2021 ? ? OT End of Session - 04/13/21 3557   ? ? Visit Number 14   ? Number of Visits 24   ? Date for OT Re-Evaluation 04/25/21   ? OT Start Time 0845   ? OT Stop Time 0930   ? OT Time Calculation (min) 45 min   ? Activity Tolerance Patient limited by pain   ? Behavior During Therapy [facility] for tasks assessed/performed   ? ?  ?  ? ?  ? ? ?Past Medical History:  ?Diagnosis Date  ? Anxiety   ? Arthritis   ? legs, hands  ? Cellulitis of right knee   ? started on antibiotics 06/21/15.    ? Depression   ? Family history of adverse reaction to anesthesia   ? sister and mom -PONV  ? Hypercholesteremia   ? Hypertension   ? ? ?Past Surgical History:  ?Procedure Laterality Date  ? ABDOMINAL HYSTERECTOMY    ? COLONOSCOPY WITH PROPOFOL N/A 06/27/2015  ? Procedure: COLONOSCOPY WITH PROPOFOL;  Surgeon: Lucilla Lame, MD;  Location: [place];  Service: Endoscopy;  Laterality: N/A;  ? POLYPECTOMY  06/27/2015  ? Procedure: POLYPECTOMY;  Surgeon: Lucilla Lame, MD;  Location: [place];  Service: Endoscopy;;  ? TOTAL HIP ARTHROPLASTY Right 09/10/2019  ? Procedure: TOTAL HIP ARTHROPLASTY ANTERIOR APPROACH;  Surgeon: Lovell Sheehan, MD;  Location: [facility];  Service: Orthopedics;  Laterality: Right;  ? ? ?There were no vitals filed for this visit. ? ? Subjective Assessment - 04/13/21 0851   ? ? Subjective  Pt reports having had numbness in her right hand during PT.   ? Pertinent History Kim Gomez is a 66 y.o. female with PMH significant for HTN, HLD, anxiety disorder, recently hospitalized for COVID-19 infection (08/06/2020) at an outside facility. Patient was brought to the ED on 7/24 after  she had a sudden onset of numbness involving right side of her body without any motor deficits, dysarthria, dysphagia. 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 Per chart for her f/u neuro visit:Left ischemic thalamic/internal capsule infarct presenting with right-sided numbness, weakness - now with residual cognitive fog (improving), imbalance (improving), fatigue, right first + second digit numbness, in patient with known vascular risk factors of hypertension, dyslipidemia, lack of physical activity, snoring   ? Patient Stated Goals Pt reports she wants to get back to doing things more independently like she was before COVID and CVA.   ? Currently in Pain? No/denies   ? ?  ?  ? ?  ? ?OT TREATMENT   ? ?Therapeutic Ex: ? ?Pt. worked on [employer], and reciprocal motion using the UBE while seated for 8 min. with minimal resistance. Constant monitoring was provided for the RUE. Pt. worked on pinch strengthening in the left hand for lateral, 3pt., and 2pt. pinch using yellow, red, green, blue, and black resistive clips. Pt. worked on placing the clips on a  horizontal dowel. Pt. worked on moving the clips through her hand from one position to the next. Tactile and verbal cues were required for eliciting the desired movement.  ? ?Neuromuscular re-ed: ? ?Pt. worked on using her right  hand for grasping one inch resistive cubes alternating thumb opposition to the tip of the 2nd through 5th digits. The board was positioned at a vertical angle to encourage wrist extension. Pt. worked on pressing them back into place while isolating 2nd through 5th digits.  ? ?Pt. is making progress overall. Pt. reports having had an episode of right hand numbness during PT this morning, however had resolved prior to OT this morning. Pt. Tolerated  moist heat to the right shoulder prior to there. Ex. Pt. required verbal cues, and cues for visual demonstration of proper  movement patterns when moving the objects through her hand from one position to the next with the clips. Pt. required cues to follow thumb opposition to each digit. Pt. Continues to work on improving RUE strength, and [name] skills in order to improve overall RUE functioning, and maximize independence with ADLs, and IADL tasks.  ? ? ? ?  ? ? ? ? ? ? ? ? ? ? ? ? ? ? ? ? ? ? ? ? ? OT Education - 04/13/21 0852   ? ? Education Details fine motor coordination and manipulation skills   ? Person(s) Educated Patient   ? Methods Explanation   ? Comprehension Verbalized understanding;Returned demonstration;Verbal cues required;Need further instruction   ? ?  ?  ? ?  ? ? ? ? ? ? OT Long Term Goals - 03/23/21 1723   ? ?  ? OT LONG TERM GOAL #1  ? Title Patient will be independent with home exercise program   ? Baseline 03/23/2021: Pt. is independent.   ? Time 12   ? Period Weeks   ? Status On-going   ? Target Date 04/25/21   ?  ? OT LONG TERM GOAL #2  ? Title Patient will complete basic self-care tasks with modified independence and without increased time to complete tasks   ? Baseline Patient has difficulty with washing her back.  Pt. requires increased time to complete self-care tasks.   ? Time 6   ? Period Weeks   ? Status Partially Met   ? Target Date 04/25/21   ?  ? OT LONG TERM GOAL #3  ? Title Patient will demonstrate good safety awareness in the kitchen with cooking tasks with supervision   ? Baseline Pt. requires Supervision for safety awareness.   ? Time 12   ? Period Weeks   ? Status On-going   ? Target Date 04/25/21   ?  ? OT LONG TERM GOAL #4  ? Title Patient will increase right grip strength by 10 pounds to be able to open jars and containers with modified independence   ? Baseline 28 pounds at eval and difficulty with managing jars and containers. 10th visit: R: 34# Jars,a nd containers continue to be difficulty to open.   ? Time 12   ? Period Weeks   ? Status Partially Met   ? Target Date 04/25/21   ?  ? OT LONG  TERM GOAL #5  ? Title Patient will improve right pinch strength by 5 pounds to pick up and manipulate items with modified independence   ? Baseline Right lateral 5 pounds, 3 point 4#, 10th visit: right lateral pinch 13#   ? Time 12   ? Period Weeks   ? Status Partially Met   ? Target Date 04/25/21   ?  ? OT LONG TERM GOAL #6  ? Title Patient will decrease right shoulder pain to 2 out of 10  or less with movement patterns for daily activities.   ? Baseline Increased pain in right shoulder, 5 out of 10 with movement. 10th visit: 8/10 right shoulder pain   ? Time 12   ? Period Weeks   ? Status New   ? Target Date 04/25/21   ?  ? OT LONG TERM GOAL #7  ? Title Patient will demonstrate score of 59 or greater on FOTO to show a clinically relevant change to impact self-care activities with greater independence   ? Baseline Score of 55 at eval, 10th visit: FOTO score: 51, increased shoulder  pain limiting overhead activity.   ? Time 12   ? Period Weeks   ? Status On-going   ? Target Date 04/25/21   ?  ? OT LONG TERM GOAL #8  ? Title Will assess for potential return to work tasks as patient progresses with therapy   ? Baseline Patient currently unable to perform her job as outlined in her job description   ? Time 12   ? Period Weeks   ? Status On-going   ? Target Date 04/25/21   ? ?  ?  ? ?  ? ? ? ? ? ? ? ? Plan - 04/13/21 1049   ? ? Clinical Impression Statement Pt. is making progress overall. Pt. reports having had an episode of right hand numbness during PT this morning, however had resolved prior to OT this morning. Pt. tolerated  moist heat to the right shoulder prior to there. Ex. Pt. required verbal cues, and cues for visual demonstration of proper movement patterns when moving the objects through her hand from one position to the next with the clips. Pt. required cues to follow thumb opposition to each digit. Pt. Continues to work on improving RUE strength, and [facility] skills in order to improve overall RUE functioning,  and maximize independence with ADLs, and IADL tasks.  ? OT Occupational Profile and History Problem Focused Assessment - Including review of records relating to presenting problem   ? Occupational performance d

## 2021-04-15 ENCOUNTER — Ambulatory Visit: Payer: No Typology Code available for payment source | Admitting: Occupational Therapy

## 2021-04-15 ENCOUNTER — Ambulatory Visit: Payer: No Typology Code available for payment source | Admitting: Physical Therapy

## 2021-04-20 ENCOUNTER — Ambulatory Visit: Payer: No Typology Code available for payment source | Admitting: Occupational Therapy

## 2021-04-20 ENCOUNTER — Ambulatory Visit: Payer: No Typology Code available for payment source | Attending: Neurology | Admitting: Physical Therapy

## 2021-04-20 ENCOUNTER — Ambulatory Visit: Payer: No Typology Code available for payment source | Admitting: Speech Pathology

## 2021-04-20 ENCOUNTER — Encounter: Payer: Self-pay | Admitting: Occupational Therapy

## 2021-04-20 DIAGNOSIS — G8929 Other chronic pain: Secondary | ICD-10-CM | POA: Diagnosis present

## 2021-04-20 DIAGNOSIS — M6281 Muscle weakness (generalized): Secondary | ICD-10-CM | POA: Insufficient documentation

## 2021-04-20 DIAGNOSIS — R278 Other lack of coordination: Secondary | ICD-10-CM

## 2021-04-20 DIAGNOSIS — R41841 Cognitive communication deficit: Secondary | ICD-10-CM | POA: Insufficient documentation

## 2021-04-20 DIAGNOSIS — M25611 Stiffness of right shoulder, not elsewhere classified: Secondary | ICD-10-CM

## 2021-04-20 DIAGNOSIS — M25511 Pain in right shoulder: Secondary | ICD-10-CM | POA: Diagnosis present

## 2021-04-20 NOTE — Therapy (Signed)
[place] ?[facility] ?[place], [name], [address] ?Phone: [phone]   Fax:  [phone] ? ?Speech Language Pathology Treatment ? ?Patient Details  ?Name: Kim Gomez ?MRN: [id number] ?Date of Birth: Dec 10, 1955 ?Referring Provider (SLP): Dr. Jennings Books ? ? ?Encounter Date: 04/20/2021 ? ? End of Session - 04/20/21 1152   ? ? Visit Number 19   ? Number of Visits 25   ? Date for SLP Re-Evaluation 06/24/21   ? Authorization - Visit Number 9   ? Progress Note Due on Visit 10   ? SLP Start Time 0900   ? SLP Stop Time  1000   ? SLP Time Calculation (min) 60 min   ? Activity Tolerance Patient tolerated treatment well   ? ?  ?  ? ?  ? ? ?Past Medical History:  ?Diagnosis Date  ? Anxiety   ? Arthritis   ? legs, hands  ? Cellulitis of right knee   ? started on antibiotics 06/21/15.    ? Depression   ? Family history of adverse reaction to anesthesia   ? sister and mom -PONV  ? Hypercholesteremia   ? Hypertension   ? ? ?Past Surgical History:  ?Procedure Laterality Date  ? ABDOMINAL HYSTERECTOMY    ? COLONOSCOPY WITH PROPOFOL N/A 06/27/2015  ? Procedure: COLONOSCOPY WITH PROPOFOL;  Surgeon: Lucilla Lame, MD;  Location: [place];  Service: Endoscopy;  Laterality: N/A;  ? POLYPECTOMY  06/27/2015  ? Procedure: POLYPECTOMY;  Surgeon: Lucilla Lame, MD;  Location: [place];  Service: Endoscopy;;  ? TOTAL HIP ARTHROPLASTY Right 09/10/2019  ? Procedure: TOTAL HIP ARTHROPLASTY ANTERIOR APPROACH;  Surgeon: Lovell Sheehan, MD;  Location: [facility];  Service: Orthopedics;  Laterality: Right;  ? ? ?There were no vitals filed for this visit. ? ? Subjective Assessment - 04/20/21 1109   ? ? Subjective "I worry about if I can do it, I'm always doubting myself."   ? Patient is accompained by: Family member   ? Currently in Pain? No/denies   ? Pain Score 0-No pain   ? ?  ?  ? ?  ? ? ? ? ? ? ? ? ADULT SLP TREATMENT - 04/20/21 1114   ? ?  ? General Information  ?  Behavior/Cognition Alert;Cooperative;Pleasant mood   ? HPI Patient is a 66 y.o. female who admitted 08/10/20 and found to have Covid-19 as well as left ischemic thalamic/internal capsule infarct. Acute SLP screened and s/o; pt now with residual cognitive fog, imbalance, fatigue, right first + second digit numbness.   ?  ? Treatment Provided  ? Treatment provided Cognitive-Linquistic   ?  ? Cognitive-Linquistic Treatment  ? Treatment focused on Cognition;Patient/family/caregiver education   ? Skilled Treatment Pt independently got out habit tracker and calendar to review. Pt reports she has been keeping up with her habit tracker. Pt reviewed calendar and entered upcoming appointments with mod I. During task, pt got distracted x1 and independently redirected her focus back to task. Pt reports going to the stroke support group. She reports feeling like her stroke is less severe than others, but she is still having challenges. Educated pt on how a stroke impacts people differently and affirmed that her difficulties since her stroke have been challenging. Discussed her progress and using strategies to work through tasks that are more difficult for her. Pt reports symptoms of depression, anxiousness, and always being tired. Pt reports taking medication daily for symptoms of  depression. Recommended pt to discuss these symptoms with her MD. Targeted thought organization with guided reading activity (Feeling tired after stroke.) Pt read article and highlighted personally relevant information from the article with min cues. Facilitated discussion of lifestyle, physical, and emotional changes since her stroke and using strategies to help with these changes (managing her energy, using her planner to track tasks, breaking tasks into steps).   ?  ? Assessment / Recommendations / Plan  ? Plan Continue with current plan of care   ?  ? Progression Toward Goals  ? Progression toward goals Progressing toward goals   ? ?  ?  ? ?  ? ? ?  SLP Education - 04/20/21 1151   ? ? Education Details Talking to MD for emotional changes post stroke, energy conservation   ? Person(s) Educated Patient   ? Methods Explanation   ? Comprehension Verbalized understanding   ? ?  ?  ? ?  ? ? ? SLP Short Term Goals - 03/26/21 1247   ? ?  ? SLP SHORT TERM GOAL #1  ? Title Pt will verbalize and demonstrate how to implement 2 memory and attention strategies to aid daily functioning given occasional min A over 2 sessions   ? Status Achieved   ?  ? SLP SHORT TERM GOAL #2  ? Title Pt will manage finances with use of compensations with no reported episodes of missed bills given rare min A   ? Status Achieved   ?  ? SLP SHORT TERM GOAL #3  ? Title Pt will identify errors in writing and self-correct with 80% accuracy given rare min A over 2 sessions   ? Status Achieved   ?  ? SLP SHORT TERM GOAL #4  ? Title Pt will participate in further language assessment as needed (goals to be added)   ? Status Achieved   ? ?  ?  ? ?  ? ? ? SLP Long Term Goals - 03/26/21 1247   ? ?  ? SLP LONG TERM GOAL #1  ? Title Pt will verbalize and implement 4 attention and memory strategies to aid daily functioning for ADLs and IADLs with rare min A over 2 sessions   ? Time 12   ? Period Weeks   ? Status On-going   ? Target Date 06/24/21   ?  ? SLP LONG TERM GOAL #2  ? Title Pt will identify and correct paraphasias in conversation and writing with 90% accuracy given rare min A over 2 sessions   ? Time 12   ? Period Weeks   ? Status Achieved   ? Target Date 06/24/21   ?  ? SLP LONG TERM GOAL #3  ? Title Pt will use compensations for thought organization effectively in 20 minutes mod complex conversation.   ? Time 12   ? Period Weeks   ? Status On-going   ? Target Date 06/24/21   ?  ? SLP LONG TERM GOAL #4  ? Title Patient will maintain alternating attention between 2 mod complex tasks for 15 minutes with modified independence (strategies allowed).   ? Time 12   ? Period Weeks   ? Status New   ?  ? SLP  LONG TERM GOAL #5  ? Title Patient will use strategies and executive function aid to preplan and manage IADLs independently.   ? Time 12   ? Period Weeks   ? Status New   ? ?  ?  ? ?  ? ? ?  Plan - 04/20/21 1153   ? ? Clinical Impression Statement Patient presents with mild cognitive communication impairment. Pt with flatter affect today, reports canceling last week's session due to a difficult family situation. Encouraged pt to follow-up with MD to discuss emotional changes after stroke. Plan to discuss schedule of upcoming stroke support groups. Pt reports using strategies to aid in daily tasks is helping. Pt is increasing independent use of memory aids, but requires occasional min cues to realize opportunities to use them. Pt continues to benefit from cues to use strategies for thought organization. Recommend skilled ST to maximize pt's cognitive communication and language skills to improve accuracy/independence with ADLs and IADLs, reduce communication breakdowns, and improve quality of life.   ? Speech Therapy Frequency 2x / week   ? Duration 12 weeks   ? Treatment/Interventions Language facilitation;Environmental controls;SLP instruction and feedback;Cognitive reorganization;Compensatory strategies;Patient/family education;Internal/external aids   ? Potential to Achieve Goals Good   ? SLP Home Exercise Plan see pt instructions   ? Consulted and Agree with Plan of Care Patient   ? ?  ?  ? ?  ? ? ?Patient will benefit from skilled therapeutic intervention in order to improve the following deficits and impairments:   ?Cognitive communication deficit ? ? ? ?Problem List ?Patient Active Problem List  ? Diagnosis Date Noted  ? Acute stroke due to ischemia ([place]) 08/10/2020  ? 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 Special screening for malignant neoplasms, colon   ? Benign neoplasm of ascending colon   ? Benign neoplasm of descending colon   ?Caryl Comes Smith-Sneed SLP Student   ? ?Lansdowne Smith-Sneed,  [place] ?04/20/2021, 12:04 PM ? ?[place] [facility] ?[place], [name], [address] ?Phone: [phone]   Fax:  [phone] ? ? ?Name: Kim Gomez

## 2021-04-20 NOTE — Patient Instructions (Signed)
Access Code: [email or web address] ?URL: [email or web address] ?Date: 04/20/2021 ?Prepared by: Rivka Barbara ? ?Exercises ?- Shoulder External Rotation with Anchored Resistance  - 1 x daily - 7 x weekly - 2 sets - 10 reps ?- Shoulder Internal Rotation with Resistance  - 1 x daily - 7 x weekly - 2 sets - 10 reps ?- Shoulder External Rotation and Scapular Retraction with Resistance  - 1 x daily - 7 x weekly - 1 sets - 10 reps - 2 second hold ?

## 2021-04-20 NOTE — Therapy (Signed)
[facility] ?[place], [name], [address] ?Phone: [phone]   Fax:  [phone] ? ?Physical Therapy Treatment ? ?Patient Details  ?Name: Kim Gomez ?MRN: [id number] ?Date of Birth: [birth year] ?Referring Provider (PT): Jennings Books MD ? ? ?Encounter Date: 04/20/2021 ? ? PT End of Session - 04/20/21 7408   ? ? Visit Number 15   ? Number of Visits 24   ? Date for PT Re-Evaluation 05/18/21   ? Progress Note Due on Visit 20   ? PT Start Time [phone]   ? PT Stop Time 0845   ? PT Time Calculation (min) 41 min   ? Equipment Utilized During Treatment Gait belt   ? Activity Tolerance Patient tolerated treatment well   ? Behavior During Therapy [facility] for tasks assessed/performed   ? ?  ?  ? ?  ? ? ?Past Medical History:  ?Diagnosis Date  ? Anxiety   ? Arthritis   ? legs, hands  ? Cellulitis of right knee   ? started on antibiotics 06/21/15.    ? Depression   ? Family history of adverse reaction to anesthesia   ? sister and mom -PONV  ? Hypercholesteremia   ? Hypertension   ? ? ?Past Surgical History:  ?Procedure Laterality Date  ? ABDOMINAL HYSTERECTOMY    ? COLONOSCOPY WITH PROPOFOL N/A 06/27/2015  ? Procedure: COLONOSCOPY WITH PROPOFOL;  Surgeon: Lucilla Lame, MD;  Location: [place];  Service: Endoscopy;  Laterality: N/A;  ? POLYPECTOMY  06/27/2015  ? Procedure: POLYPECTOMY;  Surgeon: Lucilla Lame, MD;  Location: [place];  Service: Endoscopy;;  ? TOTAL HIP ARTHROPLASTY Right 09/10/2019  ? Procedure: TOTAL HIP ARTHROPLASTY ANTERIOR APPROACH;  Surgeon: Lovell Sheehan, MD;  Location: [facility];  Service: Orthopedics;  Laterality: Right;  ? ? ?There were no vitals filed for this visit. ? ? Subjective Assessment - 04/20/21 0804   ? ? Subjective Pt reports R shoulder pain is currently a 3/10.Pt reports no shoulder pain at this time. Pt reports she had injection in the shoulder on march 16 and has not had any pain since.   ? Patient is accompained  by: Family member   ? 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 Pt reports a fall on october 29th. Pt rpeorts she hurt the R UE with the fall and she reports some stiffness in the knee as well as general weakness on the right side. Pt reports she has had 2 strokes but the first went under the radar and was only evidenced by imaging presented to her by her MD. Pt reports she has been modifying her activities in order to be careful not to fall since the stroke. Reports she has been taking her time with a lot of things. Pt reports she has a membership at [employer] and she has been walking on the treadmill 2-3 times per week, she reports she walks on the treadmill for an hour at a time following working her way up. Pt reports ocassional numbness and tingling in her hands.   ? Limitations House hold activities   ? How long can you walk comfortably? 1 hour with UE support   ? Patient Stated Goals Improve her shoulder pain and function on the right side   ? Currently in Pain? No/denies   ? Pain Score 0-No pain   ? Pain Onset  More than a month ago   ? ?  ?  ? ?  ? ? ? ?INTERVENTIONS ?  ?Manual: ?pt supine on plinth as PT provides the following interventions- ?PROM R shoulder: flexion, abduction, ER/IR x multiple reps of each with UE held at end range. PT holds R shoulder at end range for RUE flex/abd 1x30 sec for additional stretch.  ?AP GH grade 2-3 joint mobilizations x 1-2 min  ?PT provides STM and TrP release to R bicep, R posterior shoulder musculature, R upper trap  ?Heat removed. No adverse reaction to treatment observed or reported.  ?[place] joint distraction 3 x 20 sec  ?  ?Therex: ? ?PVC exercises: ?R shoulder abduction 10x ?Chest press 10x with plus to activate seratus anterior for improvement in scapular upward rotation  ? ?R shoulder flexion 10x through available ROM. ? ?  ?Access Code: [email or web address] ?URL:  [email or web address] ?Date: 04/20/2021 ?Prepared by: Rivka Barbara ? ?Exercises ?- Shoulder External Rotation with Anchored Resistance  - 1 x daily - 7 x weekly - 2 sets - 10 reps ?- Shoulder Internal Rotation with Resistance  - 1 x daily - 7 x weekly - 2 sets - 10 reps ?- Shoulder External Rotation and Scapular Retraction with Resistance  - 1 x daily - 7 x weekly - 1 sets - 10 reps - 2 second hold ? - significant weakness with external rotation outside of neutral position and cues required to prevent triceps muscle from completing movement  ? ?  ?Pt educated throughout session about proper posture and technique with exercises. Improved exercise technique, movement at target joints, use of target muscles after min to mod verbal, visual, tactile cues. ? ? ? ? ? ? ? ? ? ? ? ? ? ? ? ? ? ? ? ? ? ? ? ? ? ? ? ? ? ? ? ? ? ? ? ? ? ? ? ? PT Short Term Goals - 03/23/21 1656   ? ?  ? PT SHORT TERM GOAL #1  ? Title Patient will be independent in home exercise program to improve strength/mobility for better functional independence with ADLs.   ? Baseline no HEP at this time   ? Time 4   ? Period Weeks   ? Status Achieved   ? Target Date 02/17/21   ?  ? PT SHORT TERM GOAL #2  ? Title Patient will be independent in progressive shoulder home exercise program in order to improve her shoulder strength and function   ? Baseline Patient has initial home exercise program and is comfortable with that but it is yet to be progressed for higher level activities   ? Time 4   ? Period Weeks   ? Status New   ? Target Date 04/20/21   ? ?  ?  ? ?  ? ? ? ? PT Long Term Goals - 03/23/21 1657   ? ?  ? PT LONG TERM GOAL #1  ? Title Patient will increase FOTO score to equal to or greater than  50   to demonstrate statistically significant improvement in mobility and quality of life.   ? Baseline 41 on 01/20/21   ? Time 8   ? Period Weeks   ? Status Achieved   ? Target Date 03/17/21   ?  ? PT LONG TERM GOAL #2  ? Title Pt will improve  mini BEST test by 4 points or more in order to indicate clinically significant improvent in balance.   ?  Baseline see flowsheets 27 on 2/27   ? Time 12   ? Period Weeks   ? Status Achieved   ? Target Date 04/14/21   ?  ? PT LONG TERM GOAL #3  ? Title Patient  will complete five times sit to stand test in < 15 seconds indicating an increased LE strength and improved balance.   ? Baseline 13.9 sec on 2/27   ? Time 12   ? Period Weeks   ? Status Achieved   ? Target Date 04/14/21   ?  ? PT LONG TERM GOAL #4  ? Title Patient will increase 10 meter walk test to >1.84ms as to improve gait speed for better community ambulation and to reduce fall risk.   ? Baseline .86 m/s on 01/20/21 2/27: 1.27 m/s   ? Time 8   ? Period Weeks   ? Status Achieved   ? Target Date 03/17/21   ?  ? PT LONG TERM GOAL #5  ? Title Pty will improve dual task TUG to less than 20 seconds in order to indicate improved cognitive and motor tasks.   ? Baseline 28.82 on 01/20/21 14.98 sec on 2/27   ? Time 12   ? Period Weeks   ? Status Achieved   ? Target Date 04/14/21   ?  ? Additional Long Term Goals  ? Additional Long Term Goals Yes   ?  ? PT LONG TERM GOAL #6  ? Title Patient will improve right shoulder by 1 increment on manual muscle test grading scale in order to indicate improved right upper extremity strength.   ? Baseline 3-/5 her right shoulder flexion and abduction(due to range of motion restriction) 3+ with external rotation at 0 degrees abduction   ? Time 8   ? Period Weeks   ? Status New   ? Target Date 05/18/21   ?  ? PT LONG TERM GOAL #7  ? Title Patient will improve QuickDASH score by 12.5 points in order to indicate significantly improved subjective rating of right shoulder function   ? Baseline 50% on 03/23/2021   ? Time 8   ? Period Weeks   ? Status New   ? Target Date 05/18/21   ?  ? PT LONG TERM GOAL #8  ? Title Patient will improve right shoulder active range of motion by 30 degrees with elevation and by 15 degrees with external rotation  in order to indicate improved shoulder function for overhead activities and for self-care activities   ? Baseline 90 degrees flexion range of motion , 85 degrees abduction range of motion, 25 degrees externa

## 2021-04-20 NOTE — Therapy (Signed)
[facility] ?[place], [name], [address] ?Phone: [phone]   Fax:  [phone] ? ?Occupational Therapy Treatment ? ?Patient Details  ?Name: Kim Gomez ?MRN: [id number] ?Date of Birth: [birth year] ?Referring Provider (OT): Manuella Ghazi, H ? ? ?Encounter Date: 04/20/2021 ? ? OT End of Session - 04/22/21 0845   ? ? Visit Number 15   ? Number of Visits 24   ? Date for OT Re-Evaluation 04/25/21   ? OT Start Time 1017   ? OT Stop Time 1100   ? OT Time Calculation (min) 43 min   ? Activity Tolerance Patient limited by pain   ? Behavior During Therapy [facility] for tasks assessed/performed   ? ?  ?  ? ?  ? ? ?Past Medical History:  ?Diagnosis Date  ? Anxiety   ? Arthritis   ? legs, hands  ? Cellulitis of right knee   ? started on antibiotics 06/21/15.    ? Depression   ? Family history of adverse reaction to anesthesia   ? sister and mom -PONV  ? Hypercholesteremia   ? Hypertension   ? ? ?Past Surgical History:  ?Procedure Laterality Date  ? ABDOMINAL HYSTERECTOMY    ? COLONOSCOPY WITH PROPOFOL N/A 06/27/2015  ? Procedure: COLONOSCOPY WITH PROPOFOL;  Surgeon: Lucilla Lame, MD;  Location: [place];  Service: Endoscopy;  Laterality: N/A;  ? POLYPECTOMY  06/27/2015  ? Procedure: POLYPECTOMY;  Surgeon: Lucilla Lame, MD;  Location: [place];  Service: Endoscopy;;  ? TOTAL HIP ARTHROPLASTY Right 09/10/2019  ? Procedure: TOTAL HIP ARTHROPLASTY ANTERIOR APPROACH;  Surgeon: Lovell Sheehan, MD;  Location: [facility];  Service: Orthopedics;  Laterality: Right;  ? ? ?There were no vitals filed for this visit. ? ? Subjective Assessment - 04/23/21 0844   ? ? Subjective  Pt reports her mom was in the ER this weekend and she didn't get as much sleep.   ? Pertinent History Kim Gomez is a 66 y.o. female with PMH significant for HTN, HLD, anxiety disorder, recently hospitalized for COVID-19 infection (08/06/2020) at an outside facility. Patient was brought to the  ED on 7/24 after she had a sudden onset of numbness involving right side of her body without any motor deficits, dysarthria, dysphagia. 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 Per chart for her f/u neuro visit:Left ischemic thalamic/internal capsule infarct presenting with right-sided numbness, weakness - now with residual cognitive fog (improving), imbalance (improving), fatigue, right first + second digit numbness, in patient with known vascular risk factors of hypertension, dyslipidemia, lack of physical activity, snoring   ? Patient Stated Goals Pt reports she wants to get back to doing things more independently like she was before COVID and CVA.   ? Currently in Pain? No/denies   ? Pain Score 0-No pain   ? ?  ?  ? ?  ? ?Neuromuscular Reeducation: ? ?Use of 100 judy board jumbo peg design placed on wedge on tabletop to encourage increased reach with right UE for improved ROM and shoulder flexion.  Once design was complete, pt then worked towards turning and flipping pegs end to end to place back into board.  Completed 2 designs this date.  ?2nd design without the wedge since shoulder started to ache some during repetitive reaching tasks.  Removing jumbo pegs one at a time but placing up to 3 in palm and then into container.   ? ?  Manual:  Massage to right shoulder and along bicep as tightness and mild pain surfaced during session. ? ? ?Response to tx: ?Pt continues to report some stiffness in the right shoulder, some mild pain with repetitive tasks or as shoulder is engaged in tasks throughout session.  Shoulder responds well to manual massage and stretching, pt is also having her husband massage the shoulder and arm at home as needed.  Pt continues to demonstrate improvements in all areas with ROM, strength, pain and functional use.  Continue to work towards goals in plan of care, will perform reassessment next session.    ? ? ? ? ? ? ? ? ? ? ? ? ? ? ? ? ? ? ? ? OT Education - 04/22/21 0845   ? ? Education Details reaching tasks combined with coordination   ? Person(s) Educated Patient   ? Methods Explanation   ? Comprehension Verbalized understanding;Returned demonstration;Verbal cues required;Need further instruction   ? ?  ?  ? ?  ? ? ? ? ? ? OT Long Term Goals - 03/23/21 1723   ? ?  ? OT LONG TERM GOAL #1  ? Title Patient will be independent with home exercise program   ? Baseline 03/23/2021: Pt. is independent.   ? Time 12   ? Period Weeks   ? Status On-going   ? Target Date 04/25/21   ?  ? OT LONG TERM GOAL #2  ? Title Patient will complete basic self-care tasks with modified independence and without increased time to complete tasks   ? Baseline Patient has difficulty with washing her back.  Pt. requires increased time to complete self-care tasks.   ? Time 6   ? Period Weeks   ? Status Partially Met   ? Target Date 04/25/21   ?  ? OT LONG TERM GOAL #3  ? Title Patient will demonstrate good safety awareness in the kitchen with cooking tasks with supervision   ? Baseline Pt. requires Supervision for safety awareness.   ? Time 12   ? Period Weeks   ? Status On-going   ? Target Date 04/25/21   ?  ? OT LONG TERM GOAL #4  ? Title Patient will increase right grip strength by 10 pounds to be able to open jars and containers with modified independence   ? Baseline 28 pounds at eval and difficulty with managing jars and containers. 10th visit: R: 34# Jars,a nd containers continue to be difficulty to open.   ? Time 12   ? Period Weeks   ? Status Partially Met   ? Target Date 04/25/21   ?  ? OT LONG TERM GOAL #5  ? Title Patient will improve right pinch strength by 5 pounds to pick up and manipulate items with modified independence   ? Baseline Right lateral 5 pounds, 3 point 4#, 10th visit: right lateral pinch 13#   ? Time 12   ? Period Weeks   ? Status Partially Met   ? Target Date 04/25/21   ?  ? OT LONG TERM GOAL #6  ? Title Patient  will decrease right shoulder pain to 2 out of 10 or less with movement patterns for daily activities.   ? Baseline Increased pain in right shoulder, 5 out of 10 with movement. 10th visit: 8/10 right shoulder pain   ? Time 12   ? Period Weeks   ? Status New   ? Target Date 04/25/21   ?  ? OT  LONG TERM GOAL #7  ? Title Patient will demonstrate score of 59 or greater on FOTO to show a clinically relevant change to impact self-care activities with greater independence   ? Baseline Score of 55 at eval, 10th visit: FOTO score: 51, increased shoulder  pain limiting overhead activity.   ? Time 12   ? Period Weeks   ? Status On-going   ? Target Date 04/25/21   ?  ? OT LONG TERM GOAL #8  ? Title Will assess for potential return to work tasks as patient progresses with therapy   ? Baseline Patient currently unable to perform her job as outlined in her job description   ? Time 12   ? Period Weeks   ? Status On-going   ? Target Date 04/25/21   ? ?  ?  ? ?  ? ? ? ? ? ? ? ? Plan - 04/22/21 0845   ? ? Clinical Impression Statement Pt continues to report some stiffness in the right shoulder, some mild pain with repetitive tasks or as shoulder is engaged in tasks throughout session.  Shoulder responds well to manual massage and stretching, pt is also having her husband massage the shoulder and arm at home as needed.  Pt continues to demonstrate improvements in all areas with ROM, strength, pain and functional use.  Continue to work towards goals in plan of care, will perform reassessment next session.   ? OT Occupational Profile and History Problem Focused Assessment - Including review of records relating to presenting problem   ? Occupational performance deficits (Please refer to evaluation for details): ADL's;IADL's;Social Participation   ? Body Structure / Function / Physical Skills ADL;Dexterity;ROM;Strength;Coordination;FMC;IADL;Pain;Sensation;UE functional use;Decreased knowledge of use of DME   ? Cognitive Skills Memory;Safety  Awareness   ? Psychosocial Skills Environmental  Adaptations;Habits;Routines and Behaviors   ? Rehab Potential Good   ? Clinical Decision Making Several treatment options, min-mod task modification necessary   ? Comorbidities Af

## 2021-04-22 ENCOUNTER — Encounter: Payer: Self-pay | Admitting: Cardiology

## 2021-04-22 ENCOUNTER — Ambulatory Visit: Payer: No Typology Code available for payment source | Admitting: Occupational Therapy

## 2021-04-22 ENCOUNTER — Ambulatory Visit (INDEPENDENT_AMBULATORY_CARE_PROVIDER_SITE_OTHER): Payer: No Typology Code available for payment source | Admitting: Cardiology

## 2021-04-22 ENCOUNTER — Ambulatory Visit: Payer: No Typology Code available for payment source | Admitting: Speech Pathology

## 2021-04-22 ENCOUNTER — Ambulatory Visit: Payer: No Typology Code available for payment source | Admitting: Physical Therapy

## 2021-04-22 VITALS — BP 140/80 | HR 74 | Ht 64.0 in | Wt 197.0 lb

## 2021-04-22 DIAGNOSIS — G8929 Other chronic pain: Secondary | ICD-10-CM

## 2021-04-22 DIAGNOSIS — M6281 Muscle weakness (generalized): Secondary | ICD-10-CM

## 2021-04-22 DIAGNOSIS — R278 Other lack of coordination: Secondary | ICD-10-CM

## 2021-04-22 DIAGNOSIS — I639 Cerebral infarction, unspecified: Secondary | ICD-10-CM | POA: Diagnosis not present

## 2021-04-22 DIAGNOSIS — M25611 Stiffness of right shoulder, not elsewhere classified: Secondary | ICD-10-CM

## 2021-04-22 DIAGNOSIS — R41841 Cognitive communication deficit: Secondary | ICD-10-CM

## 2021-04-22 NOTE — Patient Instructions (Signed)
Medication Instructions:  ?Your physician recommends that you continue on your current medications as directed. Please refer to the Current Medication list given to you today. ? ?Labwork: ?None ordered. ? ?Testing/Procedures: ?None ordered. ? ?Follow-Up: ? ?Your physician wants you to follow-up in: As needed with Dr. Quentin Ore. He will follow your device remotely.  ? ? ?Implantable Loop Recorder Placement, Care After ?This sheet gives you information about how to care for yourself after your procedure. Your health care provider may also give you more specific instructions. If you have problems or questions, contact your health care provider. ?What can I expect after the procedure? ?After the procedure, it is common to have: ?Soreness or discomfort near the incision. ?Some swelling or bruising near the incision. ? ?Follow these instructions at home: ?Incision care ? ? Leave your outer dressing on for 72 hours.  After 72 hours you can remove your outer dressing and shower. ?Leave adhesive strips in place. These skin closures may need to stay in place for 1-2 weeks. If adhesive strip edges start to loosen and curl up, you may trim the loose edges.  You may remove the strips if they have not fallen off after 2 weeks. ?Check your incision area every day for signs of infection. Check for: ?Redness, swelling, or pain. ?Fluid or blood. ?Warmth. ?Pus or a bad smell. ?Do not take baths, swim, or use a hot tub until your incision is completely healed. ?If your wound site starts to bleed apply pressure.   ?   ?If you have any questions/concerns please call the device clinic at [phone]. ? ?Activity ? ?Return to your normal activities. ? ?General instructions ?Follow instructions from your health care provider about how to manage your implantable loop recorder and transmit the information. Learn how to activate a recording if this is necessary for your type of device. ?You may go through a metal detection gate, and you may let  someone hold a metal detector over your chest. Show your ID card if needed. ?Do not have an MRI unless you check with your health care provider first. ?Take over-the-counter and prescription medicines only as told by your health care provider. ?Keep all follow-up visits as told by your health care provider. This is important. ?Contact a health care provider if: ?You have redness, swelling, or pain around your incision. ?You have a fever. ?You have pain that is not relieved by your pain medicine. ?You have triggered your device because of fainting (syncope) or because of a heartbeat that feels like it is racing, slow, fluttering, or skipping (palpitations). ?Get help right away if you have: ?Chest pain. ?Difficulty breathing. ?Summary ?After the procedure, it is common to have soreness or discomfort near the incision. ?Change your dressing as told by your health care provider. ?Follow instructions from your health care provider about how to manage your implantable loop recorder and transmit the information. ?Keep all follow-up visits as told by your health care provider. This is important. ?This information is not intended to replace advice given to you by your health care provider. Make sure you discuss any questions you have with your health care provider. ?Document Released: 12/16/2014 Document Revised: 02/19/2017 Document Reviewed: 02/19/2017 ?Elsevier Patient Education ? [employer].  ?

## 2021-04-22 NOTE — Therapy (Signed)
[facility] ?[place], [name], [address] ?Phone: [phone]   Fax:  [phone] ? ?Physical Therapy Treatment ? ?Patient Details  ?Name: Kim Gomez ?MRN: [id number] ?Date of Birth: [birth year] ?Referring Provider (PT): Jennings Books MD ? ? ?Encounter Date: 04/22/2021 ? ? ? ?Past Medical History:  ?Diagnosis Date  ? Anxiety   ? Arthritis   ? legs, hands  ? Cellulitis of right knee   ? started on antibiotics 06/21/15.    ? Depression   ? Family history of adverse reaction to anesthesia   ? sister and mom -PONV  ? Hypercholesteremia   ? Hypertension   ? ? ?Past Surgical History:  ?Procedure Laterality Date  ? ABDOMINAL HYSTERECTOMY    ? COLONOSCOPY WITH PROPOFOL N/A 06/27/2015  ? Procedure: COLONOSCOPY WITH PROPOFOL;  Surgeon: Lucilla Lame, MD;  Location: [place] [facility];  Service: Endoscopy;  Laterality: N/A;  ? POLYPECTOMY  06/27/2015  ? Procedure: POLYPECTOMY;  Surgeon: Lucilla Lame, MD;  Location: [place];  Service: Endoscopy;;  ? TOTAL HIP ARTHROPLASTY Right 09/10/2019  ? Procedure: TOTAL HIP ARTHROPLASTY ANTERIOR APPROACH;  Surgeon: Lovell Sheehan, MD;  Location: [facility];  Service: Orthopedics;  Laterality: Right;  ? ? ?There were no vitals filed for this visit. ? ? Subjective Assessment - 04/22/21 0802   ? ? Subjective Pt reports R shoulder pain is currently a 3/10.Pt reports no shoulder pain at this time. Pt reports no pain but a little bit of soreness in her shoulder following new HEP.   ? Patient is accompained by: Family member   ? 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 Pt reports a fall on october 29th. Pt rpeorts she hurt the R UE with the fall and she reports some stiffness in the knee as well as general weakness on the right side. Pt reports she has had 2 strokes but the first went under the radar and was only  evidenced by imaging presented to her by her MD. Pt reports she has been modifying her activities in order to be careful not to fall since the stroke. Reports she has been taking her time with a lot of things. Pt reports she has a membership at [employer] and she has been walking on the treadmill 2-3 times per week, she reports she walks on the treadmill for an hour at a time following working her way up. Pt reports ocassional numbness and tingling in her hands.   ? Limitations House hold activities   ? How long can you walk comfortably? 1 hour with UE support   ? Patient Stated Goals Improve her shoulder pain and function on the right side   ? Pain Onset More than a month ago   ? ?  ?  ? ?  ? ? ? ?INTERVENTIONS ?  ?Manual: ?pt supine on plinth as PT provides the following interventions- ?PROM R shoulder: flexion, abduction, ER/IR x multiple reps of each with UE held at end range.  ?AP and PA GH grade 2-3 joint mobilizations x 1-2 min  ?PT provides STM and TrP release to R bicep, R posterior shoulder, R pec minor musculature x several minutes   ?  ?Therex: ? ?Pt transitions to L SL, towel roll added under R arm ?2 x 10 shoulder ER with 1 #  ? ?Serratus punch with shoulder flexion AROM from 90  to 120 holding serratus activaation ?X 10 ?Cues to maintain shoulder muscle activation  ? ?PVC exercises: ?R shoulder abduction 10x ?R shoulder flexion 10x through available ROM. ?Chest press with plus ( serratus activation)  with 4# AW x 10  ? ?  ?Pt educated throughout session about proper posture and technique with exercises. Improved exercise technique, movement at target joints, use of target muscles after min to mod verbal, visual, tactile cues. ? ? ?Note: Portions of this document were prepared using Dragon voice recognition software and although reviewed may contain unintentional dictation errors in syntax, grammar, or spelling. ? ? ? ? ? ? ? ? ? ? ? ? ? ? ? ? ? ? ? ? ? ? ? ? ? ? ? ? ? ? PT Short Term Goals -  03/23/21 1656   ? ?  ? PT SHORT TERM GOAL #1  ? Title Patient will be independent in home exercise program to improve strength/mobility for better functional independence with ADLs.   ? Baseline no HEP at this time   ? Time 4   ? Period Weeks   ? Status Achieved   ? Target Date 02/17/21   ?  ? PT SHORT TERM GOAL #2  ? Title Patient will be independent in progressive shoulder home exercise program in order to improve her shoulder strength and function   ? Baseline Patient has initial home exercise program and is comfortable with that but it is yet to be progressed for higher level activities   ? Time 4   ? Period Weeks   ? Status New   ? Target Date 04/20/21   ? ?  ?  ? ?  ? ? ? ? PT Long Term Goals - 03/23/21 1657   ? ?  ? PT LONG TERM GOAL #1  ? Title Patient will increase FOTO score to equal to or greater than  50   to demonstrate statistically significant improvement in mobility and quality of life.   ? Baseline 41 on 01/20/21   ? Time 8   ? Period Weeks   ? Status Achieved   ? Target Date 03/17/21   ?  ? PT LONG TERM GOAL #2  ? Title Pt will improve mini BEST test by 4 points or more in order to indicate clinically significant improvent in balance.   ? Baseline see flowsheets 27 on 2/27   ? Time 12   ? Period Weeks   ? Status Achieved   ? Target Date 04/14/21   ?  ? PT LONG TERM GOAL #3  ? Title Patient  will complete five times sit to stand test in < 15 seconds indicating an increased LE strength and improved balance.   ? Baseline 13.9 sec on 2/27   ? Time 12   ? Period Weeks   ? Status Achieved   ? Target Date 04/14/21   ?  ? PT LONG TERM GOAL #4  ? Title Patient will increase 10 meter walk test to >1.18ms as to improve gait speed for better community ambulation and to reduce fall risk.   ? Baseline .86 m/s on 01/20/21 2/27: 1.27 m/s   ? Time 8   ? Period Weeks   ? Status Achieved   ? Target Date 03/17/21   ?  ? PT LONG TERM GOAL #5  ? Title Pty will improve dual task TUG to less than 20 seconds in order to  indicate improved cognitive and motor tasks.   ?  Baseline 28.82 on 01/20/21 14.98 sec on 2/27   ? Time 12   ? Period Weeks   ? Status Achieved   ? Target Date 04/14/21   ?  ? Additional Long Term Goals  ? Additional Long Term Goals Yes   ?  ? PT LONG TERM GOAL #6  ? Title Patient will improve right shoulder by 1 increment on manual muscle test grading scale in order to indicate improved right upper extremity strength.   ? Baseline 3-/5 her right shoulder flexion and abduction(due to range of motion restriction) 3+ with external rotation at 0 degrees abduction   ? Time 8   ? Period Weeks   ? Status New   ? Target Date 05/18/21   ?  ? PT LONG TERM GOAL #7  ? Title Patient will improve QuickDASH score by 12.5 points in order to indicate significantly improved subjective rating of right shoulder function   ? Baseline 50% on 03/23/2021   ? Time 8   ? Period Weeks   ? Status New   ? Target Date 05/18/21   ?  ? PT LONG TERM GOAL #8  ? Title Patient will improve right shoulder active range of motion by 30 degrees with elevation and by 15 degrees with external rotation in order to indicate improved shoulder function for overhead activities and for self-care activities   ? Baseline 90 degrees flexion range of motion , 85 degrees abduction range of motion, 25 degrees external rotation range of motion ( all AROM)   ? Time 8   ? Period Weeks   ? Status New   ? Target Date 05/18/21   ?  ? PT LONG TERM GOAL  #9  ? TITLE Patient will improve right shoulder passive range of motion by 30 degrees of elevation by 10 degrees with external rotation in order to indicate improved right shoulder range of motion for progressive functional activities   ? Baseline 120 degrees flexion, 123 degrees abduction, 50 degrees external rotation at 45 degrees abduction on 03/23/2021   ? Time 8   ? Period Weeks   ? Status New   ? Target Date 05/18/21   ? ?  ?  ? ?  ? ? ? ? ? ? ? ? Plan - 04/22/21 0806   ? ? Clinical Impression Statement Pt presents with  excellent motivation for completion of PT exercises. Pt recently had R shoulder injection and her pain has ipmroved significantly since. Pt continues to demonstrate significantly improved ROM this session compared to previous

## 2021-04-22 NOTE — Therapy (Signed)
[facility] ?[place], [name], [address] ?Phone: [phone]   Fax:  [phone] ? ?Speech Language Pathology Treatment and Progress Note ? ?Patient Details  ?Name: Kim Gomez ?MRN: [id number] ?Date of Birth: [birth year] ?Referring Provider (SLP): Dr. Jennings Books ? ?Speech Therapy Progress Note ? ?Dates of Reporting Period: 02/24/21 to 04/22/21 ? ?Objective: Patient has been seen for 10 speech therapy sessions this reporting period targeting cognitive communication deficits. Patient is making progress toward LTGs and met 4/4 STGs this reporting period. See skilled intervention, clinical impressions, and goals below for details. ? ? ?Encounter Date: 04/22/2021 ? ? End of Session - 04/22/21 1240   ? ? Visit Number 20   ? Number of Visits 25   ? Date for SLP Re-Evaluation 06/24/21   ? Authorization - Visit Number 10   ? Progress Note Due on Visit 10   ? SLP Start Time [phone]   ? SLP Stop Time  [phone]   ? SLP Time Calculation (min) 56 min   ? Activity Tolerance Patient tolerated treatment well   ? ?  ?  ? ?  ? ? ?Past Medical History:  ?Diagnosis Date  ? Anxiety   ? Arthritis   ? legs, hands  ? Cellulitis of right knee   ? started on antibiotics 06/21/15.    ? Depression   ? Family history of adverse reaction to anesthesia   ? sister and mom -PONV  ? Hypercholesteremia   ? Hypertension   ? ? ?Past Surgical History:  ?Procedure Laterality Date  ? ABDOMINAL HYSTERECTOMY    ? COLONOSCOPY WITH PROPOFOL N/A 06/27/2015  ? Procedure: COLONOSCOPY WITH PROPOFOL;  Surgeon: Lucilla Lame, MD;  Location: [facility];  Service: Endoscopy;  Laterality: N/A;  ? POLYPECTOMY  06/27/2015  ? Procedure: POLYPECTOMY;  Surgeon: Lucilla Lame, MD;  Location: [place];  Service: Endoscopy;;  ? TOTAL HIP ARTHROPLASTY Right 09/10/2019  ? Procedure: TOTAL HIP ARTHROPLASTY ANTERIOR APPROACH;  Surgeon: Lovell Sheehan, MD;  Location: [facility];  Service: Orthopedics;   Laterality: Right;  ? ? ?There were no vitals filed for this visit. ? ? Subjective Assessment - 04/22/21 1152   ? ? Subjective "I'm getting all confused"   ? Currently in Pain? No/denies   ? Pain Score 0-No pain   ? ?  ?  ? ?  ? ? ? ? ? ? ? ? ADULT SLP TREATMENT - 04/22/21 1154   ? ?  ? General Information  ? Behavior/Cognition Alert;Cooperative;Pleasant mood   ? HPI Patient is a 66 y.o. female who admitted 08/10/20 and found to have Covid-19 as well as left ischemic thalamic/internal capsule infarct. Acute SLP screened and s/o; pt now with residual cognitive fog, imbalance, fatigue, right first + second digit numbness.   ?  ? Treatment Provided  ? Treatment provided Cognitive-Linquistic   ?  ? Cognitive-Linquistic Treatment  ? Treatment focused on Cognition;Patient/family/caregiver education   ? Skilled Treatment Pt reports feeling bothered that she forgot to close her front door yesterday while in a rush to leave the house. With min assist, pt generated memory/attention strategies to help prevent this from happening again. Pt independently got out habit tracker and planner to review past and upcoming events. Graduate clinician inquired about her activities for today. Pt initially did not recall that she chose Wednesdays as her planning day. With min question cue, pt entered a reminder in her calendar for upcoming Wednesdays.  Targeted executive functioning with meal planning task. Pt with hesitation to initiating task. Provided pt an example weekly meal planning chart. With min cues pt chose a section in her notebook to create her own chart. Pt required mod cues to generate a checklist of steps to assist with meal planning; deciding which days she will cook/grocery shop, choosing meals for those days, writing a list of ingredients, and grocery shopping. During task, pt completed the steps with min-mod cues. Pt required cues to check her list after each step to know what to do next. Pt reports not feeling focused  today because she did not take her medicine this morning. Educated pt on the importance of a routine to facilitate recall and the functional impact of not taking her medicine (decreased focus).   ?  ? Assessment / Recommendations / Plan  ? Plan Continue with current plan of care   ?  ? Progression Toward Goals  ? Progression toward goals Progressing toward goals   ? ?  ?  ? ?  ? ? ? SLP Education - 04/22/21 1243   ? ? Education Details Attention/Memory strategies to aid in recall (checklist, calendar, routine)   ? Person(s) Educated Patient   ? Methods Explanation   ? Comprehension Verbalized understanding   ? ?  ?  ? ?  ? ? ? SLP Short Term Goals - 03/26/21 1247   ? ?  ? SLP SHORT TERM GOAL #1  ? Title Pt will verbalize and demonstrate how to implement 2 memory and attention strategies to aid daily functioning given occasional min A over 2 sessions   ? Status Achieved   ?  ? SLP SHORT TERM GOAL #2  ? Title Pt will manage finances with use of compensations with no reported episodes of missed bills given rare min A   ? Status Achieved   ?  ? SLP SHORT TERM GOAL #3  ? Title Pt will identify errors in writing and self-correct with 80% accuracy given rare min A over 2 sessions   ? Status Achieved   ?  ? SLP SHORT TERM GOAL #4  ? Title Pt will participate in further language assessment as needed (goals to be added)   ? Status Achieved   ? ?  ?  ? ?  ? ? ? SLP Long Term Goals - 04/22/21 1215   ? ?  ? SLP LONG TERM GOAL #1  ? Title Pt will verbalize and implement 4 attention and memory strategies to aid daily functioning for ADLs and IADLs with rare min A over 2 sessions   ? Time 12   ? Period Weeks   ? Status On-going   ? Target Date 06/24/21   ?  ? SLP LONG TERM GOAL #2  ? Title Pt will identify and correct paraphasias in conversation and writing with 90% accuracy given rare min A over 2 sessions   ? Time 12   ? Period Weeks   ? Status Achieved   ? Target Date 06/24/21   ?  ? SLP LONG TERM GOAL #3  ? Title Pt will use  compensations for thought organization effectively in 20 minutes mod complex conversation.   ? Time 12   ? Period Weeks   ? Status On-going   ? Target Date 06/24/21   ?  ? SLP LONG TERM GOAL #4  ? Title Patient will maintain alternating attention between 2 mod complex tasks for 15 minutes with modified independence (strategies allowed).   ?  Time 12   ? Period Weeks   ? Status On-going   ? Target Date 06/24/21   ?  ? SLP LONG TERM GOAL #5  ? Title Patient will use strategies and executive function aid to preplan and manage IADLs independently.   ? Time 12   ? Period Weeks   ? Status On-going   ? Target Date 06/24/21   ? ?  ?  ? ?  ? ? ? Plan - 04/22/21 1220   ? ? Clinical Impression Statement Patient presents with mild cognitive communication impairment. Pt with increased affect today, more than previous session . Pt demonstrated decreased attention during today's tasks. Pt had difficulty alternating between tasks and maintaing focus. Pt continues to benefit from cues to initiate and recognize opportunities to use strategies to aid in managing IADLs and ADLs. Reviewed patient's LTGs, they are still appropriate at this time. Recommend skilled ST to maximize pt's cognitive communication and language skills to improve accuracy/independence with ADLs and IADLs, reduce communication breakdowns, and improve quality of life.   ? Speech Therapy Frequency 2x / week   ? Duration 12 weeks   ? Treatment/Interventions Language facilitation;Environmental controls;SLP instruction and feedback;Cognitive reorganization;Compensatory strategies;Patient/family education;Internal/external aids   ? Potential to Achieve Goals Good   ? SLP Home Exercise Plan see pt instructions   ? Consulted and Agree with Plan of Care Patient   ? ?  ?  ? ?  ? ? ?Patient will benefit from skilled therapeutic intervention in order to improve the following deficits and impairments:   ?Cognitive communication deficit ? ? ? ?Problem List ?Patient Active Problem  List  ? Diagnosis Date Noted  ? Acute stroke due to ischemia ([place]) 08/10/2020  ? 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 Special screening for malignant neoplasms, colon   ? Benign neoplasm of ascend

## 2021-04-22 NOTE — Progress Notes (Signed)
?Electrophysiology Office Note:   ? ?Date:  04/22/2021  ? ?ID:  Kim Gomez, DOB 1955-02-11, MRN [id number] ? ?PCP:  Gladstone Lighter, MD  ?[facility] Cardiologist:  None  ?[facility] Electrophysiologist:  Vickie Epley, MD  ? ?Referring MD: Gladstone Lighter, MD  ? ?Chief Complaint: Cryptogenic stroke ? ?History of Present Illness:   ? ?Kim Gomez is a 66 y.o. female who presents for an evaluation of cryptogenic stroke at the request of Dr. Clayborn Bigness. Their medical history includes stroke in July 2022, hypertension, anxiety.  The patient last saw Dr Clayborn Bigness February 12, 2021.  Her prior stroke in 2022 was highly suspicious for an embolic source.  No evidence of atrial fibrillation thus far including on a 4-week event monitor (reported on February 12, 2021 office note).  He recommended a loop recorder. ? ? ?  ?Past Medical History:  ?Diagnosis Date  ? Anxiety   ? Arthritis   ? legs, hands  ? Cellulitis of right knee   ? started on antibiotics 06/21/15.    ? Depression   ? Family history of adverse reaction to anesthesia   ? sister and mom -PONV  ? Hypercholesteremia   ? Hypertension   ? ? ?Past Surgical History:  ?Procedure Laterality Date  ? ABDOMINAL HYSTERECTOMY    ? COLONOSCOPY WITH PROPOFOL N/A 06/27/2015  ? Procedure: COLONOSCOPY WITH PROPOFOL;  Surgeon: Lucilla Lame, MD;  Location: [facility];  Service: Endoscopy;  Laterality: N/A;  ? POLYPECTOMY  06/27/2015  ? Procedure: POLYPECTOMY;  Surgeon: Lucilla Lame, MD;  Location: [facility] [facility];  Service: Endoscopy;;  ? TOTAL HIP ARTHROPLASTY Right 09/10/2019  ? Procedure: TOTAL HIP ARTHROPLASTY ANTERIOR APPROACH;  Surgeon: Lovell Sheehan, MD;  Location: [facility];  Service: Orthopedics;  Laterality: Right;  ? ? ?Current Medications: ?Current Meds  ?Medication Sig  ? amLODipine (NORVASC) 2.5 MG tablet Take 1 tablet (2.5 mg total) by mouth daily.  ? aspirin EC 81 MG tablet Take 81 mg by mouth daily. Swallow whole.  ? atorvastatin (LIPITOR)  80 MG tablet Take 1 tablet (80 mg total) by mouth at bedtime.  ? Calcium Carb-Cholecalciferol (CALCIUM 500 +D PO) Take 1 tablet by mouth daily.   ? Ferrous Sulfate (IRON) 325 (65 Fe) MG TABS Take 1 tablet by mouth daily.  ? Melatonin 10 MG TABS Take 1 tablet by mouth at bedtime as needed.  ? meloxicam (MOBIC) 15 MG tablet Take 15 mg by mouth as needed.  ? metoprolol succinate (TOPROL-XL) 25 MG 24 hr tablet Take 25 mg by mouth daily.  ? Multiple Vitamins-Minerals (WOMENS MULTIVITAMIN PO) Take 1 tablet by mouth daily.  ? ondansetron (ZOFRAN) 4 MG tablet Take 4 mg by mouth every 8 (eight) hours as needed for nausea or vomiting.  ? sertraline (ZOLOFT) 50 MG tablet Take 50 mg by mouth daily.  ?  ? ?Allergies:   Patient has no known allergies.  ? ?Social History  ? ?Socioeconomic History  ? Marital status: Married  ?  Spouse name: Not on file  ? Number of children: Not on file  ? Years of education: Not on file  ? Highest education level: Not on file  ?Occupational History  ? Not on file  ?Tobacco Use  ? Smoking status: Never  ? Smokeless tobacco: Never  ?Vaping Use  ? Vaping Use: Never used  ?Substance and Sexual Activity  ? Alcohol use: No  ? Drug use: Never  ? Sexual activity: Not on file  ?  Other Topics Concern  ? Not on file  ?Social History Narrative  ? Not on file  ? ?Social Determinants of Health  ? ?Financial Resource Strain: Not on file  ?Food Insecurity: Not on file  ?Transportation Needs: Not on file  ?Physical Activity: Not on file  ?Stress: Not on file  ?Social Connections: Not on file  ?  ? ?Family History: ?The patient's family history is not on file. ? ?ROS:   ?Please see the history of present illness.    ?All other systems reviewed and are negative. ? ?EKGs/Labs/Other Studies Reviewed:   ? ?The following studies were reviewed today: ? ?August 11, 2020 echo ?Left ventricular function normal, 65% ?Right ventricular function normal ?No significant valvular abnormalities ? ? ? ? ?Recent Labs: ?08/10/2020: ALT  18 ?08/12/2020: BUN 16; Creatinine, Ser 0.99; Hemoglobin 13.0; Platelets 183; Potassium 4.0; Sodium 137  ?Recent Lipid Panel ?   ?Component Value Date/Time  ? CHOL 220 (H) 08/11/2020 0636  ? TRIG 176 (H) 08/11/2020 0636  ? HDL 42 08/11/2020 0636  ? CHOLHDL 5.2 08/11/2020 0636  ? VLDL 35 08/11/2020 0636  ? LDLCALC 143 (H) 08/11/2020 0636  ? ? ?Physical Exam:   ? ?VS:  BP 140/80 (BP Location: Left Arm, Patient Position: Sitting, Cuff Size: Normal)   Pulse 74   Ht '5\' 4"'$  (1.626 m)   Wt 197 lb (89.4 kg)   SpO2 95%   BMI 33.81 kg/m?    ? ?Wt Readings from Last 3 Encounters:  ?04/22/21 197 lb (89.4 kg)  ?08/10/20 175 lb (79.4 kg)  ?09/10/19 170 lb (77.1 kg)  ?  ? ?GEN:  Well nourished, well developed in no acute distress ?HEENT: Normal ?NECK: No JVD; No carotid bruits ?LYMPHATICS: No lymphadenopathy ?CARDIAC: RRR, no murmurs, rubs, gallops ?RESPIRATORY:  Clear to auscultation without rales, wheezing or rhonchi  ?ABDOMEN: Soft, non-tender, non-distended ?MUSCULOSKELETAL:  No edema; No deformity  ?SKIN: Warm and dry ?NEUROLOGIC:  Alert and oriented x 3 ?PSYCHIATRIC:  Normal affect  ? ? ?  ? ?ASSESSMENT:   ? ?1. Cryptogenic stroke ([place])   ? ?PLAN:   ? ?In order of problems listed above: ? ?#Cryptogenic stroke ?Multiple cardioembolic appearing strokes on brain imaging.  No atrial fibrillation has been detected thus far on wearable monitors.  She was referred for loop recorder which I think is very reasonable.  I discussed the loop recorder monitoring process with the patient during today's visit include the risks and monthly costs and she is interested in proceeding.  We will get this done for today. ? ?Medication Adjustments/Labs and Tests Ordered: ?Current medicines are reviewed at length with the patient today.  Concerns regarding medicines are outlined above.  ?No orders of the defined types were placed in this encounter. ? ?No orders of the defined types were placed in this encounter. ? ? ? ?Signed, ?Lysbeth Galas T.  Quentin Ore, MD, [facility], [place] ?04/22/2021 2:55 PM    ?Electrophysiology ?[place] ? ?----------------------------------------------------------------- ? ?SURGEON:  Lars Mage, MD ?   ?PREPROCEDURE DIAGNOSIS:  Cryptogenic stroke ?   ?POSTPROCEDURE DIAGNOSIS:  Cryptogenic stroke ?   ? PROCEDURES:  ? 1. Implantable loop recorder implantation ?   ?INTRODUCTION:  DYMOND GUTT is a 66 y.o. patient with a history of cryptogenic stroke. Inpatient telemetry has been reviewed and not shown atrial fibrillation. The patient therefore presents today for implantable loop implantation. The costs of loop recorder monitoring have been discussed with the patient. ?   ?DESCRIPTION  OF PROCEDURE:  Informed written consent was obtained.  The patient required no sedation for the procedure today.  Mapping over the patient's chest was performed to identify the area where electrograms were most prominent for ILR recording.  This area was found to be the left parasternal region over the 4th intercostal space. ?The patients left chest was therefore prepped and draped in the usual sterile fashion. The skin overlying the left parasternal region was infiltrated with lidocaine for local analgesia.  A 0.5-cm incision was made over the left parasternal region over the 3rd intercostal space.  A subcutaneous ILR pocket was fashioned using a combination of sharp and blunt dissection.  A [employer] Lux DX  [id number]) implantable loop recorder was then placed into the pocket  R waves were very prominent and measured >0.73m.  Steri- Strips and a sterile dressing were then applied.  There were no early apparent complications.  ?   ?CONCLUSIONS:  ? 1. Successful implantation of a [employer]Lux DX implantable loop recorder for a history of cryptogenic stroke ? 2. No early apparent complications.  ? ?CLars Mage MD ?03/17/2020 ?3:51 PM ? ? ?

## 2021-04-25 ENCOUNTER — Encounter: Payer: Self-pay | Admitting: Occupational Therapy

## 2021-04-25 NOTE — Therapy (Signed)
[facility] ?[place], [name], [address] ?Phone: [phone]   Fax:  [phone] ? ?Occupational Therapy Treatment/Recertification ? ? ?Patient Details  ?Name: Kim Gomez ?MRN: [id number] ?Date of Birth: 1955/11/14 ?Referring Provider (OT): Manuella Ghazi, H ? ? ?Encounter Date: 04/22/2021 ? ? OT End of Session - 04/25/21 2058   ? ? Visit Number 16   ? Number of Visits 48   ? Date for OT Re-Evaluation 07/15/21   ? OT Start Time 1015   ? OT Stop Time 1100   ? OT Time Calculation (min) 45 min   ? Activity Tolerance Patient limited by pain   ? Behavior During Therapy [facility] for tasks assessed/performed   ? ?  ?  ? ?  ? ? ?Past Medical History:  ?Diagnosis Date  ? Anxiety   ? Arthritis   ? legs, hands  ? Cellulitis of right knee   ? started on antibiotics 06/21/15.    ? Depression   ? Family history of adverse reaction to anesthesia   ? sister and mom -PONV  ? Hypercholesteremia   ? Hypertension   ? ? ?Past Surgical History:  ?Procedure Laterality Date  ? ABDOMINAL HYSTERECTOMY    ? COLONOSCOPY WITH PROPOFOL N/A 06/27/2015  ? Procedure: COLONOSCOPY WITH PROPOFOL;  Surgeon: Lucilla Lame, MD;  Location: [facility];  Service: Endoscopy;  Laterality: N/A;  ? POLYPECTOMY  06/27/2015  ? Procedure: POLYPECTOMY;  Surgeon: Lucilla Lame, MD;  Location: [facility] [place];  Service: Endoscopy;;  ? TOTAL HIP ARTHROPLASTY Right 09/10/2019  ? Procedure: TOTAL HIP ARTHROPLASTY ANTERIOR APPROACH;  Surgeon: Lovell Sheehan, MD;  Location: [facility];  Service: Orthopedics;  Laterality: Right;  ? ? ?There were no vitals filed for this visit. ? ? Subjective Assessment - 04/25/21 2056   ? ? Subjective  Pt reports Washing her back is still difficult, Reaching to get items out of the cabinet and closet, When on the passenger side, opening and closing the door, Vacuuming , Mopping, Managing heavy pot/pan, lifting bag of fertilizer 15# , Picking up a case of water   ? Pertinent  History Kim Gomez is a 66 y.o. female with PMH significant for HTN, HLD, anxiety disorder, recently hospitalized for COVID-19 infection (08/06/2020) at an outside facility. Patient was brought to the ED on 7/24 after she had a sudden onset of numbness involving right side of her body without any motor deficits, dysarthria, dysphagia. 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 Per chart for her f/u neuro visit:Left ischemic thalamic/internal capsule infarct presenting with right-sided numbness, weakness - now with residual cognitive fog (improving), imbalance (improving), fatigue, right first + second digit numbness, in patient with known vascular risk factors of hypertension, dyslipidemia, lack of physical activity, snoring   ? Patient Stated Goals Pt reports she wants to get back to doing things more independently like she was before COVID and CVA.   ? Currently in Pain? Yes   ? Pain Score 2    ? Pain Location Shoulder   ? Pain Orientation Right   ? Pain Descriptors / Indicators Tender   ? Pain Type Chronic pain   ? Pain Onset More than a month ago   ? Pain Frequency Intermittent   ? ?  ?  ? ?  ? ? ? ? ? [facility] OT Assessment - 04/25/21 2053   ? ?  ? Assessment  ? Medical  Diagnosis CVA   ? Referring Provider (OT) Manuella Ghazi, H   ? Onset Date/Surgical Date 08/10/20   ? Hand Dominance Right   ?  ? Observation/Other Assessments  ? Focus on Therapeutic Outcomes (FOTO)  64   ?  ? Coordination  ? Right 9 Hole Peg Test 25   ? Left 9 Hole Peg Test 25   ?  ? Hand Function  ? Right Hand Grip (lbs) 50   ? Right Hand Lateral Pinch 20 lbs   ? Right Hand 3 Point Pinch 16 lbs   ? Left Hand Grip (lbs) 60   ? Left Hand Lateral Pinch 18 lbs   ? Left 3 point pinch 16 lbs   ? Comment 2 point pinch right 14#, left 12   ? ?  ?  ? ?  ? ? ?Pt seen for reassessment of hand function and UE, refer to flow sheet above for measurements.  ?ADL reassessment with Discussion of progress  and personal goals, updated POC. ? ?Active movement of RUE: 128 degrees on right shoulder flex  ?ABD 136 ? ?Neuro: ?9 hole peg test as above ?Pt seen for focus on dexterity with use of jamar tweezers test, board placed  on larger wedge to encourage reach, occasional cues for prehension patterns.  Mild shoulder discomfort after repetitions.  Removed from wedge and completed removal of pins from flat surface.   ? ?Response to tx: ?Pt has continued to demonstrate excellent progress in all areas.  Her shoulder pain has decreased, at rest she did not have any pain this date, 2/10 with tasks involving right UE.  Pt improved grip strength and pinch significantly in right UE and is able to open most containers and jars.  Pt is modified independent with basic self care tasks but still has some difficulty with reaching to wash her back.  She indicated personal goals and areas of focus this date as:  ?Washing her back is still difficult, Reaching to get items out of the cabinet and closet, When on the passenger side, opening and closing the door, Vacuuming , Mopping, Managing heavy pot/pan, lifting bag of fertilizer 15# , Picking up a case of water.  Goals updated to reflect progress and to address personal goals.  Pt continues to benefit from skilled OT services to maximize safety and independence in necessary daily tasks at home and in the community.   ? ? ? ? ? ? ? ? ? ? ? ? ? ? ? ? ? OT Education - 04/25/21 2058   ? ? Education Details reassessment, goals, progress   ? Person(s) Educated Patient   ? Methods Explanation   ? Comprehension Verbalized understanding;Returned demonstration;Verbal cues required;Need further instruction   ? ?  ?  ? ?  ? ? ? ? ? ? OT Long Term Goals - 04/25/21 2101   ? ?  ? OT LONG TERM GOAL #1  ? Title Patient will be independent with home exercise program   ? Baseline 03/23/2021: Pt. is independent, 4/5: will continue to upgrade exercises   ? Time 12   ? Period Weeks   ? Status On-going   ? Target  Date 07/15/21   ?  ? OT LONG TERM GOAL #2  ? Title Patient will complete basic self-care tasks with modified independence and without increased time to complete tasks   ? Baseline Patient has difficulty with washing her back.  Pt. requires increased time to complete self-care tasks.4/5:  Pt still has some  difficulty with reaching to wash her back.   ? Time 6   ? Period Weeks   ? Status Partially Met   ? Target Date 07/15/21   ?  ? OT LONG TERM GOAL #3  ? Title Patient will demonstrate good safety awareness in the kitchen with cooking tasks with modified independence   ? Baseline Pt. requires Supervision for safety awareness.  4/5 goal upgraded to modified independence   ? Time 12   ? Period Weeks   ? Status Revised   ? Target Date 04/25/21   ?  ? OT LONG TERM GOAL #4  ? Title Patient will increase right grip strength by 10 pounds to be able to open jars and containers with modified independence   ? Baseline 28 pounds at eval and difficulty with managing jars and containers. 10th visit: R: 34# Jars,a nd containers continue to be difficulty to open.  4/5: right grip 50#   ? Time 12   ? Period Weeks   ? Status Achieved   ? Target Date 04/25/21   ?  ? OT LONG TERM GOAL #5  ? Title Patient will improve right pinch strength by 5 pounds to pick up and manipulate items with modified independence   ? Baseline Right lateral 5 pounds, 3 point 4#, 10th visit: right lateral pinch 13#   ? Time 12   ? Period Weeks   ? Status Achieved   ? Target Date 04/25/21   ?  ? Long Term Additional Goals  ? Additional Long Term Goals Yes   ?  ? OT LONG TERM GOAL #6  ? Title Patient will decrease right shoulder pain to 2 out of 10 or less with movement patterns for daily activities.   ? Baseline Increased pain in right shoulder, 5 out of 10 with movement. 10th visit: 8/10 right shoulder pain, 4/5:  2/10 with movement today but has increased pain other days and not yet consistent.   ? Time 12   ? Period Weeks   ? Status On-going   ? Target Date  07/15/21   ?  ? OT LONG TERM GOAL #7  ? Title Patient will demonstrate score of 59 or greater on FOTO to show a clinically relevant change to impact self-care activities with greater independence   ? [employer]

## 2021-04-27 ENCOUNTER — Ambulatory Visit: Payer: No Typology Code available for payment source | Admitting: Occupational Therapy

## 2021-04-27 ENCOUNTER — Ambulatory Visit: Payer: No Typology Code available for payment source | Admitting: Physical Therapy

## 2021-04-27 ENCOUNTER — Telehealth: Payer: Self-pay | Admitting: Cardiology

## 2021-04-27 NOTE — Telephone Encounter (Signed)
? ?  Pre-operative Risk Assessment  ?  ?Patient Name: Kim Gomez  ?DOB: 1955-01-22 ?MRN: [id number]  ? ?  ? ?Request for Surgical Clearance   ? ?Procedure:   Cataract Extraction by PE, IOL - Right  ? ?Date of Surgery:  Clearance TBD                              ?   ?Surgeon:  Olean Ree ?Surgeon's Group or Practice Name:  [facility]  ?Phone number:  [phone] ex [phone] ?Fax number:  [phone] ?  ?Type of Clearance Requested:   ?- Medical  ?  ?Type of Anesthesia:   IV Sedation  ?  ?Additional requests/questions:   ? ?Signed, ?Caryl Pina Gerringer   ?04/27/2021, 4:13 PM  ? ?

## 2021-04-27 NOTE — Telephone Encounter (Signed)
? ?  Patient Name: Kim Gomez  ?DOB: 10-20-1955 ?MRN: [id number] ? ?Primary Cardiologist: None ? ?Chart reviewed as part of pre-operative protocol coverage. Cataract extractions are recognized in guidelines as low risk surgeries that do not typically require specific preoperative testing or holding of blood thinner therapy. Therefore, given past medical history and time since last visit, based on ACC/AHA guidelines, Kim Gomez would be at acceptable risk for the planned procedure without further cardiovascular testing.  ? ?I will route this recommendation to the requesting party via Epic fax function and remove from pre-op pool. ? ?Please call with questions. ? ?Emmaline Life, NP-C ? ?  ?04/27/2021, 4:26 PM ?[facility] ?[address], [address] ?Office [phone] Fax [phone] ? ? ?

## 2021-04-29 ENCOUNTER — Encounter: Payer: Self-pay | Admitting: Occupational Therapy

## 2021-04-29 ENCOUNTER — Ambulatory Visit: Payer: No Typology Code available for payment source | Admitting: Occupational Therapy

## 2021-04-29 ENCOUNTER — Ambulatory Visit: Payer: No Typology Code available for payment source | Admitting: Speech Pathology

## 2021-04-29 ENCOUNTER — Ambulatory Visit: Payer: No Typology Code available for payment source | Admitting: Physical Therapy

## 2021-04-29 DIAGNOSIS — G8929 Other chronic pain: Secondary | ICD-10-CM

## 2021-04-29 DIAGNOSIS — M6281 Muscle weakness (generalized): Secondary | ICD-10-CM

## 2021-04-29 DIAGNOSIS — R278 Other lack of coordination: Secondary | ICD-10-CM

## 2021-04-29 DIAGNOSIS — M25611 Stiffness of right shoulder, not elsewhere classified: Secondary | ICD-10-CM

## 2021-04-29 DIAGNOSIS — R41841 Cognitive communication deficit: Secondary | ICD-10-CM

## 2021-04-29 NOTE — Telephone Encounter (Signed)
Returned the call to the pt, she has been made aware that clearance has been sent over for her to have her Cataract procedure done. ? ?Pt was grateful for the call back.  ?

## 2021-04-29 NOTE — Telephone Encounter (Signed)
Pt calling to f/u on Clearance. Pt would like a call back letting her know if she is cleared or not. Please advise ?

## 2021-04-29 NOTE — Therapy (Signed)
?[facility] ?[place], [name], [address] ?Phone: [phone]   Fax:  [phone] ? ?Physical Therapy Treatment ? ?Patient Details  ?Name: Kim Gomez ?MRN: [id number] ?Date of Birth: 13-Apr-1955 ?Referring Provider (PT): Jennings Books MD ? ? ?Encounter Date: 04/29/2021 ? ? PT End of Session - 04/29/21 0816   ? ? Visit Number 17   ? Number of Visits 24   ? Date for PT Re-Evaluation 05/18/21   ? Progress Note Due on Visit 20   ? PT Start Time 0802   ? PT Stop Time [phone]   ? PT Time Calculation (min) 40 min   ? Equipment Utilized During Treatment Gait belt   ? Activity Tolerance Patient tolerated treatment well   ? Behavior During Therapy [facility] for tasks assessed/performed   ? ?  ?  ? ?  ? ? ?Past Medical History:  ?Diagnosis Date  ? Anxiety   ? Arthritis   ? legs, hands  ? Cellulitis of right knee   ? started on antibiotics 06/21/15.    ? Depression   ? Family history of adverse reaction to anesthesia   ? sister and mom -PONV  ? Hypercholesteremia   ? Hypertension   ? ? ?Past Surgical History:  ?Procedure Laterality Date  ? ABDOMINAL HYSTERECTOMY    ? COLONOSCOPY WITH PROPOFOL N/A 06/27/2015  ? Procedure: COLONOSCOPY WITH PROPOFOL;  Surgeon: Lucilla Lame, MD;  Location: [facility];  Service: Endoscopy;  Laterality: N/A;  ? POLYPECTOMY  06/27/2015  ? Procedure: POLYPECTOMY;  Surgeon: Lucilla Lame, MD;  Location: [facility];  Service: Endoscopy;;  ? TOTAL HIP ARTHROPLASTY Right 09/10/2019  ? Procedure: TOTAL HIP ARTHROPLASTY ANTERIOR APPROACH;  Surgeon: Lovell Sheehan, MD;  Location: [facility];  Service: Orthopedics;  Laterality: Right;  ? ? ?There were no vitals filed for this visit. ? ? Subjective Assessment - 04/29/21 0814   ? ? Subjective Pt reports no shoulder pain at this time. [place] having difficulty with maintaining shoulder flexion for functional activities.   ? Patient is accompained by: Family member   ? Pertinent History Pt had  left thamalmic/interanl capsule infarct on 08/10/20 which has reasulted in right sided weakness and reports of imbalance. Pt reports a fall on october 29th. Pt rpeorts she hurt the R UE with the fall and she reports some stiffness in the knee as well as general weakness on the right side. Pt reports she has had 2 strokes but the first went under the radar and was only evidenced by imaging presented to her by her MD. Pt reports she has been modifying her activities in order to be careful not to fall since the stroke. Reports she has been taking her time with a lot of things. Pt reports she has a membership at [employer] and she has been walking on the treadmill 2-3 times per week, she reports she walks on the treadmill for an hour at a time following working her way up. Pt reports ocassional numbness and tingling in her hands.   ? Limitations House hold activities   ? How long can you walk comfortably? 1 hour with UE support   ? Patient Stated Goals Improve her shoulder pain and function on the right side   ? Currently in Pain? No/denies   ? Pain Onset More than a month ago   ? ?  ?  ? ?  ? ? ? ? ? ?INTERVENTIONS ?  ?  Manual: ?pt supine on plinth as PT provides the following interventions- ?PROM R shoulder: flexion, abduction, ER/IR x multiple reps of each with UE held at end range.  ?AP and PA GH grade 2-3 joint mobilizations x 1-2 min  ?[place] distraction x 10 sec holds, gentle ?PT provides STM and TrP release, R posterior shoulder, R pec minor musculature x several minutes   ?- fewer and less painfeul TrP noted this session   ? ?Therex: ? ?Pt transitions to L SL, towel roll added under R arm ?2 x 10 shoulder ER with 1 #  ?-cues for ROM  ? ?Serratus punch with 3# with shoulder flexion AROM from 90 to 120 holding serratus activaation ?X 10 ?Cues to maintain shoulder muscle activation  ? ?SL R shoulder abduction  ?2 x 5 with external cues for proper movement pattern, cues for shoulder orientation  ? ?PVC exercise: R  shoulder flexion 10x through available ROM. In supine with 5 sec holds ? ?Lower tra amd middle trap strengthening ?Shoulder extensions with GTB tied at top of doorway  ?2 x 10 with cues for proper movemnt pattern ? ?B shoulder HABD with YTB x 10  ?-cues for " shoulder blade squeze" to improve activation of target musculature  ? ? ? ? ?  ?Pt educated throughout session about proper posture and technique with exercises. Improved exercise technique, movement at target joints, use of target muscles after min to mod verbal, visual, tactile cues. ? ? ?Note: Portions of this document were prepared using Dragon voice recognition software and although reviewed may contain unintentional dictation errors in syntax, grammar, or spelling. ? ? ? ? ? ? ? ? ? ? ? ? ? ? ? ? ? ? ? ? ? ? ? ? ? ? ? PT Education - 04/29/21 0815   ? ? Education Details upward rotation force coupling of musculature and improtance of strengthening for proper shoulder functioning   ? Person(s) Educated Patient   ? Methods Explanation   ? Comprehension Verbalized understanding   ? ?  ?  ? ?  ? ? ? PT Short Term Goals - 03/23/21 1656   ? ?  ? PT SHORT TERM GOAL #1  ? Title Patient will be independent in home exercise program to improve strength/mobility for better functional independence with ADLs.   ? Baseline no HEP at this time   ? Time 4   ? Period Weeks   ? Status Achieved   ? Target Date 02/17/21   ?  ? PT SHORT TERM GOAL #2  ? Title Patient will be independent in progressive shoulder home exercise program in order to improve her shoulder strength and function   ? Baseline Patient has initial home exercise program and is comfortable with that but it is yet to be progressed for higher level activities   ? Time 4   ? Period Weeks   ? Status New   ? Target Date 04/20/21   ? ?  ?  ? ?  ? ? ? ? PT Long Term Goals - 03/23/21 1657   ? ?  ? PT LONG TERM GOAL #1  ? Title Patient will increase FOTO score to equal to or greater than  50   to demonstrate  statistically significant improvement in mobility and quality of life.   ? Baseline 41 on 01/20/21   ? Time 8   ? Period Weeks   ? Status Achieved   ? Target Date 03/17/21   ?  ?  PT LONG TERM GOAL #2  ? Title Pt will improve mini BEST test by 4 points or more in order to indicate clinically significant improvent in balance.   ? Baseline see flowsheets 27 on 2/27   ? Time 12   ? Period Weeks   ? Status Achieved   ? Target Date 04/14/21   ?  ? PT LONG TERM GOAL #3  ? Title Patient  will complete five times sit to stand test in < 15 seconds indicating an increased LE strength and improved balance.   ? Baseline 13.9 sec on 2/27   ? Time 12   ? Period Weeks   ? Status Achieved   ? Target Date 04/14/21   ?  ? PT LONG TERM GOAL #4  ? Title Patient will increase 10 meter walk test to >1.23ms as to improve gait speed for better community ambulation and to reduce fall risk.   ? Baseline .86 m/s on 01/20/21 2/27: 1.27 m/s   ? Time 8   ? Period Weeks   ? Status Achieved   ? Target Date 03/17/21   ?  ? PT LONG TERM GOAL #5  ? Title Pty will improve dual task TUG to less than 20 seconds in order to indicate improved cognitive and motor tasks.   ? Baseline 28.82 on 01/20/21 14.98 sec on 2/27   ? Time 12   ? Period Weeks   ? Status Achieved   ? Target Date 04/14/21   ?  ? Additional Long Term Goals  ? Additional Long Term Goals Yes   ?  ? PT LONG TERM GOAL #6  ? Title Patient will improve right shoulder by 1 increment on manual muscle test grading scale in order to indicate improved right upper extremity strength.   ? Baseline 3-/5 her right shoulder flexion and abduction(due to range of motion restriction) 3+ with external rotation at 0 degrees abduction   ? Time 8   ? Period Weeks   ? Status New   ? Target Date 05/18/21   ?  ? PT LONG TERM GOAL #7  ? Title Patient will improve QuickDASH score by 12.5 points in order to indicate significantly improved subjective rating of right shoulder function   ? Baseline 50% on 03/23/2021   ? Time 8    ? Period Weeks   ? Status New   ? Target Date 05/18/21   ?  ? PT LONG TERM GOAL #8  ? Title Patient will improve right shoulder active range of motion by 30 degrees with elevation and by 15 degrees with external r

## 2021-04-29 NOTE — Therapy (Signed)
[place] [facility] ?[place], [name], [address] ?Phone: [phone]   Fax:  [phone] ? ?Occupational Therapy Treatment ? ?Patient Details  ?Name: Kim Gomez ?MRN: [id number] ?Date of Birth: 07-May-1955 ?Referring Provider (OT): Manuella Ghazi, H ? ? ?Encounter Date: 04/29/2021 ? ? OT End of Session - 04/29/21 1028   ? ? Visit Number 17   ? Number of Visits 48   ? Date for OT Re-Evaluation 07/15/21   ? OT Start Time 1015   ? OT Stop Time 1100   ? OT Time Calculation (min) 45 min   ? Activity Tolerance Patient limited by pain   ? Behavior During Therapy [facility] for tasks assessed/performed   ? ?  ?  ? ?  ? ? ?Past Medical History:  ?Diagnosis Date  ? Anxiety   ? Arthritis   ? legs, hands  ? Cellulitis of right knee   ? started on antibiotics 06/21/15.    ? Depression   ? Family history of adverse reaction to anesthesia   ? sister and mom -PONV  ? Hypercholesteremia   ? Hypertension   ? ? ?Past Surgical History:  ?Procedure Laterality Date  ? ABDOMINAL HYSTERECTOMY    ? COLONOSCOPY WITH PROPOFOL N/A 06/27/2015  ? Procedure: COLONOSCOPY WITH PROPOFOL;  Surgeon: Lucilla Lame, MD;  Location: [place];  Service: Endoscopy;  Laterality: N/A;  ? POLYPECTOMY  06/27/2015  ? Procedure: POLYPECTOMY;  Surgeon: Lucilla Lame, MD;  Location: [place];  Service: Endoscopy;;  ? TOTAL HIP ARTHROPLASTY Right 09/10/2019  ? Procedure: TOTAL HIP ARTHROPLASTY ANTERIOR APPROACH;  Surgeon: Lovell Sheehan, MD;  Location: [facility];  Service: Orthopedics;  Laterality: Right;  ? ? ?There were no vitals filed for this visit. ? ? Subjective Assessment - 04/29/21 1027   ? ? Subjective  Pt. reports that she has made progress   ? Pertinent History Kim Gomez is a 66 y.o. female with PMH significant for HTN, HLD, anxiety disorder, recently hospitalized for COVID-19 infection (08/06/2020) at an outside facility. Patient was brought to the ED on 7/24 after she had a sudden onset  of numbness involving right side of her body without any motor deficits, dysarthria, dysphagia. 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 Per chart for her f/u neuro visit:Left ischemic thalamic/internal capsule infarct presenting with right-sided numbness, weakness - now with residual cognitive fog (improving), imbalance (improving), fatigue, right first + second digit numbness, in patient with known vascular risk factors of hypertension, dyslipidemia, lack of physical activity, snoring   ? Currently in Pain? No/denies   ? Pain Score 3    ? Pain Location Shoulder   ? Pain Orientation Right   ? Pain Descriptors / Indicators Tender   ? Pain Type Chronic pain   ? ?  ?  ? ?  ? ?OT TREATMENT   ? ?Neuro muscular re-education: ? ?Pt. worked on grasping, and manipulating 1/2" washers from a magnetic dish using point grasp pattern. Pt. worked on reaching up, stabilizing, and sustaining shoulder elevation while placing the washer over a small precise target on vertical dowels positioned at various  vertical, and diagonal angles for improved functional reaching. Pt. Worked on [facility] skills grasping 1", 3/4", and 1/2" flat washers, and placing them on the hooks. Pt. worked on [facility] skills using the [employer] Task. Pt. worked on sustaining grasp on the resistive tweezers while grasping this sticks,  and moving them from a horizontal position to a vertical position to prepare for placing them into the pegboard. Pt. Required verbal cues, and cues for visual demonstration for wrist position, and hand pattern when placing them into the pegboard.  ? ?Pt. is making progress with right hand function, and is using her right hand more during cooking, when working on the computer, and writing. Pt. is now able to dry her back independently. Pt. continues to present with limited RUE strength, and [facility] skills. Pt. continues to have difficulty with reaching up, and out  with the RUE. Pt. Tends to drop multiple pegs when attempting translatory movements with the hand. Pt. Was able store the washers in the palm of her hand. Pt. Was able to sustain pinch grasp on the tweezers when picking them up from a horizontal position, and changing them to a vertical position in preparation for placing them into the pegboard. ? ? ? ?  ? ? ? ? ? ? ? ? ? ? ? ? ? ? ? ? ? ? ? ? ? OT Education - 04/29/21 1028   ? ? Education Details RUE functioning   ? Person(s) Educated Patient   ? Methods Explanation   ? Comprehension Verbalized understanding;Returned demonstration;Verbal cues required;Need further instruction   ? ?  ?  ? ?  ? ? ? ? ? ? OT Long Term Goals - 04/25/21 2101   ? ?  ? OT LONG TERM GOAL #1  ? Title Patient will be independent with home exercise program   ? Baseline 03/23/2021: Pt. is independent, 4/5: will continue to upgrade exercises   ? Time 12   ? Period Weeks   ? Status On-going   ? Target Date 07/15/21   ?  ? OT LONG TERM GOAL #2  ? Title Patient will complete basic self-care tasks with modified independence and without increased time to complete tasks   ? Baseline Patient has difficulty with washing her back.  Pt. requires increased time to complete self-care tasks.4/5:  Pt still has some difficulty with reaching to wash her back.   ? Time 6   ? Period Weeks   ? Status Partially Met   ? Target Date 07/15/21   ?  ? OT LONG TERM GOAL #3  ? Title Patient will demonstrate good safety awareness in the kitchen with cooking tasks with modified independence   ? Baseline Pt. requires Supervision for safety awareness.  4/5 goal upgraded to modified independence   ? Time 12   ? Period Weeks   ? Status Revised   ? Target Date 04/25/21   ?  ? OT LONG TERM GOAL #4  ? Title Patient will increase right grip strength by 10 pounds to be able to open jars and containers with modified independence   ? Baseline 28 pounds at eval and difficulty with managing jars and containers. 10th visit: R: 34# Jars,a  nd containers continue to be difficulty to open.  4/5: right grip 50#   ? Time 12   ? Period Weeks   ? Status Achieved   ? Target Date 04/25/21   ?  ? OT LONG TERM GOAL #5  ? Title Patient will improve right pinch strength by 5 pounds to pick up and manipulate items with modified independence   ? Baseline Right lateral 5 pounds, 3 point 4#, 10th visit: right lateral pinch 13#   ? Time 12   ? Period Weeks   ? Status Achieved   ?  Target Date 04/25/21   ?  ? Long Term Additional Goals  ? Additional Long Term Goals Yes   ?  ? OT LONG TERM GOAL #6  ? Title Patient will decrease right shoulder pain to 2 out of 10 or less with movement patterns for daily activities.   ? Baseline Increased pain in right shoulder, 5 out of 10 with movement. 10th visit: 8/10 right shoulder pain, 4/5:  2/10 with movement today but has increased pain other days and not yet consistent.   ? Time 12   ? Period Weeks   ? Status On-going   ? Target Date 07/15/21   ?  ? OT LONG TERM GOAL #7  ? Title Patient will demonstrate score of 59 or greater on FOTO to show a clinically relevant change to impact self-care activities with greater independence   ? Baseline Score of 55 at eval, 10th visit: FOTO score: 51, increased shoulder  pain limiting overhead activity.  4/5:  score of 64   ? Time 12   ? Period Weeks   ? Status Achieved   ? Target Date 04/25/21   ?  ? OT LONG TERM GOAL #8  ? Title Will assess for potential return to work tasks as patient progresses with therapy   ? Baseline Patient currently unable to perform her job as outlined in her job description   ? Time 12   ? Period Weeks   ? Status On-going   ? Target Date 07/15/21   ?  ? OT LONG TERM GOAL  #9  ? TITLE Pt will improve strength to pick up a case of water to place into her grocery cart   ? Baseline 4/5: unable   ? Time 12   ? Period Weeks   ? Status New   ? Target Date 07/15/21   ?  ? OT LONG TERM GOAL  #10  ? TITLE When on the passenger side, pt will be able to close door with modified  independence   ? Baseline 4/5:  difficulty at times   ? Time 6   ? Period Weeks   ? Status New   ? Target Date 06/06/21   ?  ? OT LONG TERM GOAL  #11  ? TITLE Pt will demonstrate ability to vacuum carp

## 2021-04-29 NOTE — Therapy (Signed)
[place] [facility] ?[place], [name], [address] ?Phone: [phone]   Fax:  [phone] ? ?Speech Language Pathology Treatment ? ?Patient Details  ?Name: Kim Gomez ?MRN: [id number] ?Date of Birth: 21-Jul-1955 ?Referring Provider (SLP): Dr. Jennings Books ? ? ?Encounter Date: 04/29/2021 ? ? End of Session - 04/29/21 1041   ? ? Visit Number 21   ? Number of Visits 25   ? Date for SLP Re-Evaluation 06/24/21   ? Authorization - Visit Number 1   ? Progress Note Due on Visit 10   ? SLP Start Time 0900   ? SLP Stop Time  1000   ? SLP Time Calculation (min) 60 min   ? Activity Tolerance Patient tolerated treatment well   ? ?  ?  ? ?  ? ? ?Past Medical History:  ?Diagnosis Date  ? Anxiety   ? Arthritis   ? legs, hands  ? Cellulitis of right knee   ? started on antibiotics 06/21/15.    ? Depression   ? Family history of adverse reaction to anesthesia   ? sister and mom -PONV  ? Hypercholesteremia   ? Hypertension   ? ? ?Past Surgical History:  ?Procedure Laterality Date  ? ABDOMINAL HYSTERECTOMY    ? COLONOSCOPY WITH PROPOFOL N/A 06/27/2015  ? Procedure: COLONOSCOPY WITH PROPOFOL;  Surgeon: Lucilla Lame, MD;  Location: [place];  Service: Endoscopy;  Laterality: N/A;  ? POLYPECTOMY  06/27/2015  ? Procedure: POLYPECTOMY;  Surgeon: Lucilla Lame, MD;  Location: [place];  Service: Endoscopy;;  ? TOTAL HIP ARTHROPLASTY Right 09/10/2019  ? Procedure: TOTAL HIP ARTHROPLASTY ANTERIOR APPROACH;  Surgeon: Lovell Sheehan, MD;  Location: [facility];  Service: Orthopedics;  Laterality: Right;  ? ? ?There were no vitals filed for this visit. ? ? Subjective Assessment - 04/29/21 1011   ? ? Subjective "I'm on point today"   ? Currently in Pain? No/denies   ? Pain Score 0-No pain   ? ?  ?  ? ?  ? ? ? ? ? ? ? ? ADULT SLP TREATMENT - 04/29/21 1020   ? ?  ? General Information  ? Behavior/Cognition Alert;Cooperative;Pleasant mood   ? HPI Patient is a 66 y.o. female who  admitted 08/10/20 and found to have Covid-19 as well as left ischemic thalamic/internal capsule infarct. Acute SLP screened and s/o; pt now with residual cognitive fog, imbalance, fatigue, right first + second digit numbness.   ?  ? Cognitive-Linquistic Treatment  ? Treatment focused on Cognition;Patient/family/caregiver education   ? Skilled Treatment Pt initiated review of her calendar to recall her previous week/Easter weekend. During task, pt reports forgetting to cancel her hair appointment last week and realized she forgot to enter the new appointment in her calendar. Pt reviewed updated therapy schedule and entered appointments in calendar with min question cues. Pt independently opened her checklist she created in her previous session and initiated her meal planning. Pt chose days to cook in her calendar, what meals to cook, and created a grocery list with min cues. During task, pt receptive to redirection x2; pt lost focus due to thinking about other events she has in her planner. Targeted thought organization with guided reading article (Memory Loss after Stroke). Pt read article and highlighted relevant information/ though provoking information from the article. Discussed the impact of memory loss on her daily life and usuage of strategies to help improve memory. With min-mod cues, pt  took notes during discussion. Pt educated on increased risk of dementia due to stroke, and the difference between dementia and memory loss post-stroke.   ?  ? Assessment / Recommendations / Plan  ? Plan Continue with current plan of care   ?  ? Progression Toward Goals  ? Progression toward goals Progressing toward goals   ? ?  ?  ? ?  ? ? ? SLP Education - 04/29/21 1040   ? ? Education Details Using memory strategies to aid in daily tasks   ? Person(s) Educated Patient   ? Methods Explanation   ? Comprehension Verbalized understanding   ? ?  ?  ? ?  ? ? ? SLP Short Term Goals - 03/26/21 1247   ? ?  ? SLP SHORT TERM GOAL #1   ? Title Pt will verbalize and demonstrate how to implement 2 memory and attention strategies to aid daily functioning given occasional min A over 2 sessions   ? Status Achieved   ?  ? SLP SHORT TERM GOAL #2  ? Title Pt will manage finances with use of compensations with no reported episodes of missed bills given rare min A   ? Status Achieved   ?  ? SLP SHORT TERM GOAL #3  ? Title Pt will identify errors in writing and self-correct with 80% accuracy given rare min A over 2 sessions   ? Status Achieved   ?  ? SLP SHORT TERM GOAL #4  ? Title Pt will participate in further language assessment as needed (goals to be added)   ? Status Achieved   ? ?  ?  ? ?  ? ? ? SLP Long Term Goals - 04/22/21 1215   ? ?  ? SLP LONG TERM GOAL #1  ? Title Pt will verbalize and implement 4 attention and memory strategies to aid daily functioning for ADLs and IADLs with rare min A over 2 sessions   ? Time 12   ? Period Weeks   ? Status On-going   ? Target Date 06/24/21   ?  ? SLP LONG TERM GOAL #2  ? Title Pt will identify and correct paraphasias in conversation and writing with 90% accuracy given rare min A over 2 sessions   ? Time 12   ? Period Weeks   ? Status Achieved   ? Target Date 06/24/21   ?  ? SLP LONG TERM GOAL #3  ? Title Pt will use compensations for thought organization effectively in 20 minutes mod complex conversation.   ? Time 12   ? Period Weeks   ? Status On-going   ? Target Date 06/24/21   ?  ? SLP LONG TERM GOAL #4  ? Title Patient will maintain alternating attention between 2 mod complex tasks for 15 minutes with modified independence (strategies allowed).   ? Time 12   ? Period Weeks   ? Status On-going   ? Target Date 06/24/21   ?  ? SLP LONG TERM GOAL #5  ? Title Patient will use strategies and executive function aid to preplan and manage IADLs independently.   ? Time 12   ? Period Weeks   ? Status On-going   ? Target Date 06/24/21   ? ?  ?  ? ?  ? ? ? Plan - 04/29/21 1042   ? ? Clinical Impression Statement  Patient presents with mild cognitive communication impairment. Pt with increased affect during last 2 sessions. Pt increasing initiation  of planning to aid in IADLs; decreased cuing required today. Noted pt appearing excited that she was able to initate this task herself.  Pt receptive to using memory/attention strategies, but requires cues to recognize other opportunities to use strategies outside of ST. Pt continues to benefit from assistance with thought organization. Recommend skilled ST to maximize pt's cognitive communication and language skills to improve accuracy/independence with ADLs and IADLs, reduce communication breakdowns, and improve quality of life.   ? Speech Therapy Frequency 2x / week   ? Duration 12 weeks   ? Treatment/Interventions Language facilitation;Environmental controls;SLP instruction and feedback;Cognitive reorganization;Compensatory strategies;Patient/family education;Internal/external aids   ? Potential to Achieve Goals Good   ? SLP Home Exercise Plan see pt instructions   ? Consulted and Agree with Plan of Care Patient   ? ?  ?  ? ?  ? ? ?Patient will benefit from skilled therapeutic intervention in order to improve the following deficits and impairments:   ?Cognitive communication deficit ? ? ? ?Problem List ?Patient Active Problem List  ? Diagnosis Date Noted  ? Acute stroke due to ischemia ([place]) 08/10/2020  ? 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 Special screening for malignant neoplasms, colon   ? Benign neoplasm of ascending colon   ? Benign neoplasm of descending colon   ? ?Caryl Comes Smith-Sneed SLP Student   ? ?Aliene Altes, CCC-SLP ?04/30/2021, 10:15 AM ? ? ?[facility] ?[place], [name], [address] ?Phone: [phone]   Fax:  [phone] ? ? ?Name: Kim Gomez ?MRN: [id number] ?Date of Birth: [birth year] ? ?

## 2021-05-04 ENCOUNTER — Ambulatory Visit: Payer: No Typology Code available for payment source | Admitting: Physical Therapy

## 2021-05-04 ENCOUNTER — Ambulatory Visit: Payer: No Typology Code available for payment source | Admitting: Occupational Therapy

## 2021-05-04 ENCOUNTER — Encounter: Payer: Self-pay | Admitting: Occupational Therapy

## 2021-05-04 DIAGNOSIS — M6281 Muscle weakness (generalized): Secondary | ICD-10-CM

## 2021-05-04 DIAGNOSIS — M25611 Stiffness of right shoulder, not elsewhere classified: Secondary | ICD-10-CM

## 2021-05-04 DIAGNOSIS — R278 Other lack of coordination: Secondary | ICD-10-CM

## 2021-05-04 DIAGNOSIS — G8929 Other chronic pain: Secondary | ICD-10-CM

## 2021-05-04 NOTE — Therapy (Signed)
[place] [facility] ?[place], [name], [address] ?Phone: [phone]   Fax:  [phone] ? ?Occupational Therapy Treatment ? ?Patient Details  ?Name: Kim Gomez ?MRN: [id number] ?Date of Birth: 11-24-1955 ?Referring Provider (OT): Manuella Ghazi, H ? ? ?Encounter Date: 05/04/2021 ? ? OT End of Session - 05/04/21 0924   ? ? Visit Number 18   ? Number of Visits 48   ? Date for OT Re-Evaluation 07/15/21   ? OT Start Time 0845   ? OT Stop Time 0930   ? OT Time Calculation (min) 45 min   ? Activity Tolerance Patient limited by pain   ? Behavior During Therapy [facility] for tasks assessed/performed   ? ?  ?  ? ?  ? ? ?Past Medical History:  ?Diagnosis Date  ? Anxiety   ? Arthritis   ? legs, hands  ? Cellulitis of right knee   ? started on antibiotics 06/21/15.    ? Depression   ? Family history of adverse reaction to anesthesia   ? sister and mom -PONV  ? Hypercholesteremia   ? Hypertension   ? ? ?Past Surgical History:  ?Procedure Laterality Date  ? ABDOMINAL HYSTERECTOMY    ? COLONOSCOPY WITH PROPOFOL N/A 06/27/2015  ? Procedure: COLONOSCOPY WITH PROPOFOL;  Surgeon: Lucilla Lame, MD;  Location: [place];  Service: Endoscopy;  Laterality: N/A;  ? POLYPECTOMY  06/27/2015  ? Procedure: POLYPECTOMY;  Surgeon: Lucilla Lame, MD;  Location: [place];  Service: Endoscopy;;  ? TOTAL HIP ARTHROPLASTY Right 09/10/2019  ? Procedure: TOTAL HIP ARTHROPLASTY ANTERIOR APPROACH;  Surgeon: Lovell Sheehan, MD;  Location: [facility];  Service: Orthopedics;  Laterality: Right;  ? ? ?There were no vitals filed for this visit. ? ? Subjective Assessment - 05/04/21 0923   ? ? Pertinent History Kim Gomez is a 66 y.o. female with PMH significant for HTN, HLD, anxiety disorder, recently hospitalized for COVID-19 infection (08/06/2020) at an outside facility. Patient was brought to the ED on 7/24 after she had a sudden onset of numbness involving right side of her body without  any motor deficits, dysarthria, dysphagia. 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 Per chart for her f/u neuro visit:Left ischemic thalamic/internal capsule infarct presenting with right-sided numbness, weakness - now with residual cognitive fog (improving), imbalance (improving), fatigue, right first + second digit numbness, in patient with known vascular risk factors of hypertension, dyslipidemia, lack of physical activity, snoring   ? Currently in Pain? Yes   ? Pain Score 2    ? Pain Descriptors / Indicators Tender;Sore   ? Pain Type Acute pain   ? ?  ?  ? ?  ? ?OT TREATMENT   ? ?Neuromuscular re-education: ? ?Pt. worked on [facility] skills grasping grooved pegs from a flat horizontal position in a dish, storing them in the palm of her hand, and moving the pegs from the palm of her hand to the tip of her 2nd digit, and thumb in preparation for placing them vertically in the grooved slots. Pt. worked on removing the pegs alternating thumb opposition to the tip of the 2nd through 5th digits.  ? ?Therapeutic Exercise: ? ?Pt. tolerated AROM/AAROM with shoulder flexion, and abduction. Pt. worked on shoulder flexion with reaching to pace 1 &1/2" pegs onto an [employer] school teacher following [employer]. The [employer] board was placed at an incline on a blue wedge  to encourage functional reaching.  ? ?Manual Therapy: ? ?Pt. tolerated soft tissue massage to the right upper trap, scapular musculature, and deltoid secondary to pain, and stiffness. Pt. tolerated scapular mobilizations in elevation, depression, and rotation following moist heat to the right shoulder. Manual therapy was performed independent of, and in preparation for therapeutic ex. and ROM. ?  ?Pt. reports 2/10 pain today in the right shoulder. Pt. reports that she mopped her mother's home on Friday. Pt. tolerated moist heat, and manual therapy without increased pain. Pt. required multiple  rest breaks after reaching to place the pegs at the top of the [employer]. Pt. required cues for the correct completion off the yellow pegs on the design pattern. Pt. continues to work on improving right  shoulder pain, RUE ROM, functional reaching, strength, and [place] skills in order to work towards improving, and maximizing independence with ADLs, and IADL tasks.  ? ? ? ? ? ? ? ? ? ? ? ? ? ? ? ? ? OT Education - 05/04/21 0924   ? ? Education Details RUE functioning   ? Person(s) Educated Patient   ? Methods Explanation   ? Comprehension Verbalized understanding;Returned demonstration;Verbal cues required;Need further instruction   ? ?  ?  ? ?  ? ? ? ? ? ? OT Long Term Goals - 04/25/21 2101   ? ?  ? OT LONG TERM GOAL #1  ? Title Patient will be independent with home exercise program   ? Baseline 03/23/2021: Pt. is independent, 4/5: will continue to upgrade exercises   ? Time 12   ? Period Weeks   ? Status On-going   ? Target Date 07/15/21   ?  ? OT LONG TERM GOAL #2  ? Title Patient will complete basic self-care tasks with modified independence and without increased time to complete tasks   ? Baseline Patient has difficulty with washing her back.  Pt. requires increased time to complete self-care tasks.4/5:  Pt still has some difficulty with reaching to wash her back.   ? Time 6   ? Period Weeks   ? Status Partially Met   ? Target Date 07/15/21   ?  ? OT LONG TERM GOAL #3  ? Title Patient will demonstrate good safety awareness in the kitchen with cooking tasks with modified independence   ? Baseline Pt. requires Supervision for safety awareness.  4/5 goal upgraded to modified independence   ? Time 12   ? Period Weeks   ? Status Revised   ? Target Date 04/25/21   ?  ? OT LONG TERM GOAL #4  ? Title Patient will increase right grip strength by 10 pounds to be able to open jars and containers with modified independence   ? Baseline 28 pounds at eval and difficulty with managing jars and containers. 10th visit: R: 34#  Jars,a nd containers continue to be difficulty to open.  4/5: right grip 50#   ? Time 12   ? Period Weeks   ? Status Achieved   ? Target Date 04/25/21   ?  ? OT LONG TERM GOAL #5  ? Title Patient will improve right pinch strength by 5 pounds to pick up and manipulate items with modified independence   ? Baseline Right lateral 5 pounds, 3 point 4#, 10th visit: right lateral pinch 13#   ? Time 12   ? Period Weeks   ? Status Achieved   ? Target Date 04/25/21   ?  ? Long  Term Additional Goals  ? Additional Long Term Goals Yes   ?  ? OT LONG TERM GOAL #6  ? Title Patient will decrease right shoulder pain to 2 out of 10 or less with movement patterns for daily activities.   ? Baseline Increased pain in right shoulder, 5 out of 10 with movement. 10th visit: 8/10 right shoulder pain, 4/5:  2/10 with movement today but has increased pain other days and not yet consistent.   ? Time 12   ? Period Weeks   ? Status On-going   ? Target Date 07/15/21   ?  ? OT LONG TERM GOAL #7  ? Title Patient will demonstrate score of 59 or greater on FOTO to show a clinically relevant change to impact self-care activities with greater independence   ? Baseline Score of 55 at eval, 10th visit: FOTO score: 51, increased shoulder  pain limiting overhead activity.  4/5:  score of 64   ? Time 12   ? Period Weeks   ? Status Achieved   ? Target Date 04/25/21   ?  ? OT LONG TERM GOAL #8  ? Title Will assess for potential return to work tasks as patient progresses with therapy   ? Baseline Patient currently unable to perform her job as outlined in her job description   ? Time 12   ? Period Weeks   ? Status On-going   ? Target Date 07/15/21   ?  ? OT LONG TERM GOAL  #9  ? TITLE Pt will improve strength to pick up a case of water to place into her grocery cart   ? Baseline 4/5: unable   ? Time 12   ? Period Weeks   ? Status New   ? Target Date 07/15/21   ?  ? OT LONG TERM GOAL  #10  ? TITLE When on the passenger side, pt will be able to close door with  modified independence   ? Baseline 4/5:  difficulty at times   ? Time 6   ? Period Weeks   ? Status New   ? Target Date 06/06/21   ?  ? OT LONG TERM GOAL  #11  ? TITLE Pt will demonstrate ability to vacu

## 2021-05-04 NOTE — Therapy (Signed)
[place] ?[facility] ?[place], [name], [address] ?Phone: [phone]   Fax:  [phone] ? ?Physical Therapy Treatment ? ?Patient Details  ?Name: Kim Gomez ?MRN: [id number] ?Date of Birth: 1955-04-02 ?Referring Provider (PT): Jennings Books MD ? ? ?Encounter Date: 05/04/2021 ? ? PT End of Session - 05/04/21 0806   ? ? Visit Number 18   ? Number of Visits 24   ? Date for PT Re-Evaluation 05/18/21   ? Progress Note Due on Visit 20   ? PT Start Time [phone]   ? PT Stop Time 0844   ? PT Time Calculation (min) 40 min   ? Equipment Utilized During Treatment Gait belt   ? Activity Tolerance Patient tolerated treatment well   ? Behavior During Therapy [facility] for tasks assessed/performed   ? ?  ?  ? ?  ? ? ?Past Medical History:  ?Diagnosis Date  ? Anxiety   ? Arthritis   ? legs, hands  ? Cellulitis of right knee   ? started on antibiotics 06/21/15.    ? Depression   ? Family history of adverse reaction to anesthesia   ? sister and mom -PONV  ? Hypercholesteremia   ? Hypertension   ? ? ?Past Surgical History:  ?Procedure Laterality Date  ? ABDOMINAL HYSTERECTOMY    ? COLONOSCOPY WITH PROPOFOL N/A 06/27/2015  ? Procedure: COLONOSCOPY WITH PROPOFOL;  Surgeon: Lucilla Lame, MD;  Location: [place];  Service: Endoscopy;  Laterality: N/A;  ? POLYPECTOMY  06/27/2015  ? Procedure: POLYPECTOMY;  Surgeon: Lucilla Lame, MD;  Location: [place];  Service: Endoscopy;;  ? TOTAL HIP ARTHROPLASTY Right 09/10/2019  ? Procedure: TOTAL HIP ARTHROPLASTY ANTERIOR APPROACH;  Surgeon: Lovell Sheehan, MD;  Location: [facility];  Service: Orthopedics;  Laterality: Right;  ? ? ?There were no vitals filed for this visit. ? ? Subjective Assessment - 05/04/21 0805   ? ? Subjective Pt reports no shoulder pain at this time. [place] having difficulty with maintaining shoulder flexion for functional activities. Minimal soreness in the shoulder following the previous session.   ? Patient  is accompained by: Family member   ? 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 Pt reports a fall on october 29th. Pt rpeorts she hurt the R UE with the fall and she reports some stiffness in the knee as well as general weakness on the right side. Pt reports she has had 2 strokes but the first went under the radar and was only evidenced by imaging presented to her by her MD. Pt reports she has been modifying her activities in order to be careful not to fall since the stroke. Reports she has been taking her time with a lot of things. Pt reports she has a membership at [employer] and she has been walking on the treadmill 2-3 times per week, she reports she walks on the treadmill for an hour at a time following working her way up. Pt reports ocassional numbness and tingling in her hands.   ? Limitations House hold activities   ? How long can you walk comfortably? 1 hour with UE support   ? Patient Stated Goals Improve her shoulder pain and function on the right side   ? Currently in Pain? No/denies   ? Pain Score 0-No pain   ? Pain Location Shoulder   ? Pain Orientation Right   ?  Pain Descriptors / Indicators Tender   ? Pain Type Chronic pain   ? Pain Onset More than a month ago   ? ?  ?  ? ?  ? ? ?INTERVENTIONS ?  ?Manual: ?pt supine on plinth as PT provides the following interventions- ?PROM R shoulder: flexion, abduction, x multiple reps of each with UE held at end range.  ?ERE and IRWFL and holds in this position not necessary this date  ?AP and PA GH grade 2-3 joint mobilizations x 1-2 min  ?[place] distraction x 10 sec holds, gentle ?PT provides STM and TrP release, R posterior shoulder, R pec minor musculature x several minutes   ?  ? ?Therex: ? ?Pt transitions to L SL, towel roll added under R arm ?2 x 10 shoulder ER with 1 #  ?-cues for ROM  ?-external cue for correct ,motion  ?-second set pt assisted (25%)  with concentric portion to improve ROM and pt lowered shoulder eccentrically ? ?Serratus punch with 3# with shoulder flexion AROM from 90 to 120 holding serratus activation ?X 10 ?Cues to maintain target shoulder muscle activation and prevent elbow extensors as substitute ? ?PVC exercise: R shoulder flexion 10x through available ROM. In supine with 5 sec holds ?R shoulder abduction 5x  with eccentric lowering from end range abduction ( letting go of PVC at top of range)  ? ?B shoulder HABD with YTB x 10  ?-cues for " shoulder blade squeze" to improve activation of target musculature  ( middle traps)  ? ?  ?Pt educated throughout session about proper posture and technique with exercises. Improved exercise technique, movement at target joints, use of target muscles after min to mod verbal, visual, tactile cues. ? ? ?Note: Portions of this document were prepared using Dragon voice recognition software and although reviewed may contain unintentional dictation errors in syntax, grammar, or spelling. ? ? ? ? ? ? ? ? ? ? ? ? ? ? ? ? ? ? ? ? ? ? ? ? ? ? ? ? ? PT Education - 05/04/21 0933   ? ? Education Details Exercise form and technique   ? Person(s) Educated Patient   ? Methods Explanation   ? Comprehension Verbalized understanding   ? ?  ?  ? ?  ? ? ? PT Short Term Goals - 03/23/21 1656   ? ?  ? PT SHORT TERM GOAL #1  ? Title Patient will be independent in home exercise program to improve strength/mobility for better functional independence with ADLs.   ? Baseline no HEP at this time   ? Time 4   ? Period Weeks   ? Status Achieved   ? Target Date 02/17/21   ?  ? PT SHORT TERM GOAL #2  ? Title Patient will be independent in progressive shoulder home exercise program in order to improve her shoulder strength and function   ? Baseline Patient has initial home exercise program and is comfortable with that but it is yet to be progressed for higher level activities   ? Time 4   ? Period Weeks   ? Status New   ? Target Date  04/20/21   ? ?  ?  ? ?  ? ? ? ? PT Long Term Goals - 03/23/21 1657   ? ?  ? PT LONG TERM GOAL #1  ? Title Patient will increase FOTO score to equal to or greater than  50   to demonstrate statistically significant improvement in  mobility and quality of life.   ? Baseline 41 on 01/20/21   ? Time 8   ? Period Weeks   ? Status Achieved   ? Target Date 03/17/21   ?  ? PT LONG TERM GOAL #2  ? Title Pt will improve mini BEST test by 4 points or more in order to indicate clinically significant improvent in balance.   ? Baseline see flowsheets 27 on 2/27   ? Time 12   ? Period Weeks   ? Status Achieved   ? Target Date 04/14/21   ?  ? PT LONG TERM GOAL #3  ? Title Patient  will complete five times sit to stand test in < 15 seconds indicating an increased LE strength and improved balance.   ? Baseline 13.9 sec on 2/27   ? Time 12   ? Period Weeks   ? Status Achieved   ? Target Date 04/14/21   ?  ? PT LONG TERM GOAL #4  ? Title Patient will increase 10 meter walk test to >1.24ms as to improve gait speed for better community ambulation and to reduce fall risk.   ? Baseline .86 m/s on 01/20/21 2/27: 1.27 m/s   ? Time 8   ? Period Weeks   ? Status Achieved   ? Target Date 03/17/21   ?  ? PT LONG TERM GOAL #5  ? Title Pty will improve dual task TUG to less than 20 seconds in order to indicate improved cognitive and motor tasks.   ? Baseline 28.82 on 01/20/21 14.98 sec on 2/27   ? Time 12   ? Period Weeks   ? Status Achieved   ? Target Date 04/14/21   ?  ? Additional Long Term Goals  ? Additional Long Term Goals Yes   ?  ? PT LONG TERM GOAL #6  ? Title Patient will improve right shoulder by 1 increment on manual muscle test grading scale in order to indicate improved right upper extremity strength.   ? Baseline 3-/5 her right shoulder flexion and abduction(due to range of motion restriction) 3+ with external rotation at 0 degrees abduction   ? Time 8   ? Period Weeks   ? Status New   ? Target Date 05/18/21   ?  ? PT LONG TERM GOAL #7  ?  Title Patient will improve QuickDASH score by 12.5 points in order to indicate significantly improved subjective rating of right shoulder function   ? Baseline 50% on 03/23/2021   ? Time 8   ? Period Weeks

## 2021-05-05 ENCOUNTER — Ambulatory Visit: Payer: No Typology Code available for payment source | Admitting: Speech Pathology

## 2021-05-05 DIAGNOSIS — M6281 Muscle weakness (generalized): Secondary | ICD-10-CM | POA: Diagnosis not present

## 2021-05-05 DIAGNOSIS — R41841 Cognitive communication deficit: Secondary | ICD-10-CM

## 2021-05-05 NOTE — Therapy (Signed)
[place] ?[facility] ?[place], [name], [address] ?Phone: [phone]   Fax:  [phone] ? ?Speech Language Pathology Treatment ? ?Patient Details  ?Name: Kim Gomez ?MRN: [id number] ?Date of Birth: 1955-09-11 ?Referring Provider (SLP): Dr. Jennings Books ? ? ?Encounter Date: 05/05/2021 ? ? End of Session - 05/05/21 1607   ? ? Visit Number 22   ? Number of Visits 25   ? Date for SLP Re-Evaluation 06/24/21   ? Authorization - Visit Number 2   ? Progress Note Due on Visit 10   ? SLP Start Time 1100   ? SLP Stop Time  1200   ? SLP Time Calculation (min) 60 min   ? Activity Tolerance Patient tolerated treatment well   ? ?  ?  ? ?  ? ? ?Past Medical History:  ?Diagnosis Date  ? Anxiety   ? Arthritis   ? legs, hands  ? Cellulitis of right knee   ? started on antibiotics 06/21/15.    ? Depression   ? Family history of adverse reaction to anesthesia   ? sister and mom -PONV  ? Hypercholesteremia   ? Hypertension   ? ? ?Past Surgical History:  ?Procedure Laterality Date  ? ABDOMINAL HYSTERECTOMY    ? COLONOSCOPY WITH PROPOFOL N/A 06/27/2015  ? Procedure: COLONOSCOPY WITH PROPOFOL;  Surgeon: Lucilla Lame, MD;  Location: [place];  Service: Endoscopy;  Laterality: N/A;  ? POLYPECTOMY  06/27/2015  ? Procedure: POLYPECTOMY;  Surgeon: Lucilla Lame, MD;  Location: [place];  Service: Endoscopy;;  ? TOTAL HIP ARTHROPLASTY Right 09/10/2019  ? Procedure: TOTAL HIP ARTHROPLASTY ANTERIOR APPROACH;  Surgeon: Lovell Sheehan, MD;  Location: [facility];  Service: Orthopedics;  Laterality: Right;  ? ? ?There were no vitals filed for this visit. ? ? Subjective Assessment - 05/05/21 1321   ? ? Subjective "I can write a reminder for that."   ? Currently in Pain? No/denies   ? Pain Score 0-No pain   ? ?  ?  ? ?  ? ? ? ? ? ? ? ? ADULT SLP TREATMENT - 05/05/21 1327   ? ?  ? General Information  ? Behavior/Cognition Alert;Cooperative;Pleasant mood   ? HPI Patient is a 66  y.o. female who admitted 08/10/20 and found to have Covid-19 as well as left ischemic thalamic/internal capsule infarct. Acute SLP screened and s/o; pt now with residual cognitive fog, imbalance, fatigue, right first + second digit numbness.   ?  ? Cognitive-Linquistic Treatment  ? Treatment focused on Cognition;Patient/family/caregiver education   ? Skilled Treatment Pt independently reviewed calendar to recall events from her weekend. Pt noticed she forgot to fill out her tracker, with mod I pt used calendar to help fill in/update her habit tracker. Pt requested time to write a reminder of instructions for an upcoming appointment. Facilitated navigating phone to access her notes app for alerts and reminders. Pt expressed being open to using notes on her phone when she isn't near her notebook. Reviewed article from previous session (Memory Loss after Stroke). Pt took notes during discussion and with min-mod cues generated a list of attention/memory strategies to help with daily tasks (ie calendar, checklists, taking notes, daily routine).  Educated on using attention strategies to help return to tasks after an interruption or distraction. Targeted alternating attention during balancing checkbook activity. Pt with hesitation initiating task and required min cues for where to start. During task, graduate clinician interrupted  pt 4x by initiating conversation. Pt able to regain focus and return to task 3/4 times with min cues. Pt unaware of 1 error she made while returning to task. Toward end of task, pt using strategies to remember where she left off (checking off completed items).   ?  ? Assessment / Recommendations / Plan  ? Plan Continue with current plan of care   ?  ? Progression Toward Goals  ? Progression toward goals Progressing toward goals   ? ?  ?  ? ?  ? ? ? SLP Education - 05/05/21 1606   ? ? Education Details Attention strategies   ? Person(s) Educated Patient   ? Methods Explanation   ? Comprehension  Verbalized understanding   ? ?  ?  ? ?  ? ? ? SLP Short Term Goals - 03/26/21 1247   ? ?  ? SLP SHORT TERM GOAL #1  ? Title Pt will verbalize and demonstrate how to implement 2 memory and attention strategies to aid daily functioning given occasional min A over 2 sessions   ? Status Achieved   ?  ? SLP SHORT TERM GOAL #2  ? Title Pt will manage finances with use of compensations with no reported episodes of missed bills given rare min A   ? Status Achieved   ?  ? SLP SHORT TERM GOAL #3  ? Title Pt will identify errors in writing and self-correct with 80% accuracy given rare min A over 2 sessions   ? Status Achieved   ?  ? SLP SHORT TERM GOAL #4  ? Title Pt will participate in further language assessment as needed (goals to be added)   ? Status Achieved   ? ?  ?  ? ?  ? ? ? SLP Long Term Goals - 04/22/21 1215   ? ?  ? SLP LONG TERM GOAL #1  ? Title Pt will verbalize and implement 4 attention and memory strategies to aid daily functioning for ADLs and IADLs with rare min A over 2 sessions   ? Time 12   ? Period Weeks   ? Status On-going   ? Target Date 06/24/21   ?  ? SLP LONG TERM GOAL #2  ? Title Pt will identify and correct paraphasias in conversation and writing with 90% accuracy given rare min A over 2 sessions   ? Time 12   ? Period Weeks   ? Status Achieved   ? Target Date 06/24/21   ?  ? SLP LONG TERM GOAL #3  ? Title Pt will use compensations for thought organization effectively in 20 minutes mod complex conversation.   ? Time 12   ? Period Weeks   ? Status On-going   ? Target Date 06/24/21   ?  ? SLP LONG TERM GOAL #4  ? Title Patient will maintain alternating attention between 2 mod complex tasks for 15 minutes with modified independence (strategies allowed).   ? Time 12   ? Period Weeks   ? Status On-going   ? Target Date 06/24/21   ?  ? SLP LONG TERM GOAL #5  ? Title Patient will use strategies and executive function aid to preplan and manage IADLs independently.   ? Time 12   ? Period Weeks   ? Status  On-going   ? Target Date 06/24/21   ? ?  ?  ? ?  ? ? ? Plan - 05/05/21 1608   ? ? Clinical Impression Statement  Patient presents with mild cognitive communication impairment. Pt gradually recognizing opportunities to use memory/attention strategies; noted pt independently writing down reminders and checking things off her grocery list. Pt continues to be receptive to using memory/attention strategies. Pt with less difficulty alternating between tasks and maintaining focus today, but benefited from cues for error awareness. Continue to encourage use of trained strategies to increase independence and aid in recall.  Recommend skilled ST to maximize pt's cognitive communication and language skills to improve accuracy/independence with ADLs and IADLs, reduce communication breakdowns, and improve quality of life. Recommend skilled ST to maximize pt's cognitive communication and language skills to improve accuracy/independence with ADLs and IADLs, reduce communication breakdowns, and improve quality of life.   ? Speech Therapy Frequency 2x / week   ? Duration 12 weeks   ? Treatment/Interventions Language facilitation;Environmental controls;SLP instruction and feedback;Cognitive reorganization;Compensatory strategies;Patient/family education;Internal/external aids   ? Potential to Achieve Goals Good   ? SLP Home Exercise Plan see pt instructions   ? Consulted and Agree with Plan of Care Patient   ? ?  ?  ? ?  ? ? ?Patient will benefit from skilled therapeutic intervention in order to improve the following deficits and impairments:   ?Cognitive communication deficit ? ? ? ?Problem List ?Patient Active Problem List  ? Diagnosis Date Noted  ? Acute stroke due to ischemia ([address]) 08/10/2020  ? 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 Special screening for malignant neoplasms, colon   ? Benign neoplasm of ascending colon   ? Benign neoplasm of descending colon   ? ?Caryl Comes Smith-Sneed SLP Student   ? ?[place]  Smith-Sneed, [place] ?05/05/2021, 4:28 PM ? ?[place] ?[facility] ?[place], [name], [address] ?Phone: [phone]   Fax:  [phone] ? ? ?Name: Me

## 2021-05-06 ENCOUNTER — Ambulatory Visit: Payer: No Typology Code available for payment source | Admitting: Occupational Therapy

## 2021-05-06 ENCOUNTER — Ambulatory Visit: Payer: No Typology Code available for payment source | Admitting: Physical Therapy

## 2021-05-07 ENCOUNTER — Ambulatory Visit: Payer: No Typology Code available for payment source | Admitting: Speech Pathology

## 2021-05-07 DIAGNOSIS — R41841 Cognitive communication deficit: Secondary | ICD-10-CM

## 2021-05-07 DIAGNOSIS — M6281 Muscle weakness (generalized): Secondary | ICD-10-CM | POA: Diagnosis not present

## 2021-05-07 NOTE — Therapy (Signed)
[place] ?[facility] ?[place], [name], [address] ?Phone: [phone]   Fax:  [phone] ? ?Speech Language Pathology Treatment ? ?Patient Details  ?Name: Kim Gomez ?MRN: [id number] ?Date of Birth: 1955-03-27 ?Referring Provider (SLP): Dr. Jennings Books ? ? ?Encounter Date: 05/07/2021 ? ? End of Session - 05/07/21 1040   ? ? Visit Number 23   ? Number of Visits 25   ? Date for SLP Re-Evaluation 06/24/21   ? Authorization - Visit Number 3   ? Progress Note Due on Visit 10   ? SLP Start Time 1000   ? SLP Stop Time  1057  ? SLP Time Calculation (min) 60 min   ? Activity Tolerance Patient tolerated treatment well   ? ?  ?  ? ?  ? ? ?Past Medical History:  ?Diagnosis Date  ? Anxiety   ? Arthritis   ? legs, hands  ? Cellulitis of right knee   ? started on antibiotics 06/21/15.    ? Depression   ? Family history of adverse reaction to anesthesia   ? sister and mom -PONV  ? Hypercholesteremia   ? Hypertension   ? ? ?Past Surgical History:  ?Procedure Laterality Date  ? ABDOMINAL HYSTERECTOMY    ? COLONOSCOPY WITH PROPOFOL N/A 06/27/2015  ? Procedure: COLONOSCOPY WITH PROPOFOL;  Surgeon: Lucilla Lame, MD;  Location: [place];  Service: Endoscopy;  Laterality: N/A;  ? POLYPECTOMY  06/27/2015  ? Procedure: POLYPECTOMY;  Surgeon: Lucilla Lame, MD;  Location: [place];  Service: Endoscopy;;  ? TOTAL HIP ARTHROPLASTY Right 09/10/2019  ? Procedure: TOTAL HIP ARTHROPLASTY ANTERIOR APPROACH;  Surgeon: Lovell Sheehan, MD;  Location: [facility];  Service: Orthopedics;  Laterality: Right;  ? ? ?There were no vitals filed for this visit. ? ? Subjective Assessment - 05/07/21 1031   ? ? Subjective Pt got out her meal plan   ? Currently in Pain? No/denies   ? ?  ?  ? ?  ? ? ? ? ? ? ? ? ADULT SLP TREATMENT - 05/07/21 1032   ? ?  ? General Information  ? Behavior/Cognition Alert;Cooperative;Pleasant mood   ? HPI Patient is a 66 y.o. female who admitted 08/10/20 and  found to have Covid-19 as well as left ischemic thalamic/internal capsule infarct. Acute SLP screened and s/o; pt now with residual cognitive fog, imbalance, fatigue, right first + second digit numbness.   ?  ? Cognitive-Linquistic Treatment  ? Treatment focused on Cognition;Patient/family/caregiver education   ? Skilled Treatment Pt retrieved calendar and grocery list. Pt planned meal, with min question cue added her grocery day and cooking day to the calendar. Has not planned a day to cook next week due to upcoming cataract surgery on Monday. Pt able to access notes app; reports she did not need to use it to record any details yesterday due to being by her notebook during the day. When SLP asked about habit tracker, pt realized she had not recorded yesterday's activities and then did so with min cues. Pt states she forgets this, but was able to strategize a way she might remember (leaving it out/available). SLP suggested pt keep tracker folded in her planner so that she can mark off tasks when completed, and pt agreed this would be helpful. Targeted alternating attention between two functional tasks (calculating early/late appointment arrival and calculating cash remaining). Pt alternated attention for 15 minutes, accuracy 100% and 78%, respectively (pt double-checked  responses with initial instruction to make sure she reviews for accuracy). Targeted verbal organization in mod complex-complex conversation re: her progress in therapy and what she has learned so far. Pt took notes on her homework task for next session to prepare for conversation with graduate student on her progress in therapy.   ?  ? Assessment / Recommendations / Plan  ? Plan Continue with current plan of care   ?  ? Progression Toward Goals  ? Progression toward goals Progressing toward goals   ? ?  ?  ? ?  ? ? ? ? ? SLP Short Term Goals - 03/26/21 1247   ? ?  ? SLP SHORT TERM GOAL #1  ? Title Pt will verbalize and demonstrate how to implement 2  memory and attention strategies to aid daily functioning given occasional min A over 2 sessions   ? Status Achieved   ?  ? SLP SHORT TERM GOAL #2  ? Title Pt will manage finances with use of compensations with no reported episodes of missed bills given rare min A   ? Status Achieved   ?  ? SLP SHORT TERM GOAL #3  ? Title Pt will identify errors in writing and self-correct with 80% accuracy given rare min A over 2 sessions   ? Status Achieved   ?  ? SLP SHORT TERM GOAL #4  ? Title Pt will participate in further language assessment as needed (goals to be added)   ? Status Achieved   ? ?  ?  ? ?  ? ? ? SLP Long Term Goals - 04/22/21 1215   ? ?  ? SLP LONG TERM GOAL #1  ? Title Pt will verbalize and implement 4 attention and memory strategies to aid daily functioning for ADLs and IADLs with rare min A over 2 sessions   ? Time 12   ? Period Weeks   ? Status On-going   ? Target Date 06/24/21   ?  ? SLP LONG TERM GOAL #2  ? Title Pt will identify and correct paraphasias in conversation and writing with 90% accuracy given rare min A over 2 sessions   ? Time 12   ? Period Weeks   ? Status Achieved   ? Target Date 06/24/21   ?  ? SLP LONG TERM GOAL #3  ? Title Pt will use compensations for thought organization effectively in 20 minutes mod complex conversation.   ? Time 12   ? Period Weeks   ? Status On-going   ? Target Date 06/24/21   ?  ? SLP LONG TERM GOAL #4  ? Title Patient will maintain alternating attention between 2 mod complex tasks for 15 minutes with modified independence (strategies allowed).   ? Time 12   ? Period Weeks   ? Status On-going   ? Target Date 06/24/21   ?  ? SLP LONG TERM GOAL #5  ? Title Patient will use strategies and executive function aid to preplan and manage IADLs independently.   ? Time 12   ? Period Weeks   ? Status On-going   ? Target Date 06/24/21   ? ?  ?  ? ?  ? ? ? Plan - 05/07/21 1040   ? ? Clinical Impression Statement Patient presents with mild cognitive communication impairment. Pt  gradually recognizing opportunities and initiating use of memory/attention strategies. Pt reported asking her spouse for a moment to finish a computer task before focusing on the question he  was asking her. Pt maintained alternating attention for 15 minutes in mod complex tasks, with modified independence for double checking, use of attention strategies. During mod complex-complex conversation, pt's hesitation and fluency appear significantly improved over the past few weeks. Continue skilled ST to maximize pt's cognitive communication and language skills to improve accuracy/independence with ADLs and IADLs, reduce communication breakdowns, and improve quality of life.   ? Speech Therapy Frequency 2x / week   ? Duration 12 weeks   ? Treatment/Interventions Language facilitation;Environmental controls;SLP instruction and feedback;Cognitive reorganization;Compensatory strategies;Patient/family education;Internal/external aids   ? Potential to Achieve Goals Good   ? SLP Home Exercise Plan see pt instructions   ? Consulted and Agree with Plan of Care Patient   ? ?  ?  ? ?  ? ? ?Patient will benefit from skilled therapeutic intervention in order to improve the following deficits and impairments:   ?Cognitive communication deficit ? ? ? ?Problem List ?Patient Active Problem List  ? Diagnosis Date Noted  ? Acute stroke due to ischemia ([place]) 08/10/2020  ? 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 Special screening for malignant neoplasms, colon   ? Benign neoplasm of ascending colon   ? Benign neoplasm of descending colon   ? ?Deneise Lever, MS, CCC-SLP ?Speech-Language Pathologist ?[phone] ? ? ?Aliene Altes, CCC-SLP ?05/07/2021, 10:59 AM ? ?[place] [facility] ?[place], [name], [address] ?Phone: [phone]   Fax:  [phone] ? ? ?Name: BERTINA GUTHRIDGE ?MRN: [id number] ?Date of Birth: 10-08-1955 ? ?

## 2021-05-12 ENCOUNTER — Ambulatory Visit: Payer: No Typology Code available for payment source | Admitting: Speech Pathology

## 2021-05-12 ENCOUNTER — Ambulatory Visit: Payer: No Typology Code available for payment source | Admitting: Physical Therapy

## 2021-05-14 ENCOUNTER — Ambulatory Visit: Payer: No Typology Code available for payment source | Admitting: Occupational Therapy

## 2021-05-14 ENCOUNTER — Ambulatory Visit: Payer: No Typology Code available for payment source | Admitting: Speech Pathology

## 2021-05-14 ENCOUNTER — Ambulatory Visit: Payer: No Typology Code available for payment source | Admitting: Physical Therapy

## 2021-05-18 ENCOUNTER — Ambulatory Visit: Payer: No Typology Code available for payment source

## 2021-05-18 ENCOUNTER — Ambulatory Visit: Payer: No Typology Code available for payment source | Attending: Neurology | Admitting: Speech Pathology

## 2021-05-18 DIAGNOSIS — M6281 Muscle weakness (generalized): Secondary | ICD-10-CM | POA: Insufficient documentation

## 2021-05-18 DIAGNOSIS — G8929 Other chronic pain: Secondary | ICD-10-CM | POA: Diagnosis present

## 2021-05-18 DIAGNOSIS — R278 Other lack of coordination: Secondary | ICD-10-CM | POA: Insufficient documentation

## 2021-05-18 DIAGNOSIS — R41841 Cognitive communication deficit: Secondary | ICD-10-CM | POA: Diagnosis not present

## 2021-05-18 DIAGNOSIS — R4701 Aphasia: Secondary | ICD-10-CM | POA: Diagnosis present

## 2021-05-18 DIAGNOSIS — M25611 Stiffness of right shoulder, not elsewhere classified: Secondary | ICD-10-CM | POA: Insufficient documentation

## 2021-05-18 DIAGNOSIS — M25511 Pain in right shoulder: Secondary | ICD-10-CM | POA: Diagnosis present

## 2021-05-18 NOTE — Therapy (Signed)
[place] [facility] ?[place], [name], [address] ?Phone: [phone]   Fax:  [phone] ? ?Speech Language Pathology Treatment ? ?Patient Details  ?Name: Kim Gomez ?MRN: [id number] ?Date of Birth: 11-30-1955 ?Referring Provider (SLP): Dr. Jennings Books ? ? ?Encounter Date: 05/18/2021 ? ? End of Session - 05/18/21 1058   ? ? Visit Number 24   ? Number of Visits 25   ? Date for SLP Re-Evaluation 06/24/21   ? Authorization - Visit Number 4   ? Progress Note Due on Visit 10   ? SLP Start Time [phone]   ? SLP Stop Time  1055   ? SLP Time Calculation (min) 60 min   ? Activity Tolerance Patient tolerated treatment well   ? ?  ?  ? ?  ? ? ?Past Medical History:  ?Diagnosis Date  ? Anxiety   ? Arthritis   ? legs, hands  ? Cellulitis of right knee   ? started on antibiotics 06/21/15.    ? Depression   ? Family history of adverse reaction to anesthesia   ? sister and mom -PONV  ? Hypercholesteremia   ? Hypertension   ? ? ?Past Surgical History:  ?Procedure Laterality Date  ? ABDOMINAL HYSTERECTOMY    ? COLONOSCOPY WITH PROPOFOL N/A 06/27/2015  ? Procedure: COLONOSCOPY WITH PROPOFOL;  Surgeon: Lucilla Lame, MD;  Location: [facility];  Service: Endoscopy;  Laterality: N/A;  ? POLYPECTOMY  06/27/2015  ? Procedure: POLYPECTOMY;  Surgeon: Lucilla Lame, MD;  Location: [facility];  Service: Endoscopy;;  ? TOTAL HIP ARTHROPLASTY Right 09/10/2019  ? Procedure: TOTAL HIP ARTHROPLASTY ANTERIOR APPROACH;  Surgeon: Lovell Sheehan, MD;  Location: [facility];  Service: Orthopedics;  Laterality: Right;  ? ? ?There were no vitals filed for this visit. ? ? ? ? ? ? ? ? ? ADULT SLP TREATMENT - 05/18/21 1348   ? ?  ? General Information  ? Behavior/Cognition Alert;Cooperative;Pleasant mood   ? HPI Patient is a 66 y.o. female who admitted 08/10/20 and found to have Covid-19 as well as left ischemic thalamic/internal capsule infarct. Acute SLP screened and s/o; pt now with  residual cognitive fog, imbalance, fatigue, right first + second digit numbness.   ?  ? Cognitive-Linquistic Treatment  ? Treatment focused on Cognition;Patient/family/caregiver education   ? Skilled Treatment Pt retrieved calendar; reports modifying her schedule over the past week due to cataract surgery and recovery taking longer than she expected. Pt required occasional min cues initially to transfer over monthly activities. Pt alternated attention between her planner, therapy schedule, and conversation with SLP, recording upcoming appointment times 100% accuracy. Education to pt regarding goal for pt to become independent with carrying over weekly/monthly planning routines. She is independent with following daily schedule using her planner, however requires min-mod cues for planning/anticipating recurring events. Pt states she is not using her habit tracker; discussed with pt and she is in agreement to use her planner to routinely schedule/log activities vs the tracker. Pt used notes she had written to facilitate discussion of her progress in therapy and areas for future focus. Pt stated she wants to continue increasing amount of daily tasks and improve her ability to verbalize her thoughts. Pt agreed that pre-planning for this conversation today was helpful, and having written summary assisted with conveying her thoughts.   ?  ? Assessment / Recommendations / Plan  ? Plan Continue with current plan of care   ?  ?  Progression Toward Goals  ? Progression toward goals Progressing toward goals   ? ?  ?  ? ?  ? ? ? SLP Education - 05/18/21 1403   ? ? Education Details preplanning for conversations   ? Person(s) Educated Patient   ? Methods Explanation   ? Comprehension Verbalized understanding   ? ?  ?  ? ?  ? ? ? SLP Short Term Goals - 03/26/21 1247   ? ?  ? SLP SHORT TERM GOAL #1  ? Title Pt will verbalize and demonstrate how to implement 2 memory and attention strategies to aid daily functioning given occasional  min A over 2 sessions   ? Status Achieved   ?  ? SLP SHORT TERM GOAL #2  ? Title Pt will manage finances with use of compensations with no reported episodes of missed bills given rare min A   ? Status Achieved   ?  ? SLP SHORT TERM GOAL #3  ? Title Pt will identify errors in writing and self-correct with 80% accuracy given rare min A over 2 sessions   ? Status Achieved   ?  ? SLP SHORT TERM GOAL #4  ? Title Pt will participate in further language assessment as needed (goals to be added)   ? Status Achieved   ? ?  ?  ? ?  ? ? ? SLP Long Term Goals - 04/22/21 1215   ? ?  ? SLP LONG TERM GOAL #1  ? Title Pt will verbalize and implement 4 attention and memory strategies to aid daily functioning for ADLs and IADLs with rare min A over 2 sessions   ? Time 12   ? Period Weeks   ? Status On-going   ? Target Date 06/24/21   ?  ? SLP LONG TERM GOAL #2  ? Title Pt will identify and correct paraphasias in conversation and writing with 90% accuracy given rare min A over 2 sessions   ? Time 12   ? Period Weeks   ? Status Achieved   ? Target Date 06/24/21   ?  ? SLP LONG TERM GOAL #3  ? Title Pt will use compensations for thought organization effectively in 20 minutes mod complex conversation.   ? Time 12   ? Period Weeks   ? Status On-going   ? Target Date 06/24/21   ?  ? SLP LONG TERM GOAL #4  ? Title Patient will maintain alternating attention between 2 mod complex tasks for 15 minutes with modified independence (strategies allowed).   ? Time 12   ? Period Weeks   ? Status On-going   ? Target Date 06/24/21   ?  ? SLP LONG TERM GOAL #5  ? Title Patient will use strategies and executive function aid to preplan and manage IADLs independently.   ? Time 12   ? Period Weeks   ? Status On-going   ? Target Date 06/24/21   ? ?  ?  ? ?  ? ? ? Plan - 05/18/21 1403   ? ? Clinical Impression Statement Patient presents with mild cognitive communication impairment. Pt gradually recognizing opportunities and initiating use of memory/attention  strategies. Pt maintained alternating attention for 15 minutes in mod complex tasks,100% accuracy with transferring information. During mod complex-complex conversation, pt utilized preplanned notes to facilitate verbal expression effectively. Continue skilled ST to maximize pt's cognitive communication and language skills to improve accuracy/independence with ADLs and IADLs, reduce communication breakdowns, and improve quality of  life.   ? Speech Therapy Frequency 2x / week   ? Duration 12 weeks   ? Treatment/Interventions Language facilitation;Environmental controls;SLP instruction and feedback;Cognitive reorganization;Compensatory strategies;Patient/family education;Internal/external aids   ? Potential to Achieve Goals Good   ? SLP Home Exercise Plan see pt instructions   ? Consulted and Agree with Plan of Care Patient   ? ?  ?  ? ?  ? ? ?Patient will benefit from skilled therapeutic intervention in order to improve the following deficits and impairments:   ?Cognitive communication deficit ? ? ? ?Problem List ?Patient Active Problem List  ? Diagnosis Date Noted  ? Acute stroke due to ischemia ([facility]) 08/10/2020  ? 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 Special screening for malignant neoplasms, colon   ? Benign neoplasm of ascending colon   ? Benign neoplasm of descending colon   ? ?Deneise Lever, MS, CCC-SLP ?Speech-Language Pathologist ?[phone] ? ?Aliene Altes, CCC-SLP ?05/18/2021, 2:05 PM ? ?[facility] ?[place], [name], [address] ?Phone: [phone]   Fax:  [phone] ? ? ?Name: Kim Gomez ?MRN: [id number] ?Date of Birth: April 11, 1955 ? ?

## 2021-05-18 NOTE — Therapy (Signed)
[facility] ?[place], [name], [address] ?Phone: [phone]   Fax:  [phone] ? ?Physical Therapy Treatment/Recertification for dates 05/18/2021-08/10/2021 ? ?Patient Details  ?Name: Kim Gomez ?MRN: [id number] ?Date of Birth: 05-Feb-1955 ?Referring Provider (PT): Jennings Books MD ? ? ?Encounter Date: 05/18/2021 ? ? PT End of Session - 05/18/21 2108   ? ? Visit Number 19   ? Number of Visits 31   ? Date for PT Re-Evaluation 08/10/21   ? Authorization Time Period Recert 05/20/8411-2/44/0102   ? Progress Note Due on Visit 20   ? PT Start Time 0801   ? PT Stop Time 0841   ? PT Time Calculation (min) 40 min   ? Activity Tolerance Patient tolerated treatment well   ? Behavior During Therapy [facility] for tasks assessed/performed   ? ?  ?  ? ?  ? ? ?Past Medical History:  ?Diagnosis Date  ? Anxiety   ? Arthritis   ? legs, hands  ? Cellulitis of right knee   ? started on antibiotics 06/21/15.    ? Depression   ? Family history of adverse reaction to anesthesia   ? sister and mom -PONV  ? Hypercholesteremia   ? Hypertension   ? ? ?Past Surgical History:  ?Procedure Laterality Date  ? ABDOMINAL HYSTERECTOMY    ? COLONOSCOPY WITH PROPOFOL N/A 06/27/2015  ? Procedure: COLONOSCOPY WITH PROPOFOL;  Surgeon: Lucilla Lame, MD;  Location: [facility];  Service: Endoscopy;  Laterality: N/A;  ? POLYPECTOMY  06/27/2015  ? Procedure: POLYPECTOMY;  Surgeon: Lucilla Lame, MD;  Location: [facility];  Service: Endoscopy;;  ? TOTAL HIP ARTHROPLASTY Right 09/10/2019  ? Procedure: TOTAL HIP ARTHROPLASTY ANTERIOR APPROACH;  Surgeon: Lovell Sheehan, MD;  Location: [facility];  Service: Orthopedics;  Laterality: Right;  ? ? ?There were no vitals filed for this visit. ? ? Subjective Assessment - 05/18/21 0810   ? ? Subjective Pt reports she is feeling well and doing more for herself as far as using her arm. Reports would still like to be able to raise her arm better to reach  overhead more successfully.   ? Patient is accompained by: Family member   ? 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 Pt reports a fall on october 29th. Pt rpeorts she hurt the R UE with the fall and she reports some stiffness in the knee as well as general weakness on the right side. Pt reports she has had 2 strokes but the first went under the radar and was only evidenced by imaging presented to her by her MD. Pt reports she has been modifying her activities in order to be careful not to fall since the stroke. Reports she has been taking her time with a lot of things. Pt reports she has a membership at [employer] and she has been walking on the treadmill 2-3 times per week, she reports she walks on the treadmill for an hour at a time following working her way up. Pt reports ocassional numbness and tingling in her hands.   ? Limitations House hold activities   ? How long can you walk comfortably? 1 hour with UE support   ? Patient Stated Goals Improve her shoulder pain and function on the right side   ? Currently in Pain? No/denies   ? Pain Onset --   ? ?  ?  ? ?  ? ? ?  Reassess all remaining goals:  ? ? ? ?MMT Right shoulder:  ?-4/5 in available ROM with right Shoulder flex/ABD/ER ? ? ?Quickdash: 23 (GOAL MET)  ? ?R Shoulder AROM: Flex= 158 deg; ABD= 152; ER= 44 ? ?R Shoulder PROM: Flex= 164 deg; ABD= 160; ER= 52 ? ? ? ?Reviewed Wand exercises- Shoulder flex/ABD/ER (seated today using PVC pipe x 20 reps each)  ? ? ?Access Code: [email or web address] ?URL: [email or web address] ?Date: 05/18/2021 ?Prepared by: Sande Brothers ? ?Exercises ?- Seated Shoulder External Rotation AAROM with Cane and Hand in Neutral  - 1 x daily - 7 x weekly - 3 sets - 10 reps ?- Seated Shoulder Abduction AAROM with Dowel  - 1 x daily - 7 x weekly - 3 sets - 10 reps ?- Seated Shoulder Flexion AAROM with Dowel  - 1 x daily - 7 x  weekly - 3 sets - 10 reps ? ? ? ? ? ? ? ? ? ? ? ? ? ? PT Education - 05/18/21 2105   ? ? Education Details HEP, PT plan for recert   ? Person(s) Educated Patient   ? Methods Explanation   ? Comprehension Verbalized understanding;Returned demonstration   ? ?  ?  ? ?  ? ? ? PT Short Term Goals - 03/23/21 1656   ? ?  ? PT SHORT TERM GOAL #1  ? Title Patient will be independent in home exercise program to improve strength/mobility for better functional independence with ADLs.   ? Baseline no HEP at this time   ? Time 4   ? Period Weeks   ? Status Achieved   ? Target Date 02/17/21   ?  ? PT SHORT TERM GOAL #2  ? Title Patient will be independent in progressive shoulder home exercise program in order to improve her shoulder strength and function   ? Baseline Patient has initial home exercise program and is comfortable with that but it is yet to be progressed for higher level activities   ? Time 4   ? Period Weeks   ? Status New   ? Target Date 04/20/21   ? ?  ?  ? ?  ? ? ? ? PT Long Term Goals - 05/18/21 0812   ? ?  ? PT LONG TERM GOAL #1  ? Title Patient will increase FOTO score to equal to or greater than  50   to demonstrate statistically significant improvement in mobility and quality of life.   ? Baseline 41 on 01/20/21   ? Time 8   ? Period Weeks   ? Status Achieved   ? Target Date 03/17/21   ?  ? PT LONG TERM GOAL #2  ? Title Pt will improve mini BEST test by 4 points or more in order to indicate clinically significant improvent in balance.   ? Baseline see flowsheets 27 on 2/27   ? Time 12   ? Period Weeks   ? Status Achieved   ? Target Date 04/14/21   ?  ? PT LONG TERM GOAL #3  ? Title Patient  will complete five times sit to stand test in < 15 seconds indicating an increased LE strength and improved balance.   ? Baseline 13.9 sec on 2/27   ? Time 12   ? Period Weeks   ? Status Achieved   ? Target Date 04/14/21   ?  ? PT LONG TERM GOAL #4  ? Title Patient will increase 10 meter walk test to >1.9ms  as to improve  gait speed for better community ambulation and to reduce fall risk.   ? Baseline .86 m/s on 01/20/21 2/27: 1.27 m/s   ? Time 8   ? Period Weeks   ? Status Achieved   ? Target Date 03/17/21   ?  ? PT LONG TERM GOAL #5  ? Title Pty will improve dual task TUG to less than 20 seconds in order to indicate improved cognitive and motor tasks.   ? Baseline 28.82 on 01/20/21 14.98 sec on 2/27   ? Time 12   ? Period Weeks   ? Status Achieved   ? Target Date 04/14/21   ?  ? Additional Long Term Goals  ? Additional Long Term Goals Yes   ?  ? PT LONG TERM GOAL #6  ? Title Patient will improve right shoulder by 1 increment on manual muscle test grading scale in order to indicate improved right upper extremity strength.   ? Baseline 3-/5 her right shoulder flexion and abduction(due to range of motion restriction) 3+ with external rotation at 0 degrees abduction; 05/18/2021=Access Code: [email or web address]  URL: [email or web address]   ? Time 12   ? Period Weeks   ? Status Partially Met   ? Target Date 08/10/21   ?  ? PT LONG TERM GOAL #7  ? Title Patient will improve QuickDASH score by 12.5 points in order to indicate significantly improved subjective rating of right shoulder function   ? Baseline 50% on 03/23/2021; 05/18/2021= 23%   ? Time 8   ? Period Weeks   ? Status Achieved   ? Target Date 05/18/21   ?  ? PT LONG TERM GOAL #8  ? Title Patient will improve right shoulder active range of motion by 30 degrees with elevation and by 15 degrees with external rotation in order to indicate improved shoulder function for overhead activities and for self-care activities   ? Baseline 90 degrees flexion range of motion , 85 degrees abduction range of motion, 25 degrees external rotation range of motion ( all AROM); 05/18/2021=R Shoulder AROM: Flex= 158 deg; ABD= 152; ER= 44   ? Time 8   ? Period Weeks   ? Status Achieved   ? Target Date 05/18/21   ?  ? PT LONG TERM GOAL  #9  ? TITLE Patient will improve right shoulder passive range of motion by  30 degrees of elevation by 10 degrees with external rotation in order to indicate improved right shoulder range of motion for progressive functional activities   ? Baseline 120 degrees flexion, 123 degrees

## 2021-05-18 NOTE — Therapy (Signed)
[facility] ?[place], [name], [address] ?Phone: [phone]   Fax:  [phone] ? ?Occupational Therapy Treatment ? ?Patient Details  ?Name: Kim Gomez ?MRN: [id number] ?Date of Birth: [birth year] ?Referring Provider (OT): Manuella Ghazi, H ? ? ?Encounter Date: 05/18/2021 ? ? OT End of Session - 05/18/21 0907   ? ? Visit Number 19   ? Number of Visits 48   ? Date for OT Re-Evaluation 07/15/21   ? OT Start Time 0900   ? OT Stop Time 1017   ? OT Time Calculation (min) 45 min   ? Activity Tolerance Patient tolerated treatment well   ? Behavior During Therapy [facility] for tasks assessed/performed   ? ?  ?  ? ?  ? ? ?Past Medical History:  ?Diagnosis Date  ? Anxiety   ? Arthritis   ? legs, hands  ? Cellulitis of right knee   ? started on antibiotics 06/21/15.    ? Depression   ? Family history of adverse reaction to anesthesia   ? sister and mom -PONV  ? Hypercholesteremia   ? Hypertension   ? ? ?Past Surgical History:  ?Procedure Laterality Date  ? ABDOMINAL HYSTERECTOMY    ? COLONOSCOPY WITH PROPOFOL N/A 06/27/2015  ? Procedure: COLONOSCOPY WITH PROPOFOL;  Surgeon: Lucilla Lame, MD;  Location: [facility];  Service: Endoscopy;  Laterality: N/A;  ? POLYPECTOMY  06/27/2015  ? Procedure: POLYPECTOMY;  Surgeon: Lucilla Lame, MD;  Location: [facility];  Service: Endoscopy;;  ? TOTAL HIP ARTHROPLASTY Right 09/10/2019  ? Procedure: TOTAL HIP ARTHROPLASTY ANTERIOR APPROACH;  Surgeon: Lovell Sheehan, MD;  Location: [facility];  Service: Orthopedics;  Laterality: Right;  ? ? ?There were no vitals filed for this visit. ? ? Subjective Assessment - 05/18/21 0903   ? ? Subjective  Pt reports difficulty getting bills and change from her wallet.   ? Pertinent History Kim Gomez is a 66 y.o. female with PMH significant for HTN, HLD, anxiety disorder, recently hospitalized for COVID-19 infection (08/06/2020) at an outside facility. Patient was brought to the ED on  7/24 after she had a sudden onset of numbness involving right side of her body without any motor deficits, dysarthria, dysphagia. 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 Per chart for her f/u neuro visit:Left ischemic thalamic/internal capsule infarct presenting with right-sided numbness, weakness - now with residual cognitive fog (improving), imbalance (improving), fatigue, right first + second digit numbness, in patient with known vascular risk factors of hypertension, dyslipidemia, lack of physical activity, snoring   ? Patient Stated Goals Pt reports she wants to get back to doing things more independently like she was before COVID and CVA.   ? Currently in Pain? No/denies   ? Pain Score 0-No pain   ? Pain Onset More than a month ago   ? ?  ?  ? ?  ? ?Occupational Therapy Treatment: ?Neuro re-ed: ?Pt worked with [place] stones in R hand, picking up 1 at a time and a palm full, storing, and discarding 1 at a time from palm of hand. ? ?Self Care: ?Pt sorted stack of bills and worked with a bag of coins to remove only the pennies inside the bag, storing into palm then discarding onto table.  Pt practiced picking up 5 coins from the table, placing into shirt pocket (near waist), removing all, placing into zipper pants pocket, removing all.   ? ?  Therapeutic Exercise: ?Facilitated pinch strengthening and functional reaching clipping therapy resistant clothespins (yellow, red, green, blue, black) onto dowel using RUE.  Dowel placed at various heights to facilitate forward flexion at and above shoulder level, and R and L of pt to facilitate horiz add and abduction with RUE at shoulder level.   ? ?Response to Treatment: ?Good tolerance to all activities this day.  Pt reports some difficulty removing change and bills from her wallet and continued struggle to reach high into cupboards.  Pt reports she didn't realize she had dropped bills in her car the other  day.  OT reminded pt to use vision to compensate for numbness in fingertips.  Pt did well picking up coins from table top and placing and removing coins from pocket.  Good ability to sort bills from a large stack, including bills that stuck together.  Pt reports stiffness in L shoulder but 0/10 pain at start of therapy, 1/10 pain after reaching activities, but managed well with frequent rest breaks.  Pt will continue to benefit from skilled OT to maximize R shoulder ROM, RUE strength, and coordination in order to maximize functional use of RUE with daily tasks.   ? ? ? OT Education - 05/18/21 0907   ? ? Education Details RUE functioning   ? Person(s) Educated Patient   ? Methods Explanation   ? Comprehension Verbalized understanding;Returned demonstration;Verbal cues required;Need further instruction   ? ?  ?  ? ?  ? ? ? ? ? ? OT Long Term Goals - 04/25/21 2101   ? ?  ? OT LONG TERM GOAL #1  ? Title Patient will be independent with home exercise program   ? Baseline 03/23/2021: Pt. is independent, 4/5: will continue to upgrade exercises   ? Time 12   ? Period Weeks   ? Status On-going   ? Target Date 07/15/21   ?  ? OT LONG TERM GOAL #2  ? Title Patient will complete basic self-care tasks with modified independence and without increased time to complete tasks   ? Baseline Patient has difficulty with washing her back.  Pt. requires increased time to complete self-care tasks.4/5:  Pt still has some difficulty with reaching to wash her back.   ? Time 6   ? Period Weeks   ? Status Partially Met   ? Target Date 07/15/21   ?  ? OT LONG TERM GOAL #3  ? Title Patient will demonstrate good safety awareness in the kitchen with cooking tasks with modified independence   ? Baseline Pt. requires Supervision for safety awareness.  4/5 goal upgraded to modified independence   ? Time 12   ? Period Weeks   ? Status Revised   ? Target Date 04/25/21   ?  ? OT LONG TERM GOAL #4  ? Title Patient will increase right grip strength by 10  pounds to be able to open jars and containers with modified independence   ? Baseline 28 pounds at eval and difficulty with managing jars and containers. 10th visit: R: 34# Jars,a nd containers continue to be difficulty to open.  4/5: right grip 50#   ? Time 12   ? Period Weeks   ? Status Achieved   ? Target Date 04/25/21   ?  ? OT LONG TERM GOAL #5  ? Title Patient will improve right pinch strength by 5 pounds to pick up and manipulate items with modified independence   ? Baseline Right lateral 5 pounds, 3  point 4#, 10th visit: right lateral pinch 13#   ? Time 12   ? Period Weeks   ? Status Achieved   ? Target Date 04/25/21   ?  ? Long Term Additional Goals  ? Additional Long Term Goals Yes   ?  ? OT LONG TERM GOAL #6  ? Title Patient will decrease right shoulder pain to 2 out of 10 or less with movement patterns for daily activities.   ? Baseline Increased pain in right shoulder, 5 out of 10 with movement. 10th visit: 8/10 right shoulder pain, 4/5:  2/10 with movement today but has increased pain other days and not yet consistent.   ? Time 12   ? Period Weeks   ? Status On-going   ? Target Date 07/15/21   ?  ? OT LONG TERM GOAL #7  ? Title Patient will demonstrate score of 59 or greater on FOTO to show a clinically relevant change to impact self-care activities with greater independence   ? Baseline Score of 55 at eval, 10th visit: FOTO score: 51, increased shoulder  pain limiting overhead activity.  4/5:  score of 64   ? Time 12   ? Period Weeks   ? Status Achieved   ? Target Date 04/25/21   ?  ? OT LONG TERM GOAL #8  ? Title Will assess for potential return to work tasks as patient progresses with therapy   ? Baseline Patient currently unable to perform her job as outlined in her job description   ? Time 12   ? Period Weeks   ? Status On-going   ? Target Date 07/15/21   ?  ? OT LONG TERM GOAL  #9  ? TITLE Pt will improve strength to pick up a case of water to place into her grocery cart   ? Baseline 4/5: unable    ? Time 12   ? Period Weeks   ? Status New   ? Target Date 07/15/21   ?  ? OT LONG TERM GOAL  #10  ? TITLE When on the passenger side, pt will be able to close door with modified independence   ? [employer]

## 2021-05-18 NOTE — Patient Instructions (Signed)
My goal is for you to become totally independent in keeping up with your planner. The best way to do this is to establish a routine.  ? ?Every Month: ? ?On the first of every month, go ahead and look at all of your upcoming plans. Review your appointment schedules and write everything on your calendar. Make sure to write in the regular activities (pay storage fee, Planning cooking on Wednesdays, hair and nail appointments) and anything special (birthdays, family reunions, anniversaries, etc). ? ?Every Week: ?On Wednesdays,  ?Plan what meals you will cook, write your grocery list, and write on your calendar what days you will grocery shop and what days you will cook. ?2. Make sure to write in what days you plan to walk or exercise (try to pick 3 days before the next Wednesday) ? ?Every day: ?Keep doing what you're doing!  ?Look at your planner in the morning to see what you have that day.  ?Check it midday to cross of what you did or many any changes you need to.  ?Check it in the evening to make sure you got everything done and look at what you have planned for the next day.  ?

## 2021-05-20 ENCOUNTER — Ambulatory Visit: Payer: No Typology Code available for payment source | Admitting: Speech Pathology

## 2021-05-20 ENCOUNTER — Ambulatory Visit: Payer: No Typology Code available for payment source | Admitting: Occupational Therapy

## 2021-05-20 ENCOUNTER — Encounter: Payer: Self-pay | Admitting: Occupational Therapy

## 2021-05-20 DIAGNOSIS — R4701 Aphasia: Secondary | ICD-10-CM

## 2021-05-20 DIAGNOSIS — R41841 Cognitive communication deficit: Secondary | ICD-10-CM

## 2021-05-20 DIAGNOSIS — M6281 Muscle weakness (generalized): Secondary | ICD-10-CM

## 2021-05-20 NOTE — Therapy (Signed)
[facility] ?[place], [name], [address] ?Phone: [phone]   Fax:  [phone] ? ?Occupational Therapy Progress Note ? ?Dates of reporting period  03/23/2021   to   05/20/2021  ? ?Patient Details  ?Name: Kim Gomez ?MRN: [id number] ?Date of Birth: Jul 13, 1955 ?Referring Provider (OT): Manuella Ghazi, H ? ? ?Encounter Date: 05/20/2021 ? ? OT End of Session - 05/20/21 1158   ? ? Visit Number 20   ? Number of Visits 48   ? Date for OT Re-Evaluation 07/15/21   ? OT Start Time 1155   ? OT Stop Time 1230   ? OT Time Calculation (min) 35 min   ? Activity Tolerance Patient tolerated treatment well   ? Behavior During Therapy [facility] for tasks assessed/performed   ? ?  ?  ? ?  ? ? ?Past Medical History:  ?Diagnosis Date  ? Anxiety   ? Arthritis   ? legs, hands  ? Cellulitis of right knee   ? started on antibiotics 06/21/15.    ? Depression   ? Family history of adverse reaction to anesthesia   ? sister and mom -PONV  ? Hypercholesteremia   ? Hypertension   ? ? ?Past Surgical History:  ?Procedure Laterality Date  ? ABDOMINAL HYSTERECTOMY    ? COLONOSCOPY WITH PROPOFOL N/A 06/27/2015  ? Procedure: COLONOSCOPY WITH PROPOFOL;  Surgeon: Lucilla Lame, MD;  Location: [place];  Service: Endoscopy;  Laterality: N/A;  ? POLYPECTOMY  06/27/2015  ? Procedure: POLYPECTOMY;  Surgeon: Lucilla Lame, MD;  Location: [facility];  Service: Endoscopy;;  ? TOTAL HIP ARTHROPLASTY Right 09/10/2019  ? Procedure: TOTAL HIP ARTHROPLASTY ANTERIOR APPROACH;  Surgeon: Lovell Sheehan, MD;  Location: [facility];  Service: Orthopedics;  Laterality: Right;  ? ? ?There were no vitals filed for this visit. ? ? Subjective Assessment - 05/20/21 1157   ? ? Subjective  Pt. is doing well today   ? Pertinent History Kim Gomez is a 66 y.o. female with PMH significant for HTN, HLD, anxiety disorder, recently hospitalized for COVID-19 infection (08/06/2020) at an outside facility. Patient was  brought to the ED on 7/24 after she had a sudden onset of numbness involving right side of her body without any motor deficits, dysarthria, dysphagia. 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 Per chart for her f/u neuro visit:Left ischemic thalamic/internal capsule infarct presenting with right-sided numbness, weakness - now with residual cognitive fog (improving), imbalance (improving), fatigue, right first + second digit numbness, in patient with known vascular risk factors of hypertension, dyslipidemia, lack of physical activity, snoring   ? Currently in Pain? No/denies   ? ?  ?  ? ?  ? ? ? ? ? [facility] OT Assessment - 05/20/21 1200   ? ?  ? Coordination  ? Right 9 Hole Peg Test 24   ?  ? AROM  ? Overall AROM Comments Right shoulder flexion 134, abduction 92, elbow extension 0, elbow flexion 144, wrist extension 60   ?  ? Strength  ? Overall Strength Comments Right elbow wrist, strength 4+/5   ?  ? Hand Function  ? Right Hand Grip (lbs) 40   ? Right Hand Lateral Pinch 16 lbs   ? Right Hand 3 Point Pinch 13 lbs   ? ?  ?  ? ?  ? ?Measurements were obtained, and goals were reviewed with the pt. Pt.  has made progress with RUE pain, ROM, and has progressed with using her right hand to engage during ADLs, and IADL tasks. Pt.'s FOTO score has improved to 66 with the TR score of 59. Pt. Is now able to vacuum multiple rooms independently, and is independently performing cooking tasks. Pt.'s right grip strength has decreased by 10# of force. Pt. continues to have difficulty lifting bags for yard work. Pt. continues to work on improving RUE functioning in order to work towards improve engage in , and maximizing independence with ADLs, and IADLs. ? ? ? ? ? ? ? ? ? ? ? ? ? ? ? ? ? OT Education - 05/20/21 1158   ? ? Education Details RUE functioning   ? Person(s) Educated Patient   ? Methods Explanation   ? Comprehension Verbalized understanding;Returned  demonstration;Verbal cues required;Need further instruction   ? ?  ?  ? ?  ? ? ? ? ? ? OT Long Term Goals - 05/20/21 1216   ? ?  ? OT LONG TERM GOAL #1  ? Title Patient will be independent with home exercise program   ? Baseline 03/23/2021: Pt. is independent, 4/5: will continue to upgrade exercises5/03/2021: Independent   ? Time 12   ? Period Weeks   ? Status On-going   ? Target Date 07/15/21   ?  ? OT LONG TERM GOAL #2  ? Title Patient will complete basic self-care tasks with modified independence and without increased time to complete tasks   ? Baseline Patient has difficulty with washing her back.  Pt. requires increased time to complete self-care tasks.4/5:  Pt still has some difficulty with reaching to wash her back. 05/20/2021: Pt. is independent with basic self-care including washing back.   ? Time 6   ? Period Weeks   ? Status Achieved   ? Target Date 07/15/21   ?  ? OT LONG TERM GOAL #3  ? Title Patient will demonstrate good safety awareness in the kitchen with cooking tasks with modified independence   ? Baseline Pt. requires Supervision for safety awareness.  4/5 goal upgraded to modified independence 05/20/2021: Pt. is improving with safety with cooking. Pt. reports that she remains in the kitchen the whole time she is cooking.   ? Time 12   ? Period Weeks   ? Status Achieved   ?  ? OT LONG TERM GOAL #4  ? Title Patient will increase right grip strength by 10 pounds to be able to open jars and containers with modified independence   ? Baseline 28 pounds at eval and difficulty with managing jars and containers. 10th visit: R: 34# Jars,a nd containers continue to be difficulty to open.  4/5: right grip 50# 05/20/2021: right grip strength 40#   ? Time 12   ? Period Weeks   ? Status Revised   ? Target Date 07/15/21   ?  ? OT LONG TERM GOAL #6  ? Title Patient will decrease right shoulder pain to 2 out of 10 or less with movement patterns for daily activities.   ? Baseline Increased pain in right shoulder, 5 out  of 10 with movement. 10th visit: 8/10 right shoulder pain, 4/5:  2/10 with movement today but has increased pain other days and not yet consistent.5/03: 0/10 during movement today.   ? Time 12   ? Period Weeks   ? Status On-going   ? Target Date 07/15/21   ?  ? OT LONG TERM GOAL #7  ?  Title Patient will demonstrate score of 59 or greater on FOTO to show a clinically relevant change to impact self-care activities with greater independence   ? Baseline Score of 55 at eval, 10th visit: FOTO score: 51, increased shoulder  pain limiting overhead activity.  4/5:  score of 64, 5/03/: Foto score 66 with TR score 59   ? Time 12   ? Period Weeks   ? Status Achieved   ?  ? OT LONG TERM GOAL #8  ? Title Will assess for potential return to work tasks as patient progresses with therapy   ? Baseline Patient currently unable to perform her job as outlined in her job description   ? Time 12   ? Period Weeks   ? Status On-going   ? Target Date 07/15/21   ?  ? OT LONG TERM GOAL  #9  ? TITLE Pt will improve strength to pick up a case of water to place into her grocery cart   ? Baseline 05/20/2021: Pt. continues to have difficulty 4/5: unable   ? Time 12   ? Period Weeks   ? Status New   ? Target Date 07/15/21   ?  ? OT LONG TERM GOAL  #10  ? TITLE When on the passenger side, pt will be able to close door with modified independence   ? Baseline 4/5:  difficulty at times   ? Time 6   ? Period Weeks   ? Status New   ? Target Date 06/06/21   ?  ? OT LONG TERM GOAL  #11  ? TITLE Pt will demonstrate ability to vacuum carpet of 2 rooms with use of right UE and no rest breaks   ? Baseline 4/5:  difficulty with using vacuum. 5/03: Pt. has progressed and is now able to vacuum multiple rooms in her home   ? Time 6   ? Period Weeks   ? Status On-going   ? Target Date 06/06/21   ?  ? OT LONG TERM GOAL  #13  ? TITLE Pt will improve strength to pick up 15# bag of fertilizer for yard work 5/03: Pt. continues to have difficulty   ? Baseline 4/5: unable    ? Time 12   ? Period Weeks   ? Status On-going   ? ?  ?  ? ?  ? ? ? ? ? ? ? ? Plan - 05/20/21 1745   ? ? Clinical Impression Statement Measurements were obtained, and goals were reviewed with the pt. Pt. has m

## 2021-05-20 NOTE — Therapy (Signed)
[facility] ?[place], [name], [address] ?Phone: [phone]   Fax:  [phone] ? ?Speech Language Pathology Treatment and Recertification ? ?Patient Details  ?Name: Kim Gomez ?MRN: [id number] ?Date of Birth: [birth year] ?Referring Provider (SLP): Dr. Jennings Books ? ? ?Encounter Date: 05/20/2021 ? ? End of Session - 05/20/21 1144   ? ? Visit Number 25   ? Number of Visits 37   ? Date for SLP Re-Evaluation 08/18/21   ? Authorization - Visit Number 5   ? Progress Note Due on Visit 10   ? SLP Start Time 1100   ? SLP Stop Time  1200   ? SLP Time Calculation (min) 60 min   ? Activity Tolerance Patient tolerated treatment well   ? ?  ?  ? ?  ? ? ?Past Medical History:  ?Diagnosis Date  ? Anxiety   ? Arthritis   ? legs, hands  ? Cellulitis of right knee   ? started on antibiotics 06/21/15.    ? Depression   ? Family history of adverse reaction to anesthesia   ? sister and mom -PONV  ? Hypercholesteremia   ? Hypertension   ? ? ?Past Surgical History:  ?Procedure Laterality Date  ? ABDOMINAL HYSTERECTOMY    ? COLONOSCOPY WITH PROPOFOL N/A 06/27/2015  ? Procedure: COLONOSCOPY WITH PROPOFOL;  Surgeon: Lucilla Lame, MD;  Location: [facility];  Service: Endoscopy;  Laterality: N/A;  ? POLYPECTOMY  06/27/2015  ? Procedure: POLYPECTOMY;  Surgeon: Lucilla Lame, MD;  Location: [facility];  Service: Endoscopy;;  ? TOTAL HIP ARTHROPLASTY Right 09/10/2019  ? Procedure: TOTAL HIP ARTHROPLASTY ANTERIOR APPROACH;  Surgeon: Lovell Sheehan, MD;  Location: [facility];  Service: Orthopedics;  Laterality: Right;  ? ? ?There were no vitals filed for this visit. ? ? Subjective Assessment - 05/20/21 1100   ? ? Subjective "I cooked yesterday."   ? Currently in Pain? No/denies   ? ?  ?  ? ?  ? ? ? ? ? ? ? ? ADULT SLP TREATMENT - 05/20/21 1138   ? ?  ? General Information  ? Behavior/Cognition Alert;Cooperative;Pleasant mood   ? HPI Patient is a 66 y.o. female who  admitted 08/10/20 and found to have Covid-19 as well as left ischemic thalamic/internal capsule infarct. Acute SLP screened and s/o; pt now with residual cognitive fog, imbalance, fatigue, right first + second digit numbness.   ?  ? Cognitive-Linquistic Treatment  ? Treatment focused on Cognition;Patient/family/caregiver education   ? Skilled Treatment Patient reports cooking last night; had not yet planned her cooking for the next week. Initially pt reported feeling unsure of what to make. SLP utilized weekly [employer]/gallery and pt generated ideas for 4 different meals while browsing weekly sales. Pt made grocery list and ID'd days for cooking; wrote these on her calendar. Pt proposed picking up the sales flyer each week to help her when she is making her meal plan. Targeted alternating attention between change calculations and verbal sequencing task; pt accuracy  90% and 80% respectively with min question cues. Pt ID'd and corrected 2/2 errors in calculations with min cue for double checking.   ?  ? Assessment / Recommendations / Plan  ? Plan Goals updated   recertification; will continue at 1x week for 8-12 weeks  ?  ? Progression Toward Goals  ? Progression toward goals Progressing toward goals   ? ?  ?  ? ?  ? ? ?  SLP Education - 05/20/21 1144   ? ? Education Details use sales flyer to help with meal and grocery pre-planning   ? Person(s) Educated Patient   ? Methods Explanation   ? Comprehension Verbalized understanding   ? ?  ?  ? ?  ? ? ? SLP Short Term Goals - 03/26/21 1247   ? ?  ? SLP SHORT TERM GOAL #1  ? Title Pt will verbalize and demonstrate how to implement 2 memory and attention strategies to aid daily functioning given occasional min A over 2 sessions   ? Status Achieved   ?  ? SLP SHORT TERM GOAL #2  ? Title Pt will manage finances with use of compensations with no reported episodes of missed bills given rare min A   ? Status Achieved   ?  ? SLP SHORT TERM GOAL #3  ? Title Pt will identify errors in  writing and self-correct with 80% accuracy given rare min A over 2 sessions   ? Status Achieved   ?  ? SLP SHORT TERM GOAL #4  ? Title Pt will participate in further language assessment as needed (goals to be added)   ? Status Achieved   ? ?  ?  ? ?  ? ? ? SLP Long Term Goals - 05/20/21 1149   ? ?  ? SLP LONG TERM GOAL #1  ? Title Pt will verbalize and implement 4 attention and memory strategies to aid daily functioning for ADLs and IADLs with rare min A over 2 sessions   ? Time 12   ? Period Weeks   ? Status On-going   ? Target Date 08/18/21   ?  ? SLP LONG TERM GOAL #2  ? Title Pt will identify and correct paraphasias in conversation and writing with 90% accuracy given rare min A over 2 sessions   ? Time 12   ? Period Weeks   ? Status Achieved   ?  ? SLP LONG TERM GOAL #3  ? Title Pt will use compensations for thought organization effectively in 20 minutes complex conversation.   ? Baseline mod complex conv achieved 5/3, goal revised   ? Time 12   ? Period Weeks   ? Status Revised   ? Target Date 08/18/21   ?  ? SLP LONG TERM GOAL #4  ? Title Patient will maintain alternating attention between 2 mod complex tasks for 15 minutes with modified independence (strategies allowed).   ? Time 12   ? Period Weeks   ? Status On-going   ? Target Date 08/18/21   ?  ? SLP LONG TERM GOAL #5  ? Title Patient will use strategies and executive function aid to preplan and manage IADLs independently.   ? Time 12   ? Period Weeks   ? Status On-going   ? Target Date 08/18/21   ? ?  ?  ? ?  ? ? ? Plan - 05/20/21 1146   ? ? Clinical Impression Statement Patient presents with mild cognitive communication impairment. Pt gradually recognizing opportunities and initiating use of memory/attention strategies. Pt maintained alternating attention for 15 minutes in mod complex tasks. Recertification completed today; discussed plan of care with pt and she would like to decrease frequency to 1x per week given progress. Current goals remain  appropriateSkilled ST remains necessary for carryover of compensatory strategies and  to maximize pt's cognitive communication and language skills to improve accuracy/independence with ADLs and IADLs, reduce communication breakdowns, and  improve quality of life.   ? Speech Therapy Frequency 1x /week   ? Duration 12 weeks   ? Treatment/Interventions Language facilitation;Environmental controls;SLP instruction and feedback;Cognitive reorganization;Compensatory strategies;Patient/family education;Internal/external aids   ? Potential to Achieve Goals Good   ? SLP Home Exercise Plan see pt instructions   ? Consulted and Agree with Plan of Care Patient   ? ?  ?  ? ?  ? ? ?Patient will benefit from skilled therapeutic intervention in order to improve the following deficits and impairments:   ?Cognitive communication deficit ? ?Aphasia ? ? ? ?Problem List ?Patient Active Problem List  ? Diagnosis Date Noted  ? Acute stroke due to ischemia ([facility]) 08/10/2020  ? 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 Special screening for malignant neoplasms, colon   ? Benign neoplasm of ascending colon   ? Benign neoplasm of descending colon   ? ?Deneise Lever, MS, CCC-SLP ?Speech-Language Pathologist ?[phone] ? ?Aliene Altes, CCC-SLP ?05/20/2021, 12:33 PM ? ?[facility] ?[place], [name], [address] ?Phone: [phone]   Fax:  [phone] ? ? ?Name: Kim Gomez ?MRN: [id number] ?Date of Birth: April 06, 1955 ? ?

## 2021-05-25 ENCOUNTER — Ambulatory Visit: Payer: No Typology Code available for payment source

## 2021-05-25 ENCOUNTER — Encounter: Payer: No Typology Code available for payment source | Admitting: Speech Pathology

## 2021-05-25 ENCOUNTER — Ambulatory Visit (INDEPENDENT_AMBULATORY_CARE_PROVIDER_SITE_OTHER): Payer: No Typology Code available for payment source

## 2021-05-25 DIAGNOSIS — I639 Cerebral infarction, unspecified: Secondary | ICD-10-CM | POA: Diagnosis not present

## 2021-05-25 LAB — CUP PACEART REMOTE DEVICE CHECK
Date Time Interrogation Session: 20230508083009
Implantable Pulse Generator Implant Date: 20230405
Pulse Gen Serial Number: 178997

## 2021-05-26 ENCOUNTER — Ambulatory Visit: Payer: No Typology Code available for payment source

## 2021-05-26 ENCOUNTER — Ambulatory Visit: Payer: No Typology Code available for payment source | Admitting: Physical Therapy

## 2021-05-26 DIAGNOSIS — G8929 Other chronic pain: Secondary | ICD-10-CM

## 2021-05-26 DIAGNOSIS — R41841 Cognitive communication deficit: Secondary | ICD-10-CM | POA: Diagnosis not present

## 2021-05-26 DIAGNOSIS — R278 Other lack of coordination: Secondary | ICD-10-CM

## 2021-05-26 DIAGNOSIS — M25611 Stiffness of right shoulder, not elsewhere classified: Secondary | ICD-10-CM

## 2021-05-26 DIAGNOSIS — M6281 Muscle weakness (generalized): Secondary | ICD-10-CM

## 2021-05-26 NOTE — Therapy (Signed)
[place] [facility] ?[place], [name], [address] ?Phone: [phone]   Fax:  [phone] ? ?Occupational Therapy Treatment ? ?Patient Details  ?Name: Kim Gomez ?MRN: [id number] ?Date of Birth: 1955-05-16 ?Referring Provider (OT): Manuella Ghazi, H ? ? ?Encounter Date: 05/26/2021 ? ? OT End of Session - 05/26/21 0854   ? ? Visit Number 21   ? Number of Visits 48   ? Date for OT Re-Evaluation 07/15/21   ? OT Start Time 0845   ? OT Stop Time 0930   ? OT Time Calculation (min) 45 min   ? Activity Tolerance Patient tolerated treatment well   ? Behavior During Therapy [facility] for tasks assessed/performed   ? ?  ?  ? ?  ? ? ?Past Medical History:  ?Diagnosis Date  ? Anxiety   ? Arthritis   ? legs, hands  ? Cellulitis of right knee   ? started on antibiotics 06/21/15.    ? Depression   ? Family history of adverse reaction to anesthesia   ? sister and mom -PONV  ? Hypercholesteremia   ? Hypertension   ? ? ?Past Surgical History:  ?Procedure Laterality Date  ? ABDOMINAL HYSTERECTOMY    ? COLONOSCOPY WITH PROPOFOL N/A 06/27/2015  ? Procedure: COLONOSCOPY WITH PROPOFOL;  Surgeon: Lucilla Lame, MD;  Location: [place];  Service: Endoscopy;  Laterality: N/A;  ? POLYPECTOMY  06/27/2015  ? Procedure: POLYPECTOMY;  Surgeon: Lucilla Lame, MD;  Location: [place];  Service: Endoscopy;;  ? TOTAL HIP ARTHROPLASTY Right 09/10/2019  ? Procedure: TOTAL HIP ARTHROPLASTY ANTERIOR APPROACH;  Surgeon: Lovell Sheehan, MD;  Location: [facility];  Service: Orthopedics;  Laterality: Right;  ? ? ?There were no vitals filed for this visit. ? ? Subjective Assessment - 05/26/21 0853   ? ? Subjective  Pt reports no pain today, just stiffness in the shoulder.   ? Pertinent History Kim Gomez is a 66 y.o. female with PMH significant for HTN, HLD, anxiety disorder, recently hospitalized for COVID-19 infection (08/06/2020) at an outside facility. Patient was brought to the ED on 7/24  after she had a sudden onset of numbness involving right side of her body without any motor deficits, dysarthria, dysphagia. 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 Per chart for her f/u neuro visit:Left ischemic thalamic/internal capsule infarct presenting with right-sided numbness, weakness - now with residual cognitive fog (improving), imbalance (improving), fatigue, right first + second digit numbness, in patient with known vascular risk factors of hypertension, dyslipidemia, lack of physical activity, snoring   ? Patient Stated Goals Pt reports she wants to get back to doing things more independently like she was before COVID and CVA.   ? Pain Score 0-No pain   ? Pain Onset More than a month ago   ? ?  ?  ? ?  ? ?Occupational Therapy Treatment: ?Therapeutic Exercise: ?Facilitated hand strengthening with use of hand gripper set at 11.2# to remove jumbo pegs from pegboard x3 trials using R hand.  Alternated gripping with pinch strengthening using therapy resistant clips to target lateral and 3 point pinch, completing 2 trials on the R hand for each pinch type with all colors.  Issued pink theraputty and provided handout to target gross grasping, tip pinch, lateral and 3 point pinch, digit abd/add, and digging coins out of putty to facilitate hand strengthening in R dominant hand.  Pt able to return  demo with min vc for technique.  Performed wall slides for R shoulder flexibility for increasing R shoulder flex and abduction with good tolerance, min vc for technique. ? ?Response to Treatment: ?Pt tolerated all therapeutic exercises well with rest breaks and alternating activities.  Pt required cues to support R elbow and forearm on table top during hand strengthening activities to reduce strain and fatigue on R shoulder.  Pt continues to develop R hand strength and coordination and R shoulder flexibility.  Pt reported no pain in R shoulder today, only  feeling stiff.  Pt will continue to benefit from skilled OT to maximize R shoulder ROM, RUE strength, and coordination in order to maximize functional use of RUE with daily tasks.   ? ? ? OT Education - 05/26/21 0854   ? ? Education Details RUE functioning   ? Person(s) Educated Patient   ? Methods Explanation   ? Comprehension Verbalized understanding;Returned demonstration;Verbal cues required;Need further instruction   ? ?  ?  ? ?  ? ? ? ? ? ? OT Long Term Goals - 05/20/21 1216   ? ?  ? OT LONG TERM GOAL #1  ? Title Patient will be independent with home exercise program   ? Baseline 03/23/2021: Pt. is independent, 4/5: will continue to upgrade exercises5/03/2021: Independent   ? Time 12   ? Period Weeks   ? Status On-going   ? Target Date 07/15/21   ?  ? OT LONG TERM GOAL #2  ? Title Patient will complete basic self-care tasks with modified independence and without increased time to complete tasks   ? Baseline Patient has difficulty with washing her back.  Pt. requires increased time to complete self-care tasks.4/5:  Pt still has some difficulty with reaching to wash her back. 05/20/2021: Pt. is independent with basic self-care including washing back.   ? Time 6   ? Period Weeks   ? Status Achieved   ? Target Date 07/15/21   ?  ? OT LONG TERM GOAL #3  ? Title Patient will demonstrate good safety awareness in the kitchen with cooking tasks with modified independence   ? Baseline Pt. requires Supervision for safety awareness.  4/5 goal upgraded to modified independence 05/20/2021: Pt. is improving with safety with cooking. Pt. reports that she remains in the kitchen the whole time she is cooking.   ? Time 12   ? Period Weeks   ? Status Achieved   ?  ? OT LONG TERM GOAL #4  ? Title Patient will increase right grip strength by 10 pounds to be able to open jars and containers with modified independence   ? Baseline 28 pounds at eval and difficulty with managing jars and containers. 10th visit: R: 34# Jars,a nd containers  continue to be difficulty to open.  4/5: right grip 50# 05/20/2021: right grip strength 40#   ? Time 12   ? Period Weeks   ? Status Revised   ? Target Date 07/15/21   ?  ? OT LONG TERM GOAL #6  ? Title Patient will decrease right shoulder pain to 2 out of 10 or less with movement patterns for daily activities.   ? Baseline Increased pain in right shoulder, 5 out of 10 with movement. 10th visit: 8/10 right shoulder pain, 4/5:  2/10 with movement today but has increased pain other days and not yet consistent.5/03: 0/10 during movement today.   ? Time 12   ? Period Weeks   ? Status  On-going   ? Target Date 07/15/21   ?  ? OT LONG TERM GOAL #7  ? Title Patient will demonstrate score of 59 or greater on FOTO to show a clinically relevant change to impact self-care activities with greater independence   ? Baseline Score of 55 at eval, 10th visit: FOTO score: 51, increased shoulder  pain limiting overhead activity.  4/5:  score of 64, 5/03/: Foto score 66 with TR score 59   ? Time 12   ? Period Weeks   ? Status Achieved   ?  ? OT LONG TERM GOAL #8  ? Title Will assess for potential return to work tasks as patient progresses with therapy   ? Baseline Patient currently unable to perform her job as outlined in her job description   ? Time 12   ? Period Weeks   ? Status On-going   ? Target Date 07/15/21   ?  ? OT LONG TERM GOAL  #9  ? TITLE Pt will improve strength to pick up a case of water to place into her grocery cart   ? Baseline 05/20/2021: Pt. continues to have difficulty 4/5: unable   ? Time 12   ? Period Weeks   ? Status New   ? Target Date 07/15/21   ?  ? OT LONG TERM GOAL  #10  ? TITLE When on the passenger side, pt will be able to close door with modified independence   ? Baseline 4/5:  difficulty at times   ? Time 6   ? Period Weeks   ? Status New   ? Target Date 06/06/21   ?  ? OT LONG TERM GOAL  #11  ? TITLE Pt will demonstrate ability to vacuum carpet of 2 rooms with use of right UE and no rest breaks   ?  Baseline 4/5:  difficulty with using vacuum. 5/03: Pt. has progressed and is now able to vacuum multiple rooms in her home   ? Time 6   ? Period Weeks   ? Status On-going   ? Target Date 06/06/21   ?  ? OT

## 2021-05-26 NOTE — Therapy (Signed)
[place] [facility] ?[place], [name], [address] ?Phone: [phone]   Fax:  [phone] ? ?Physical Therapy Treatment/ Physical Therapy Progress Note ? ? ?Dates of reporting period  03/16/21   to   05/26/21 ?  ? ? ?Patient Details  ?Name: Kim Gomez ?MRN: [id number] ?Date of Birth: Dec 18, 1955 ?Referring Provider (PT): Jennings Books MD ? ? ?Encounter Date: 05/26/2021 ? ? PT End of Session - 05/26/21 9983   ? ? Visit Number 20   ? Number of Visits 31   ? Date for PT Re-Evaluation 08/10/21   ? Authorization Time Period Recert 03/26/[id number]   ? Progress Note Due on Visit 20   ? PT Start Time 0802   ? PT Stop Time [id number]   ? PT Time Calculation (min) 41 min   ? Activity Tolerance Patient tolerated treatment well   ? Behavior During Therapy [facility] for tasks assessed/performed   ? ?  ?  ? ?  ? ? ?Past Medical History:  ?Diagnosis Date  ? Anxiety   ? Arthritis   ? legs, hands  ? Cellulitis of right knee   ? started on antibiotics 06/21/15.    ? Depression   ? Family history of adverse reaction to anesthesia   ? sister and mom -PONV  ? Hypercholesteremia   ? Hypertension   ? ? ?Past Surgical History:  ?Procedure Laterality Date  ? ABDOMINAL HYSTERECTOMY    ? COLONOSCOPY WITH PROPOFOL N/A 06/27/2015  ? Procedure: COLONOSCOPY WITH PROPOFOL;  Surgeon: Lucilla Lame, MD;  Location: [place];  Service: Endoscopy;  Laterality: N/A;  ? POLYPECTOMY  06/27/2015  ? Procedure: POLYPECTOMY;  Surgeon: Lucilla Lame, MD;  Location: [place];  Service: Endoscopy;;  ? TOTAL HIP ARTHROPLASTY Right 09/10/2019  ? Procedure: TOTAL HIP ARTHROPLASTY ANTERIOR APPROACH;  Surgeon: Lovell Sheehan, MD;  Location: [facility];  Service: Orthopedics;  Laterality: Right;  ? ? ?There were no vitals filed for this visit. ? ? Subjective Assessment - 05/26/21 0808   ? ? Patient is accompained by: Family member   ? Pertinent History Pt had left thamalmic/interanl capsule infarct on  08/10/20 which has reasulted in right sided weakness and reports of imbalance. Pt reports a fall on october 29th. Pt rpeorts she hurt the R UE with the fall and she reports some stiffness in the knee as well as general weakness on the right side. Pt reports she has had 2 strokes but the first went under the radar and was only evidenced by imaging presented to her by her MD. Pt reports she has been modifying her activities in order to be careful not to fall since the stroke. Reports she has been taking her time with a lot of things. Pt reports she has a membership at [employer] and she has been walking on the treadmill 2-3 times per week, she reports she walks on the treadmill for an hour at a time following working her way up. Pt reports ocassional numbness and tingling in her hands.   ? Limitations House hold activities   ? How long can you walk comfortably? 1 hour with UE support   ? Patient Stated Goals Improve her shoulder pain and function on the right side   ? Pain Onset More than a month ago   ? ?  ?  ? ?  ? ?Last session PT performed re-certification note and all goals were assessed and documented therefore goals not  assessed this date for progress note.  ? ? ?INTERVENTIONS ?  ?Manual: ?pt supine on plinth as PT provides the following interventions- ?PROM R shoulder: flexion, abduction, x multiple reps of each with UE held at end range.  ?ERE and IRWFL and holds in this position not necessary this date  ?AP and PA GH grade 2-3 joint mobilizations x 1-2 min  ?GH distraction x 10 sec holds, gentle in various degrees of motion  ? ?  ? ?Therex: ? ?Pt transitions to L SL, towel roll added under R arm ?2 x 10 shoulder ER with 1 #  ?-cues for ROM  ?-external cue for correct motion  ? ? ?Serratus punch with 3# with shoulder flexion AROM from 90 to 150 holding serratus activation ?X 12 ( set of 8 then set of 4 due to PT noted muscular fatigue  ? ?Supine D1 flexion x 10 RTB  ?-cues for proper muscle activation,  good ROM noted  ? ?SL R shoulder abduction RTB x 10 ?-improved ROM throughout, stated around 90 progressed to 140-150 and pt notes it felt good  ? ?Shoulder external rotation in SL with 1# weight  ?Cues to maintain target shoulder muscle activation and prevent elbow extensors as substitute ? ?PVC exercise: R shoulder flexion 10x through available ROM. In supine with 5 sec holds at end range ? ? ?B shoulder HABD with RTB x 10  ?-cues for " shoulder blade squeze" to improve activation of target musculature  ( middle traps)  ?-increased resistance this session  ? ?  ?Pt educated throughout session about proper posture and technique with exercises. Improved exercise technique, movement at target joints, use of target muscles after min to mod verbal, visual, tactile cues. ? ? ?Note: Portions of this document were prepared using Dragon voice recognition software and although reviewed may contain unintentional dictation errors in syntax, grammar, or spelling. ? ? ? ? ? ? ? ? ? ? ? ? ? ? ? ? ? ? ? ? ? ? ? ? ? ? ? ? ? ? ? ? ? ? PT Short Term Goals - 03/23/21 1656   ? ?  ? PT SHORT TERM GOAL #1  ? Title Patient will be independent in home exercise program to improve strength/mobility for better functional independence with ADLs.   ? Baseline no HEP at this time   ? Time 4   ? Period Weeks   ? Status Achieved   ? Target Date 02/17/21   ?  ? PT SHORT TERM GOAL #2  ? Title Patient will be independent in progressive shoulder home exercise program in order to improve her shoulder strength and function   ? Baseline Patient has initial home exercise program and is comfortable with that but it is yet to be progressed for higher level activities   ? Time 4   ? Period Weeks   ? Status New   ? Target Date 04/20/21   ? ?  ?  ? ?  ? ? ? ? PT Long Term Goals - 05/18/21 0812   ? ?  ? PT LONG TERM GOAL #1  ? Title Patient will increase FOTO score to equal to or greater than  50   to demonstrate statistically significant improvement in  mobility and quality of life.   ? Baseline 41 on 01/20/21   ? Time 8   ? Period Weeks   ? Status Achieved   ? Target Date 03/17/21   ?  ?  PT LONG TERM GOAL #2  ? Title Pt will improve mini BEST test by 4 points or more in order to indicate clinically significant improvent in balance.   ? Baseline see flowsheets 27 on 2/27   ? Time 12   ? Period Weeks   ? Status Achieved   ? Target Date 04/14/21   ?  ? PT LONG TERM GOAL #3  ? Title Patient  will complete five times sit to stand test in < 15 seconds indicating an increased LE strength and improved balance.   ? Baseline 13.9 sec on 2/27   ? Time 12   ? Period Weeks   ? Status Achieved   ? Target Date 04/14/21   ?  ? PT LONG TERM GOAL #4  ? Title Patient will increase 10 meter walk test to >1.15ms as to improve gait speed for better community ambulation and to reduce fall risk.   ? Baseline .86 m/s on 01/20/21 2/27: 1.27 m/s   ? Time 8   ? Period Weeks   ? Status Achieved   ? Target Date 03/17/21   ?  ? PT LONG TERM GOAL #5  ? Title Pty will improve dual task TUG to less than 20 seconds in order to indicate improved cognitive and motor tasks.   ? Baseline 28.82 on 01/20/21 14.98 sec on 2/27   ? Time 12   ? Period Weeks   ? Status Achieved   ? Target Date 04/14/21   ?  ? Additional Long Term Goals  ? Additional Long Term Goals Yes   ?  ? PT LONG TERM GOAL #6  ? Title Patient will improve right shoulder by 1 increment on manual muscle test grading scale in order to indicate improved right upper extremity strength.   ? Baseline 3-/5 her right shoulder flexion and abduction(due to range of motion restriction) 3+ with external rotation at 0 degrees abduction; 05/18/2021=Access Code: [email or web address] URL: [email or web address]   ? Time 12   ? Period Weeks   ? Status Partially Met   ? Target Date 08/10/21   ?  ? PT LONG TERM GOAL #7  ? Title Patient will improve QuickDASH score by 12.5 points in order to indicate significantly improved subjective rating of right shoulder  function   ? Baseline 50% on 03/23/2021; 05/18/2021= 23%   ? Time 8   ? Period Weeks   ? Status Achieved   ? Target Date 05/18/21   ?  ? PT LONG TERM GOAL #8  ? Title Patient will improve right shoulder active range of motion

## 2021-05-27 ENCOUNTER — Encounter: Payer: No Typology Code available for payment source | Admitting: Speech Pathology

## 2021-05-28 ENCOUNTER — Ambulatory Visit: Payer: No Typology Code available for payment source

## 2021-05-28 ENCOUNTER — Ambulatory Visit: Payer: No Typology Code available for payment source | Admitting: Speech Pathology

## 2021-05-28 ENCOUNTER — Ambulatory Visit: Payer: No Typology Code available for payment source | Admitting: Physical Therapy

## 2021-05-28 DIAGNOSIS — M25611 Stiffness of right shoulder, not elsewhere classified: Secondary | ICD-10-CM

## 2021-05-28 DIAGNOSIS — M6281 Muscle weakness (generalized): Secondary | ICD-10-CM

## 2021-05-28 DIAGNOSIS — R41841 Cognitive communication deficit: Secondary | ICD-10-CM | POA: Diagnosis not present

## 2021-05-28 DIAGNOSIS — R278 Other lack of coordination: Secondary | ICD-10-CM

## 2021-05-28 DIAGNOSIS — G8929 Other chronic pain: Secondary | ICD-10-CM

## 2021-05-28 NOTE — Therapy (Signed)
[facility] ?[place], [name], [address] ?Phone: [phone]   Fax:  [phone] ? ?Physical Therapy Treatment ? ?Patient Details  ?Name: Kim Gomez ?MRN: [id number] ?Date of Birth: 1955/12/10 ?Referring Provider (PT): Jennings Books MD ? ? ?Encounter Date: 05/28/2021 ? ? PT End of Session - 05/28/21 7824   ? ? Visit Number 21   ? Number of Visits 31   ? Date for PT Re-Evaluation 08/10/21   ? Authorization Time Period Recert 02/21/5359-4/43/1540   ? Progress Note Due on Visit 30   ? PT Start Time 0805   ? PT Stop Time 0844   ? PT Time Calculation (min) 39 min   ? Activity Tolerance Patient tolerated treatment well   ? Behavior During Therapy [facility] for tasks assessed/performed   ? ?  ?  ? ?  ? ? ?Past Medical History:  ?Diagnosis Date  ? Anxiety   ? Arthritis   ? legs, hands  ? Cellulitis of right knee   ? started on antibiotics 06/21/15.    ? Depression   ? Family history of adverse reaction to anesthesia   ? sister and mom -PONV  ? Hypercholesteremia   ? Hypertension   ? ? ?Past Surgical History:  ?Procedure Laterality Date  ? ABDOMINAL HYSTERECTOMY    ? COLONOSCOPY WITH PROPOFOL N/A 06/27/2015  ? Procedure: COLONOSCOPY WITH PROPOFOL;  Surgeon: Lucilla Lame, MD;  Location: [place];  Service: Endoscopy;  Laterality: N/A;  ? POLYPECTOMY  06/27/2015  ? Procedure: POLYPECTOMY;  Surgeon: Lucilla Lame, MD;  Location: [place];  Service: Endoscopy;;  ? TOTAL HIP ARTHROPLASTY Right 09/10/2019  ? Procedure: TOTAL HIP ARTHROPLASTY ANTERIOR APPROACH;  Surgeon: Lovell Sheehan, MD;  Location: [facility];  Service: Orthopedics;  Laterality: Right;  ? ? ?There were no vitals filed for this visit. ? ? Subjective Assessment - 05/28/21 0808   ? ? Subjective Pt reports she is feeling well and doing more for herself as far as using her arm.   ? Patient is accompained by: Family member   ? Pertinent History Pt had left thamalmic/interanl capsule infarct  on 08/10/20 which has reasulted in right sided weakness and reports of imbalance. Pt reports a fall on october 29th. Pt rpeorts she hurt the R UE with the fall and she reports some stiffness in the knee as well as general weakness on the right side. Pt reports she has had 2 strokes but the first went under the radar and was only evidenced by imaging presented to her by her MD. Pt reports she has been modifying her activities in order to be careful not to fall since the stroke. Reports she has been taking her time with a lot of things. Pt reports she has a membership at [employer] and she has been walking on the treadmill 2-3 times per week, she reports she walks on the treadmill for an hour at a time following working her way up. Pt reports ocassional numbness and tingling in her hands.   ? Limitations House hold activities   ? How long can you walk comfortably? 1 hour with UE support   ? Patient Stated Goals Improve her shoulder pain and function on the right side   ? Pain Onset More than a month ago   ? ?  ?  ? ?  ? ? ? ?Last session PT performed re-certification note and all goals were assessed and documented therefore  goals not assessed this date for progress note.  ? ? ?INTERVENTIONS ?  ?Manual: ?pt supine on plinth as PT provides the following interventions- ?PROM R shoulder: flexion, abduction, x multiple reps of each with UE held at end range.  ?AP and PA GH grade 2-3 joint mobilizations x 1-2 min  ?GH distraction x 10 sec holds, gentle in various degrees of motion  ? ? ?Therex: ? ?Pt transitions to L SL, towel roll added under R arm ?2 x 10 shoulder ER with 1 #  ?-fewer cues this session.  ? ?Serratus punch with 3# with shoulder flexion AROM from 90 to 150 holding serratus activation ?X 12 ( set of 8 then set of 4 due to PT noted muscular fatigue  ? ?Supine D1 flexion x 10 RTB  ?-cues for proper muscle activation, good ROM noted  ? ?SL R shoulder abduction RTB x 10 ?-improved ROM throughout, stated  around 90 progressed to 140-150 and pt notes it felt good  ? ?Shoulder external rotation in SL with 1# weight  ?Cues to maintain target shoulder muscle activation and prevent elbow extensors as substitute ? ?Wall slides flexion and abduction x 10 ea with 5 second holds  ?-145 shoulder ABD and 155 shoulder flexion measured with this on last repetitions.  ? ?  ?Pt educated throughout session about proper posture and technique with exercises. Improved exercise technique, movement at target joints, use of target muscles after min to mod verbal, visual, tactile cues. ? ? ?Note: Portions of this document were prepared using Dragon voice recognition software and although reviewed may contain unintentional dictation errors in syntax, grammar, or spelling. ? ? ? ? ? ? ? ? ? ? ? ? ? ? ? ? ? ? ? ? ? ? ? ? ? ? ? ? ? ? ? PT Education - 05/28/21 0827   ? ? Education Details Exercise form and technique   ? Person(s) Educated Patient   ? Methods Explanation   ? Comprehension Verbalized understanding;Returned demonstration   ? ?  ?  ? ?  ? ? ? PT Short Term Goals - 03/23/21 1656   ? ?  ? PT SHORT TERM GOAL #1  ? Title Patient will be independent in home exercise program to improve strength/mobility for better functional independence with ADLs.   ? Baseline no HEP at this time   ? Time 4   ? Period Weeks   ? Status Achieved   ? Target Date 02/17/21   ?  ? PT SHORT TERM GOAL #2  ? Title Patient will be independent in progressive shoulder home exercise program in order to improve her shoulder strength and function   ? Baseline Patient has initial home exercise program and is comfortable with that but it is yet to be progressed for higher level activities   ? Time 4   ? Period Weeks   ? Status New   ? Target Date 04/20/21   ? ?  ?  ? ?  ? ? ? ? PT Long Term Goals - 05/18/21 0812   ? ?  ? PT LONG TERM GOAL #1  ? Title Patient will increase FOTO score to equal to or greater than  50   to demonstrate statistically significant improvement  in mobility and quality of life.   ? Baseline 41 on 01/20/21   ? Time 8   ? Period Weeks   ? Status Achieved   ? Target Date 03/17/21   ?  ?  PT LONG TERM GOAL #2  ? Title Pt will improve mini BEST test by 4 points or more in order to indicate clinically significant improvent in balance.   ? Baseline see flowsheets 27 on 2/27   ? Time 12   ? Period Weeks   ? Status Achieved   ? Target Date 04/14/21   ?  ? PT LONG TERM GOAL #3  ? Title Patient  will complete five times sit to stand test in < 15 seconds indicating an increased LE strength and improved balance.   ? Baseline 13.9 sec on 2/27   ? Time 12   ? Period Weeks   ? Status Achieved   ? Target Date 04/14/21   ?  ? PT LONG TERM GOAL #4  ? Title Patient will increase 10 meter walk test to >1.55ms as to improve gait speed for better community ambulation and to reduce fall risk.   ? Baseline .86 m/s on 01/20/21 2/27: 1.27 m/s   ? Time 8   ? Period Weeks   ? Status Achieved   ? Target Date 03/17/21   ?  ? PT LONG TERM GOAL #5  ? Title Pty will improve dual task TUG to less than 20 seconds in order to indicate improved cognitive and motor tasks.   ? Baseline 28.82 on 01/20/21 14.98 sec on 2/27   ? Time 12   ? Period Weeks   ? Status Achieved   ? Target Date 04/14/21   ?  ? Additional Long Term Goals  ? Additional Long Term Goals Yes   ?  ? PT LONG TERM GOAL #6  ? Title Patient will improve right shoulder by 1 increment on manual muscle test grading scale in order to indicate improved right upper extremity strength.   ? Baseline 3-/5 her right shoulder flexion and abduction(due to range of motion restriction) 3+ with external rotation at 0 degrees abduction; 05/18/2021=Access Code: [email or web address] URL: [email or web address]   ? Time 12   ? Period Weeks   ? Status Partially Met   ? Target Date 08/10/21   ?  ? PT LONG TERM GOAL #7  ? Title Patient will improve QuickDASH score by 12.5 points in order to indicate significantly improved subjective rating of right shoulder  function   ? Baseline 50% on 03/23/2021; 05/18/2021= 23%   ? Time 8   ? Period Weeks   ? Status Achieved   ? Target Date 05/18/21   ?  ? PT LONG TERM GOAL #8  ? Title Patient will improve right shoulder active rang

## 2021-05-28 NOTE — Therapy (Signed)
[place] ?[place], [name], [address] ?Phone: [phone]   Fax:  [phone] ? ?Occupational Therapy Treatment ? ?Patient Details  ?Name: Kim Gomez ?MRN: [id number] ?Date of Birth: [birth year] ?Referring Provider (OT): Manuella Ghazi, H ? ? ?Encounter Date: 05/28/2021 ? ? OT End of Session - 05/28/21 0905   ? ? Visit Number 22   ? Number of Visits 48   ? Date for OT Re-Evaluation 07/15/21   ? OT Start Time 0845   ? OT Stop Time 0930   ? OT Time Calculation (min) 45 min   ? Activity Tolerance Patient tolerated treatment well   ? Behavior During Therapy [facility] for tasks assessed/performed   ? ?  ?  ? ?  ? ? ?Past Medical History:  ?Diagnosis Date  ? Anxiety   ? Arthritis   ? legs, hands  ? Cellulitis of right knee   ? started on antibiotics 06/21/15.    ? Depression   ? Family history of adverse reaction to anesthesia   ? sister and mom -PONV  ? Hypercholesteremia   ? Hypertension   ? ? ?Past Surgical History:  ?Procedure Laterality Date  ? ABDOMINAL HYSTERECTOMY    ? COLONOSCOPY WITH PROPOFOL N/A 06/27/2015  ? Procedure: COLONOSCOPY WITH PROPOFOL;  Surgeon: Lucilla Lame, MD;  Location: [place];  Service: Endoscopy;  Laterality: N/A;  ? POLYPECTOMY  06/27/2015  ? Procedure: POLYPECTOMY;  Surgeon: Lucilla Lame, MD;  Location: [place];  Service: Endoscopy;;  ? TOTAL HIP ARTHROPLASTY Right 09/10/2019  ? Procedure: TOTAL HIP ARTHROPLASTY ANTERIOR APPROACH;  Surgeon: Lovell Sheehan, MD;  Location: [facility];  Service: Orthopedics;  Laterality: Right;  ? ? ?There were no vitals filed for this visit. ? ? Subjective Assessment - 05/28/21 0901   ? ? Subjective  Pt reports she will have cataract sx on the L eye next week.   ? Pertinent History Kim Gomez is a 66 y.o. female with PMH significant for HTN, HLD, anxiety disorder, recently hospitalized for COVID-19 infection (08/06/2020) at an outside facility. Patient was brought to the ED on  7/24 after she had a sudden onset of numbness involving right side of her body without any motor deficits, dysarthria, dysphagia. 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 Per chart for her f/u neuro visit:Left ischemic thalamic/internal capsule infarct presenting with right-sided numbness, weakness - now with residual cognitive fog (improving), imbalance (improving), fatigue, right first + second digit numbness, in patient with known vascular risk factors of hypertension, dyslipidemia, lack of physical activity, snoring   ? Patient Stated Goals Pt reports she wants to get back to doing things more independently like she was before COVID and CVA.   ? Currently in Pain? No/denies   ? Pain Score 0-No pain   ? Pain Onset More than a month ago   ? ?  ?  ? ?  ? ?Occupational Therapy Treatment: ?Therapeutic Exercise: ?Completed a 3-5 repetitions of each putty exercise for review with good demo.  Pt reports she's been working on her putty exercises at home.  Used hand gripper with 1 green band to complete 3 sets 10 reps of gross grasping, then 2 more sets end of session, working to increase grip strength for holding and carrying ADL supplies.  Facilitated R [place] working with grooved pegs placed on an incline to promote R shoulder flexion.  Moved pegboard R/L on  table top for 2 more rounds to promote shoulder abd and shoulder horizontal add when reaching for pegs.  Practiced rotating pegs 180 degrees within fingertips and placing back into a second row, worked gathering multiple pegs into palm, and discarding 1 at a time without dropping.  Further facilitated reaching with RUE for shoulder flex, abd, and horiz add using RUE to place magnets above shoulder level in these planes of movement x2 sets of 10 reps each plane.  Rest between sets and min vc for maintaining body mechanics.  Reviewed table slides in sitting and standing to perform shoulder flex, abd, and  horiz abd/add with min vc for technique. ? ?Response to Treatment: ?Pt continues to report no pain in R shoulder, just stiffness.  Pt tolerated all functional reaching well with rest breaks and intermittent stretching.  Pt continues to progress [facility] skills in the R hand.  Occasional dropping of smaller items in hand (grooved pegs), but increasing consistency overall.  Pt will continue to benefit from skilled OT to maximize R shoulder ROM, RUE strength, and coordination in order to maximize functional use of RUE with daily tasks.   ? ? ? ? ? ? ? OT Education - 05/28/21 0905   ? ? Education Details RUE functioning   ? Person(s) Educated Patient   ? Methods Explanation   ? Comprehension Verbalized understanding;Returned demonstration;Verbal cues required;Need further instruction   ? ?  ?  ? ?  ? ? ? ? ? ? OT Long Term Goals - 05/20/21 1216   ? ?  ? OT LONG TERM GOAL #1  ? Title Patient will be independent with home exercise program   ? Baseline 03/23/2021: Pt. is independent, 4/5: will continue to upgrade exercises5/03/2021: Independent   ? Time 12   ? Period Weeks   ? Status On-going   ? Target Date 07/15/21   ?  ? OT LONG TERM GOAL #2  ? Title Patient will complete basic self-care tasks with modified independence and without increased time to complete tasks   ? Baseline Patient has difficulty with washing her back.  Pt. requires increased time to complete self-care tasks.4/5:  Pt still has some difficulty with reaching to wash her back. 05/20/2021: Pt. is independent with basic self-care including washing back.   ? Time 6   ? Period Weeks   ? Status Achieved   ? Target Date 07/15/21   ?  ? OT LONG TERM GOAL #3  ? Title Patient will demonstrate good safety awareness in the kitchen with cooking tasks with modified independence   ? Baseline Pt. requires Supervision for safety awareness.  4/5 goal upgraded to modified independence 05/20/2021: Pt. is improving with safety with cooking. Pt. reports that she remains in the  kitchen the whole time she is cooking.   ? Time 12   ? Period Weeks   ? Status Achieved   ?  ? OT LONG TERM GOAL #4  ? Title Patient will increase right grip strength by 10 pounds to be able to open jars and containers with modified independence   ? Baseline 28 pounds at eval and difficulty with managing jars and containers. 10th visit: R: 34# Jars,a nd containers continue to be difficulty to open.  4/5: right grip 50# 05/20/2021: right grip strength 40#   ? Time 12   ? Period Weeks   ? Status Revised   ? Target Date 07/15/21   ?  ? OT LONG TERM GOAL #6  ? Title  Patient will decrease right shoulder pain to 2 out of 10 or less with movement patterns for daily activities.   ? Baseline Increased pain in right shoulder, 5 out of 10 with movement. 10th visit: 8/10 right shoulder pain, 4/5:  2/10 with movement today but has increased pain other days and not yet consistent.5/03: 0/10 during movement today.   ? Time 12   ? Period Weeks   ? Status On-going   ? Target Date 07/15/21   ?  ? OT LONG TERM GOAL #7  ? Title Patient will demonstrate score of 59 or greater on FOTO to show a clinically relevant change to impact self-care activities with greater independence   ? Baseline Score of 55 at eval, 10th visit: FOTO score: 51, increased shoulder  pain limiting overhead activity.  4/5:  score of 64, 5/03/: Foto score 66 with TR score 59   ? Time 12   ? Period Weeks   ? Status Achieved   ?  ? OT LONG TERM GOAL #8  ? Title Will assess for potential return to work tasks as patient progresses with therapy   ? Baseline Patient currently unable to perform her job as outlined in her job description   ? Time 12   ? Period Weeks   ? Status On-going   ? Target Date 07/15/21   ?  ? OT LONG TERM GOAL  #9  ? TITLE Pt will improve strength to pick up a case of water to place into her grocery cart   ? Baseline 05/20/2021: Pt. continues to have difficulty 4/5: unable   ? Time 12   ? Period Weeks   ? Status New   ? Target Date 07/15/21   ?  ? OT  LONG TERM GOAL  #10  ? TITLE When on the passenger side, pt will be able to close door with modified independence   ? Baseline 4/5:  difficulty at times   ? Time 6   ? Period Weeks   ? Status New   ? Target D

## 2021-06-01 ENCOUNTER — Ambulatory Visit: Payer: No Typology Code available for payment source

## 2021-06-01 ENCOUNTER — Encounter: Payer: No Typology Code available for payment source | Admitting: Speech Pathology

## 2021-06-02 ENCOUNTER — Encounter: Payer: Self-pay | Admitting: Cardiology

## 2021-06-02 ENCOUNTER — Ambulatory Visit: Payer: No Typology Code available for payment source | Admitting: Physical Therapy

## 2021-06-04 ENCOUNTER — Ambulatory Visit: Payer: No Typology Code available for payment source

## 2021-06-04 ENCOUNTER — Ambulatory Visit: Payer: No Typology Code available for payment source | Admitting: Physical Therapy

## 2021-06-04 ENCOUNTER — Ambulatory Visit: Payer: No Typology Code available for payment source | Admitting: Speech Pathology

## 2021-06-08 ENCOUNTER — Encounter: Payer: No Typology Code available for payment source | Admitting: Speech Pathology

## 2021-06-09 ENCOUNTER — Ambulatory Visit: Payer: No Typology Code available for payment source | Admitting: Physical Therapy

## 2021-06-09 ENCOUNTER — Encounter: Payer: No Typology Code available for payment source | Admitting: Occupational Therapy

## 2021-06-09 ENCOUNTER — Encounter: Payer: No Typology Code available for payment source | Admitting: Speech Pathology

## 2021-06-09 ENCOUNTER — Ambulatory Visit: Payer: No Typology Code available for payment source

## 2021-06-09 DIAGNOSIS — R278 Other lack of coordination: Secondary | ICD-10-CM

## 2021-06-09 DIAGNOSIS — M6281 Muscle weakness (generalized): Secondary | ICD-10-CM

## 2021-06-09 DIAGNOSIS — R41841 Cognitive communication deficit: Secondary | ICD-10-CM | POA: Diagnosis not present

## 2021-06-09 NOTE — Therapy (Signed)
[place] MAIN [facility] SERVICES [address] [place], [name], [address] Phone: [phone]   Fax:  [phone]  Occupational Therapy Treatment  Patient Details  Name: Kim Gomez MRN: [id number] Date of Birth: 09-27-1955 Referring Provider (OT): Joselyn Arrow   Encounter Date: 06/09/2021   OT End of Session - 06/09/21 0914     Visit Number 23    Number of Visits 48    Date for OT Re-Evaluation 07/15/21    OT Start Time 0845    OT Stop Time 0930    OT Time Calculation (min) 45 min    Activity Tolerance Patient tolerated treatment well    Behavior During Therapy [facility] for tasks assessed/performed             Past Medical History:  Diagnosis Date   Anxiety    Arthritis    legs, hands   Cellulitis of right knee    started on antibiotics 06/21/15.     Depression    Family history of adverse reaction to anesthesia    sister and mom -PONV   Hypercholesteremia    Hypertension     Past Surgical History:  Procedure Laterality Date   ABDOMINAL HYSTERECTOMY     COLONOSCOPY WITH PROPOFOL N/A 06/27/2015   Procedure: COLONOSCOPY WITH PROPOFOL;  Surgeon: Lucilla Lame, MD;  Location: [place];  Service: Endoscopy;  Laterality: N/A;   POLYPECTOMY  06/27/2015   Procedure: POLYPECTOMY;  Surgeon: Lucilla Lame, MD;  Location: [place];  Service: Endoscopy;;   TOTAL HIP ARTHROPLASTY Right 09/10/2019   Procedure: TOTAL HIP ARTHROPLASTY ANTERIOR APPROACH;  Surgeon: Lovell Sheehan, MD;  Location: [facility];  Service: Orthopedics;  Laterality: Right;    There were no vitals filed for this visit.   Subjective Assessment - 06/09/21 0911     Subjective  Pt reports her cataract sx went very well last week.    Pertinent History Kim Gomez is a 66 y.o. female with PMH significant for HTN, HLD, anxiety disorder, recently hospitalized for COVID-19 infection (08/06/2020) at an outside facility. Patient was brought to the ED on 7/24 after  she had a sudden onset of numbness involving right side of her body without any motor deficits, dysarthria, dysphagia. 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 Per chart for her f/u neuro visit:Left ischemic thalamic/internal capsule infarct presenting with right-sided numbness, weakness - now with residual cognitive fog (improving), imbalance (improving), fatigue, right first + second digit numbness, in patient with known vascular risk factors of hypertension, dyslipidemia, lack of physical activity, snoring    Patient Stated Goals Pt reports she wants to get back to doing things more independently like she was before COVID and CVA.    Currently in Pain? No/denies    Pain Score 0-No pain    Pain Onset More than a month ago            Occupational Therapy Treatment: Therapeutic Exercise: Facilitated grip strengthening with use of hand gripper set at moderate resistance with 1 green band for 1 set of 10, and 1 green/1 red band for 2 more sets of 10 reps.  Instructed pt in self passive stretch for R shoulder flex/abd/ER/IR, and performed active shoulder shrugs and circles in prep for functional reaching.  Facilitated pinch strengthening and shoulder strengthening clipping therapy resistant clothespins to horizontal yardstick using 3 point pinch.  Yardstick was moved to promote forward flex, abd, and horiz  add at shoulder level height, min vc to avoid forward lean or twisting at the trunk.  Frequent rest breaks needed d/t R shoulder fatigue.  Performed wall slides for R shoulder flexion and abd end of session, cued to perform to tolerance.   Neuro re-ed: Facilitated increasing R hand dexterity with manipulation of Jamar pegs.  Pt tasked to pick up 2 pegs at a time, and discard 1 at a time onto pegboard without dropping the stored peg in palm.  Then removed a row of pegs, picking up 1 at a time, storing pegs in palm, working to discard pegs  1 at a time into dish without dropping stored pegs in palm.  Occasional dropping with each step.   Response to Treatment: Pt tolerated all functional reaching well with rest breaks and intermittent stretching.  Pt continues to progress [facility] skills in the R hand.  Occasional dropping of smaller items in hand, but pt continues to increase consistency overall.  Pt states that she continues to have difficulty on occasion opening bottles or a jug of milk.  Pt will continue to benefit from skilled OT to maximize R shoulder ROM, RUE strength, and coordination in order to maximize functional use of RUE with daily tasks.        OT Education - 06/09/21 0914     Education Details RUE functioning    Person(s) Educated Patient    Methods Explanation    Comprehension Verbalized understanding;Returned demonstration;Verbal cues required;Need further instruction                 OT Long Term Goals - 05/20/21 1216       OT LONG TERM GOAL #1   Title Patient will be independent with home exercise program    Baseline 03/23/2021: Pt. is independent, 4/5: will continue to upgrade exercises5/03/2021: Independent    Time 12    Period Weeks    Status On-going    Target Date 07/15/21      OT LONG TERM GOAL #2   Title Patient will complete basic self-care tasks with modified independence and without increased time to complete tasks    Baseline Patient has difficulty with washing her back.  Pt. requires increased time to complete self-care tasks.4/5:  Pt still has some difficulty with reaching to wash her back. 05/20/2021: Pt. is independent with basic self-care including washing back.    Time 6    Period Weeks    Status Achieved    Target Date 07/15/21      OT LONG TERM GOAL #3   Title Patient will demonstrate good safety awareness in the kitchen with cooking tasks with modified independence    Baseline Pt. requires Supervision for safety awareness.  4/5 goal upgraded to modified independence 05/20/2021:  Pt. is improving with safety with cooking. Pt. reports that she remains in the kitchen the whole time she is cooking.    Time 12    Period Weeks    Status Achieved      OT LONG TERM GOAL #4   Title Patient will increase right grip strength by 10 pounds to be able to open jars and containers with modified independence    Baseline 28 pounds at eval and difficulty with managing jars and containers. 10th visit: R: 34# Jars,a nd containers continue to be difficulty to open.  4/5: right grip 50# 05/20/2021: right grip strength 40#    Time 12    Period Weeks    Status Revised  Target Date 07/15/21      OT LONG TERM GOAL #6   Title Patient will decrease right shoulder pain to 2 out of 10 or less with movement patterns for daily activities.    Baseline Increased pain in right shoulder, 5 out of 10 with movement. 10th visit: 8/10 right shoulder pain, 4/5:  2/10 with movement today but has increased pain other days and not yet consistent.5/03: 0/10 during movement today.    Time 12    Period Weeks    Status On-going    Target Date 07/15/21      OT LONG TERM GOAL #7   Title Patient will demonstrate score of 59 or greater on FOTO to show a clinically relevant change to impact self-care activities with greater independence    Baseline Score of 55 at eval, 10th visit: FOTO score: 51, increased shoulder  pain limiting overhead activity.  4/5:  score of 64, 5/03/: Foto score 66 with TR score 59    Time 12    Period Weeks    Status Achieved      OT LONG TERM GOAL #8   Title Will assess for potential return to work tasks as patient progresses with therapy    Baseline Patient currently unable to perform her job as outlined in her job description    Time 12    Period Weeks    Status On-going    Target Date 07/15/21      OT LONG TERM GOAL  #9   TITLE Pt will improve strength to pick up a case of water to place into her grocery cart    Baseline 05/20/2021: Pt. continues to have difficulty 4/5: unable     Time 12    Period Weeks    Status New    Target Date 07/15/21      OT LONG TERM GOAL  #10   TITLE When on the passenger side, pt will be able to close door with modified independence    Baseline 4/5:  difficulty at times    Time 6    Period Weeks    Status New    Target Date 06/06/21      OT LONG TERM GOAL  #11   TITLE Pt will demonstrate ability to vacuum carpet of 2 rooms with use of right UE and no rest breaks    Baseline 4/5:  difficulty with using vacuum. 5/03: Pt. has progressed and is now able to vacuum multiple rooms in her home    Time 6    Period Weeks    Status On-going    Target Date 06/06/21      OT LONG TERM GOAL  #13   TITLE Pt will improve strength to pick up 15# bag of fertilizer for yard work 5/03: Pt. continues to have difficulty    Baseline 4/5: unable    Time 12    Period Weeks    Status On-going              Plan - 06/09/21 0945     Clinical Impression Statement Pt tolerated all functional reaching well with rest breaks and intermittent stretching.  Pt continues to progress [facility] skills in the R hand.  Occasional dropping of smaller items in hand, but pt continues to increase consistency overall.  Pt states that she continues to have difficulty on occasion opening bottles or a jug of milk.  Pt will continue to benefit from skilled OT to maximize R shoulder ROM, RUE  strength, and coordination in order to maximize functional use of RUE with daily tasks.    OT Occupational Profile and History Problem Focused Assessment - Including review of records relating to presenting problem    Occupational performance deficits (Please refer to evaluation for details): ADL's;IADL's;Social Participation    Body Structure / Function / Physical Skills ADL;Dexterity;ROM;Strength;Coordination;FMC;IADL;Pain;Sensation;UE functional use;Decreased knowledge of use of DME    Cognitive Skills Memory;Safety Awareness    Psychosocial Skills Environmental   Adaptations;Habits;Routines and Behaviors    Rehab Potential Good    Clinical Decision Making Several treatment options, min-mod task modification necessary    Comorbidities Affecting Occupational Performance: May have comorbidities impacting occupational performance    Modification or Assistance to Complete Evaluation  No modification of tasks or assist necessary to complete eval    OT Frequency 2x / week    OT Duration 12 weeks    OT Treatment/Interventions Self-care/ADL training;Cryotherapy;Paraffin;Therapeutic exercise;DME and/or AE instruction;Cognitive remediation/compensation;Neuromuscular education;Manual Therapy;Moist Heat;Contrast Bath;Therapeutic activities;Patient/family education    Consulted and Agree with Plan of Care Patient             Patient will benefit from skilled therapeutic intervention in order to improve the following deficits and impairments:   Body Structure / Function / Physical Skills: ADL, Dexterity, ROM, Strength, Coordination, FMC, IADL, Pain, Sensation, UE functional use, Decreased knowledge of use of DME Cognitive Skills: Memory, Safety Awareness Psychosocial Skills: Environmental  Adaptations, Habits, Routines and Behaviors   Visit Diagnosis: Muscle weakness (generalized)  Other lack of coordination    Problem List Patient Active Problem List   Diagnosis Date Noted   Acute stroke due to ischemia ([place]) 08/10/2020   History of total hip replacement, right 09/10/2019   Special screening for malignant neoplasms, colon    Benign neoplasm of ascending colon    Benign neoplasm of descending colon    Leta Speller, MS, OTR/L  Darleene Cleaver, OT 06/09/2021, 9:46 AM  [place] [address] [place], [name], [address] Phone: [phone]   Fax:  [phone]  Name: Kim Gomez MRN: [id number] Date of Birth: 02/01/1955

## 2021-06-10 NOTE — Progress Notes (Signed)
[employer] summary report. 

## 2021-06-11 ENCOUNTER — Ambulatory Visit: Payer: No Typology Code available for payment source

## 2021-06-11 ENCOUNTER — Ambulatory Visit: Payer: No Typology Code available for payment source | Admitting: Speech Pathology

## 2021-06-11 ENCOUNTER — Ambulatory Visit: Payer: No Typology Code available for payment source | Admitting: Physical Therapy

## 2021-06-16 ENCOUNTER — Ambulatory Visit: Payer: No Typology Code available for payment source | Admitting: Physical Therapy

## 2021-06-16 ENCOUNTER — Ambulatory Visit: Payer: No Typology Code available for payment source | Admitting: Occupational Therapy

## 2021-06-16 ENCOUNTER — Encounter: Payer: No Typology Code available for payment source | Admitting: Speech Pathology

## 2021-06-16 ENCOUNTER — Encounter: Payer: Self-pay | Admitting: Occupational Therapy

## 2021-06-16 DIAGNOSIS — R41841 Cognitive communication deficit: Secondary | ICD-10-CM | POA: Diagnosis not present

## 2021-06-16 DIAGNOSIS — M6281 Muscle weakness (generalized): Secondary | ICD-10-CM

## 2021-06-16 DIAGNOSIS — R278 Other lack of coordination: Secondary | ICD-10-CM

## 2021-06-16 NOTE — Therapy (Signed)
[place] MAIN [facility] SERVICES [address] [place], [name], [address] Phone: [phone]   Fax:  [phone]  Occupational Therapy Treatment  Patient Details  Name: Kim Gomez MRN: [id number] Date of Birth: 1955/05/15 Referring Provider (OT): Joselyn Arrow   Encounter Date: 06/16/2021   OT End of Session - 06/16/21 0937     Visit Number 24    Number of Visits 48    Date for OT Re-Evaluation 07/15/21    OT Start Time 0920    OT Stop Time 1000    OT Time Calculation (min) 40 min    Activity Tolerance Patient tolerated treatment well    Behavior During Therapy [facility] for tasks assessed/performed             Past Medical History:  Diagnosis Date   Anxiety    Arthritis    legs, hands   Cellulitis of right knee    started on antibiotics 06/21/15.     Depression    Family history of adverse reaction to anesthesia    sister and mom -PONV   Hypercholesteremia    Hypertension     Past Surgical History:  Procedure Laterality Date   ABDOMINAL HYSTERECTOMY     COLONOSCOPY WITH PROPOFOL N/A 06/27/2015   Procedure: COLONOSCOPY WITH PROPOFOL;  Surgeon: Lucilla Lame, MD;  Location: [place];  Service: Endoscopy;  Laterality: N/A;   POLYPECTOMY  06/27/2015   Procedure: POLYPECTOMY;  Surgeon: Lucilla Lame, MD;  Location: [place];  Service: Endoscopy;;   TOTAL HIP ARTHROPLASTY Right 09/10/2019   Procedure: TOTAL HIP ARTHROPLASTY ANTERIOR APPROACH;  Surgeon: Lovell Sheehan, MD;  Location: [facility];  Service: Orthopedics;  Laterality: Right;    There were no vitals filed for this visit.   Subjective Assessment - 06/16/21 0932     Subjective  Pt reports her cataract sx went very well last week.    Pertinent History Kim Gomez is a 66 y.o. female with PMH significant for HTN, HLD, anxiety disorder, recently hospitalized for COVID-19 infection (08/06/2020) at an outside facility. Patient was brought to the ED on 7/24 after  she had a sudden onset of numbness involving right side of her body without any motor deficits, dysarthria, dysphagia. 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 Per chart for her f/u neuro visit:Left ischemic thalamic/internal capsule infarct presenting with right-sided numbness, weakness - now with residual cognitive fog (improving), imbalance (improving), fatigue, right first + second digit numbness, in patient with known vascular risk factors of hypertension, dyslipidemia, lack of physical activity, snoring    Patient Stated Goals Pt reports she wants to get back to doing things more independently like she was before COVID and CVA.    Currently in Pain? No/denies           Rationale for Evaluation and Treatment Rehabilitation  OT TREATMENT    Neuro muscular re-education:  Pt. worked on functional RUE reaching with [facility] skills reaching up to place 1&1/2" pegs onto an elevated vertical [employer]. Pt. worked on sustained UE reach while flipping the pegs. Pt. performed right hand [facility] tasks using the Grooved pegboard. Pt. worked on grasping the grooved pegs from a horizontal position, and moving the pegs to a vertical position in the hand to prepare for placing them in the grooved slot. Pt. worked on removing the pegs while alternating thumb opposition to the tip of her 2nd digit, and thumb. Pt.  worked on grasping turning, and twisting the pegs within her hand.  Therapeutic Exercise:  Pt. performed 1# dowel ex. For UE strengthening secondary to weakness. Bilateral shoulder flexion, chest press, circular patterns, and "V" pattern. 1 set 10-20 reps each.  Pt. continues to make progress and is now able to reach further during tasks at home. Pt. has improved with functional reaching while placing objects into the [employer]. Pt. fatigued more with tasked requiring sustained shoulder elevation while reaching out, and flipping the  1&1/2" pegs. Pt. Required multiple rest breaks between each rep. Pt. Continues to work on improving UE strength, and right functional hand use while sustaining Right shoulder elevation.                            OT Education - 06/16/21 0936     Education Details RUE functioning    Person(s) Educated Patient    Methods Explanation    Comprehension Verbalized understanding;Returned demonstration;Verbal cues required;Need further instruction                 OT Long Term Goals - 05/20/21 1216       OT LONG TERM GOAL #1   Title Patient will be independent with home exercise program    Baseline 03/23/2021: Pt. is independent, 4/5: will continue to upgrade exercises5/03/2021: Independent    Time 12    Period Weeks    Status On-going    Target Date 07/15/21      OT LONG TERM GOAL #2   Title Patient will complete basic self-care tasks with modified independence and without increased time to complete tasks    Baseline Patient has difficulty with washing her back.  Pt. requires increased time to complete self-care tasks.4/5:  Pt still has some difficulty with reaching to wash her back. 05/20/2021: Pt. is independent with basic self-care including washing back.    Time 6    Period Weeks    Status Achieved    Target Date 07/15/21      OT LONG TERM GOAL #3   Title Patient will demonstrate good safety awareness in the kitchen with cooking tasks with modified independence    Baseline Pt. requires Supervision for safety awareness.  4/5 goal upgraded to modified independence 05/20/2021: Pt. is improving with safety with cooking. Pt. reports that she remains in the kitchen the whole time she is cooking.    Time 12    Period Weeks    Status Achieved      OT LONG TERM GOAL #4   Title Patient will increase right grip strength by 10 pounds to be able to open jars and containers with modified independence    Baseline 28 pounds at eval and difficulty with managing jars and  containers. 10th visit: R: 34# Jars,a nd containers continue to be difficulty to open.  4/5: right grip 50# 05/20/2021: right grip strength 40#    Time 12    Period Weeks    Status Revised    Target Date 07/15/21      OT LONG TERM GOAL #6   Title Patient will decrease right shoulder pain to 2 out of 10 or less with movement patterns for daily activities.    Baseline Increased pain in right shoulder, 5 out of 10 with movement. 10th visit: 8/10 right shoulder pain, 4/5:  2/10 with movement today but has increased pain other days and not yet consistent.5/03: 0/10 during movement today.  Time 12    Period Weeks    Status On-going    Target Date 07/15/21      OT LONG TERM GOAL #7   Title Patient will demonstrate score of 59 or greater on FOTO to show a clinically relevant change to impact self-care activities with greater independence    Baseline Score of 55 at eval, 10th visit: FOTO score: 51, increased shoulder  pain limiting overhead activity.  4/5:  score of 64, 5/03/: Foto score 66 with TR score 59    Time 12    Period Weeks    Status Achieved      OT LONG TERM GOAL #8   Title Will assess for potential return to work tasks as patient progresses with therapy    Baseline Patient currently unable to perform her job as outlined in her job description    Time 12    Period Weeks    Status On-going    Target Date 07/15/21      OT LONG TERM GOAL  #9   TITLE Pt will improve strength to pick up a case of water to place into her grocery cart    Baseline 05/20/2021: Pt. continues to have difficulty 4/5: unable    Time 12    Period Weeks    Status New    Target Date 07/15/21      OT LONG TERM GOAL  #10   TITLE When on the passenger side, pt will be able to close door with modified independence    Baseline 4/5:  difficulty at times    Time 6    Period Weeks    Status New    Target Date 06/06/21      OT LONG TERM GOAL  #11   TITLE Pt will demonstrate ability to vacuum carpet of 2 rooms  with use of right UE and no rest breaks    Baseline 4/5:  difficulty with using vacuum. 5/03: Pt. has progressed and is now able to vacuum multiple rooms in her home    Time 6    Period Weeks    Status On-going    Target Date 06/06/21      OT LONG TERM GOAL  #13   TITLE Pt will improve strength to pick up 15# bag of fertilizer for yard work 5/03: Pt. continues to have difficulty    Baseline 4/5: unable    Time 12    Period Weeks    Status On-going                   Plan - 06/16/21 5284     Clinical Impression Statement Pt. continues to make progress and is now able to reach further during tasks at home. Pt. has improved with functional reaching while placing objects into the [employer]. Pt. fatigued more with tasked requiring sustained shoulder elevation while reaching out, and flipping the 1&1/2" pegs. Pt. Required multiple rest breaks between each rep. Pt. Continues to work on improving UE strength, and right functional hand use while sustaining Right shoulder elevation.    OT Occupational Profile and History Problem Focused Assessment - Including review of records relating to presenting problem    Occupational performance deficits (Please refer to evaluation for details): ADL's;IADL's;Social Participation    Body Structure / Function / Physical Skills ADL;Dexterity;ROM;Strength;Coordination;FMC;IADL;Pain;Sensation;UE functional use;Decreased knowledge of use of DME    Cognitive Skills Memory;Safety Awareness    Psychosocial Skills Environmental  Adaptations;Habits;Routines and Behaviors    Rehab  Potential Good    Clinical Decision Making Several treatment options, min-mod task modification necessary    Comorbidities Affecting Occupational Performance: May have comorbidities impacting occupational performance    Modification or Assistance to Complete Evaluation  No modification of tasks or assist necessary to complete eval    OT Frequency 2x / week    OT Duration 12 weeks     OT Treatment/Interventions Self-care/ADL training;Cryotherapy;Paraffin;Therapeutic exercise;DME and/or AE instruction;Cognitive remediation/compensation;Neuromuscular education;Manual Therapy;Moist Heat;Contrast Bath;Therapeutic activities;Patient/family education    Consulted and Agree with Plan of Care Patient             Patient will benefit from skilled therapeutic intervention in order to improve the following deficits and impairments:   Body Structure / Function / Physical Skills: ADL, Dexterity, ROM, Strength, Coordination, FMC, IADL, Pain, Sensation, UE functional use, Decreased knowledge of use of DME Cognitive Skills: Memory, Safety Awareness Psychosocial Skills: Environmental  Adaptations, Habits, Routines and Behaviors   Visit Diagnosis: Muscle weakness (generalized)  Other lack of coordination    Problem List Patient Active Problem List   Diagnosis Date Noted   Acute stroke due to ischemia ([employer]) 08/10/2020   History of total hip replacement, right 09/10/2019   Special screening for malignant neoplasms, colon    Benign neoplasm of ascending colon    Benign neoplasm of descending colon    Harrel Carina, MS, OTR/L  Harrel Carina, OT 06/16/2021, 9:49 AM  [place] [address] [place], [name], [address] Phone: [phone]   Fax:  [phone]  Name: DONALD JACQUE MRN: [id number] Date of Birth: 05/15/1955

## 2021-06-17 ENCOUNTER — Ambulatory Visit: Payer: No Typology Code available for payment source | Admitting: Physical Therapy

## 2021-06-18 ENCOUNTER — Ambulatory Visit: Payer: No Typology Code available for payment source

## 2021-06-18 ENCOUNTER — Ambulatory Visit: Payer: No Typology Code available for payment source | Admitting: Occupational Therapy

## 2021-06-18 ENCOUNTER — Ambulatory Visit: Payer: No Typology Code available for payment source | Admitting: Speech Pathology

## 2021-06-22 ENCOUNTER — Encounter: Payer: Self-pay | Admitting: Occupational Therapy

## 2021-06-22 ENCOUNTER — Encounter: Payer: No Typology Code available for payment source | Admitting: Speech Pathology

## 2021-06-22 ENCOUNTER — Ambulatory Visit: Payer: No Typology Code available for payment source | Admitting: Physical Therapy

## 2021-06-22 ENCOUNTER — Ambulatory Visit: Payer: No Typology Code available for payment source | Attending: Neurology | Admitting: Occupational Therapy

## 2021-06-22 DIAGNOSIS — G8929 Other chronic pain: Secondary | ICD-10-CM | POA: Insufficient documentation

## 2021-06-22 DIAGNOSIS — R41841 Cognitive communication deficit: Secondary | ICD-10-CM | POA: Diagnosis present

## 2021-06-22 DIAGNOSIS — M6281 Muscle weakness (generalized): Secondary | ICD-10-CM | POA: Insufficient documentation

## 2021-06-22 DIAGNOSIS — R4701 Aphasia: Secondary | ICD-10-CM | POA: Insufficient documentation

## 2021-06-22 DIAGNOSIS — M25511 Pain in right shoulder: Secondary | ICD-10-CM | POA: Insufficient documentation

## 2021-06-22 DIAGNOSIS — R29810 Facial weakness: Secondary | ICD-10-CM | POA: Diagnosis present

## 2021-06-22 DIAGNOSIS — M25611 Stiffness of right shoulder, not elsewhere classified: Secondary | ICD-10-CM | POA: Diagnosis present

## 2021-06-22 DIAGNOSIS — R278 Other lack of coordination: Secondary | ICD-10-CM | POA: Diagnosis present

## 2021-06-22 NOTE — Therapy (Signed)
[place] MAIN [facility] SERVICES [address] [place], [name], [address] Phone: [phone]   Fax:  [phone]  Occupational Therapy Treatment  Patient Details  Name: Kim Gomez MRN: [id number] Date of Birth: Mar 05, 1955 Referring Provider (OT): Joselyn Arrow   Encounter Date: 06/22/2021   OT End of Session - 06/22/21 1211     Visit Number 25    Number of Visits 48    Date for OT Re-Evaluation 07/15/21    OT Start Time 3976    OT Stop Time 1230    OT Time Calculation (min) 45 min    Activity Tolerance Patient tolerated treatment well    Behavior During Therapy [facility] for tasks assessed/performed             Past Medical History:  Diagnosis Date   Anxiety    Arthritis    legs, hands   Cellulitis of right knee    started on antibiotics 06/21/15.     Depression    Family history of adverse reaction to anesthesia    sister and mom -PONV   Hypercholesteremia    Hypertension     Past Surgical History:  Procedure Laterality Date   ABDOMINAL HYSTERECTOMY     COLONOSCOPY WITH PROPOFOL N/A 06/27/2015   Procedure: COLONOSCOPY WITH PROPOFOL;  Surgeon: Lucilla Lame, MD;  Location: [place];  Service: Endoscopy;  Laterality: N/A;   POLYPECTOMY  06/27/2015   Procedure: POLYPECTOMY;  Surgeon: Lucilla Lame, MD;  Location: [place];  Service: Endoscopy;;   TOTAL HIP ARTHROPLASTY Right 09/10/2019   Procedure: TOTAL HIP ARTHROPLASTY ANTERIOR APPROACH;  Surgeon: Lovell Sheehan, MD;  Location: [facility];  Service: Orthopedics;  Laterality: Right;    There were no vitals filed for this visit.   Subjective Assessment - 06/22/21 1150     Subjective  Pt reports her cataract sx went very well last week.    Pertinent History Kim Gomez is a 66 y.o. female with PMH significant for HTN, HLD, anxiety disorder, recently hospitalized for COVID-19 infection (08/06/2020) at an outside facility. Patient was brought to the ED on 7/24 after  she had a sudden onset of numbness involving right side of her body without any motor deficits, dysarthria, dysphagia. 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 Per chart for her f/u neuro visit:Left ischemic thalamic/internal capsule infarct presenting with right-sided numbness, weakness - now with residual cognitive fog (improving), imbalance (improving), fatigue, right first + second digit numbness, in patient with known vascular risk factors of hypertension, dyslipidemia, lack of physical activity, snoring    Patient Stated Goals Pt reports she wants to get back to doing things more independently like she was before COVID and CVA.    Currently in Pain? No/denies            Rationale for Evaluation and Treatment Rehabilitation  OT TREATMENT     Neuro muscular re-education:   Pt. worked on functional RUE reaching with [facility] skills reaching up to place 1&1/2" pegs onto an elevated vertical [employer].  Pt. worked on translatory movements, moving the pegs from the tip of her fingers, to her palm, through her hand, and back out to the tip of her 2nd digit, 3rd digit, and thumb. Pt. worked on sustained RUE reach while flipping the pegs.   Therapeutic Exercise:   Pt. performed 1.5# dowel ex. For UE strengthening secondary to weakness. Bilateral shoulder flexion, chest press, circular  patterns, and "V" pattern. 1 set 10-20 reps each.  Pt. reports that she mowed her Mother's yard on Friday using a combination of a riding lawn mower, and a push mower.  Pt. continues to make progress and is now able to reaching further during tasks at home. Pt. continues to use her LUE for reaching to higher shelves. Pt. Presents with numbness in her right thumb, 2nd digit, and 3rd digit. Pt. Continues to improve with functional reaching while placing objects into the [employer]. Pt. fatigued more with tasks requiring sustained shoulder elevation  while reaching out, and flipping the 1&1/2" pegs. Pt. required multiple rest breaks between each rep. Pt. Continues to work on improving UE strength, and right functional hand use while sustaining right shoulder elevation.                       OT Education - 06/22/21 1211     Education Details RUE functioning    Person(s) Educated Patient    Methods Explanation    Comprehension Verbalized understanding;Returned demonstration;Verbal cues required;Need further instruction                 OT Long Term Goals - 05/20/21 1216       OT LONG TERM GOAL #1   Title Patient will be independent with home exercise program    Baseline 03/23/2021: Pt. is independent, 4/5: will continue to upgrade exercises5/03/2021: Independent    Time 12    Period Weeks    Status On-going    Target Date 07/15/21      OT LONG TERM GOAL #2   Title Patient will complete basic self-care tasks with modified independence and without increased time to complete tasks    Baseline Patient has difficulty with washing her back.  Pt. requires increased time to complete self-care tasks.4/5:  Pt still has some difficulty with reaching to wash her back. 05/20/2021: Pt. is independent with basic self-care including washing back.    Time 6    Period Weeks    Status Achieved    Target Date 07/15/21      OT LONG TERM GOAL #3   Title Patient will demonstrate good safety awareness in the kitchen with cooking tasks with modified independence    Baseline Pt. requires Supervision for safety awareness.  4/5 goal upgraded to modified independence 05/20/2021: Pt. is improving with safety with cooking. Pt. reports that she remains in the kitchen the whole time she is cooking.    Time 12    Period Weeks    Status Achieved      OT LONG TERM GOAL #4   Title Patient will increase right grip strength by 10 pounds to be able to open jars and containers with modified independence    Baseline 28 pounds at eval and  difficulty with managing jars and containers. 10th visit: R: 34# Jars,a nd containers continue to be difficulty to open.  4/5: right grip 50# 05/20/2021: right grip strength 40#    Time 12    Period Weeks    Status Revised    Target Date 07/15/21      OT LONG TERM GOAL #6   Title Patient will decrease right shoulder pain to 2 out of 10 or less with movement patterns for daily activities.    Baseline Increased pain in right shoulder, 5 out of 10 with movement. 10th visit: 8/10 right shoulder pain, 4/5:  2/10 with movement today but has increased pain other  days and not yet consistent.5/03: 0/10 during movement today.    Time 12    Period Weeks    Status On-going    Target Date 07/15/21      OT LONG TERM GOAL #7   Title Patient will demonstrate score of 59 or greater on FOTO to show a clinically relevant change to impact self-care activities with greater independence    Baseline Score of 55 at eval, 10th visit: FOTO score: 51, increased shoulder  pain limiting overhead activity.  4/5:  score of 64, 5/03/: Foto score 66 with TR score 59    Time 12    Period Weeks    Status Achieved      OT LONG TERM GOAL #8   Title Will assess for potential return to work tasks as patient progresses with therapy    Baseline Patient currently unable to perform her job as outlined in her job description    Time 12    Period Weeks    Status On-going    Target Date 07/15/21      OT LONG TERM GOAL  #9   TITLE Pt will improve strength to pick up a case of water to place into her grocery cart    Baseline 05/20/2021: Pt. continues to have difficulty 4/5: unable    Time 12    Period Weeks    Status New    Target Date 07/15/21      OT LONG TERM GOAL  #10   TITLE When on the passenger side, pt will be able to close door with modified independence    Baseline 4/5:  difficulty at times    Time 6    Period Weeks    Status New    Target Date 06/06/21      OT LONG TERM GOAL  #11   TITLE Pt will demonstrate  ability to vacuum carpet of 2 rooms with use of right UE and no rest breaks    Baseline 4/5:  difficulty with using vacuum. 5/03: Pt. has progressed and is now able to vacuum multiple rooms in her home    Time 6    Period Weeks    Status On-going    Target Date 06/06/21      OT LONG TERM GOAL  #13   TITLE Pt will improve strength to pick up 15# bag of fertilizer for yard work 5/03: Pt. continues to have difficulty    Baseline 4/5: unable    Time 12    Period Weeks    Status On-going                   Plan - 06/22/21 1212     Clinical Impression Statement Pt. reports that she mowed her Mother's yard on Friday using a combination of a riding lawn mower, and a push mower.  Pt. continues to make progress and is now able to reaching further during tasks at home. Pt. continues to use her LUE for reaching to higher shelves. Pt. Presents with numbness in her right thumb, 2nd digit, and 3rd digit. Pt. Continues to improve with functional reaching while placing objects into the [employer]. Pt. fatigued more with tasks requiring sustained shoulder elevation while reaching out, and flipping the 1&1/2" pegs. Pt. required multiple rest breaks between each rep. Pt. Continues to work on improving UE strength, and right functional hand use while sustaining right shoulder elevation.      OT Occupational Profile and History Problem Focused Assessment -  Including review of records relating to presenting problem    Occupational performance deficits (Please refer to evaluation for details): ADL's;IADL's;Social Participation    Body Structure / Function / Physical Skills ADL;Dexterity;ROM;Strength;Coordination;FMC;IADL;Pain;Sensation;UE functional use;Decreased knowledge of use of DME    Cognitive Skills Memory;Safety Awareness    Psychosocial Skills Environmental  Adaptations;Habits;Routines and Behaviors    Rehab Potential Good    Clinical Decision Making Several treatment options, min-mod task  modification necessary    Comorbidities Affecting Occupational Performance: May have comorbidities impacting occupational performance    Modification or Assistance to Complete Evaluation  No modification of tasks or assist necessary to complete eval    OT Frequency 2x / week    OT Duration 12 weeks    OT Treatment/Interventions Self-care/ADL training;Cryotherapy;Paraffin;Therapeutic exercise;DME and/or AE instruction;Cognitive remediation/compensation;Neuromuscular education;Manual Therapy;Moist Heat;Contrast Bath;Therapeutic activities;Patient/family education    Consulted and Agree with Plan of Care Patient             Patient will benefit from skilled therapeutic intervention in order to improve the following deficits and impairments:   Body Structure / Function / Physical Skills: ADL, Dexterity, ROM, Strength, Coordination, FMC, IADL, Pain, Sensation, UE functional use, Decreased knowledge of use of DME Cognitive Skills: Memory, Safety Awareness Psychosocial Skills: Environmental  Adaptations, Habits, Routines and Behaviors   Visit Diagnosis: Muscle weakness (generalized)  Other lack of coordination    Problem List Patient Active Problem List   Diagnosis Date Noted   Acute stroke due to ischemia ([place]) 08/10/2020   History of total hip replacement, right 09/10/2019   Special screening for malignant neoplasms, colon    Benign neoplasm of ascending colon    Benign neoplasm of descending colon    Harrel Carina, MS, OTR/L   Harrel Carina, OT 06/22/2021, 12:15 PM  [place] [address] [place], [name], [address] Phone: [phone]   Fax:  [phone]  Name: Kim Gomez MRN: [id number] Date of Birth: January 02, 1956

## 2021-06-24 ENCOUNTER — Ambulatory Visit: Payer: No Typology Code available for payment source | Admitting: Occupational Therapy

## 2021-06-24 ENCOUNTER — Ambulatory Visit: Payer: No Typology Code available for payment source | Admitting: Speech Pathology

## 2021-06-24 ENCOUNTER — Encounter: Payer: Self-pay | Admitting: Physical Therapy

## 2021-06-24 ENCOUNTER — Encounter: Payer: Self-pay | Admitting: Occupational Therapy

## 2021-06-24 ENCOUNTER — Ambulatory Visit: Payer: No Typology Code available for payment source | Admitting: Physical Therapy

## 2021-06-24 DIAGNOSIS — R278 Other lack of coordination: Secondary | ICD-10-CM

## 2021-06-24 DIAGNOSIS — M25611 Stiffness of right shoulder, not elsewhere classified: Secondary | ICD-10-CM

## 2021-06-24 DIAGNOSIS — M6281 Muscle weakness (generalized): Secondary | ICD-10-CM

## 2021-06-24 DIAGNOSIS — G8929 Other chronic pain: Secondary | ICD-10-CM

## 2021-06-24 DIAGNOSIS — R41841 Cognitive communication deficit: Secondary | ICD-10-CM

## 2021-06-24 NOTE — Therapy (Signed)
[place] MAIN [facility] SERVICES [address] [place], [name], [address] Phone: [phone]   Fax:  [phone]  Speech Language Pathology Treatment  Patient Details  Name: LYLA JASEK MRN: [id number] Date of Birth: 03-13-1955 Referring Provider (SLP): Dr. Jennings Books   Encounter Date: 06/24/2021   End of Session - 06/24/21 1457     Visit Number 26    Number of Visits 37    Date for SLP Re-Evaluation 08/18/21    Authorization - Visit Number 6    Progress Note Due on Visit 10    SLP Start Time 0900    SLP Stop Time  8144    SLP Time Calculation (min) 55 min    Activity Tolerance Patient tolerated treatment well             Past Medical History:  Diagnosis Date   Anxiety    Arthritis    legs, hands   Cellulitis of right knee    started on antibiotics 06/21/15.     Depression    Family history of adverse reaction to anesthesia    sister and mom -PONV   Hypercholesteremia    Hypertension     Past Surgical History:  Procedure Laterality Date   ABDOMINAL HYSTERECTOMY     COLONOSCOPY WITH PROPOFOL N/A 06/27/2015   Procedure: COLONOSCOPY WITH PROPOFOL;  Surgeon: Lucilla Lame, MD;  Location: [place];  Service: Endoscopy;  Laterality: N/A;   POLYPECTOMY  06/27/2015   Procedure: POLYPECTOMY;  Surgeon: Lucilla Lame, MD;  Location: [place];  Service: Endoscopy;;   TOTAL HIP ARTHROPLASTY Right 09/10/2019   Procedure: TOTAL HIP ARTHROPLASTY ANTERIOR APPROACH;  Surgeon: Lovell Sheehan, MD;  Location: [facility];  Service: Orthopedics;  Laterality: Right;    There were no vitals filed for this visit.   Subjective Assessment - 06/24/21 1450     Subjective "I haven't felt like doing much."    Currently in Pain? No/denies                   ADULT SLP TREATMENT - 06/24/21 1450       General Information   Behavior/Cognition Alert;Cooperative;Pleasant mood    HPI Patient is a 66 y.o. female who admitted  08/10/20 and found to have Covid-19 as well as left ischemic thalamic/internal capsule infarct. Acute SLP screened and s/o; pt now with residual cognitive fog, imbalance, fatigue, right first + second digit numbness.      Cognitive-Linquistic Treatment   Treatment focused on Cognition;Patient/family/caregiver education    Skilled Treatment Patient had cataract surgery and missed several appointments. Reports grieving the loss of her brother and father with anniversary of their deaths this month; she has been feeling down and less motivated to do things. Continued using her planner for appointments but did not keep up with scheduling her priorities (cooking, household tasks, exercise) in recent weeks. Patient reports she has good support from her spouse and family to talk when she is feeling down. SLP focused session on strategies to improve motivation and consistency with planning. Patient added upcoming items (appointments, reminders, recurring tasks and social outings) to June calendar with occasional min cues. To facilitate participation in her virtual Bible Study, patient was given task of reviewing the scripture and preparing her thoughts to discuss with SLP and graduate clinician during next session. Pt also to generate meal plan for the week.      Assessment / Recommendations / Plan   Plan Continue  with current plan of care      Progression Toward Goals   Progression toward goals --   no progress since previous visit due to missing ~1 month of speech appointments (due to surgery, reports grieving for family members), anticipate continued improvements with consistent attendance             SLP Education - 06/24/21 1457     Education Details consistency with strategies, routine to help with motivation    Person(s) Educated Patient    Methods Explanation    Comprehension Verbalized understanding              SLP Short Term Goals - 03/26/21 1247       SLP SHORT TERM GOAL #1    Title Pt will verbalize and demonstrate how to implement 2 memory and attention strategies to aid daily functioning given occasional min A over 2 sessions    Status Achieved      SLP SHORT TERM GOAL #2   Title Pt will manage finances with use of compensations with no reported episodes of missed bills given rare min A    Status Achieved      SLP SHORT TERM GOAL #3   Title Pt will identify errors in writing and self-correct with 80% accuracy given rare min A over 2 sessions    Status Achieved      SLP SHORT TERM GOAL #4   Title Pt will participate in further language assessment as needed (goals to be added)    Status Achieved              SLP Long Term Goals - 05/20/21 1149       SLP LONG TERM GOAL #1   Title Pt will verbalize and implement 4 attention and memory strategies to aid daily functioning for ADLs and IADLs with rare min A over 2 sessions    Time 12    Period Weeks    Status On-going    Target Date 08/18/21      SLP LONG TERM GOAL #2   Title Pt will identify and correct paraphasias in conversation and writing with 90% accuracy given rare min A over 2 sessions    Time 12    Period Weeks    Status Achieved      SLP LONG TERM GOAL #3   Title Pt will use compensations for thought organization effectively in 20 minutes complex conversation.    Baseline mod complex conv achieved 5/3, goal revised    Time 12    Period Weeks    Status Revised    Target Date 08/18/21      SLP LONG TERM GOAL #4   Title Patient will maintain alternating attention between 2 mod complex tasks for 15 minutes with modified independence (strategies allowed).    Time 12    Period Weeks    Status On-going    Target Date 08/18/21      SLP LONG TERM GOAL #5   Title Patient will use strategies and executive function aid to preplan and manage IADLs independently.    Time 12    Period Weeks    Status On-going    Target Date 08/18/21              Plan - 06/24/21 1457     Clinical  Impression Statement Patient presents with mild cognitive communication impairment. Pt gradually recognizing opportunities and initiating use of memory/attention strategies, however was somewhat inconsistent with this over a  one month break in ST (unplanned, due to cataract surgery and pt not feeling well). Patient appears motivated to resume routine and planning activities. Current goals remain appropriate. Skilled ST remains necessary for carryover of compensatory strategies and  to maximize pt's cognitive communication and language skills to improve accuracy/independence with ADLs and IADLs, reduce communication breakdowns, and improve quality of life.    Speech Therapy Frequency 1x /week    Duration 12 weeks    Treatment/Interventions Language facilitation;Environmental controls;SLP instruction and feedback;Cognitive reorganization;Compensatory strategies;Patient/family education;Internal/external aids    Potential to Achieve Goals Good    SLP Home Exercise Plan see pt instructions    Consulted and Agree with Plan of Care Patient             Patient will benefit from skilled therapeutic intervention in order to improve the following deficits and impairments:   Cognitive communication deficit    Problem List Patient Active Problem List   Diagnosis Date Noted   Acute stroke due to ischemia ([place]) 08/10/2020   History of total hip replacement, right 09/10/2019   Special screening for malignant neoplasms, colon    Benign neoplasm of ascending colon    Benign neoplasm of descending colon    Deneise Lever, MS, CCC-SLP Speech-Language Pathologist [phone]   Aliene Altes, [place] 06/24/2021, 3:02 PM  [place] [address] [place], [name], [address] Phone: [phone]   Fax:  [phone]   Name: NYEMAH WATTON MRN: [id number] Date of Birth: [birth year]

## 2021-06-24 NOTE — Therapy (Signed)
[place] MAIN [facility] SERVICES [address] [place], [name], [address] Phone: [phone]   Fax:  [phone]  Physical Therapy Treatment  Patient Details  Name: Kim Gomez MRN: [id number] Date of Birth: 12-05-1955 Referring Provider (PT): Jennings Books MD   Encounter Date: 06/24/2021   PT End of Session - 06/24/21 0801     Visit Number 22    Number of Visits 31    Date for PT Re-Evaluation 08/10/21    Authorization Time Period Recert 6/0/6301-06/19/930    Progress Note Due on Visit 30    PT Start Time 0802    PT Stop Time 0845    PT Time Calculation (min) 43 min             Past Medical History:  Diagnosis Date   Anxiety    Arthritis    legs, hands   Cellulitis of right knee    started on antibiotics 06/21/15.     Depression    Family history of adverse reaction to anesthesia    sister and mom -PONV   Hypercholesteremia    Hypertension     Past Surgical History:  Procedure Laterality Date   ABDOMINAL HYSTERECTOMY     COLONOSCOPY WITH PROPOFOL N/A 06/27/2015   Procedure: COLONOSCOPY WITH PROPOFOL;  Surgeon: Lucilla Lame, MD;  Location: [place];  Service: Endoscopy;  Laterality: N/A;   POLYPECTOMY  06/27/2015   Procedure: POLYPECTOMY;  Surgeon: Lucilla Lame, MD;  Location: [place];  Service: Endoscopy;;   TOTAL HIP ARTHROPLASTY Right 09/10/2019   Procedure: TOTAL HIP ARTHROPLASTY ANTERIOR APPROACH;  Surgeon: Lovell Sheehan, MD;  Location: [facility];  Service: Orthopedics;  Laterality: Right;    There were no vitals filed for this visit.   Subjective Assessment - 06/24/21 0758     Subjective Pt reports some pain in her shoulder upon waking up this morning and she took some NSAID OTC medication which improved the pain.    Patient is accompained by: Family member    Pertinent History Pt had left thamalmic/interanl capsule infarct on 08/10/20 which has reasulted in right sided weakness and reports of  imbalance. Pt reports a fall on october 29th. Pt rpeorts she hurt the R UE with the fall and she reports some stiffness in the knee as well as general weakness on the right side. Pt reports she has had 2 strokes but the first went under the radar and was only evidenced by imaging presented to her by her MD. Pt reports she has been modifying her activities in order to be careful not to fall since the stroke. Reports she has been taking her time with a lot of things. Pt reports she has a membership at [employer] and she has been walking on the treadmill 2-3 times per week, she reports she walks on the treadmill for an hour at a time following working her way up. Pt reports ocassional numbness and tingling in her hands.    Limitations House hold activities    How long can you walk comfortably? 1 hour with UE support    Patient Stated Goals Improve her shoulder pain and function on the right side    Currently in Pain? No/denies   Some pain this AM ( 3/10, took tylenol and does not currently have pain.   Pain Score 0-No pain    Pain Location Shoulder    Pain Orientation Right    Pain Onset More than a month  ago               INTERVENTIONS   Manual: pt supine on plinth as PT provides the following interventions- PROM R shoulder: flexion, abduction, x multiple reps of each with UE held at end range.  -Some anterior deltoid discomfort noted with end range flexion and abduction, did not push past point of pain this date.  AP and PA GH grade 2-3 joint mobilizations x 1-2 min  GH distraction x 10 sec holds, gentle in various degrees of motion  STM to lateral deltoid ( CFM, TrP release) x 3 min      Therex:  Pt transitions to L SL, towel roll added under R arm 2 x 12 shoulder ER with 1 #  -fewer cues this session.   Serratus punch with 3# with shoulder flexion AROM from 90 to 150 holding serratus activation X 10  -cues for correct muscle activation and sequencing   Supine D1 flexion x  10 RTB  -cues for proper muscle activation, good ROM noted  -Pt instructed in how to complete at home  independently with TB   SL R shoulder abduction RTB x 10 -improved ROM throughout, with near full ROM with sets.  pt notes it felt good    Serratus wall slide with UE in pillowcase pressing into ER x 10,  - cues for consistent ER -pt rates difficult but no pain, visible difference in L shoulder ER vs right with this activity.     Pt educated throughout session about proper posture and technique with exercises. Improved exercise technique, movement at target joints, use of target muscles after min to mod verbal, visual, tactile cues.   Note: Portions of this document were prepared using Dragon voice recognition software and although reviewed may contain unintentional dictation errors in syntax, grammar, or spelling.                               PT Education - 06/24/21 0821     Education Details exercise technique    Person(s) Educated Patient    Methods Explanation;Demonstration;Verbal cues    Comprehension Verbalized understanding;Returned demonstration;Need further instruction              PT Short Term Goals - 03/23/21 1656       PT SHORT TERM GOAL #1   Title Patient will be independent in home exercise program to improve strength/mobility for better functional independence with ADLs.    Baseline no HEP at this time    Time 4    Period Weeks    Status Achieved    Target Date 02/17/21      PT SHORT TERM GOAL #2   Title Patient will be independent in progressive shoulder home exercise program in order to improve her shoulder strength and function    Baseline Patient has initial home exercise program and is comfortable with that but it is yet to be progressed for higher level activities    Time 4    Period Weeks    Status New    Target Date 04/20/21               PT Long Term Goals - 05/18/21 0812       PT LONG TERM GOAL #1    Title Patient will increase FOTO score to equal to or greater than  50   to demonstrate statistically significant improvement in mobility and quality of  life.    Baseline 41 on 01/20/21    Time 8    Period Weeks    Status Achieved    Target Date 03/17/21      PT LONG TERM GOAL #2   Title Pt will improve mini BEST test by 4 points or more in order to indicate clinically significant improvent in balance.    Baseline see flowsheets 27 on 2/27    Time 12    Period Weeks    Status Achieved    Target Date 04/14/21      PT LONG TERM GOAL #3   Title Patient  will complete five times sit to stand test in < 15 seconds indicating an increased LE strength and improved balance.    Baseline 13.9 sec on 2/27    Time 12    Period Weeks    Status Achieved    Target Date 04/14/21      PT LONG TERM GOAL #4   Title Patient will increase 10 meter walk test to >1.73ms as to improve gait speed for better community ambulation and to reduce fall risk.    Baseline .86 m/s on 01/20/21 2/27: 1.27 m/s    Time 8    Period Weeks    Status Achieved    Target Date 03/17/21      PT LONG TERM GOAL #5   Title Pty will improve dual task TUG to less than 20 seconds in order to indicate improved cognitive and motor tasks.    Baseline 28.82 on 01/20/21 14.98 sec on 2/27    Time 12    Period Weeks    Status Achieved    Target Date 04/14/21      Additional Long Term Goals   Additional Long Term Goals Yes      PT LONG TERM GOAL #6   Title Patient will improve right shoulder by 1 increment on manual muscle test grading scale in order to indicate improved right upper extremity strength.    Baseline 3-/5 her right shoulder flexion and abduction(due to range of motion restriction) 3+ with external rotation at 0 degrees abduction; 05/18/2021=Access Code: [email or web address] URL: [email or web address]    Time 12    Period Weeks    Status Partially Met    Target Date 08/10/21      PT LONG TERM GOAL #7   Title Patient  will improve QuickDASH score by 12.5 points in order to indicate significantly improved subjective rating of right shoulder function    Baseline 50% on 03/23/2021; 05/18/2021= 23%    Time 8    Period Weeks    Status Achieved    Target Date 05/18/21      PT LONG TERM GOAL #8   Title Patient will improve right shoulder active range of motion by 30 degrees with elevation and by 15 degrees with external rotation in order to indicate improved shoulder function for overhead activities and for self-care activities    Baseline 90 degrees flexion range of motion , 85 degrees abduction range of motion, 25 degrees external rotation range of motion ( all AROM); 05/18/2021=R Shoulder AROM: Flex= 158 deg; ABD= 152; ER= 44    Time 8    Period Weeks    Status Achieved    Target Date 05/18/21      PT LONG TERM GOAL  #9   TITLE Patient will improve right shoulder passive range of motion by 30 degrees of elevation by 10 degrees with external rotation in order to  indicate improved right shoulder range of motion for progressive functional activities    Baseline 120 degrees flexion, 123 degrees abduction, 50 degrees external rotation at 45 degrees abduction on 03/23/2021; 05/18/2021=R Shoulder PROM: Flex= 164 deg; ABD= 160; ER= 52    Time 12    Period Weeks    Status Partially Met    Target Date 08/10/21      PT LONG TERM GOAL  #10   TITLE Patient will improve right shoulder active range of motion by 15 degrees with elevation and by 10 degrees with external rotation in order to indicate improved shoulder function for overhead activities including reaching for 2nd and 3rd shelf in kitchen cabinet and for self-care activities    Baseline 05/18/2021=R Shoulder AROM: Flex= 158 deg; ABD= 152; ER= 44 and Patient reports difficulty reaching overhead onto 2nd and 3rd shelf at home    Time 12    Period Weeks    Status New    Target Date 08/10/21                   Plan - 06/24/21 0802     Clinical Impression  Statement Pt presents with excellent motivation for comletion of PT activities. Pt continues to demonstrate improved ROm and shoulder funciton. Pt did have some pain this AM which is likely secondary to improper form aith bench press like activity ( descried by patient) at the gym yesterday. Pt provided with alternatives to this exercise and modifications to prevent shoulde pain following or due to this type of activity ( wider grip, using maching bench press compared to free weights, and using less weight for higher repetitions). Continued with pain free shoulder strngthening interventions this date with good response from patient. Pt will continue to benefit from skilled PT in otder to ipmrove shoulder function and QOL.    Personal Factors and Comorbidities Age;Comorbidity 1;Comorbidity 2    Comorbidities arthritis, anxiety, HTN, hyperlipedemia.    Examination-Activity Limitations Carry;Lift;Locomotion Level    Examination-Participation Restrictions Cleaning;Yard Work;Meal Prep    Stability/Clinical Decision Making Stable/Uncomplicated    Rehab Potential Good    PT Frequency 1x / week    PT Duration 12 weeks    PT Treatment/Interventions ADLs/Self Care Home Management;Gait training;Therapeutic activities;Functional mobility training;Therapeutic exercise;Balance training;Neuromuscular re-education;Manual techniques;Energy conservation;Passive range of motion;Cryotherapy;Electrical Stimulation;Moist Heat;Patient/family education;Dry needling;Joint Manipulations    PT Next Visit Plan progression of HEP and shoulder strenghtening and ROM exercsies.    PT Home Exercise Plan 05/18/2021: Access Code: [email or web address]  URL: [email or web address]  [email or web address]  URL: [email or web address]; no updates    Consulted and Agree with Plan of Care Patient             Patient will benefit from skilled therapeutic intervention in order to improve the following deficits and impairments:   Decreased activity tolerance, Decreased strength, Decreased range of motion, Impaired UE functional use  Visit Diagnosis: Muscle weakness (generalized)  Chronic right shoulder pain  Stiffness of right shoulder, not elsewhere classified     Problem List Patient Active Problem List   Diagnosis Date Noted   Acute stroke due to ischemia ([place]) 08/10/2020   History of total hip replacement, right 09/10/2019   Special screening for malignant neoplasms, colon    Benign neoplasm of ascending colon    Benign neoplasm of descending colon     Particia Lather, PT 06/24/2021, 8:58 AM  [place], [name], [address] Phone:  [phone]   Fax:  [phone]  Name: Kim Gomez MRN: [id number] Date of Birth: 21-Jan-1955

## 2021-06-24 NOTE — Therapy (Addendum)
[place] MAIN [facility] SERVICES [address] [place], [name], [address] Phone: [phone]   Fax:  [phone]  Occupational Therapy Treatment  Patient Details  Name: Kim Gomez MRN: [id number] Date of Birth: [birth year] Referring Provider (OT): Joselyn Arrow   Encounter Date: 06/24/2021   OT End of Session - 06/24/21 1038     Visit Number 26    Number of Visits 48    Date for OT Re-Evaluation 07/15/21    OT Start Time 1007    OT Stop Time 1052    OT Time Calculation (min) 45 min    Activity Tolerance Patient tolerated treatment well    Behavior During Therapy [facility] for tasks assessed/performed             Past Medical History:  Diagnosis Date   Anxiety    Arthritis    legs, hands   Cellulitis of right knee    started on antibiotics 06/21/15.     Depression    Family history of adverse reaction to anesthesia    sister and mom -PONV   Hypercholesteremia    Hypertension     Past Surgical History:  Procedure Laterality Date   ABDOMINAL HYSTERECTOMY     COLONOSCOPY WITH PROPOFOL N/A 06/27/2015   Procedure: COLONOSCOPY WITH PROPOFOL;  Surgeon: Lucilla Lame, MD;  Location: [place];  Service: Endoscopy;  Laterality: N/A;   POLYPECTOMY  06/27/2015   Procedure: POLYPECTOMY;  Surgeon: Lucilla Lame, MD;  Location: [place];  Service: Endoscopy;;   TOTAL HIP ARTHROPLASTY Right 09/10/2019   Procedure: TOTAL HIP ARTHROPLASTY ANTERIOR APPROACH;  Surgeon: Lovell Sheehan, MD;  Location: [facility];  Service: Orthopedics;  Laterality: Right;    There were no vitals filed for this visit.   Subjective Assessment - 06/24/21 1036     Subjective  Pt reports her cataract sx went very well last week.    Pertinent History Kim Gomez is a 66 y.o. female with PMH significant for HTN, HLD, anxiety disorder, recently hospitalized for COVID-19 infection (08/06/2020) at an outside facility. Patient was brought to the ED on 7/24 after  she had a sudden onset of numbness involving right side of her body without any motor deficits, dysarthria, dysphagia. 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 Per chart for her f/u neuro visit:Left ischemic thalamic/internal capsule infarct presenting with right-sided numbness, weakness - now with residual cognitive fog (improving), imbalance (improving), fatigue, right first + second digit numbness, in patient with known vascular risk factors of hypertension, dyslipidemia, lack of physical activity, snoring    Patient Stated Goals Pt reports she wants to get back to doing things more independently like she was before COVID and CVA.    Currently in Pain? Yes    Pain Score 3     Pain Location Shoulder    Pain Orientation Right    Pain Descriptors / Indicators Aching    Pain Type Chronic pain;Acute pain            OT Treatment  Neuromuscular re-education:  Pt. worked on right hand [facility] skills grasping 1" sticks, 1/4" collars, and 1/4" washers. Pt. worked on storing the objects in the palm, and translatory skills moving the items from the palm of the hand to the tip of the 2nd digit, and thumb. Pt. worked on removing the pegs using bilateral alternating hand patterns.   Therapeutic Ex:  Pt. worked on AROM for  right shoulder flexion, abduction, elbow flexion, extension. Reviewed AAROM shoulder stretch technique at the tabletop for home exercise. Pt. Worked on BUE strengthening, and reciprocal motion using the UBE while seated for 8 min. with no resistance. Constant monitoring was provided.   Manual Therapy:  Pt. tolerated right scapular mobilizations for elevation, depression, and abduction/rotation. Manual therapy was performed following moist heat modality, and independent of there. Ex. And ROM.   Pt. reports 3/10 shoulder pain today prior to therapy. Pt. Presents with tightness through the right scapular region initially, and  responded well to moist heat, manual therapy, and there. Ex. Pt. reports the UBE helped to loosen up her right shoulder. Pt. was able to manipulate  the small sticks, collars, and washers, however presents with intermittent numbness, and tingling through the tip of her thumb, and digits during the task. Pt. Required several rest breaks with weight bearing/proprioceptive input through the right hand. Pt. continues to present with limited RUE strength, and [facility] skills in order to work towards improving, and maximizing independence with ADLs, and IADL tasks.                     OT Education - 06/24/21 1038     Education Details RUE functioning    Person(s) Educated Patient    Methods Explanation    Comprehension Verbalized understanding;Returned demonstration;Verbal cues required;Need further instruction                 OT Long Term Goals - 05/20/21 1216       OT LONG TERM GOAL #1   Title Patient will be independent with home exercise program    Baseline 03/23/2021: Pt. is independent, 4/5: will continue to upgrade exercises5/03/2021: Independent    Time 12    Period Weeks    Status On-going    Target Date 07/15/21      OT LONG TERM GOAL #2   Title Patient will complete basic self-care tasks with modified independence and without increased time to complete tasks    Baseline Patient has difficulty with washing her back.  Pt. requires increased time to complete self-care tasks.4/5:  Pt still has some difficulty with reaching to wash her back. 05/20/2021: Pt. is independent with basic self-care including washing back.    Time 6    Period Weeks    Status Achieved    Target Date 07/15/21      OT LONG TERM GOAL #3   Title Patient will demonstrate good safety awareness in the kitchen with cooking tasks with modified independence    Baseline Pt. requires Supervision for safety awareness.  4/5 goal upgraded to modified independence 05/20/2021: Pt. is improving with safety with  cooking. Pt. reports that she remains in the kitchen the whole time she is cooking.    Time 12    Period Weeks    Status Achieved      OT LONG TERM GOAL #4   Title Patient will increase right grip strength by 10 pounds to be able to open jars and containers with modified independence    Baseline 28 pounds at eval and difficulty with managing jars and containers. 10th visit: R: 34# Jars,a nd containers continue to be difficulty to open.  4/5: right grip 50# 05/20/2021: right grip strength 40#    Time 12    Period Weeks    Status Revised    Target Date 07/15/21      OT LONG TERM GOAL #6   Title Patient  will decrease right shoulder pain to 2 out of 10 or less with movement patterns for daily activities.    Baseline Increased pain in right shoulder, 5 out of 10 with movement. 10th visit: 8/10 right shoulder pain, 4/5:  2/10 with movement today but has increased pain other days and not yet consistent.5/03: 0/10 during movement today.    Time 12    Period Weeks    Status On-going    Target Date 07/15/21      OT LONG TERM GOAL #7   Title Patient will demonstrate score of 59 or greater on FOTO to show a clinically relevant change to impact self-care activities with greater independence    Baseline Score of 55 at eval, 10th visit: FOTO score: 51, increased shoulder  pain limiting overhead activity.  4/5:  score of 64, 5/03/: Foto score 66 with TR score 59    Time 12    Period Weeks    Status Achieved      OT LONG TERM GOAL #8   Title Will assess for potential return to work tasks as patient progresses with therapy    Baseline Patient currently unable to perform her job as outlined in her job description    Time 12    Period Weeks    Status On-going    Target Date 07/15/21      OT LONG TERM GOAL  #9   TITLE Pt will improve strength to pick up a case of water to place into her grocery cart    Baseline 05/20/2021: Pt. continues to have difficulty 4/5: unable    Time 12    Period Weeks     Status New    Target Date 07/15/21      OT LONG TERM GOAL  #10   TITLE When on the passenger side, pt will be able to close door with modified independence    Baseline 4/5:  difficulty at times    Time 6    Period Weeks    Status New    Target Date 06/06/21      OT LONG TERM GOAL  #11   TITLE Pt will demonstrate ability to vacuum carpet of 2 rooms with use of right UE and no rest breaks    Baseline 4/5:  difficulty with using vacuum. 5/03: Pt. has progressed and is now able to vacuum multiple rooms in her home    Time 6    Period Weeks    Status On-going    Target Date 06/06/21      OT LONG TERM GOAL  #13   TITLE Pt will improve strength to pick up 15# bag of fertilizer for yard work 5/03: Pt. continues to have difficulty    Baseline 4/5: unable    Time 12    Period Weeks    Status On-going                   Plan - 06/24/21 1039     Clinical Impression Statement Pt. reports 3/10 shoulder pain today prior to therapy. Pt. Presents with tightness through the right scapular region initially, and responded well to moist heat, manual therapy, and there. Ex. Pt. reports the UBE helped to loosen up her right shoulder. Pt. was able to manipulate  the small sticks, collars, and washers, however presents with intermittent numbness, and tingling through the tip of her thumb, and digits during the task. Pt. Required several rest breaks with weight bearing/proprioceptive input through the right  hand. Pt. continues to present with limited RUE strength, and [facility] skills in order to work towards improving, and maximizing independence with ADLs, and IADL tasks.     OT Occupational Profile and History Problem Focused Assessment - Including review of records relating to presenting problem    Occupational performance deficits (Please refer to evaluation for details): ADL's;IADL's;Social Participation    Body Structure / Function / Physical Skills  ADL;Dexterity;ROM;Strength;Coordination;FMC;IADL;Pain;Sensation;UE functional use;Decreased knowledge of use of DME    Cognitive Skills Memory;Safety Awareness    Psychosocial Skills Environmental  Adaptations;Habits;Routines and Behaviors    Rehab Potential Good    Clinical Decision Making Several treatment options, min-mod task modification necessary    Comorbidities Affecting Occupational Performance: May have comorbidities impacting occupational performance    Modification or Assistance to Complete Evaluation  No modification of tasks or assist necessary to complete eval    OT Frequency 2x / week    OT Duration 12 weeks    OT Treatment/Interventions Self-care/ADL training;Cryotherapy;Paraffin;Therapeutic exercise;DME and/or AE instruction;Cognitive remediation/compensation;Neuromuscular education;Manual Therapy;Moist Heat;Contrast Bath;Therapeutic activities;Patient/family education    Consulted and Agree with Plan of Care Patient             Patient will benefit from skilled therapeutic intervention in order to improve the following deficits and impairments:   Body Structure / Function / Physical Skills: ADL, Dexterity, ROM, Strength, Coordination, FMC, IADL, Pain, Sensation, UE functional use, Decreased knowledge of use of DME Cognitive Skills: Memory, Safety Awareness Psychosocial Skills: Environmental  Adaptations, Habits, Routines and Behaviors   Visit Diagnosis: Muscle weakness (generalized)  Other lack of coordination    Problem List Patient Active Problem List   Diagnosis Date Noted   Acute stroke due to ischemia ([place]) 08/10/2020   History of total hip replacement, right 09/10/2019   Special screening for malignant neoplasms, colon    Benign neoplasm of ascending colon    Benign neoplasm of descending colon    Harrel Carina, MS, OTR/L   Harrel Carina, OT 06/24/2021, 11:12 AM  [place] [address] [place], [name], [address] Phone: [phone]   Fax:  [phone]  Name: Kim Gomez MRN: [id number] Date of Birth: 06-13-1955

## 2021-06-25 ENCOUNTER — Ambulatory Visit (INDEPENDENT_AMBULATORY_CARE_PROVIDER_SITE_OTHER): Payer: No Typology Code available for payment source

## 2021-06-25 DIAGNOSIS — I639 Cerebral infarction, unspecified: Secondary | ICD-10-CM

## 2021-06-29 ENCOUNTER — Ambulatory Visit: Payer: No Typology Code available for payment source | Admitting: Occupational Therapy

## 2021-06-29 ENCOUNTER — Ambulatory Visit: Payer: No Typology Code available for payment source | Admitting: Physical Therapy

## 2021-06-29 ENCOUNTER — Encounter: Payer: No Typology Code available for payment source | Admitting: Speech Pathology

## 2021-06-29 LAB — CUP PACEART REMOTE DEVICE CHECK
Date Time Interrogation Session: 20230612074223
Implantable Pulse Generator Implant Date: 20230405
Pulse Gen Serial Number: 178997

## 2021-06-30 ENCOUNTER — Ambulatory Visit: Payer: No Typology Code available for payment source | Admitting: Occupational Therapy

## 2021-06-30 DIAGNOSIS — M25611 Stiffness of right shoulder, not elsewhere classified: Secondary | ICD-10-CM

## 2021-06-30 DIAGNOSIS — R278 Other lack of coordination: Secondary | ICD-10-CM

## 2021-06-30 DIAGNOSIS — M6281 Muscle weakness (generalized): Secondary | ICD-10-CM | POA: Diagnosis not present

## 2021-06-30 NOTE — Therapy (Signed)
[place] MAIN [facility] SERVICES [address] [place], [name], [address] Phone: [phone]   Fax:  [phone]  Occupational Therapy Treatment  Patient Details  Name: Kim Gomez MRN: [id number] Date of Birth: May 01, 1955 Referring Provider (OT): Joselyn Arrow   Encounter Date: 06/30/2021   OT End of Session - 06/30/21 0834     Visit Number 27    Number of Visits 65    Date for OT Re-Evaluation 07/15/21    OT Start Time 0830    OT Stop Time 0915    OT Time Calculation (min) 45 min    Activity Tolerance Patient tolerated treatment well    Behavior During Therapy [facility] for tasks assessed/performed             Past Medical History:  Diagnosis Date   Anxiety    Arthritis    legs, hands   Cellulitis of right knee    started on antibiotics 06/21/15.     Depression    Family history of adverse reaction to anesthesia    sister and mom -PONV   Hypercholesteremia    Hypertension     Past Surgical History:  Procedure Laterality Date   ABDOMINAL HYSTERECTOMY     COLONOSCOPY WITH PROPOFOL N/A 06/27/2015   Procedure: COLONOSCOPY WITH PROPOFOL;  Surgeon: Lucilla Lame, MD;  Location: [place];  Service: Endoscopy;  Laterality: N/A;   POLYPECTOMY  06/27/2015   Procedure: POLYPECTOMY;  Surgeon: Lucilla Lame, MD;  Location: [facility];  Service: Endoscopy;;   TOTAL HIP ARTHROPLASTY Right 09/10/2019   Procedure: TOTAL HIP ARTHROPLASTY ANTERIOR APPROACH;  Surgeon: Lovell Sheehan, MD;  Location: [facility];  Service: Orthopedics;  Laterality: Right;    There were no vitals filed for this visit.   Subjective Assessment - 06/30/21 0830     Subjective  Pt reports no pain this weekend    Pertinent History Kim Gomez is a 66 y.o. female with PMH significant for HTN, HLD, anxiety disorder, recently hospitalized for COVID-19 infection (08/06/2020) at an outside facility. Patient was brought to the ED on 7/24 after she had a sudden  onset of numbness involving right side of her body without any motor deficits, dysarthria, dysphagia. 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 Per chart for her f/u neuro visit:Left ischemic thalamic/internal capsule infarct presenting with right-sided numbness, weakness - now with residual cognitive fog (improving), imbalance (improving), fatigue, right first + second digit numbness, in patient with known vascular risk factors of hypertension, dyslipidemia, lack of physical activity, snoring    Patient Stated Goals Pt reports she wants to get back to doing things more independently like she was before COVID and CVA.    Currently in Pain? No/denies    Pain Onset More than a month ago              Therapeutic Exercise  Upgraded HEP - blue theraband and green theraputty provided. Pt demonstrated good technique for gross grip, lateral pinch, lumbrical pinch, pinch and pull, 3 pt pinch, and tip to tip pinch exercises. Recommend continuing to use red theraputty for digit extension. Tolerated increased weight of 2# dowel exercises for UE strengthening: bilateral shoulder flexion, chest press, circular patterns, and elbow flexion/extension with cues for rest break. No weight, scapular retractions    Neuromuscular Re-education Pt performed [facility] tasks using the Grooved pegboard. Pt worked on grasping the grooved pegs from a horizontal position and worked  on translatory skills, moving the pegs to a vertical position in the hand to prepare for placing them in the grooved slot. Pt removed pegs using tweezers. 2nd trial replaced pegs with tweezers.          OT Education - 06/30/21 0834     Education Details RUE functioning    Person(s) Educated Patient    Methods Explanation    Comprehension Verbalized understanding;Returned demonstration;Verbal cues required;Need further instruction                 OT Long Term Goals -  05/20/21 1216       OT LONG TERM GOAL #1   Title Patient will be independent with home exercise program    Baseline 03/23/2021: Pt. is independent, 4/5: will continue to upgrade exercises5/03/2021: Independent    Time 12    Period Weeks    Status On-going    Target Date 07/15/21      OT LONG TERM GOAL #2   Title Patient will complete basic self-care tasks with modified independence and without increased time to complete tasks    Baseline Patient has difficulty with washing her back.  Pt. requires increased time to complete self-care tasks.4/5:  Pt still has some difficulty with reaching to wash her back. 05/20/2021: Pt. is independent with basic self-care including washing back.    Time 6    Period Weeks    Status Achieved    Target Date 07/15/21      OT LONG TERM GOAL #3   Title Patient will demonstrate good safety awareness in the kitchen with cooking tasks with modified independence    Baseline Pt. requires Supervision for safety awareness.  4/5 goal upgraded to modified independence 05/20/2021: Pt. is improving with safety with cooking. Pt. reports that she remains in the kitchen the whole time she is cooking.    Time 12    Period Weeks    Status Achieved      OT LONG TERM GOAL #4   Title Patient will increase right grip strength by 10 pounds to be able to open jars and containers with modified independence    Baseline 28 pounds at eval and difficulty with managing jars and containers. 10th visit: R: 34# Jars,a nd containers continue to be difficulty to open.  4/5: right grip 50# 05/20/2021: right grip strength 40#    Time 12    Period Weeks    Status Revised    Target Date 07/15/21      OT LONG TERM GOAL #6   Title Patient will decrease right shoulder pain to 2 out of 10 or less with movement patterns for daily activities.    Baseline Increased pain in right shoulder, 5 out of 10 with movement. 10th visit: 8/10 right shoulder pain, 4/5:  2/10 with movement today but has increased  pain other days and not yet consistent.5/03: 0/10 during movement today.    Time 12    Period Weeks    Status On-going    Target Date 07/15/21      OT LONG TERM GOAL #7   Title Patient will demonstrate score of 59 or greater on FOTO to show a clinically relevant change to impact self-care activities with greater independence    Baseline Score of 55 at eval, 10th visit: FOTO score: 51, increased shoulder  pain limiting overhead activity.  4/5:  score of 64, 5/03/: Foto score 66 with TR score 59    Time 12    Period  Weeks    Status Achieved      OT LONG TERM GOAL #8   Title Will assess for potential return to work tasks as patient progresses with therapy    Baseline Patient currently unable to perform her job as outlined in her job description    Time 12    Period Weeks    Status On-going    Target Date 07/15/21      OT LONG TERM GOAL  #9   TITLE Pt will improve strength to pick up a case of water to place into her grocery cart    Baseline 05/20/2021: Pt. continues to have difficulty 4/5: unable    Time 12    Period Weeks    Status New    Target Date 07/15/21      OT LONG TERM GOAL  #10   TITLE When on the passenger side, pt will be able to close door with modified independence    Baseline 4/5:  difficulty at times    Time 6    Period Weeks    Status New    Target Date 06/06/21      OT LONG TERM GOAL  #11   TITLE Pt will demonstrate ability to vacuum carpet of 2 rooms with use of right UE and no rest breaks    Baseline 4/5:  difficulty with using vacuum. 5/03: Pt. has progressed and is now able to vacuum multiple rooms in her home    Time 6    Period Weeks    Status On-going    Target Date 06/06/21      OT LONG TERM GOAL  #13   TITLE Pt will improve strength to pick up 15# bag of fertilizer for yard work 5/03: Pt. continues to have difficulty    Baseline 4/5: unable    Time 12    Period Weeks    Status On-going                   Plan - 06/30/21 0834      Clinical Impression Statement Pt tolerated increased weight using 2# dowel for therex. Updated HEP - green theraputty and blue theraband provided and exercises reviewed. Increased time to complete grooved pegboard task using tweezers, requires medium sized tweezers to complete. Pt. continues to present with limited RUE strength, and [facility] skills in order to work towards improving, and maximizing independence with ADLs, and IADL tasks.    OT Occupational Profile and History Problem Focused Assessment - Including review of records relating to presenting problem    Occupational performance deficits (Please refer to evaluation for details): ADL's;IADL's;Social Participation    Body Structure / Function / Physical Skills ADL;Dexterity;ROM;Strength;Coordination;FMC;IADL;Pain;Sensation;UE functional use;Decreased knowledge of use of DME    Cognitive Skills Memory;Safety Awareness    Psychosocial Skills Environmental  Adaptations;Habits;Routines and Behaviors    Rehab Potential Good    Clinical Decision Making Several treatment options, min-mod task modification necessary    Comorbidities Affecting Occupational Performance: May have comorbidities impacting occupational performance    Modification or Assistance to Complete Evaluation  No modification of tasks or assist necessary to complete eval    OT Frequency 2x / week    OT Duration 12 weeks    OT Treatment/Interventions Self-care/ADL training;Cryotherapy;Paraffin;Therapeutic exercise;DME and/or AE instruction;Cognitive remediation/compensation;Neuromuscular education;Manual Therapy;Moist Heat;Contrast Bath;Therapeutic activities;Patient/family education    Consulted and Agree with Plan of Care Patient             Patient will benefit from skilled  therapeutic intervention in order to improve the following deficits and impairments:   Body Structure / Function / Physical Skills: ADL, Dexterity, ROM, Strength, Coordination, FMC, IADL, Pain, Sensation, UE  functional use, Decreased knowledge of use of DME Cognitive Skills: Memory, Safety Awareness Psychosocial Skills: Environmental  Adaptations, Habits, Routines and Behaviors   Visit Diagnosis: Other lack of coordination  Muscle weakness (generalized)  Stiffness of right shoulder, not elsewhere classified    Problem List Patient Active Problem List   Diagnosis Date Noted   Acute stroke due to ischemia ([place]) 08/10/2020   History of total hip replacement, right 09/10/2019   Special screening for malignant neoplasms, colon    Benign neoplasm of ascending colon    Benign neoplasm of descending colon     Dessie Coma, M.S. OTR/L  06/30/21, 9:05 AM  ascom [phone]   [place] [address] [place], [name], [address] Phone: [phone]   Fax:  [phone]  Name: Kim Gomez MRN: [id number] Date of Birth: [birth year]

## 2021-07-01 ENCOUNTER — Ambulatory Visit: Payer: No Typology Code available for payment source | Admitting: Speech Pathology

## 2021-07-01 ENCOUNTER — Encounter: Payer: Self-pay | Admitting: Occupational Therapy

## 2021-07-01 ENCOUNTER — Encounter: Payer: Self-pay | Admitting: Physical Therapy

## 2021-07-01 ENCOUNTER — Ambulatory Visit: Payer: No Typology Code available for payment source | Admitting: Occupational Therapy

## 2021-07-01 ENCOUNTER — Ambulatory Visit: Payer: No Typology Code available for payment source | Admitting: Physical Therapy

## 2021-07-01 DIAGNOSIS — R278 Other lack of coordination: Secondary | ICD-10-CM

## 2021-07-01 DIAGNOSIS — M6281 Muscle weakness (generalized): Secondary | ICD-10-CM

## 2021-07-01 DIAGNOSIS — M25611 Stiffness of right shoulder, not elsewhere classified: Secondary | ICD-10-CM

## 2021-07-01 DIAGNOSIS — R41841 Cognitive communication deficit: Secondary | ICD-10-CM

## 2021-07-01 DIAGNOSIS — G8929 Other chronic pain: Secondary | ICD-10-CM

## 2021-07-01 NOTE — Therapy (Signed)
[place] MAIN [facility] SERVICES [address] [place], [name], [address] Phone: [phone]   Fax:  [phone]  Physical Therapy Treatment  Patient Details  Name: Kim Gomez MRN: [id number] Date of Birth: 1955-04-04 Referring Provider (PT): Jennings Books MD   Encounter Date: 07/01/2021   PT End of Session - 07/01/21 0852     Visit Number 23    Number of Visits 31    Date for PT Re-Evaluation 08/10/21    Authorization Time Period Recert 07/23/4490-0/10/710    Progress Note Due on Visit 30    PT Start Time 0802    PT Stop Time 0843    PT Time Calculation (min) 41 min    Activity Tolerance Patient tolerated treatment well             Past Medical History:  Diagnosis Date   Anxiety    Arthritis    legs, hands   Cellulitis of right knee    started on antibiotics 06/21/15.     Depression    Family history of adverse reaction to anesthesia    sister and mom -PONV   Hypercholesteremia    Hypertension     Past Surgical History:  Procedure Laterality Date   ABDOMINAL HYSTERECTOMY     COLONOSCOPY WITH PROPOFOL N/A 06/27/2015   Procedure: COLONOSCOPY WITH PROPOFOL;  Surgeon: Lucilla Lame, MD;  Location: [place];  Service: Endoscopy;  Laterality: N/A;   POLYPECTOMY  06/27/2015   Procedure: POLYPECTOMY;  Surgeon: Lucilla Lame, MD;  Location: [facility];  Service: Endoscopy;;   TOTAL HIP ARTHROPLASTY Right 09/10/2019   Procedure: TOTAL HIP ARTHROPLASTY ANTERIOR APPROACH;  Surgeon: Lovell Sheehan, MD;  Location: [facility];  Service: Orthopedics;  Laterality: Right;    There were no vitals filed for this visit.   Subjective Assessment - 07/01/21 0759     Subjective Pt reports no pain this AM and no reports of pain since her previous session.    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 Pt reports a fall on october 29th. Pt rpeorts she hurt the R UE with the fall and she reports some stiffness in the knee as well as general weakness on the right side. Pt reports she has had 2 strokes but the first went under the radar and was only evidenced by imaging presented to her by her MD. Pt reports she has been modifying her activities in order to be careful not to fall since the stroke. Reports she has been taking her time with a lot of things. Pt reports she has a membership at [employer] and she has been walking on the treadmill 2-3 times per week, she reports she walks on the treadmill for an hour at a time following working her way up. Pt reports ocassional numbness and tingling in her hands.    Limitations House hold activities    How long can you walk comfortably? 1 hour with UE support    Patient Stated Goals Improve her shoulder pain and function on the right side    Pain Onset More than a month ago              INTERVENTIONS   Manual: pt supine on plinth as PT provides the following interventions- PROM R shoulder: flexion, abduction, x multiple reps of each with UE  held at end range.  -Some anterior deltoid discomfort noted with end range flexion and abduction, did not push past point of pain this date.  AP and PA GH grade 2-3 joint mobilizations x 1-2 min  GH distraction x 10 sec holds, gentle in various degrees of motion  STM to profimal biceps long head ( CFM, TrP release) x 3 min      Therex:  Pt transitions to L SL, towel roll added under R arm 2 x 12 shoulder ER with 1 #  -fewer cues this session.   Serratus punch with 8# weighted bar ( holding bilaterally) with shoulder flexion AROM from 90 to 150 holding serratus activation X 10  -minimal cues required this date for correct muscle activation.   Supine D1 flexion 2 x 10 GTB  -cues for proper muscle activation, good ROM noted  -Pt instructed in how to complete at home  independently with  TB   SL R shoulder abduction GTB x 10 -improved ROM throughout, with near full ROM with sets.  pt notes it felt good    Wall walk (low v to high v) X 1 reps YTB  - cues for consistent ER ( VC and tactile cues)  -pt rates difficult but no pain, visible difference in L shoulder ER vs right with this activity.     Pt educated throughout session about proper posture and technique with exercises. Improved exercise technique, movement at target joints, use of target muscles after min to mod verbal, visual, tactile cues.   Note: Portions of this document were prepared using Dragon voice recognition software and although reviewed may contain unintentional dictation errors in syntax, grammar, or spelling.  Rationale for Evaluation and Treatment Rehabilitation                            PT Education - 07/01/21 1022     Education Details Exercise technique    Person(s) Educated Patient    Methods Explanation;Demonstration;Tactile cues    Comprehension Verbalized understanding;Returned demonstration              PT Short Term Goals - 03/23/21 1656       PT SHORT TERM GOAL #1   Title Patient will be independent in home exercise program to improve strength/mobility for better functional independence with ADLs.    Baseline no HEP at this time    Time 4    Period Weeks    Status Achieved    Target Date 02/17/21      PT SHORT TERM GOAL #2   Title Patient will be independent in progressive shoulder home exercise program in order to improve her shoulder strength and function    Baseline Patient has initial home exercise program and is comfortable with that but it is yet to be progressed for higher level activities    Time 4    Period Weeks    Status New    Target Date 04/20/21               PT Long Term Goals - 05/18/21 0812       PT LONG TERM GOAL #1   Title Patient will increase FOTO score to equal to or greater than  50   to demonstrate  statistically significant improvement in mobility and quality of life.    Baseline 41 on 01/20/21    Time 8    Period Weeks    Status Achieved  Target Date 03/17/21      PT LONG TERM GOAL #2   Title Pt will improve mini BEST test by 4 points or more in order to indicate clinically significant improvent in balance.    Baseline see flowsheets 27 on 2/27    Time 12    Period Weeks    Status Achieved    Target Date 04/14/21      PT LONG TERM GOAL #3   Title Patient  will complete five times sit to stand test in < 15 seconds indicating an increased LE strength and improved balance.    Baseline 13.9 sec on 2/27    Time 12    Period Weeks    Status Achieved    Target Date 04/14/21      PT LONG TERM GOAL #4   Title Patient will increase 10 meter walk test to >1.30ms as to improve gait speed for better community ambulation and to reduce fall risk.    Baseline .86 m/s on 01/20/21 2/27: 1.27 m/s    Time 8    Period Weeks    Status Achieved    Target Date 03/17/21      PT LONG TERM GOAL #5   Title Pty will improve dual task TUG to less than 20 seconds in order to indicate improved cognitive and motor tasks.    Baseline 28.82 on 01/20/21 14.98 sec on 2/27    Time 12    Period Weeks    Status Achieved    Target Date 04/14/21      Additional Long Term Goals   Additional Long Term Goals Yes      PT LONG TERM GOAL #6   Title Patient will improve right shoulder by 1 increment on manual muscle test grading scale in order to indicate improved right upper extremity strength.    Baseline 3-/5 her right shoulder flexion and abduction(due to range of motion restriction) 3+ with external rotation at 0 degrees abduction; 05/18/2021=Access Code: [email or web address] URL: [email or web address]    Time 12    Period Weeks    Status Partially Met    Target Date 08/10/21      PT LONG TERM GOAL #7   Title Patient will improve QuickDASH score by 12.5 points in order to indicate significantly improved  subjective rating of right shoulder function    Baseline 50% on 03/23/2021; 05/18/2021= 23%    Time 8    Period Weeks    Status Achieved    Target Date 05/18/21      PT LONG TERM GOAL #8   Title Patient will improve right shoulder active range of motion by 30 degrees with elevation and by 15 degrees with external rotation in order to indicate improved shoulder function for overhead activities and for self-care activities    Baseline 90 degrees flexion range of motion , 85 degrees abduction range of motion, 25 degrees external rotation range of motion ( all AROM); 05/18/2021=R Shoulder AROM: Flex= 158 deg; ABD= 152; ER= 44    Time 8    Period Weeks    Status Achieved    Target Date 05/18/21      PT LONG TERM GOAL  #9   TITLE Patient will improve right shoulder passive range of motion by 30 degrees of elevation by 10 degrees with external rotation in order to indicate improved right shoulder range of motion for progressive functional activities    Baseline 120 degrees flexion, 123 degrees abduction, 50 degrees external rotation at  45 degrees abduction on 03/23/2021; 05/18/2021=R Shoulder PROM: Flex= 164 deg; ABD= 160; ER= 52    Time 12    Period Weeks    Status Partially Met    Target Date 08/10/21      PT LONG TERM GOAL  #10   TITLE Patient will improve right shoulder active range of motion by 15 degrees with elevation and by 10 degrees with external rotation in order to indicate improved shoulder function for overhead activities including reaching for 2nd and 3rd shelf in kitchen cabinet and for self-care activities    Baseline 05/18/2021=R Shoulder AROM: Flex= 158 deg; ABD= 152; ER= 44 and Patient reports difficulty reaching overhead onto 2nd and 3rd shelf at home    Time 12    Period Weeks    Status New    Target Date 08/10/21                   Plan - 07/01/21 0805     Clinical Impression Statement Pt presents with excellent motivation for comletion of PT activities. Pt continues to  demonstrate normal AROM of the right shoulder. Pt has continued difficulty with active shoulder ER with elevation exercises on wall. Will continue to strengthen ER in the shulder for progression for these. Continued with pain free shoulder strngthening interventions this date with good response from patient. Pt will continue to benefit from skilled PT in otder to ipmrove shoulder function and QOL.    Personal Factors and Comorbidities Age;Comorbidity 1;Comorbidity 2    Comorbidities arthritis, anxiety, HTN, hyperlipedemia.    Examination-Activity Limitations Carry;Lift;Locomotion Level    Examination-Participation Restrictions Cleaning;Yard Work;Meal Prep    Stability/Clinical Decision Making Stable/Uncomplicated    Rehab Potential Good    PT Frequency 1x / week    PT Duration 12 weeks    PT Treatment/Interventions ADLs/Self Care Home Management;Gait training;Therapeutic activities;Functional mobility training;Therapeutic exercise;Balance training;Neuromuscular re-education;Manual techniques;Energy conservation;Passive range of motion;Cryotherapy;Electrical Stimulation;Moist Heat;Patient/family education;Dry needling;Joint Manipulations    PT Next Visit Plan progression of HEP and shoulder strenghtening and ROM exercsies.    PT Home Exercise Plan 05/18/2021: Access Code: [email or web address]  URL: [email or web address]  Access Code: [email or web address]  URL: [email or web address]; no updates    Consulted and Agree with Plan of Care Patient             Patient will benefit from skilled therapeutic intervention in order to improve the following deficits and impairments:  Decreased activity tolerance, Decreased strength, Decreased range of motion, Impaired UE functional use  Visit Diagnosis: Chronic right shoulder pain  Stiffness of right shoulder, not elsewhere classified  Muscle weakness (generalized)     Problem List Patient Active Problem List   Diagnosis Date Noted   Acute  stroke due to ischemia ([place]) 08/10/2020   History of total hip replacement, right 09/10/2019   Special screening for malignant neoplasms, colon    Benign neoplasm of ascending colon    Benign neoplasm of descending colon     Particia Lather, PT 07/01/2021, 10:25 [place] [address] [place], [name], [address] Phone: [phone]   Fax:  [phone]  Name: REYA AURICH MRN: [id number] Date of Birth: [birth year]

## 2021-07-01 NOTE — Patient Instructions (Signed)
Bible study preparation  BEFORE going to your Bible Study:  Print out the material they send you. Read over the material. Write down a list of questions that you have about the material. Write down anything else you think of that you might want to share with the group.    Scripture __________________________________  Questions         Other thoughts/things to share

## 2021-07-01 NOTE — Therapy (Signed)
[facility] [address] [place], [name], [address] Phone: [phone]   Fax:  [phone]  Occupational Therapy Treatment  Patient Details  Name: COVA KNIERIEM MRN: [id number] Date of Birth: Jun 26, 1955 Referring Provider (OT): Joselyn Arrow   Encounter Date: 07/01/2021   OT End of Session - 07/01/21 0958     Visit Number 28    Number of Visits 48    Date for OT Re-Evaluation 07/15/21    OT Start Time 1000    OT Stop Time 1045    OT Time Calculation (min) 45 min    Activity Tolerance Patient tolerated treatment well    Behavior During Therapy [facility] for tasks assessed/performed             Past Medical History:  Diagnosis Date   Anxiety    Arthritis    legs, hands   Cellulitis of right knee    started on antibiotics 06/21/15.     Depression    Family history of adverse reaction to anesthesia    sister and mom -PONV   Hypercholesteremia    Hypertension     Past Surgical History:  Procedure Laterality Date   ABDOMINAL HYSTERECTOMY     COLONOSCOPY WITH PROPOFOL N/A 06/27/2015   Procedure: COLONOSCOPY WITH PROPOFOL;  Surgeon: Lucilla Lame, MD;  Location: [place];  Service: Endoscopy;  Laterality: N/A;   POLYPECTOMY  06/27/2015   Procedure: POLYPECTOMY;  Surgeon: Lucilla Lame, MD;  Location: [facility];  Service: Endoscopy;;   TOTAL HIP ARTHROPLASTY Right 09/10/2019   Procedure: TOTAL HIP ARTHROPLASTY ANTERIOR APPROACH;  Surgeon: Lovell Sheehan, MD;  Location: [facility];  Service: Orthopedics;  Laterality: Right;    There were no vitals filed for this visit.   Subjective Assessment - 07/01/21 0957     Subjective  Pt reports she is tired from all her therapy today    Pertinent History LAISHA RAU is a 66 y.o. female with PMH significant for HTN, HLD, anxiety disorder, recently hospitalized for COVID-19 infection (08/06/2020) at an outside facility. Patient was brought to the ED on 7/24 after  she had a sudden onset of numbness involving right side of her body without any motor deficits, dysarthria, dysphagia. 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 Per chart for her f/u neuro visit:Left ischemic thalamic/internal capsule infarct presenting with right-sided numbness, weakness - now with residual cognitive fog (improving), imbalance (improving), fatigue, right first + second digit numbness, in patient with known vascular risk factors of hypertension, dyslipidemia, lack of physical activity, snoring    Patient Stated Goals Pt reports she wants to get back to doing things more independently like she was before COVID and CVA.    Pain Onset More than a month ago             Therapeutic Exercise Pt performed 2# dowel ex for BUE strengthening. 1 set x 20 reps: scapular retraction/protraction, overhead press, chest press (increased pain, limited to 5 reps), circular patterns, and elbow flexion/extension were performed. Supination/pronation using 1# dumbbell.    Neuromuscular Re-education Pt worked on [facility] skills using [employer]  pt placed 1/4" dowel with collar and washer into pegboard. Difficulty noted maintaining grasp on washer while performing translatory movements moving items from palm to fingertips. Cues to avoid hiking R shoulder and maintain midline while reaching RUE.         OT Education - 07/01/21 8250  Education Details HEP    Person(s) Educated Patient    Methods Explanation    Comprehension Verbalized understanding;Returned demonstration;Verbal cues required;Need further instruction                 OT Long Term Goals - 05/20/21 1216       OT LONG TERM GOAL #1   Title Patient will be independent with home exercise program    Baseline 03/23/2021: Pt. is independent, 4/5: will continue to upgrade exercises5/03/2021: Independent    Time 12    Period Weeks    Status On-going    Target  Date 07/15/21      OT LONG TERM GOAL #2   Title Patient will complete basic self-care tasks with modified independence and without increased time to complete tasks    Baseline Patient has difficulty with washing her back.  Pt. requires increased time to complete self-care tasks.4/5:  Pt still has some difficulty with reaching to wash her back. 05/20/2021: Pt. is independent with basic self-care including washing back.    Time 6    Period Weeks    Status Achieved    Target Date 07/15/21      OT LONG TERM GOAL #3   Title Patient will demonstrate good safety awareness in the kitchen with cooking tasks with modified independence    Baseline Pt. requires Supervision for safety awareness.  4/5 goal upgraded to modified independence 05/20/2021: Pt. is improving with safety with cooking. Pt. reports that she remains in the kitchen the whole time she is cooking.    Time 12    Period Weeks    Status Achieved      OT LONG TERM GOAL #4   Title Patient will increase right grip strength by 10 pounds to be able to open jars and containers with modified independence    Baseline 28 pounds at eval and difficulty with managing jars and containers. 10th visit: R: 34# Jars,a nd containers continue to be difficulty to open.  4/5: right grip 50# 05/20/2021: right grip strength 40#    Time 12    Period Weeks    Status Revised    Target Date 07/15/21      OT LONG TERM GOAL #6   Title Patient will decrease right shoulder pain to 2 out of 10 or less with movement patterns for daily activities.    Baseline Increased pain in right shoulder, 5 out of 10 with movement. 10th visit: 8/10 right shoulder pain, 4/5:  2/10 with movement today but has increased pain other days and not yet consistent.5/03: 0/10 during movement today.    Time 12    Period Weeks    Status On-going    Target Date 07/15/21      OT LONG TERM GOAL #7   Title Patient will demonstrate score of 59 or greater on FOTO to show a clinically relevant  change to impact self-care activities with greater independence    Baseline Score of 55 at eval, 10th visit: FOTO score: 51, increased shoulder  pain limiting overhead activity.  4/5:  score of 64, 5/03/: Foto score 66 with TR score 59    Time 12    Period Weeks    Status Achieved      OT LONG TERM GOAL #8   Title Will assess for potential return to work tasks as patient progresses with therapy    Baseline Patient currently unable to perform her job as outlined in her job description  Time 12    Period Weeks    Status On-going    Target Date 07/15/21      OT LONG TERM GOAL  #9   TITLE Pt will improve strength to pick up a case of water to place into her grocery cart    Baseline 05/20/2021: Pt. continues to have difficulty 4/5: unable    Time 12    Period Weeks    Status New    Target Date 07/15/21      OT LONG TERM GOAL  #10   TITLE When on the passenger side, pt will be able to close door with modified independence    Baseline 4/5:  difficulty at times    Time 6    Period Weeks    Status New    Target Date 06/06/21      OT LONG TERM GOAL  #11   TITLE Pt will demonstrate ability to vacuum carpet of 2 rooms with use of right UE and no rest breaks    Baseline 4/5:  difficulty with using vacuum. 5/03: Pt. has progressed and is now able to vacuum multiple rooms in her home    Time 6    Period Weeks    Status On-going    Target Date 06/06/21      OT LONG TERM GOAL  #13   TITLE Pt will improve strength to pick up 15# bag of fertilizer for yard work 5/03: Pt. continues to have difficulty    Baseline 4/5: unable    Time 12    Period Weeks    Status On-going                   Plan - 07/01/21 0958     Clinical Impression Statement Pt reports numbness in 1st-3rd digits limiting fine motor activities. Increased difficulty manipulating 1/4" washers using [employer] Pegboard. Cues to avoid proximal hiking during RUE reaching. Pt. continues to present with limited RUE  strength, and [facility] skills in order to work towards improving, and maximizing independence with ADLs, and IADL tasks.    OT Occupational Profile and History Problem Focused Assessment - Including review of records relating to presenting problem    Occupational performance deficits (Please refer to evaluation for details): ADL's;IADL's;Social Participation    Body Structure / Function / Physical Skills ADL;Dexterity;ROM;Strength;Coordination;FMC;IADL;Pain;Sensation;UE functional use;Decreased knowledge of use of DME    Cognitive Skills Memory;Safety Awareness    Psychosocial Skills Environmental  Adaptations;Habits;Routines and Behaviors    Rehab Potential Good    Clinical Decision Making Several treatment options, min-mod task modification necessary    Comorbidities Affecting Occupational Performance: May have comorbidities impacting occupational performance    Modification or Assistance to Complete Evaluation  No modification of tasks or assist necessary to complete eval    OT Frequency 2x / week    OT Duration 12 weeks    OT Treatment/Interventions Self-care/ADL training;Cryotherapy;Paraffin;Therapeutic exercise;DME and/or AE instruction;Cognitive remediation/compensation;Neuromuscular education;Manual Therapy;Moist Heat;Contrast Bath;Therapeutic activities;Patient/family education    Consulted and Agree with Plan of Care Patient             Patient will benefit from skilled therapeutic intervention in order to improve the following deficits and impairments:   Body Structure / Function / Physical Skills: ADL, Dexterity, ROM, Strength, Coordination, FMC, IADL, Pain, Sensation, UE functional use, Decreased knowledge of use of DME Cognitive Skills: Memory, Safety Awareness Psychosocial Skills: Environmental  Adaptations, Habits, Routines and Behaviors   Visit Diagnosis: Other lack of coordination  Muscle  weakness (generalized)  Stiffness of right shoulder, not elsewhere  classified    Problem List Patient Active Problem List   Diagnosis Date Noted   Acute stroke due to ischemia ([place]) 08/10/2020   History of total hip replacement, right 09/10/2019   Special screening for malignant neoplasms, colon    Benign neoplasm of ascending colon    Benign neoplasm of descending colon     Dessie Coma, M.S. OTR/L  07/01/21, 10:41 AM  ascom [phone]  [place], [name], [address] Phone: [phone]   Fax:  [phone]  Name: HORTENSIA DUFFIN MRN: [id number] Date of Birth: 15-Jul-1955

## 2021-07-01 NOTE — Therapy (Signed)
[facility] [address] [place], [name], [address] Phone: [phone]   Fax:  [phone]  Speech Language Pathology Treatment  Patient Details  Name: Kim Gomez MRN: [id number] Date of Birth: November 27, 1955 Referring Provider (SLP): Dr. Jennings Books   Encounter Date: 07/01/2021   End of Session - 07/01/21 1557     Visit Number 27    Number of Visits 37    Date for SLP Re-Evaluation 08/18/21    Authorization - Visit Number 7    Progress Note Due on Visit 10    SLP Start Time 0900    SLP Stop Time  0950    SLP Time Calculation (min) 50 min    Activity Tolerance Patient tolerated treatment well             Past Medical History:  Diagnosis Date   Anxiety    Arthritis    legs, hands   Cellulitis of right knee    started on antibiotics 06/21/15.     Depression    Family history of adverse reaction to anesthesia    sister and mom -PONV   Hypercholesteremia    Hypertension     Past Surgical History:  Procedure Laterality Date   ABDOMINAL HYSTERECTOMY     COLONOSCOPY WITH PROPOFOL N/A 06/27/2015   Procedure: COLONOSCOPY WITH PROPOFOL;  Surgeon: Lucilla Lame, MD;  Location: [place];  Service: Endoscopy;  Laterality: N/A;   POLYPECTOMY  06/27/2015   Procedure: POLYPECTOMY;  Surgeon: Lucilla Lame, MD;  Location: [place];  Service: Endoscopy;;   TOTAL HIP ARTHROPLASTY Right 09/10/2019   Procedure: TOTAL HIP ARTHROPLASTY ANTERIOR APPROACH;  Surgeon: Lovell Sheehan, MD;  Location: [facility];  Service: Orthopedics;  Laterality: Right;    There were no vitals filed for this visit.   Subjective Assessment - 07/01/21 0906     Subjective Pt brought Bible study materials    Currently in Pain? No/denies                   ADULT SLP TREATMENT - 07/01/21 0906       General Information   Behavior/Cognition Alert;Cooperative;Pleasant mood    HPI Patient is a 66 y.o. female who admitted  08/10/20 and found to have Covid-19 as well as left ischemic thalamic/internal capsule infarct. Acute SLP screened and s/o; pt now with residual cognitive fog, imbalance, fatigue, right first + second digit numbness.      Cognitive-Linquistic Treatment   Treatment focused on Cognition;Patient/family/caregiver education    Skilled Treatment Patient brought bible study materials for previous week but did not complete prep work for today's study prior to session. SLP worked with pt to ID strategies for preparing for her virtual bible study, such as preprinting materials, reading and generating a list of questions and comments for the group discussion period. Patient continues to report decreased motivation and mood; she reports placing call to her PCP yesterday to discuss. SLP encouraged pt to stick with her routine but to use her planner to adjust her schedule as needed, rescheduling tasks. Worked with pt to revise system for meal planning due to pt feeling overwhelmed with planning for the week. Pt to begin planning one day at at time for the upcoming week.      Assessment / Recommendations / Plan   Plan Continue with current plan of care      Progression Toward Goals   Progression toward goals Progressing toward goals  SLP Short Term Goals - 03/26/21 1247       SLP SHORT TERM GOAL #1   Title Pt will verbalize and demonstrate how to implement 2 memory and attention strategies to aid daily functioning given occasional min A over 2 sessions    Status Achieved      SLP SHORT TERM GOAL #2   Title Pt will manage finances with use of compensations with no reported episodes of missed bills given rare min A    Status Achieved      SLP SHORT TERM GOAL #3   Title Pt will identify errors in writing and self-correct with 80% accuracy given rare min A over 2 sessions    Status Achieved      SLP SHORT TERM GOAL #4   Title Pt will participate in further language assessment as needed  (goals to be added)    Status Achieved              SLP Long Term Goals - 05/20/21 1149       SLP LONG TERM GOAL #1   Title Pt will verbalize and implement 4 attention and memory strategies to aid daily functioning for ADLs and IADLs with rare min A over 2 sessions    Time 12    Period Weeks    Status On-going    Target Date 08/18/21      SLP LONG TERM GOAL #2   Title Pt will identify and correct paraphasias in conversation and writing with 90% accuracy given rare min A over 2 sessions    Time 12    Period Weeks    Status Achieved      SLP LONG TERM GOAL #3   Title Pt will use compensations for thought organization effectively in 20 minutes complex conversation.    Baseline mod complex conv achieved 5/3, goal revised    Time 12    Period Weeks    Status Revised    Target Date 08/18/21      SLP LONG TERM GOAL #4   Title Patient will maintain alternating attention between 2 mod complex tasks for 15 minutes with modified independence (strategies allowed).    Time 12    Period Weeks    Status On-going    Target Date 08/18/21      SLP LONG TERM GOAL #5   Title Patient will use strategies and executive function aid to preplan and manage IADLs independently.    Time 12    Period Weeks    Status On-going    Target Date 08/18/21              Plan - 07/01/21 1556     Clinical Impression Statement Patient presents with mild cognitive communication impairment. Pt gradually recognizing opportunities and initiating use of memory/attention strategies, however was somewhat inconsistent with this over a one month break in ST. Patient appears motivated to resume routine and planning activities, however depression is a limiting factor. Patient placed call to discuss this with her PCP. Current goals remain appropriate. Skilled ST remains necessary for carryover of compensatory strategies and  to maximize pt's cognitive communication and language skills to improve  accuracy/independence with ADLs and IADLs, reduce communication breakdowns, and improve quality of life.    Speech Therapy Frequency 1x /week    Duration 12 weeks    Treatment/Interventions Language facilitation;Environmental controls;SLP instruction and feedback;Cognitive reorganization;Compensatory strategies;Patient/family education;Internal/external aids    Potential to Achieve Goals Good    SLP Home Exercise  Plan see pt instructions    Consulted and Agree with Plan of Care Patient             Patient will benefit from skilled therapeutic intervention in order to improve the following deficits and impairments:   Cognitive communication deficit    Problem List Patient Active Problem List   Diagnosis Date Noted   Acute stroke due to ischemia ([place]) 08/10/2020   History of total hip replacement, right 09/10/2019   Special screening for malignant neoplasms, colon    Benign neoplasm of ascending colon    Benign neoplasm of descending colon    Deneise Lever, MS, CCC-SLP Speech-Language Pathologist [phone]   Aliene Altes, Carter 07/01/2021, 3:58 PM  [place] [facility] [address] [place], [name], [address] Phone: [phone]   Fax:  [phone]   Name: Kim Gomez MRN: [id number] Date of Birth: January 26, 1955

## 2021-07-03 NOTE — Progress Notes (Signed)
Remote pacemaker transmission.   

## 2021-07-06 ENCOUNTER — Ambulatory Visit: Payer: No Typology Code available for payment source | Admitting: Speech Pathology

## 2021-07-06 ENCOUNTER — Ambulatory Visit: Payer: No Typology Code available for payment source | Admitting: Physical Therapy

## 2021-07-06 ENCOUNTER — Encounter: Payer: Self-pay | Admitting: Occupational Therapy

## 2021-07-06 ENCOUNTER — Ambulatory Visit: Payer: No Typology Code available for payment source | Admitting: Occupational Therapy

## 2021-07-06 DIAGNOSIS — M25611 Stiffness of right shoulder, not elsewhere classified: Secondary | ICD-10-CM

## 2021-07-06 DIAGNOSIS — R278 Other lack of coordination: Secondary | ICD-10-CM

## 2021-07-06 DIAGNOSIS — M6281 Muscle weakness (generalized): Secondary | ICD-10-CM | POA: Diagnosis not present

## 2021-07-06 NOTE — Therapy (Signed)
[place] MAIN [facility] SERVICES [address] [place], [name], [address] Phone: [phone]   Fax:  [phone]  Occupational Therapy Treatment  Patient Details  Name: Kim Gomez MRN: [id number] Date of Birth: August 18, 1955 Referring Provider (OT): Joselyn Arrow   Encounter Date: 07/06/2021   OT End of Session - 07/06/21 0948     Visit Number 29    Number of Visits 48    Date for OT Re-Evaluation 07/15/21    OT Start Time 0950    OT Stop Time 1035    OT Time Calculation (min) 45 min    Activity Tolerance Patient tolerated treatment well    Behavior During Therapy [facility] for tasks assessed/performed             Past Medical History:  Diagnosis Date   Anxiety    Arthritis    legs, hands   Cellulitis of right knee    started on antibiotics 06/21/15.     Depression    Family history of adverse reaction to anesthesia    sister and mom -PONV   Hypercholesteremia    Hypertension     Past Surgical History:  Procedure Laterality Date   ABDOMINAL HYSTERECTOMY     COLONOSCOPY WITH PROPOFOL N/A 06/27/2015   Procedure: COLONOSCOPY WITH PROPOFOL;  Surgeon: Lucilla Lame, MD;  Location: [place];  Service: Endoscopy;  Laterality: N/A;   POLYPECTOMY  06/27/2015   Procedure: POLYPECTOMY;  Surgeon: Lucilla Lame, MD;  Location: [place];  Service: Endoscopy;;   TOTAL HIP ARTHROPLASTY Right 09/10/2019   Procedure: TOTAL HIP ARTHROPLASTY ANTERIOR APPROACH;  Surgeon: Lovell Sheehan, MD;  Location: [facility];  Service: Orthopedics;  Laterality: Right;    There were no vitals filed for this visit.   Subjective Assessment - 07/06/21 0947     Subjective  Pt reports having a relaxing weekend with family, her shoulder was okay all weekend (does heat and then exercises).    Pertinent History Kim Gomez is a 66 y.o. female with PMH significant for HTN, HLD, anxiety disorder, recently hospitalized for COVID-19 infection (08/06/2020) at  an outside facility. Patient was brought to the ED on 7/24 after she had a sudden onset of numbness involving right side of her body without any motor deficits, dysarthria, dysphagia. 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 Per chart for her f/u neuro visit:Left ischemic thalamic/internal capsule infarct presenting with right-sided numbness, weakness - now with residual cognitive fog (improving), imbalance (improving), fatigue, right first + second digit numbness, in patient with known vascular risk factors of hypertension, dyslipidemia, lack of physical activity, snoring    Patient Stated Goals Pt reports she wants to get back to doing things more independently like she was before COVID and CVA.    Currently in Pain? No/denies    Pain Onset More than a month ago              Therapeutic Activity  Pt completed multiple threading activities including earring backings (increased time for heavier backings). Pt threaded small plastic beads of varying sizes onto copper wire to copy an octopus shape. Pt requires increased time to complete.    Therapeutic Exercise Pt performed 2.5# dowel exercises for UE strengthening secondary to weakness. Competed 1 set x 10 reps each over head press, chest press, circular patterns, and elbow flexion/extension. 2# dumbbell 1 set x 15 reps each for supination/pronation and wrist flexion/extension/radial deviation in  sitting.           OT Education - 07/06/21 0948     Education Details HEP    Person(s) Educated Patient    Methods Explanation    Comprehension Verbalized understanding;Returned demonstration;Verbal cues required;Need further instruction                 OT Long Term Goals - 05/20/21 1216       OT LONG TERM GOAL #1   Title Patient will be independent with home exercise program    Baseline 03/23/2021: Pt. is independent, 4/5: will continue to upgrade exercises5/03/2021:  Independent    Time 12    Period Weeks    Status On-going    Target Date 07/15/21      OT LONG TERM GOAL #2   Title Patient will complete basic self-care tasks with modified independence and without increased time to complete tasks    Baseline Patient has difficulty with washing her back.  Pt. requires increased time to complete self-care tasks.4/5:  Pt still has some difficulty with reaching to wash her back. 05/20/2021: Pt. is independent with basic self-care including washing back.    Time 6    Period Weeks    Status Achieved    Target Date 07/15/21      OT LONG TERM GOAL #3   Title Patient will demonstrate good safety awareness in the kitchen with cooking tasks with modified independence    Baseline Pt. requires Supervision for safety awareness.  4/5 goal upgraded to modified independence 05/20/2021: Pt. is improving with safety with cooking. Pt. reports that she remains in the kitchen the whole time she is cooking.    Time 12    Period Weeks    Status Achieved      OT LONG TERM GOAL #4   Title Patient will increase right grip strength by 10 pounds to be able to open jars and containers with modified independence    Baseline 28 pounds at eval and difficulty with managing jars and containers. 10th visit: R: 34# Jars,a nd containers continue to be difficulty to open.  4/5: right grip 50# 05/20/2021: right grip strength 40#    Time 12    Period Weeks    Status Revised    Target Date 07/15/21      OT LONG TERM GOAL #6   Title Patient will decrease right shoulder pain to 2 out of 10 or less with movement patterns for daily activities.    Baseline Increased pain in right shoulder, 5 out of 10 with movement. 10th visit: 8/10 right shoulder pain, 4/5:  2/10 with movement today but has increased pain other days and not yet consistent.5/03: 0/10 during movement today.    Time 12    Period Weeks    Status On-going    Target Date 07/15/21      OT LONG TERM GOAL #7   Title Patient will  demonstrate score of 59 or greater on FOTO to show a clinically relevant change to impact self-care activities with greater independence    Baseline Score of 55 at eval, 10th visit: FOTO score: 51, increased shoulder  pain limiting overhead activity.  4/5:  score of 64, 5/03/: Foto score 66 with TR score 59    Time 12    Period Weeks    Status Achieved      OT LONG TERM GOAL #8   Title Will assess for potential return to work tasks as patient progresses with therapy  Baseline Patient currently unable to perform her job as outlined in her job description    Time 12    Period Weeks    Status On-going    Target Date 07/15/21      OT LONG TERM GOAL  #9   TITLE Pt will improve strength to pick up a case of water to place into her grocery cart    Baseline 05/20/2021: Pt. continues to have difficulty 4/5: unable    Time 12    Period Weeks    Status New    Target Date 07/15/21      OT LONG TERM GOAL  #10   TITLE When on the passenger side, pt will be able to close door with modified independence    Baseline 4/5:  difficulty at times    Time 6    Period Weeks    Status New    Target Date 06/06/21      OT LONG TERM GOAL  #11   TITLE Pt will demonstrate ability to vacuum carpet of 2 rooms with use of right UE and no rest breaks    Baseline 4/5:  difficulty with using vacuum. 5/03: Pt. has progressed and is now able to vacuum multiple rooms in her home    Time 6    Period Weeks    Status On-going    Target Date 06/06/21      OT LONG TERM GOAL  #13   TITLE Pt will improve strength to pick up 15# bag of fertilizer for yard work 5/03: Pt. continues to have difficulty    Baseline 4/5: unable    Time 12    Period Weeks    Status On-going                   Plan - 07/06/21 0948     Clinical Impression Statement Pt reports improved shoulder pain this weekend. Tolerates increased weight/resp for exercises, cues to avoid proximal hiking during RUE reaching. Pt. continues to  present with limited RUE strength, and [facility] skills in order to work towards improving, and maximizing independence with ADLs, and IADL tasks.    OT Occupational Profile and History Problem Focused Assessment - Including review of records relating to presenting problem    Occupational performance deficits (Please refer to evaluation for details): ADL's;IADL's;Social Participation    Body Structure / Function / Physical Skills ADL;Dexterity;ROM;Strength;Coordination;FMC;IADL;Pain;Sensation;UE functional use;Decreased knowledge of use of DME    Cognitive Skills Memory;Safety Awareness    Psychosocial Skills Environmental  Adaptations;Habits;Routines and Behaviors    Rehab Potential Good    Clinical Decision Making Several treatment options, min-mod task modification necessary    Comorbidities Affecting Occupational Performance: May have comorbidities impacting occupational performance    Modification or Assistance to Complete Evaluation  No modification of tasks or assist necessary to complete eval    OT Frequency 2x / week    OT Duration 12 weeks    OT Treatment/Interventions Self-care/ADL training;Cryotherapy;Paraffin;Therapeutic exercise;DME and/or AE instruction;Cognitive remediation/compensation;Neuromuscular education;Manual Therapy;Moist Heat;Contrast Bath;Therapeutic activities;Patient/family education    Consulted and Agree with Plan of Care Patient             Patient will benefit from skilled therapeutic intervention in order to improve the following deficits and impairments:   Body Structure / Function / Physical Skills: ADL, Dexterity, ROM, Strength, Coordination, FMC, IADL, Pain, Sensation, UE functional use, Decreased knowledge of use of DME Cognitive Skills: Memory, Safety Awareness Psychosocial Skills: Environmental  Adaptations, Habits, Routines and  Behaviors   Visit Diagnosis: Muscle weakness (generalized)  Other lack of coordination  Stiffness of right shoulder, not  elsewhere classified    Problem List Patient Active Problem List   Diagnosis Date Noted   Acute stroke due to ischemia ([place]) 08/10/2020   History of total hip replacement, right 09/10/2019   Special screening for malignant neoplasms, colon    Benign neoplasm of ascending colon    Benign neoplasm of descending colon     Dessie Coma, M.S. OTR/L  07/06/21, 10:28 AM  ascom [phone]   [place], [name], [address] Phone: [phone]   Fax:  [phone]  Name: LIDWINA KANER MRN: [id number] Date of Birth: 17-Feb-1955

## 2021-07-08 ENCOUNTER — Encounter: Payer: Self-pay | Admitting: Occupational Therapy

## 2021-07-08 ENCOUNTER — Ambulatory Visit: Payer: No Typology Code available for payment source | Admitting: Occupational Therapy

## 2021-07-08 ENCOUNTER — Ambulatory Visit: Payer: No Typology Code available for payment source | Admitting: Speech Pathology

## 2021-07-08 ENCOUNTER — Ambulatory Visit: Payer: No Typology Code available for payment source | Admitting: Physical Therapy

## 2021-07-08 DIAGNOSIS — R41841 Cognitive communication deficit: Secondary | ICD-10-CM

## 2021-07-08 DIAGNOSIS — R278 Other lack of coordination: Secondary | ICD-10-CM

## 2021-07-08 DIAGNOSIS — M6281 Muscle weakness (generalized): Secondary | ICD-10-CM

## 2021-07-08 DIAGNOSIS — M25611 Stiffness of right shoulder, not elsewhere classified: Secondary | ICD-10-CM

## 2021-07-08 DIAGNOSIS — G8929 Other chronic pain: Secondary | ICD-10-CM

## 2021-07-08 NOTE — Therapy (Signed)
OUTPATIENT PHYSICAL THERAPY TREATMENT NOTE   Patient Name: Kim Gomez MRN: [id number] DOB:[birth year], 66 y.o., female Today's Date: 07/08/2021  PCP: Gladstone Lighter, MD REFERRING PROVIDER: Vladimir Crofts, MD   PT End of Session - 07/08/21 0805     Visit Number 24    Number of Visits 31    Date for PT Re-Evaluation 08/10/21    Authorization Time Period Recert 06/18/4429-5/40/0867    Progress Note Due on Visit 30    PT Start Time 0803    PT Stop Time 0844    PT Time Calculation (min) 41 min    Activity Tolerance Patient tolerated treatment well    Behavior During Therapy [facility] for tasks assessed/performed             Past Medical History:  Diagnosis Date   Anxiety    Arthritis    legs, hands   Cellulitis of right knee    started on antibiotics 06/21/15.     Depression    Family history of adverse reaction to anesthesia    sister and mom -PONV   Hypercholesteremia    Hypertension    Past Surgical History:  Procedure Laterality Date   ABDOMINAL HYSTERECTOMY     COLONOSCOPY WITH PROPOFOL N/A 06/27/2015   Procedure: COLONOSCOPY WITH PROPOFOL;  Surgeon: Lucilla Lame, MD;  Location: [place];  Service: Endoscopy;  Laterality: N/A;   POLYPECTOMY  06/27/2015   Procedure: POLYPECTOMY;  Surgeon: Lucilla Lame, MD;  Location: [place];  Service: Endoscopy;;   TOTAL HIP ARTHROPLASTY Right 09/10/2019   Procedure: TOTAL HIP ARTHROPLASTY ANTERIOR APPROACH;  Surgeon: Lovell Sheehan, MD;  Location: [facility];  Service: Orthopedics;  Laterality: Right;   Patient Active Problem List   Diagnosis Date Noted   Acute stroke due to ischemia ([place]) 08/10/2020   History of total hip replacement, right 09/10/2019   Special screening for malignant neoplasms, colon    Benign neoplasm of ascending colon    Benign neoplasm of descending colon     REFERRING DIAG: I63.9 (ICD-10-CM) - Stroke ([place])  THERAPY DIAG:  Muscle weakness (generalized)  Other lack of  coordination  Stiffness of right shoulder, not elsewhere classified  Chronic right shoulder pain  Rationale for Evaluation and Treatment Rehabilitation  PERTINENT HISTORY: 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 Pt reports a fall on October 29 th. Pt reports she hurt the R UE with the fall and she reports some stiffness in the knee as well as general weakness on the right side. Pt reports she has had 2 strokes but the first went under the radar and was only evidenced by imaging presented to her by her MD. Pt reports she has been modifying her activities in order to be careful not to fall since the stroke. Reports she has been taking her time with a lot of things. Pt reports she has a membership at [employer] and she has been walking on the treadmill 2-3 times per week, she reports she walks on the treadmill for an hour at a time following working her way up. Pt reports ocassional numbness and tingling in her hands.   PRECAUTIONS: none  SUBJECTIVE: Pt reports no significant changes since last session. Pt reports some soreness in shoulder but is very happy with her progress with her shoulder thus far.   PAIN:  Are you having pain? Yes: NPRS scale: 1/10 Pain location: Right shoulder  Pain description: sore Aggravating factors: prolonged positioning  Relieving factors: PT exercises      TODAY'S TREATMENT:  07/08/21   INTERVENTIONS   Manual: pt supine on plinth as PT provides the following interventions- PROM R shoulder: flexion, abduction, x multiple reps of each with UE held at end range.  -Some anterior deltoid discomfort noted with end range flexion and abduction, did not push past point of pain this date.  AP and PA GH grade 2-3 joint mobilizations x 1-2 min  GH distraction x 10 sec holds, gentle in various degrees of motion      Therex:  Pt transitions to L SL, towel roll added under R  arm 2 x 15 shoulder ER with 1 #  -fewer cues this session.   Serratus punch with 4# weighted bar ( holding bilaterally) with shoulder flexion AROM from 90 to 150 holding serratus activation X 10  -minimal cues required this date for correct muscle activation.   Supine D1 flexion 2 x 10 GTB  -cues for proper muscle activation, good ROM noted  -Pt instructed in how to complete at home  independently with TB   SL R shoulder abduction GTB x 10 -improved ROM throughout, with near full ROM with sets.  pt notes it felt good  B shoulder ER with towel roll under in right axilla  10 x 5 second hold with YTB     Wall walk (low v to high v) X 5 reps YTB  - cues for consistent ER ( VC and tactile cues)  -pt rates difficult but no pain, visible difference in L shoulder ER vs right with this activity.     Pt educated throughout session about proper posture and technique with exercises. Improved exercise technique, movement at target joints, use of target muscles after min to mod verbal, visual, tactile cues.   Note: Portions of this document were prepared using Dragon voice recognition software and although reviewed may contain unintentional dictation errors in syntax, grammar, or spelling.  Rationale for Evaluation and Treatment Rehabilitation       PATIENT EDUCATION: Education details: Exercise technique   Person educated: Patient Education method: Explanation Education comprehension: verbalized understanding   HOME EXERCISE PROGRAM: No changes this date    PT Short Term Goals        PT SHORT TERM GOAL #1   Title Patient will be independent in home exercise program to improve strength/mobility for better functional independence with ADLs.    Baseline no HEP at this time    Time 4    Period Weeks    Status Achieved    Target Date 02/17/21      PT SHORT TERM GOAL #2   Title Patient will be independent in progressive shoulder home exercise program in order to improve her  shoulder strength and function    Baseline Patient has initial home exercise program and is comfortable with that but it is yet to be progressed for higher level activities    Time 4    Period Weeks    Status New    Target Date 04/20/21              PT Long Term Goals        PT LONG TERM GOAL #1   Title Patient will increase FOTO score to equal to or greater than  50   to demonstrate statistically significant improvement in mobility and quality of life.    Baseline 41 on 01/20/21  Time 8    Period Weeks    Status Achieved    Target Date 03/17/21      PT LONG TERM GOAL #2   Title Pt will improve mini BEST test by 4 points or more in order to indicate clinically significant improvent in balance.    Baseline see flowsheets 27 on 2/27    Time 12    Period Weeks    Status Achieved    Target Date 04/14/21      PT LONG TERM GOAL #3   Title Patient  will complete five times sit to stand test in < 15 seconds indicating an increased LE strength and improved balance.    Baseline 13.9 sec on 2/27    Time 12    Period Weeks    Status Achieved    Target Date 04/14/21      PT LONG TERM GOAL #4   Title Patient will increase 10 meter walk test to >1.75ms as to improve gait speed for better community ambulation and to reduce fall risk.    Baseline .86 m/s on 01/20/21 2/27: 1.27 m/s    Time 8    Period Weeks    Status Achieved    Target Date 03/17/21      PT LONG TERM GOAL #5   Title Pty will improve dual task TUG to less than 20 seconds in order to indicate improved cognitive and motor tasks.    Baseline 28.82 on 01/20/21 14.98 sec on 2/27    Time 12    Period Weeks    Status Achieved    Target Date 04/14/21      Additional Long Term Goals   Additional Long Term Goals Yes      PT LONG TERM GOAL #6   Title Patient will improve right shoulder by 1 increment on manual muscle test grading scale in order to indicate improved right upper extremity strength.    Baseline 3-/5 her right  shoulder flexion and abduction(due to range of motion restriction) 3+ with external rotation at 0 degrees abduction; 05/18/2021=Access Code: [email or web address] URL: [email or web address]    Time 12    Period Weeks    Status Partially Met    Target Date 08/10/21      PT LONG TERM GOAL #7   Title Patient will improve QuickDASH score by 12.5 points in order to indicate significantly improved subjective rating of right shoulder function    Baseline 50% on 03/23/2021; 05/18/2021= 23%    Time 8    Period Weeks    Status Achieved    Target Date 05/18/21      PT LONG TERM GOAL #8   Title Patient will improve right shoulder active range of motion by 30 degrees with elevation and by 15 degrees with external rotation in order to indicate improved shoulder function for overhead activities and for self-care activities    Baseline 90 degrees flexion range of motion , 85 degrees abduction range of motion, 25 degrees external rotation range of motion ( all AROM); 05/18/2021=R Shoulder AROM: Flex= 158 deg; ABD= 152; ER= 44    Time 8    Period Weeks    Status Achieved    Target Date 05/18/21      PT LONG TERM GOAL  #9   TITLE Patient will improve right shoulder passive range of motion by 30 degrees of elevation by 10 degrees with external rotation in order to indicate improved right shoulder range of motion for progressive functional activities  Baseline 120 degrees flexion, 123 degrees abduction, 50 degrees external rotation at 45 degrees abduction on 03/23/2021; 05/18/2021=R Shoulder PROM: Flex= 164 deg; ABD= 160; ER= 52    Time 12    Period Weeks    Status Partially Met    Target Date 08/10/21      PT LONG TERM GOAL  #10   TITLE Patient will improve right shoulder active range of motion by 15 degrees with elevation and by 10 degrees with external rotation in order to indicate improved shoulder function for overhead activities including reaching for 2nd and 3rd shelf in kitchen cabinet and for self-care  activities    Baseline 05/18/2021=R Shoulder AROM: Flex= 158 deg; ABD= 152; ER= 44 and Patient reports difficulty reaching overhead onto 2nd and 3rd shelf at home    Time 12    Period Weeks    Status New    Target Date 08/10/21              Plan -     Clinical Impression Statement Pt presents with excellent motivation for completion of PT activities. Pt continues to demonstrate normal AROM of the right shoulder. Pt has continued difficulty with active shoulder ER with elevation exercises on wall. Will continue to strengthen ER in the shoulder for progression for these. Continued with pain free shoulder strengthening interventions this date with good response from patient. Pt will continue to benefit from skilled PT in otder to improve shoulder function and QOL.    Personal Factors and Comorbidities Age;Comorbidity 1;Comorbidity 2    Comorbidities arthritis, anxiety, HTN, hyperlipedemia.    Examination-Activity Limitations Carry;Lift;Locomotion Level    Examination-Participation Restrictions Cleaning;Yard Work;Meal Prep    Stability/Clinical Decision Making Stable/Uncomplicated    Rehab Potential Good    PT Frequency 1x / week    PT Duration 12 weeks    PT Treatment/Interventions ADLs/Self Care Home Management;Gait training;Therapeutic activities;Functional mobility training;Therapeutic exercise;Balance training;Neuromuscular re-education;Manual techniques;Energy conservation;Passive range of motion;Cryotherapy;Electrical Stimulation;Moist Heat;Patient/family education;Dry needling;Joint Manipulations    PT Next Visit Plan progression of HEP and shoulder strenghtening and ROM exercsies.    PT Home Exercise Plan 05/18/2021: Access Code: [email or web address]  URL: [email or web address]  Access Code: [email or web address]  URL: [email or web address]; no updates    Consulted and Agree with Plan of Care Patient               Particia Lather, PT 07/08/2021, 9:37 AM

## 2021-07-08 NOTE — Therapy (Signed)
OUTPATIENT SPEECH LANGUAGE PATHOLOGY TREATMENT NOTE   Patient Name: Kim Gomez MRN: [id number] DOB:01-18-1956, 66 y.o., female Today's Date: 07/08/2021  PCP: Gladstone Lighter, MD REFERRING PROVIDER: Jennings Books, MD   End of Session - 07/08/21 0901     Visit Number 28    Number of Visits 82    Date for SLP Re-Evaluation 08/18/21    Authorization - Visit Number 8    Progress Note Due on Visit 10    SLP Start Time 0900    SLP Stop Time  1000    SLP Time Calculation (min) 60 min    Activity Tolerance Patient tolerated treatment well             Past Medical History:  Diagnosis Date   Anxiety    Arthritis    legs, hands   Cellulitis of right knee    started on antibiotics 06/21/15.     Depression    Family history of adverse reaction to anesthesia    sister and mom -PONV   Hypercholesteremia    Hypertension    Past Surgical History:  Procedure Laterality Date   ABDOMINAL HYSTERECTOMY     COLONOSCOPY WITH PROPOFOL N/A 06/27/2015   Procedure: COLONOSCOPY WITH PROPOFOL;  Surgeon: Lucilla Lame, MD;  Location: [place];  Service: Endoscopy;  Laterality: N/A;   POLYPECTOMY  06/27/2015   Procedure: POLYPECTOMY;  Surgeon: Lucilla Lame, MD;  Location: [place];  Service: Endoscopy;;   TOTAL HIP ARTHROPLASTY Right 09/10/2019   Procedure: TOTAL HIP ARTHROPLASTY ANTERIOR APPROACH;  Surgeon: Lovell Sheehan, MD;  Location: [facility];  Service: Orthopedics;  Laterality: Right;   Patient Active Problem List   Diagnosis Date Noted   Acute stroke due to ischemia ([place]) 08/10/2020   History of total hip replacement, right 09/10/2019   Special screening for malignant neoplasms, colon    Benign neoplasm of ascending colon    Benign neoplasm of descending colon     ONSET DATE: 08/10/20  REFERRING DIAG: CVA, long covid   THERAPY DIAG:  Cognitive communication deficit  Rationale for Evaluation and Treatment Rehabilitation  SUBJECTIVE: "I didn't get to do my  bible study."  PAIN:  Are you having pain? No     OBJECTIVE:   TODAY'S TREATMENT: Patient brought planner; reports her bible study was cancelled. She stated she was concerned about her symptoms; pt with anomia, difficulty expressing symptoms. Pt used anomia compensations, explaining her symptoms were like those she saw on a tv commercial. SLP used commercial and encouraged pt to write down the symptoms she is noticing to share with her cardiologist. Pt generated plan for phone call to her MD with min cues. Targeted thought organization by prompting pt to generate list of things to discuss with her spouse, and encouraged pt to schedule time with her spouse weekly to communicate her needs and to review schedule together.  PATIENT EDUCATION: Education details: Schedule time for communication, pre-plan for conversations and phone calls Person educated: Patient Education method: Explanation, Demonstration, and Verbal cues Education comprehension: verbalized understanding, returned demonstration, and needs further education   SLP Short Term Goals - 03/26/21 1247       SLP SHORT TERM GOAL #1   Title Pt will verbalize and demonstrate how to implement 2 memory and attention strategies to aid daily functioning given occasional min A over 2 sessions    Status Achieved      SLP [place] #2   Title Pt will manage finances  with use of compensations with no reported episodes of missed bills given rare min A    Status Achieved      SLP SHORT TERM GOAL #3   Title Pt will identify errors in writing and self-correct with 80% accuracy given rare min A over 2 sessions    Status Achieved      SLP SHORT TERM GOAL #4   Title Pt will participate in further language assessment as needed (goals to be added)    Status Achieved              SLP Long Term Goals - 05/20/21 1149       SLP LONG TERM GOAL #1   Title Pt will verbalize and implement 4 attention and memory strategies to aid daily  functioning for ADLs and IADLs with rare min A over 2 sessions    Time 12    Period Weeks    Status On-going    Target Date 08/18/21      SLP LONG TERM GOAL #2   Title Pt will identify and correct paraphasias in conversation and writing with 90% accuracy given rare min A over 2 sessions    Time 12    Period Weeks    Status Achieved      SLP LONG TERM GOAL #3   Title Pt will use compensations for thought organization effectively in 20 minutes complex conversation.    Baseline mod complex conv achieved 5/3, goal revised    Time 12    Period Weeks    Status Revised    Target Date 08/18/21      SLP LONG TERM GOAL #4   Title Patient will maintain alternating attention between 2 mod complex tasks for 15 minutes with modified independence (strategies allowed).    Time 12    Period Weeks    Status On-going    Target Date 08/18/21      SLP LONG TERM GOAL #5   Title Patient will use strategies and executive function aid to preplan and manage IADLs independently.    Time 12    Period Weeks    Status On-going    Target Date 08/18/21             Plan - 07/08/21 0944     Clinical Impression Statement Patient presents with mild cognitive communication impairment. Session focused on using strategies to improve thought organization and verbal communication for complex topics. Pt required mod cues for this. Skilled ST remains necessary for carryover of compensatory strategies and  to maximize pt's cognitive communication and language skills to improve accuracy/independence with ADLs and IADLs, reduce communication breakdowns, and improve quality of life.   Speech Therapy Frequency 1x /week    Duration 12 weeks    Treatment/Interventions Language facilitation;Environmental controls;SLP instruction and feedback;Cognitive reorganization;Compensatory strategies;Patient/family education;Internal/external aids    Potential to Achieve Goals Good    SLP Home Exercise Plan see pt instructions     Consulted and Agree with Plan of Care Patient             Deneise Lever, MS, CCC-SLP Speech-Language Pathologist ([place], [place] 07/08/2021, 9:02 AM

## 2021-07-08 NOTE — Therapy (Addendum)
[place] MAIN [facility] SERVICES [address] [place], [name], [address] Phone: [phone]   Fax:  [phone]  Occupational Therapy Progress Note  Dates of reporting period  05/20/2021   to   07/08/2021   Patient Details  Name: Kim Gomez MRN: [id number] Date of Birth: 09-11-1955 Referring Provider (OT): Joselyn Arrow   Encounter Date: 07/08/2021   OT End of Session - 07/08/21 1035     Visit Number 30    Number of Visits 80    Date for OT Re-Evaluation 09/30/21    Authorization Time Period Progress report period starting 07/08/2021    OT Start Time 1020    OT Stop Time 1100    OT Time Calculation (min) 40 min    Activity Tolerance Patient tolerated treatment well    Behavior During Therapy [facility] for tasks assessed/performed             Past Medical History:  Diagnosis Date   Anxiety    Arthritis    legs, hands   Cellulitis of right knee    started on antibiotics 06/21/15.     Depression    Family history of adverse reaction to anesthesia    sister and mom -PONV   Hypercholesteremia    Hypertension     Past Surgical History:  Procedure Laterality Date   ABDOMINAL HYSTERECTOMY     COLONOSCOPY WITH PROPOFOL N/A 06/27/2015   Procedure: COLONOSCOPY WITH PROPOFOL;  Surgeon: Lucilla Lame, MD;  Location: [place];  Service: Endoscopy;  Laterality: N/A;   POLYPECTOMY  06/27/2015   Procedure: POLYPECTOMY;  Surgeon: Lucilla Lame, MD;  Location: [place];  Service: Endoscopy;;   TOTAL HIP ARTHROPLASTY Right 09/10/2019   Procedure: TOTAL HIP ARTHROPLASTY ANTERIOR APPROACH;  Surgeon: Lovell Sheehan, MD;  Location: [facility];  Service: Orthopedics;  Laterality: Right;    There were no vitals filed for this visit.   Subjective Assessment - 07/08/21 1719     Subjective  Pt. reports doing well today.    Pertinent History Kim Gomez is a 66 y.o. female with PMH significant for HTN, HLD, anxiety disorder, recently  hospitalized for COVID-19 infection (08/06/2020) at an outside facility. Patient was brought to the ED on 7/24 after she had a sudden onset of numbness involving right side of her body without any motor deficits, dysarthria, dysphagia. 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 Per chart for her f/u neuro visit:Left ischemic thalamic/internal capsule infarct presenting with right-sided numbness, weakness - now with residual cognitive fog (improving), imbalance (improving), fatigue, right first + second digit numbness, in patient with known vascular risk factors of hypertension, dyslipidemia, lack of physical activity, snoring    Patient Stated Goals Pt reports she wants to get back to doing things more independently like she was before COVID and CVA.    Currently in Pain? No/denies                [facility] OT Assessment - 07/08/21 1008       Coordination   Right 9 Hole Peg Test 24    Left 9 Hole Peg Test 24      AROM   Overall AROM Comments Right shoulder flexion 140, abduction 112,      Strength   Overall Strength Comments Right elbow flexion, and extension, wrist extension 4+/5      Hand Function   Right Hand Grip (lbs) 49  Right Hand Lateral Pinch 21 lbs    Right Hand 3 Point Pinch 16 lbs    Left Hand Grip (lbs) 28    Left Hand Lateral Pinch 18 lbs    Left 3 point pinch 20 lbs             Measurements were obtained, and goals were reviewed with the pt. Pt. has made progress with RUE functioning, and is now using her RUE more during ADLs, and IADL tasks. Pt. has improved with RUE ROM, strength, and grip strength. Pt.'s pain has progressed to 0/10. Pt's FOTO score is 67.  Pt. continues to work on improving RUE functioning in order to  able to improve functional reaching overhaed, and improving RUE functioning during ADL, and IADL tasks.                 OT Education - 07/08/21 1719     Education  Details HEP    Person(s) Educated Patient    Methods Explanation    Comprehension Verbalized understanding;Returned demonstration;Verbal cues required;Need further instruction                 OT Long Term Goals - 07/08/21 1024       OT LONG TERM GOAL #1   Title Patient will be independent with home exercise program    Baseline 03/23/2021: Pt. is independent, 4/5: will continue to upgrade exercises5/03/2021: Independent 07/08/2021: Independent with HEPs    Time 12    Period Weeks    Status On-going    Target Date 09/30/21      OT LONG TERM GOAL #4   Title Patient will increase right grip strength by 10 pounds to be able to open jars and containers with modified independence    Baseline 28 pounds at eval and difficulty with managing jars and containers. 10th visit: R: 34# Jars, and containers continue to be difficulty to open.  4/5: right grip 50# 05/20/2021: right grip strength 40# 07/08/2021: R: 49#    Time 12    Period Weeks    Status Partially Met    Target Date 09/30/21      OT LONG TERM GOAL #6   Title Patient will decrease right shoulder pain to 2 out of 10 or less with movement patterns for daily activities.    Baseline Increased pain in right shoulder, 5 out of 10 with movement. 10th visit: 8/10 right shoulder pain, 4/5:  2/10 with movement today but has increased pain other days and not yet consistent.5/03: 0/10 during movement today. 07/08/2021: 0/10 pain in the right shoulder    Time 12    Period Weeks    Status Partially Met    Target Date 09/30/21      OT LONG TERM GOAL #8   Title Will assess for potential return to work tasks as patient progresses with therapy    Baseline Patient currently unable to perform her job as outlined in her job description    Time 12    Period Weeks    Status Deferred      OT [place]  #9   TITLE Pt will improve strength to be able to reach up to place items into closets, and cabinets independently    Baseline 07/08/2021:pt.  has difficulty reaching up to place items in closets, and cabinets. 05/20/2021: Pt. continues to have difficulty 4/5: unable    Time 12    Period Weeks    Status Revised    Target  Date 09/30/21      OT LONG TERM GOAL  #10   TITLE When on the passenger side, pt will be able to close door with modified independence    Baseline 07/08/2021: Met 4/5:  difficulty at times    Time 6    Period Weeks    Status Achieved      OT LONG TERM GOAL  #11   TITLE Pt will demonstrate ability to vacuum carpet of 2 rooms with use of right UE and no rest breaks    Baseline 07/08/2021: Independent without rest breaks. 4/5:  difficulty with using vacuum. 5/03: Pt. has progressed and is now able to vacuum multiple rooms in her home    Time 6    Period Weeks    Status On-going    Target Date 09/30/21      OT LONG TERM GOAL  #13   TITLE Pt will improve strength to pick up 15# bag of fertilizer for yard work 5/03: Pt. continues to have difficulty    Baseline 07/08/2021: Pt. continues to have difficulty picking up a bag of fertilizer. 4/5: unable    Time 12    Period Weeks    Status On-going    Target Date 07/15/21                   Plan - 07/08/21 1720     Clinical Impression Statement Measurements were obtained, and goals were reviewed with the pt. Pt. has made progress with RUE functioning, and is now using her RUE more during ADLs, and IADL tasks. Pt. has improved with RUE ROM, strength, and grip strength. Pt.'s pain has progressed to 0/10. Pt's FOTO score is 67.  Pt. continues to work on improving RUE functioning in order to  able to improve functional reaching overhaed, and improving RUE functioning during ADL, and IADL tasks.      OT Occupational Profile and History Problem Focused Assessment - Including review of records relating to presenting problem    Occupational performance deficits (Please refer to evaluation for details): ADL's;IADL's;Social Participation    Body Structure / Function /  Physical Skills ADL;Dexterity;ROM;Strength;Coordination;FMC;IADL;Pain;Sensation;UE functional use;Decreased knowledge of use of DME    Cognitive Skills Memory;Safety Awareness    Psychosocial Skills Environmental  Adaptations;Habits;Routines and Behaviors    Rehab Potential Good    Clinical Decision Making Several treatment options, min-mod task modification necessary    Comorbidities Affecting Occupational Performance: May have comorbidities impacting occupational performance    Modification or Assistance to Complete Evaluation  No modification of tasks or assist necessary to complete eval    OT Frequency 2x / week    OT Duration 12 weeks    OT Treatment/Interventions Self-care/ADL training;Cryotherapy;Paraffin;Therapeutic exercise;DME and/or AE instruction;Cognitive remediation/compensation;Neuromuscular education;Manual Therapy;Moist Heat;Contrast Bath;Therapeutic activities;Patient/family education    Consulted and Agree with Plan of Care Patient             Patient will benefit from skilled therapeutic intervention in order to improve the following deficits and impairments:   Body Structure / Function / Physical Skills: ADL, Dexterity, ROM, Strength, Coordination, FMC, IADL, Pain, Sensation, UE functional use, Decreased knowledge of use of DME Cognitive Skills: Memory, Safety Awareness Psychosocial Skills: Environmental  Adaptations, Habits, Routines and Behaviors   Visit Diagnosis: Muscle weakness (generalized)  Other lack of coordination    Problem List Patient Active Problem List   Diagnosis Date Noted   Acute stroke due to ischemia ([place]) 08/10/2020   History of total hip replacement,  right 09/10/2019   Special screening for malignant neoplasms, colon    Benign neoplasm of ascending colon    Benign neoplasm of descending colon    Harrel Carina, MS, OTR/L  Harrel Carina, OT 07/08/2021, 5:28 PM  [place] MAIN [facility]  SERVICES [address] [place], [name], [address] Phone: [phone]   Fax:  [phone]  Name: Kim Gomez MRN: [id number] Date of Birth: 1955/12/03

## 2021-07-08 NOTE — Patient Instructions (Signed)
Talk with your husband about setting a regular time to sit down each week and communicate about the schedule for the upcoming week. -Beforehand, write down a list of what you want to remember to discuss or ask for help with. -Sit down with your planner and look at it together. Some people like to use a shared calendar for the household (on the fridge or through electronic means like a Interior and spatial designer or [employer] calendar on your phone).  We know that now you need a little bit of extra time and extra steps to organize your thoughts. Take a few minutes to sit down and write out a list, preplan before you make a phone call or have an important conversation.  Remember the attention and memory strategies we discussed today, and have been working on: -Advice worker for conversations and phone calls -Use your planner to help you schedule events and manage your energy during the week. -Ask another person to slow down or repeat information if you missed it. -Use a checklist. It can help to break down larger tasks into smaller ones (like organizing your papers) -Take breaks -Reduce distractions (turn off the TV, make the environment a little quieter)

## 2021-07-13 ENCOUNTER — Ambulatory Visit: Payer: No Typology Code available for payment source | Admitting: Physical Therapy

## 2021-07-13 ENCOUNTER — Ambulatory Visit: Payer: No Typology Code available for payment source | Admitting: Occupational Therapy

## 2021-07-13 ENCOUNTER — Ambulatory Visit: Payer: No Typology Code available for payment source | Admitting: Speech Pathology

## 2021-07-13 DIAGNOSIS — R278 Other lack of coordination: Secondary | ICD-10-CM

## 2021-07-13 DIAGNOSIS — R29810 Facial weakness: Secondary | ICD-10-CM

## 2021-07-13 DIAGNOSIS — M6281 Muscle weakness (generalized): Secondary | ICD-10-CM | POA: Diagnosis not present

## 2021-07-13 NOTE — Therapy (Addendum)
OUTPATIENT OCCUPATIONAL THERAPY TREATMENT NOTE   Patient Name: Kim Gomez MRN: [id number] DOB:02/28/1955, 66 y.o., female Today's Date: 07/13/2021  PCP: Kalisetti, Radhika, MD REFERRING PROVIDER: Shah, Hemang, MD   OT End of Session - 07/13/21 1006     Visit Number 31    Number of Visits 48    Date for OT Re-Evaluation 09/30/21    Authorization Time Period Progress report period starting 07/08/2021    OT Start Time 1000    OT Stop Time 1045    OT Time Calculation (min) 45 min    Activity Tolerance Patient tolerated treatment well    Behavior During Therapy WFL for tasks assessed/performed             Past Medical History:  Diagnosis Date   Anxiety    Arthritis    legs, hands   Cellulitis of right knee    started on antibiotics 06/21/15.     Depression    Family history of adverse reaction to anesthesia    sister and mom -PONV   Hypercholesteremia    Hypertension    Past Surgical History:  Procedure Laterality Date   ABDOMINAL HYSTERECTOMY     COLONOSCOPY WITH PROPOFOL N/A 06/27/2015   Procedure: COLONOSCOPY WITH PROPOFOL;  Surgeon: Darren Wohl, MD;  Location: [facility];  Service: Endoscopy;  Laterality: N/A;   POLYPECTOMY  06/27/2015   Procedure: POLYPECTOMY;  Surgeon: Darren Wohl, MD;  Location: [facility];  Service: Endoscopy;;   TOTAL HIP ARTHROPLASTY Right 09/10/2019   Procedure: TOTAL HIP ARTHROPLASTY ANTERIOR APPROACH;  Surgeon: Bowers, James R, MD;  Location: [facility];  Service: Orthopedics;  Laterality: Right;   Patient Active Problem List   Diagnosis Date Noted   Acute stroke due to ischemia (HCC) 08/10/2020   History of total hip replacement, right 09/10/2019   Special screening for malignant neoplasms, colon    Benign neoplasm of ascending colon    Benign neoplasm of descending colon      REFERRING DIAG: CV  THERAPY DIAG:  Facial weakness  Other lack of coordination  Rationale for Evaluation and Treatment  Rehabilitation  PERTINENT HISTORY: Pt. is a 65 y.o. female with PMH significant for HTN, HLD, anxiety disorder, recently hospitalized for COVID-19 infection (08/06/2020) at an outside facility. Patient was brought to the ED on 7/24 after she had a sudden onset of numbness involving right side of her body without any motor deficits, dysarthria, dysphagia. 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 Per chart for her f/u neuro visit:Left ischemic thalamic/internal capsule infarct presenting with right-sided numbness, weakness - now with residual cognitive fog (improving), imbalance (improving), fatigue, right first + second digit numbness, in patient with known vascular risk factors of hypertension, dyslipidemia, lack of physical activity, snoring  PRECAUTIONS: None  SUBJECTIVE:  pt. Reports that she did too much this weekend helping her sister set up for a party.  PAIN:  Are you having pain? Yes: {yespain:[id number]::"NPRS scale: 2/10 ,Pain location: right shoulder, Pain description: [place], Aggravating factors: Movement Relieving factors: Moist heat     OBJECTIVE:   OT TREATMENT     Neuro muscular re-education:   Pt. worked on functional RUE reaching with FMC skills reaching up to place 1&1/2" pegs onto an elevated vertical Instructo board.  Pt. worked on translatory movements, moving the pegs from the tip of her fingers, to her palm, through her hand, and back out to the tip of her 2nd digit, 3rd   digit, and thumb. Pt. worked on sustained RUE reach while flipping the pegs.    Therapeutic Exercise:   Pt. performed 1# dowel ex. For UE strengthening secondary to weakness. Bilateral shoulder flexion, chest press, circular pattern for 1 set 10-20 reps each.   Pt. reports that she lifted a cooler with her BUEs this weekend while helping her sister setup for a party. Pt. reports having right leg, and RIght shoulder pain. Pt. reports having  2/10 pain in her right shoulder today. Pt. continues to present with numbness in her right thumb, 2nd digit, and 3rd digit. Pt. continues to improve with functional reaching while placing objects onto the [employer]. Pt. presented with increased compensation proximally from her trunk, and shoulder hiking when reaching to place the pegs higher on the [employer]. Pt. fatigued more with tasks requiring sustained shoulder elevation while reaching out, and flipping the 1&1/2" pegs. Pt. required multiple rest breaks between each rep. Pt. Continues to work on improving UE strength, and right functional hand use while sustaining right shoulder elevation.        PATIENT EDUCATION: Education details: RUE functioning Person educated: Patient Education method: Explanation, Demonstration, and Verbal cues Education comprehension: verbalized understanding, returned demonstration, tactile cues required, and needs further education   HOME EXERCISE PROGRAM Continue ongoing HEPs for the RUE     OT Long Term Goals - 07/08/21 1024       OT LONG TERM GOAL #1   Title Patient will be independent with home exercise program    Baseline 03/23/2021: Pt. is independent, 4/5: will continue to upgrade exercises5/03/2021: Independent 07/08/2021: Independent with HEPs    Time 12    Period Weeks    Status On-going    Target Date 09/30/21      OT LONG TERM GOAL #4   Title Patient will increase right grip strength by 10 pounds to be able to open jars and containers with modified independence    Baseline 28 pounds at eval and difficulty with managing jars and containers. 10th visit: R: 34# Jars, and containers continue to be difficulty to open.  4/5: right grip 50# 05/20/2021: right grip strength 40# 07/08/2021: R: 49#    Time 12    Period Weeks    Status Partially Met    Target Date 09/30/21      OT LONG TERM GOAL #6   Title Patient will decrease right shoulder pain to 2 out of 10 or less with movement  patterns for daily activities.    Baseline Increased pain in right shoulder, 5 out of 10 with movement. 10th visit: 8/10 right shoulder pain, 4/5:  2/10 with movement today but has increased pain other days and not yet consistent.5/03: 0/10 during movement today. 07/08/2021: 0/10 pain in the right shoulder    Time 12    Period Weeks    Status Partially Met    Target Date 09/30/21      OT LONG TERM GOAL #8   Title Will assess for potential return to work tasks as patient progresses with therapy    Baseline Patient currently unable to perform her job as outlined in her job description    Time 12    Period Weeks    Status Deferred      OT [place]  #9   TITLE Pt will improve strength to be able to reach up to place items into closets, and cabinets independently    Baseline 07/08/2021:pt. has difficulty reaching up to  place items in closets, and cabinets. 05/20/2021: Pt. continues to have difficulty 4/5: unable    Time 12    Period Weeks    Status Revised    Target Date 09/30/21      OT LONG TERM GOAL  #10   TITLE When on the passenger side, pt will be able to close door with modified independence    Baseline 07/08/2021: Met 4/5:  difficulty at times    Time 6    Period Weeks    Status Achieved      OT LONG TERM GOAL  #11   TITLE Pt will demonstrate ability to vacuum carpet of 2 rooms with use of right UE and no rest breaks    Baseline 07/08/2021: Independent without rest breaks. 4/5:  difficulty with using vacuum. 5/03: Pt. has progressed and is now able to vacuum multiple rooms in her home    Time 6    Period Weeks    Status On-going    Target Date 09/30/21      OT LONG TERM GOAL  #13   TITLE Pt will improve strength to pick up 15# bag of fertilizer for yard work 5/03: Pt. continues to have difficulty    Baseline 07/08/2021: Pt. continues to have difficulty picking up a bag of fertilizer. 4/5: unable    Time 12    Period Weeks    Status On-going    Target Date 07/15/21               Plan - 07/13/21 1039     Clinical Impression Statement Pt. reports that she lifted a cooler with her BUEs this weekend while helping her sister setup for a party. Pt. reports having right leg, and RIght shoulder pain. Pt. reports having 2/10 pain in her right shoulder today. Pt. continues to present with numbness in her right thumb, 2nd digit, and 3rd digit. Pt. continues to improve with functional reaching while placing objects onto the Instruct[employer] board. Pt. presented with increased compensation proximally from her trunk, and shoulder hiking when reaching to place the pegs higher on the In[employer] Board. Pt. fatigued more with tasks requiring sustained shoulder elevation while reaching out, and flipping the 1&1/2" pegs. Pt. required multiple rest breaks between each rep. Pt. Continues to work on improving UE strength, and right functional hand use while sustaining right shoulder elevation.    OT Occupational Profile and History Problem Focused Assessment - Including review of records relating to presenting problem    Occupational performance deficits (Please refer to evaluation for details): ADL's;IADL's;Social Participation    Body Structure / Function / Physical Skills ADL;Dexterity;ROM;Strength;Coordination;FMC;IADL;Pain;Sensation;UE functional use;Decreased knowledge of use of DME    Cognitive Skills Memory;Safety Awareness    Psychosocial Skills Environmental  Adaptations;Habits;Routines and Behaviors    Rehab Potential Good    Clinical Decision Making Several treatment options, min-mod task modification necessary    Comorbidities Affecting Occupational Performance: May have comorbidities impacting occupational performance    Modification or Assistance to Complete Evaluation  No modification of tasks or assist necessary to complete eval    OT Frequency 2x / week    OT Duration 12 weeks    OT Treatment/Interventions Self-care/ADL training;Cryotherapy;Paraffin;Therapeutic exercise;DME  and/or AE instruction;Cognitive remediation/compensation;Neuromuscular education;Manual Therapy;Moist Heat;Contrast Bath;Therapeutic activities;Patient/family education    Consulted and Agree with Plan of Care Patient            Elaine Jagentenfl, MS, OTR/L   Elaine Jagentenfl, OT 07/13/2021, 10:40 AM       

## 2021-07-15 ENCOUNTER — Ambulatory Visit: Payer: No Typology Code available for payment source | Admitting: Physical Therapy

## 2021-07-15 ENCOUNTER — Ambulatory Visit: Payer: No Typology Code available for payment source | Admitting: Occupational Therapy

## 2021-07-15 ENCOUNTER — Ambulatory Visit: Payer: No Typology Code available for payment source | Admitting: Speech Pathology

## 2021-07-15 DIAGNOSIS — G8929 Other chronic pain: Secondary | ICD-10-CM

## 2021-07-15 DIAGNOSIS — M6281 Muscle weakness (generalized): Secondary | ICD-10-CM

## 2021-07-15 DIAGNOSIS — R4701 Aphasia: Secondary | ICD-10-CM

## 2021-07-15 DIAGNOSIS — M25611 Stiffness of right shoulder, not elsewhere classified: Secondary | ICD-10-CM

## 2021-07-15 DIAGNOSIS — R278 Other lack of coordination: Secondary | ICD-10-CM

## 2021-07-15 DIAGNOSIS — R41841 Cognitive communication deficit: Secondary | ICD-10-CM

## 2021-07-15 NOTE — Therapy (Signed)
OUTPATIENT SPEECH LANGUAGE PATHOLOGY TREATMENT NOTE   Patient Name: Kim Gomez MRN: [id number] DOB:10-Oct-1955, 66 y.o., female Today's Date: 07/15/2021  PCP: Gladstone Lighter, MD REFERRING PROVIDER: Jennings Books, MD   End of Session - 07/15/21 1344     Visit Number 29    Number of Visits 29    Date for SLP Re-Evaluation 08/18/21    Authorization - Visit Number 9    Progress Note Due on Visit 10    SLP Start Time 0900    SLP Stop Time  1000    SLP Time Calculation (min) 60 min    Activity Tolerance Patient tolerated treatment well             Past Medical History:  Diagnosis Date   Anxiety    Arthritis    legs, hands   Cellulitis of right knee    started on antibiotics 06/21/15.     Depression    Family history of adverse reaction to anesthesia    sister and mom -PONV   Hypercholesteremia    Hypertension    Past Surgical History:  Procedure Laterality Date   ABDOMINAL HYSTERECTOMY     COLONOSCOPY WITH PROPOFOL N/A 06/27/2015   Procedure: COLONOSCOPY WITH PROPOFOL;  Surgeon: Lucilla Lame, MD;  Location: [facility];  Service: Endoscopy;  Laterality: N/A;   POLYPECTOMY  06/27/2015   Procedure: POLYPECTOMY;  Surgeon: Lucilla Lame, MD;  Location: [facility];  Service: Endoscopy;;   TOTAL HIP ARTHROPLASTY Right 09/10/2019   Procedure: TOTAL HIP ARTHROPLASTY ANTERIOR APPROACH;  Surgeon: Lovell Sheehan, MD;  Location: [facility];  Service: Orthopedics;  Laterality: Right;   Patient Active Problem List   Diagnosis Date Noted   Acute stroke due to ischemia ([place]) 08/10/2020   History of total hip replacement, right 09/10/2019   Special screening for malignant neoplasms, colon    Benign neoplasm of ascending colon    Benign neoplasm of descending colon     ONSET DATE: 08/10/20  REFERRING DIAG: CVA, long covid   THERAPY DIAG:  Cognitive communication deficit  Aphasia  Rationale for Evaluation and Treatment Rehabilitation  SUBJECTIVE: Pt reports  conversation with spouse went well.  PAIN:  Are you having pain? No     OBJECTIVE:   TODAY'S TREATMENT: Patient brought planner and completed pre-planning material for bible study. In preparation for bible study per clinician's request, Pt read the bible scripture, summarized main idea, and wrote potential questions to ask in bible study. Pt reported having a successful conversation with her husband about her feelings/ideas after outlining talking points with Clinician last session. Clinician prompted Pt to rank priorities for July in order to complete planner with achievable goals/most important tasks. Pt ranked priorities and completed planner with events and details with min to mod cues. Clinician suggested creating additional weekly goals to incorporate items ranked lower on priorities list. Clinician provided handouts to aid thought organization for bible study and weekly check-ins with husband.   PATIENT EDUCATION: Education details: identify priorities and schedule accordingly Person educated: Patient Education method: Explanation, Demonstration, and Verbal cues Education comprehension: verbalized understanding, returned demonstration, and needs further education   SLP Short Term Goals - 03/26/21 1247       SLP SHORT TERM GOAL #1   Title Pt will verbalize and demonstrate how to implement 2 memory and attention strategies to aid daily functioning given occasional min A over 2 sessions    Status Achieved      SLP SHORT TERM  GOAL #2   Title Pt will manage finances with use of compensations with no reported episodes of missed bills given rare min A    Status Achieved      SLP SHORT TERM GOAL #3   Title Pt will identify errors in writing and self-correct with 80% accuracy given rare min A over 2 sessions    Status Achieved      SLP SHORT TERM GOAL #4   Title Pt will participate in further language assessment as needed (goals to be added)    Status Achieved               SLP Long Term Goals - 05/20/21 1149       SLP LONG TERM GOAL #1   Title Pt will verbalize and implement 4 attention and memory strategies to aid daily functioning for ADLs and IADLs with rare min A over 2 sessions    Time 12    Period Weeks    Status On-going    Target Date 08/18/21      SLP LONG TERM GOAL #2   Title Pt will identify and correct paraphasias in conversation and writing with 90% accuracy given rare min A over 2 sessions    Time 12    Period Weeks    Status Achieved      SLP LONG TERM GOAL #3   Title Pt will use compensations for thought organization effectively in 20 minutes complex conversation.    Baseline mod complex conv achieved 5/3, goal revised    Time 12    Period Weeks    Status Revised    Target Date 08/18/21      SLP LONG TERM GOAL #4   Title Patient will maintain alternating attention between 2 mod complex tasks for 15 minutes with modified independence (strategies allowed).    Time 12    Period Weeks    Status On-going    Target Date 08/18/21      SLP LONG TERM GOAL #5   Title Patient will use strategies and executive function aid to preplan and manage IADLs independently.    Time 12    Period Weeks    Status On-going    Target Date 08/18/21                Plan - 07/15/21 1344     Clinical Impression Statement Patient presents with mild cognitive communication impairment. Pt reported benefit from using visual/written aids for thought organization in preparation for verbal communication about complex topics. Pt noted to be overwhelmed with completing additional weekly tasks (going to the gym, cooking) and agreed to reduce pressure by setting simple/one goal a week gradually increasing goals. Pt also demonstrated improved communication by discussing her needs with spouse to ask for additional assistance with household chores. Pt expected to continue improving and carrying over compensations and maintaining daily routines. Skilled ST remains  necessary for carryover of compensatory strategies and to maximize pt's cognitive communication and language skills to improve accuracy/independence with ADLs and IADLs, reduce communication breakdowns, and improve quality of life.    Speech Therapy Frequency 1x /week    Duration 12 weeks    Treatment/Interventions Language facilitation;Environmental controls;SLP instruction and feedback;Cognitive reorganization;Compensatory strategies;Patient/family education;Internal/external aids    Potential to Achieve Goals Good    SLP Home Exercise Plan see pt instructions    Consulted and Agree with Plan of Care Patient           Deneise Lever, MS,  CCC-SLP Speech-Language Pathologist ([place], [place] 07/15/2021, 1:46 PM

## 2021-07-15 NOTE — Therapy (Signed)
OUTPATIENT PHYSICAL THERAPY TREATMENT NOTE   Patient Name: Kim Gomez MRN: [id number] DOB:04/08/1955, 66 y.o., female Today's Date: 07/15/2021  PCP: Gladstone Lighter, MD REFERRING PROVIDER: Vladimir Crofts, MD   PT End of Session - 07/15/21 0805     Visit Number 25    Number of Visits 31    Date for PT Re-Evaluation 08/10/21    Authorization Time Period Recert 02/26/3660-9/47/6546    Progress Note Due on Visit 30    PT Start Time 0805    PT Stop Time 0844    PT Time Calculation (min) 39 min    Equipment Utilized During Treatment Gait belt    Activity Tolerance Patient tolerated treatment well    Behavior During Therapy WFL for tasks assessed/performed             Past Medical History:  Diagnosis Date   Anxiety    Arthritis    legs, hands   Cellulitis of right knee    started on antibiotics 06/21/15.     Depression    Family history of adverse reaction to anesthesia    sister and mom -PONV   Hypercholesteremia    Hypertension    Past Surgical History:  Procedure Laterality Date   ABDOMINAL HYSTERECTOMY     COLONOSCOPY WITH PROPOFOL N/A 06/27/2015   Procedure: COLONOSCOPY WITH PROPOFOL;  Surgeon: Lucilla Lame, MD;  Location: [place];  Service: Endoscopy;  Laterality: N/A;   POLYPECTOMY  06/27/2015   Procedure: POLYPECTOMY;  Surgeon: Lucilla Lame, MD;  Location: [place];  Service: Endoscopy;;   TOTAL HIP ARTHROPLASTY Right 09/10/2019   Procedure: TOTAL HIP ARTHROPLASTY ANTERIOR APPROACH;  Surgeon: Lovell Sheehan, MD;  Location: [facility];  Service: Orthopedics;  Laterality: Right;   Patient Active Problem List   Diagnosis Date Noted   Acute stroke due to ischemia ([place]) 08/10/2020   History of total hip replacement, right 09/10/2019   Special screening for malignant neoplasms, colon    Benign neoplasm of ascending colon    Benign neoplasm of descending colon     REFERRING DIAG: I63.9 (ICD-10-CM) - Stroke ([place])  THERAPY DIAG:  Muscle  weakness (generalized)  Stiffness of right shoulder, not elsewhere classified  Chronic right shoulder pain  Rationale for Evaluation and Treatment Rehabilitation  PERTINENT HISTORY: 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 Pt reports a fall on October 29 th. Pt reports she hurt the R UE with the fall and she reports some stiffness in the knee as well as general weakness on the right side. Pt reports she has had 2 strokes but the first went under the radar and was only evidenced by imaging presented to her by her MD. Pt reports she has been modifying her activities in order to be careful not to fall since the stroke. Reports she has been taking her time with a lot of things. Pt reports she has a membership at [employer] and she has been walking on the treadmill 2-3 times per week, she reports she walks on the treadmill for an hour at a time following working her way up. Pt reports ocassional numbness and tingling in her hands.   PRECAUTIONS: none  SUBJECTIVE: Pt reports she had some pain over the weekend following significant overuse of her right shoulder and reports the pain as soreness that has since resolved.   PAIN:  Are you having pain? Yes: NPRS scale: 0/10 Pain  location: Right shoulder  Pain description: sore Aggravating factors: prolonged positioning  Relieving factors: PT exercises      TODAY'S TREATMENT:  07/15/21   INTERVENTIONS   Manual: pt supine on plinth as PT provides the following interventions- PROM R shoulder: flexion, abduction, x multiple reps of each with UE held at end range.  -Some anterior deltoid discomfort noted with end range flexion and abduction, did not push past point of pain this date.  AP and PA GH grade 2-3 joint mobilizations x 1-2 min  GH distraction x 10 sec holds, gentle in various degrees of motion      Therex:  Pt transitions to L SL, towel roll  added under R arm 2 x 10 shoulder ER with 2 #  -fewer cues this session increased weitgt 07/15/21.   Serratus punch with 5# weight with shoulder flexion AROM from 90 to 150 holding serratus activation X 10  -minimal cues required this date for correct muscle activation.   Supine D1 flexion 2 x 10 GTB  -cues for proper muscle activation, good ROM noted  -Pt instructed in how to complete at home  independently with TB   SL R shoulder abduction GTB x 10 -improved ROM throughout, with near full ROM with sets.  pt notes it felt good  R shoulder ER with towel roll under in right axilla  2*10 with YTB   -performed with L UE against wall against wall to isolate R shoulder ER   Wall YTB wall walk x 3 -cues to keep elbow in to prevent copmensatory movement patterns.      Pt educated throughout session about proper posture and technique with exercises. Improved exercise technique, movement at target joints, use of target muscles after min to mod verbal, visual, tactile cues.   Note: Portions of this document were prepared using Dragon voice recognition software and although reviewed may contain unintentional dictation errors in syntax, grammar, or spelling.  Rationale for Evaluation and Treatment Rehabilitation       PATIENT EDUCATION: Education details: Exercise technique   Person educated: Patient Education method: Explanation Education comprehension: verbalized understanding   HOME EXERCISE PROGRAM: No changes this date    PT Short Term Goals        PT SHORT TERM GOAL #1   Title Patient will be independent in home exercise program to improve strength/mobility for better functional independence with ADLs.    Baseline no HEP at this time    Time 4    Period Weeks    Status Achieved    Target Date 02/17/21      PT SHORT TERM GOAL #2   Title Patient will be independent in progressive shoulder home exercise program in order to improve her shoulder strength and function     Baseline Patient has initial home exercise program and is comfortable with that but it is yet to be progressed for higher level activities    Time 4    Period Weeks    Status New    Target Date 04/20/21              PT Long Term Goals        PT LONG TERM GOAL #1   Title Patient will increase FOTO score to equal to or greater than  50   to demonstrate statistically significant improvement in mobility and quality of life.    Baseline 41 on 01/20/21    Time 8    Period Weeks  Status Achieved    Target Date 03/17/21      PT LONG TERM GOAL #2   Title Pt will improve mini BEST test by 4 points or more in order to indicate clinically significant improvent in balance.    Baseline see flowsheets 27 on 2/27    Time 12    Period Weeks    Status Achieved    Target Date 04/14/21      PT LONG TERM GOAL #3   Title Patient  will complete five times sit to stand test in < 15 seconds indicating an increased LE strength and improved balance.    Baseline 13.9 sec on 2/27    Time 12    Period Weeks    Status Achieved    Target Date 04/14/21      PT LONG TERM GOAL #4   Title Patient will increase 10 meter walk test to >1.72ms as to improve gait speed for better community ambulation and to reduce fall risk.    Baseline .86 m/s on 01/20/21 2/27: 1.27 m/s    Time 8    Period Weeks    Status Achieved    Target Date 03/17/21      PT LONG TERM GOAL #5   Title Pty will improve dual task TUG to less than 20 seconds in order to indicate improved cognitive and motor tasks.    Baseline 28.82 on 01/20/21 14.98 sec on 2/27    Time 12    Period Weeks    Status Achieved    Target Date 04/14/21      Additional Long Term Goals   Additional Long Term Goals Yes      PT LONG TERM GOAL #6   Title Patient will improve right shoulder by 1 increment on manual muscle test grading scale in order to indicate improved right upper extremity strength.    Baseline 3-/5 her right shoulder flexion and  abduction(due to range of motion restriction) 3+ with external rotation at 0 degrees abduction; 05/18/2021=Access Code: [email or web address] URL: [email or web address]    Time 12    Period Weeks    Status Partially Met    Target Date 08/10/21      PT LONG TERM GOAL #7   Title Patient will improve QuickDASH score by 12.5 points in order to indicate significantly improved subjective rating of right shoulder function    Baseline 50% on 03/23/2021; 05/18/2021= 23%    Time 8    Period Weeks    Status Achieved    Target Date 05/18/21      PT LONG TERM GOAL #8   Title Patient will improve right shoulder active range of motion by 30 degrees with elevation and by 15 degrees with external rotation in order to indicate improved shoulder function for overhead activities and for self-care activities    Baseline 90 degrees flexion range of motion , 85 degrees abduction range of motion, 25 degrees external rotation range of motion ( all AROM); 05/18/2021=R Shoulder AROM: Flex= 158 deg; ABD= 152; ER= 44    Time 8    Period Weeks    Status Achieved    Target Date 05/18/21      PT LONG TERM GOAL  #9   TITLE Patient will improve right shoulder passive range of motion by 30 degrees of elevation by 10 degrees with external rotation in order to indicate improved right shoulder range of motion for progressive functional activities    Baseline 120 degrees flexion, 123 degrees abduction,  50 degrees external rotation at 45 degrees abduction on 03/23/2021; 05/18/2021=R Shoulder PROM: Flex= 164 deg; ABD= 160; ER= 52    Time 12    Period Weeks    Status Partially Met    Target Date 08/10/21      PT LONG TERM GOAL  #10   TITLE Patient will improve right shoulder active range of motion by 15 degrees with elevation and by 10 degrees with external rotation in order to indicate improved shoulder function for overhead activities including reaching for 2nd and 3rd shelf in kitchen cabinet and for self-care activities    Baseline  05/18/2021=R Shoulder AROM: Flex= 158 deg; ABD= 152; ER= 44 and Patient reports difficulty reaching overhead onto 2nd and 3rd shelf at home    Time 12    Period Weeks    Status New    Target Date 08/10/21              Plan -     Clinical Impression Statement Pt presents with excellent motivation for completion of PT activities. Pt continues to demonstrate normal AROM of the right shoulder. Pt has continued difficulty with active shoulder ER with resistance. Will continue to strengthen ER in the shoulder for progression for these. Continued with pain free shoulder strengthening interventions this date with good response from patient. Pt will continue to benefit from skilled PT in otder to improve shoulder function and QOL.    Personal Factors and Comorbidities Age;Comorbidity 1;Comorbidity 2    Comorbidities arthritis, anxiety, HTN, hyperlipedemia.    Examination-Activity Limitations Carry;Lift;Locomotion Level    Examination-Participation Restrictions Cleaning;Yard Work;Meal Prep    Stability/Clinical Decision Making Stable/Uncomplicated    Rehab Potential Good    PT Frequency 1x / week    PT Duration 12 weeks    PT Treatment/Interventions ADLs/Self Care Home Management;Gait training;Therapeutic activities;Functional mobility training;Therapeutic exercise;Balance training;Neuromuscular re-education;Manual techniques;Energy conservation;Passive range of motion;Cryotherapy;Electrical Stimulation;Moist Heat;Patient/family education;Dry needling;Joint Manipulations    PT Next Visit Plan progression of HEP and shoulder strenghtening and ROM exercsies.    PT Home Exercise Plan 05/18/2021: Access Code: [email or web address]  URL: [email or web address]  Access Code: [email or web address]  URL: [email or web address]; no updates    Consulted and Agree with Plan of Care Patient               Particia Lather, PT 07/15/2021, 8:27 AM

## 2021-07-15 NOTE — Therapy (Addendum)
OUTPATIENT OCCUPATIONAL THERAPY TREATMENT NOTE   Patient Name: Kim Gomez MRN: [id number] DOB:09-Sep-1955, 66 y.o., female Today's Date: 07/15/2021  PCP: Gladstone Lighter, MD REFERRING PROVIDER: Jennings Books, MD   OT End of Session - 07/15/21 1011     Visit Number 32    Number of Visits 44    Date for OT Re-Evaluation 09/30/21    Authorization Time Period Progress report period starting 07/08/2021    OT Start Time 1005    OT Stop Time 1043    OT Time Calculation (min) 38 min    Activity Tolerance Patient tolerated treatment well    Behavior During Therapy [facility] for tasks assessed/performed             Past Medical History:  Diagnosis Date   Anxiety    Arthritis    legs, hands   Cellulitis of right knee    started on antibiotics 06/21/15.     Depression    Family history of adverse reaction to anesthesia    sister and mom -PONV   Hypercholesteremia    Hypertension    Past Surgical History:  Procedure Laterality Date   ABDOMINAL HYSTERECTOMY     COLONOSCOPY WITH PROPOFOL N/A 06/27/2015   Procedure: COLONOSCOPY WITH PROPOFOL;  Surgeon: Lucilla Lame, MD;  Location: [place];  Service: Endoscopy;  Laterality: N/A;   POLYPECTOMY  06/27/2015   Procedure: POLYPECTOMY;  Surgeon: Lucilla Lame, MD;  Location: [place];  Service: Endoscopy;;   TOTAL HIP ARTHROPLASTY Right 09/10/2019   Procedure: TOTAL HIP ARTHROPLASTY ANTERIOR APPROACH;  Surgeon: Lovell Sheehan, MD;  Location: [facility];  Service: Orthopedics;  Laterality: Right;   Patient Active Problem List   Diagnosis Date Noted   Acute stroke due to ischemia ([place]) 08/10/2020   History of total hip replacement, right 09/10/2019   Special screening for malignant neoplasms, colon    Benign neoplasm of ascending colon    Benign neoplasm of descending colon      REFERRING DIAG: CVA  THERAPY DIAG:  Muscle weakness (generalized)  Other lack of coordination  Rationale for Evaluation and Treatment  Rehabilitation  PERTINENT HISTORY: Pt. is a 66 y.o. female with PMH significant for HTN, HLD, anxiety disorder, recently hospitalized for COVID-19 infection (08/06/2020) at an outside facility. Patient was brought to the ED on 7/24 after she had a sudden onset of numbness involving right side of her body without any motor deficits, dysarthria, dysphagia. 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 Per chart for her f/u neuro visit:Left ischemic thalamic/internal capsule infarct presenting with right-sided numbness, weakness - now with residual cognitive fog (improving), imbalance (improving), fatigue, right first + second digit numbness, in patient with known vascular risk factors of hypertension, dyslipidemia, lack of physical activity, snoring  PRECAUTIONS: None  SUBJECTIVE:  Pt. Reports that her right arm is feeling better.  PAIN:  Are you having pain? Yes: {yespain:[id number]::"NPRS scale: 1/10 ,Pain location: right shoulder, Pain description: [place], Aggravating factors: Movement Relieving factors: Moist heat     OBJECTIVE:   OT TREATMENT     Neuro muscular re-education:   Pt. worked on functional RUE reaching with [facility] skills reaching up to place 1&1/2" pegs onto an elevated vertical [employer].  Pt. worked on translatory movements, moving the pegs from the tip of her fingers, to her palm, through her hand, and back out to the tip of her 2nd digit, 3rd digit, and thumb. Pt. worked on sustained  RUE reach while flipping the pegs.    Therapeutic Exercise:   Pt. performed 1.5# dowel ex. For UE strengthening secondary to weakness. Bilateral shoulder flexion, chest press, circular pattern for 1 set 10-20 reps each. Pt. worked on  right hand pinch strengthening for lateral, and 3pt. pinch using yellow, red, green, and blue resistive clips. Pt. Worked on moving the clips through her hand from the lateral pinch position to the 3pt. Clip  position. Pt. worked on placing the clips onto vertical dowels placed at the tabletop. Tactile and verbal cues were required for eliciting the desired movement. Pt. worked on 2pt. Pinch strength to remove the clips. Pt. worked on 2pt, and 3pt. Pinch strength removing  1" resistive cubes while the board is placed at a vertical angle. Pt. worked on pressing the cubes back into place while alternating isolated 2nd through 5th digit extension.    Pt. reports that her RUE pain has improved. Pt. reports having 1/10 pain in her right shoulder today. Pt. Requires visual, and verbal cues for resistive clip position in her hand, and visual cues to move the clips from the lateral pinch position to the 3pt. Pinch position. Pt. Was able to tolerate increased dowel weight. Pt. Presented with improved isolated right shoulder movements today. Pt. Continues to work on improving UE strength, and right functional hand use to improve reaching during ADLs, and IADL tasks.        PATIENT EDUCATION: Education details: RUE functioning Person educated: Patient Education method: Explanation, Demonstration, and Verbal cues Education comprehension: verbalized understanding, returned demonstration, tactile cues required, and needs further education   HOME EXERCISE PROGRAM Continue ongoing HEPs for the RUE     OT Long Term Goals - 07/08/21 1024       OT LONG TERM GOAL #1   Title Patient will be independent with home exercise program    Baseline 03/23/2021: Pt. is independent, 4/5: will continue to upgrade exercises5/03/2021: Independent 07/08/2021: Independent with HEPs    Time 12    Period Weeks    Status On-going    Target Date 09/30/21      OT LONG TERM GOAL #4   Title Patient will increase right grip strength by 10 pounds to be able to open jars and containers with modified independence    Baseline 28 pounds at eval and difficulty with managing jars and containers. 10th visit: R: 34# Jars, and containers  continue to be difficulty to open.  4/5: right grip 50# 05/20/2021: right grip strength 40# 07/08/2021: R: 49#    Time 12    Period Weeks    Status Partially Met    Target Date 09/30/21      OT LONG TERM GOAL #6   Title Patient will decrease right shoulder pain to 2 out of 10 or less with movement patterns for daily activities.    Baseline Increased pain in right shoulder, 5 out of 10 with movement. 10th visit: 8/10 right shoulder pain, 4/5:  2/10 with movement today but has increased pain other days and not yet consistent.5/03: 0/10 during movement today. 07/08/2021: 0/10 pain in the right shoulder    Time 12    Period Weeks    Status Partially Met    Target Date 09/30/21      OT LONG TERM GOAL #8   Title Will assess for potential return to work tasks as patient progresses with therapy    Baseline Patient currently unable to perform her job as outlined in her  job description    Time 12    Period Weeks    Status Deferred      OT LONG TERM GOAL  #9   TITLE Pt will improve strength to be able to reach up to place items into closets, and cabinets independently    Baseline 07/08/2021:pt. has difficulty reaching up to place items in closets, and cabinets. 05/20/2021: Pt. continues to have difficulty 4/5: unable    Time 12    Period Weeks    Status Revised    Target Date 09/30/21      OT LONG TERM GOAL  #10   TITLE When on the passenger side, pt will be able to close door with modified independence    Baseline 07/08/2021: Met 4/5:  difficulty at times    Time 6    Period Weeks    Status Achieved      OT LONG TERM GOAL  #11   TITLE Pt will demonstrate ability to vacuum carpet of 2 rooms with use of right UE and no rest breaks    Baseline 07/08/2021: Independent without rest breaks. 4/5:  difficulty with using vacuum. 5/03: Pt. has progressed and is now able to vacuum multiple rooms in her home    Time 6    Period Weeks    Status On-going    Target Date 09/30/21      OT LONG TERM GOAL   #13   TITLE Pt will improve strength to pick up 15# bag of fertilizer for yard work 5/03: Pt. continues to have difficulty    Baseline 07/08/2021: Pt. continues to have difficulty picking up a bag of fertilizer. 4/5: unable    Time 12    Period Weeks    Status On-going    Target Date 07/15/21              Plan - 07/15/21 1046     Clinical Impression Statement Pt. reports that her RUE pain has improved. Pt. reports having 1/10 pain in her right shoulder today. Pt. Requires visual, and verbal cues for resistive clip position in her hand, and visual cues to move the clips from the lateral pinch position to the 3pt. Pinch position. Pt. Was able to tolerate increased dowel weight.?? Pt. Presented with improved isolated right shoulder movements today. Pt. Continues to work on improving UE strength, and right functional hand use to improve reaching during ADLs, and IADL tasks.      ?    OT Occupational Profile and History Problem Focused Assessment - Including review of records relating to presenting problem    Occupational performance deficits (Please refer to evaluation for details): ADL's;IADL's;Social Participation    Body Structure / Function / Physical Skills ADL;Dexterity;ROM;Strength;Coordination;FMC;IADL;Pain;Sensation;UE functional use;Decreased knowledge of use of DME    Cognitive Skills Memory;Safety Awareness    Psychosocial Skills Environmental  Adaptations;Habits;Routines and Behaviors    Rehab Potential Good    Clinical Decision Making Several treatment options, min-mod task modification necessary    Comorbidities Affecting Occupational Performance: May have comorbidities impacting occupational performance    Modification or Assistance to Complete Evaluation  No modification of tasks or assist necessary to complete eval    OT Frequency 2x / week    OT Duration 12 weeks    OT Treatment/Interventions Self-care/ADL training;Cryotherapy;Paraffin;Therapeutic exercise;DME and/or AE  instruction;Cognitive remediation/compensation;Neuromuscular education;Manual Therapy;Moist Heat;Contrast Bath;Therapeutic activities;Patient/family education    Consulted and Agree with Plan of Care Patient  Harrel Carina, MS, OTR/L   Harrel Carina, OT 07/15/2021, 10:48 AM

## 2021-07-20 ENCOUNTER — Encounter: Payer: No Typology Code available for payment source | Admitting: Speech Pathology

## 2021-07-20 ENCOUNTER — Ambulatory Visit: Payer: No Typology Code available for payment source | Admitting: Physical Therapy

## 2021-07-20 ENCOUNTER — Ambulatory Visit: Payer: No Typology Code available for payment source | Attending: Neurology

## 2021-07-20 DIAGNOSIS — M25611 Stiffness of right shoulder, not elsewhere classified: Secondary | ICD-10-CM | POA: Insufficient documentation

## 2021-07-20 DIAGNOSIS — G8929 Other chronic pain: Secondary | ICD-10-CM | POA: Diagnosis present

## 2021-07-20 DIAGNOSIS — R278 Other lack of coordination: Secondary | ICD-10-CM | POA: Insufficient documentation

## 2021-07-20 DIAGNOSIS — M25511 Pain in right shoulder: Secondary | ICD-10-CM | POA: Diagnosis present

## 2021-07-20 DIAGNOSIS — R41841 Cognitive communication deficit: Secondary | ICD-10-CM | POA: Insufficient documentation

## 2021-07-20 DIAGNOSIS — M6281 Muscle weakness (generalized): Secondary | ICD-10-CM | POA: Diagnosis present

## 2021-07-20 NOTE — Therapy (Signed)
OUTPATIENT OCCUPATIONAL THERAPY TREATMENT NOTE   Patient Name: Kim Gomez MRN: [id number] DOB:1955/05/12, 66 y.o., female Today's Date: 07/20/2021  PCP: Gladstone Lighter, MD REFERRING PROVIDER: Jennings Books, MD   OT End of Session - 07/20/21 [id number]     Visit Number 33    Number of Visits 38    Date for OT Re-Evaluation 09/30/21    Authorization Time Period Progress report period starting 07/08/2021    OT Start Time 0915    OT Stop Time 1000    OT Time Calculation (min) 45 min    Activity Tolerance Patient tolerated treatment well    Behavior During Therapy [facility] for tasks assessed/performed             Past Medical History:  Diagnosis Date   Anxiety    Arthritis    legs, hands   Cellulitis of right knee    started on antibiotics 06/21/15.     Depression    Family history of adverse reaction to anesthesia    sister and mom -PONV   Hypercholesteremia    Hypertension    Past Surgical History:  Procedure Laterality Date   ABDOMINAL HYSTERECTOMY     COLONOSCOPY WITH PROPOFOL N/A 06/27/2015   Procedure: COLONOSCOPY WITH PROPOFOL;  Surgeon: Lucilla Lame, MD;  Location: [place];  Service: Endoscopy;  Laterality: N/A;   POLYPECTOMY  06/27/2015   Procedure: POLYPECTOMY;  Surgeon: Lucilla Lame, MD;  Location: [place];  Service: Endoscopy;;   TOTAL HIP ARTHROPLASTY Right 09/10/2019   Procedure: TOTAL HIP ARTHROPLASTY ANTERIOR APPROACH;  Surgeon: Lovell Sheehan, MD;  Location: [facility];  Service: Orthopedics;  Laterality: Right;   Patient Active Problem List   Diagnosis Date Noted   Acute stroke due to ischemia ([place]) 08/10/2020   History of total hip replacement, right 09/10/2019   Special screening for malignant neoplasms, colon    Benign neoplasm of ascending colon    Benign neoplasm of descending colon      REFERRING DIAG: CVA  THERAPY DIAG:  Muscle weakness (generalized)  Other lack of coordination  Rationale for Evaluation and Treatment  Rehabilitation  PERTINENT HISTORY: Pt. is a 66 y.o. female with PMH significant for HTN, HLD, anxiety disorder, recently hospitalized for COVID-19 infection (08/06/2020) at an outside facility. Patient was brought to the ED on 7/24 after she had a sudden onset of numbness involving right side of her body without any motor deficits, dysarthria, dysphagia. 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 Per chart for her f/u neuro visit:Left ischemic thalamic/internal capsule infarct presenting with right-sided numbness, weakness - now with residual cognitive fog (improving), imbalance (improving), fatigue, right first + second digit numbness, in patient with known vascular risk factors of hypertension, dyslipidemia, lack of physical activity, snoring  PRECAUTIONS: None  SUBJECTIVE: "My fingers are still a little numb."  PAIN:  Are you having pain? Yes: {yespain:27235::"NPRS scale: 1/10 ,Pain location: R shoulder/upper arm, Pain description: achy, Aggravating factors: Movement Relieving factors: Moist heat, rest, gentle stretch     OBJECTIVE:   OT TREATMENT     Neuro muscular re-education: Pt picked up 1/2"-1" washers from tabletop and placed over vertical dowels to target small item pick up using a 3 point pinch and reaching toward a target.  Progressed to rotating washers 360* within fingertips before placement over dowels and able to complete without dropping.  OT repositioned placement of vertical dowel to facilitate forward, contralateral and ipsilateral reaching with  RUE at shoulder level.    Therapeutic Exercise:   Pt. performed 1.5# dowel ex. For UE strengthening secondary to weakness. Bilateral shoulder flexion climbing dowel x2 sets 5 reps, and horiz abd, abd, and ER to top of head x10 each.  Pt cued for performing exercises within pain free range. Pt. worked on  right hand pinch strengthening for lateral, and 3pt. pinch using  yellow, red, green, and blue resistive clips. Pt. Worked on moving the clips through her hand from the lateral pinch position to the 3pt. Clip position. Pt. worked on placing the clips onto horiz dowels placed at the tabletop level. Verbal cues were required for eliciting the desired movement.     Pt. reports that her RUE pain has improved. Pt. reports having 1/10 pain in her right shoulder today. Pt. Requires visual, and verbal cues for resistive clip position in her hand, and visual cues to move the clips from the lateral pinch position to the 3pt. Pinch position.  Good tolerance to 1.5# dowel weight today.  Pt requires rest breaks between exercises d/t R hand and R shoulder fatigue.  Pt continues to present with improved isolated right shoulder movements today.  Pt did well picking up washers from table top without non-skid surface.  Pt continues to work on improving UE strength, and right functional hand use to improve reaching during ADLs, and IADL tasks.       PATIENT EDUCATION: Education details: RUE functioning Person educated: Patient Education method: Explanation, Demonstration, and Verbal cues Education comprehension: verbalized understanding, returned demonstration, tactile cues required, and needs further education   HOME EXERCISE PROGRAM Continue ongoing HEPs for the RUE     OT Long Term Goals -       OT LONG TERM GOAL #1   Title Patient will be independent with home exercise program    Baseline 03/23/2021: Pt. is independent, 4/5: will continue to upgrade exercises5/03/2021: Independent 07/08/2021: Independent with HEPs    Time 12    Period Weeks    Status On-going    Target Date 09/30/21      OT LONG TERM GOAL #4   Title Patient will increase right grip strength by 10 pounds to be able to open jars and containers with modified independence    Baseline 28 pounds at eval and difficulty with managing jars and containers. 10th visit: R: 34# Jars, and containers continue to  be difficulty to open.  4/5: right grip 50# 05/20/2021: right grip strength 40# 07/08/2021: R: 49#    Time 12    Period Weeks    Status Partially Met    Target Date 09/30/21      OT LONG TERM GOAL #6   Title Patient will decrease right shoulder pain to 2 out of 10 or less with movement patterns for daily activities.    Baseline Increased pain in right shoulder, 5 out of 10 with movement. 10th visit: 8/10 right shoulder pain, 4/5:  2/10 with movement today but has increased pain other days and not yet consistent.5/03: 0/10 during movement today. 07/08/2021: 0/10 pain in the right shoulder    Time 12    Period Weeks    Status Partially Met    Target Date 09/30/21      OT LONG TERM GOAL #8   Title Will assess for potential return to work tasks as patient progresses with therapy    Baseline Patient currently unable to perform her job as outlined in her job description  Time 12    Period Weeks    Status Deferred      OT LONG TERM GOAL  #9   TITLE Pt will improve strength to be able to reach up to place items into closets, and cabinets independently    Baseline 07/08/2021:pt. has difficulty reaching up to place items in closets, and cabinets. 05/20/2021: Pt. continues to have difficulty 4/5: unable    Time 12    Period Weeks    Status Revised    Target Date 09/30/21      OT LONG TERM GOAL  #10   TITLE When on the passenger side, pt will be able to close door with modified independence    Baseline 07/08/2021: Met 4/5:  difficulty at times    Time 6    Period Weeks    Status Achieved      OT LONG TERM GOAL  #11   TITLE Pt will demonstrate ability to vacuum carpet of 2 rooms with use of right UE and no rest breaks    Baseline 07/08/2021: Independent without rest breaks. 4/5:  difficulty with using vacuum. 5/03: Pt. has progressed and is now able to vacuum multiple rooms in her home    Time 6    Period Weeks    Status On-going    Target Date 09/30/21      OT LONG TERM GOAL  #13   TITLE  Pt will improve strength to pick up 15# bag of fertilizer for yard work 5/03: Pt. continues to have difficulty    Baseline 07/08/2021: Pt. continues to have difficulty picking up a bag of fertilizer. 4/5: unable    Time 12    Period Weeks    Status On-going    Target Date 07/15/21              Plan -     Clinical Impression Statement Pt. reports that her RUE pain has improved. Pt. reports having 1/10 pain in her right shoulder today. Pt. Requires visual, and verbal cues for resistive clip position in her hand, and visual cues to move the clips from the lateral pinch position to the 3pt. Pinch position.  Good tolerance to 1.5# dowel weight today.  Pt requires rest breaks between exercises d/t R hand and R shoulder fatigue.  Pt continues to present with improved isolated right shoulder movements today.  Pt did well picking up washers from table top without non-skid surface.  Pt continues to work on improving UE strength, and right functional hand use to improve reaching during ADLs, and IADL tasks.    OT Occupational Profile and History Problem Focused Assessment - Including review of records relating to presenting problem    Occupational performance deficits (Please refer to evaluation for details): ADL's;IADL's;Social Participation    Body Structure / Function / Physical Skills ADL;Dexterity;ROM;Strength;Coordination;FMC;IADL;Pain;Sensation;UE functional use;Decreased knowledge of use of DME    Cognitive Skills Memory;Safety Awareness    Psychosocial Skills Environmental  Adaptations;Habits;Routines and Behaviors    Rehab Potential Good    Clinical Decision Making Several treatment options, min-mod task modification necessary    Comorbidities Affecting Occupational Performance: May have comorbidities impacting occupational performance    Modification or Assistance to Complete Evaluation  No modification of tasks or assist necessary to complete eval    OT Frequency 2x / week    OT Duration 12  weeks    OT Treatment/Interventions Self-care/ADL training;Cryotherapy;Paraffin;Therapeutic exercise;DME and/or AE instruction;Cognitive remediation/compensation;Neuromuscular education;Manual Therapy;Moist Heat;Contrast Bath;Therapeutic activities;Patient/family education    Consulted  and Agree with Plan of Care Patient            Leta Speller, MS, OTR/L   Darleene Cleaver, OT 07/20/2021, 10:13 AM

## 2021-07-22 ENCOUNTER — Ambulatory Visit: Payer: No Typology Code available for payment source | Admitting: Speech Pathology

## 2021-07-22 ENCOUNTER — Ambulatory Visit: Payer: No Typology Code available for payment source

## 2021-07-22 ENCOUNTER — Ambulatory Visit: Payer: No Typology Code available for payment source | Admitting: Occupational Therapy

## 2021-07-27 ENCOUNTER — Encounter: Payer: Self-pay | Admitting: Occupational Therapy

## 2021-07-27 ENCOUNTER — Ambulatory Visit: Payer: No Typology Code available for payment source | Admitting: Physical Therapy

## 2021-07-27 ENCOUNTER — Ambulatory Visit: Payer: No Typology Code available for payment source | Admitting: Speech Pathology

## 2021-07-27 ENCOUNTER — Ambulatory Visit: Payer: No Typology Code available for payment source | Admitting: Occupational Therapy

## 2021-07-27 DIAGNOSIS — R278 Other lack of coordination: Secondary | ICD-10-CM

## 2021-07-27 DIAGNOSIS — M6281 Muscle weakness (generalized): Secondary | ICD-10-CM

## 2021-07-27 NOTE — Therapy (Addendum)
OUTPATIENT OCCUPATIONAL THERAPY TREATMENT NOTE   Patient Name: Kim Gomez MRN: [id number] DOB:24-May-1955, 66 y.o., female Today's Date: 07/27/2021  PCP: Gladstone Lighter, MD REFERRING PROVIDER: Jennings Books, MD   OT End of Session - 07/27/21 0925     Visit Number 34    Number of Visits 36    Date for OT Re-Evaluation 09/30/21    Authorization Time Period Progress report period starting 07/08/2021    OT Start Time 0918    OT Stop Time 1000    OT Time Calculation (min) 42 min    Activity Tolerance Patient tolerated treatment well    Behavior During Therapy [facility] for tasks assessed/performed             Past Medical History:  Diagnosis Date   Anxiety    Arthritis    legs, hands   Cellulitis of right knee    started on antibiotics 06/21/15.     Depression    Family history of adverse reaction to anesthesia    sister and mom -PONV   Hypercholesteremia    Hypertension    Past Surgical History:  Procedure Laterality Date   ABDOMINAL HYSTERECTOMY     COLONOSCOPY WITH PROPOFOL N/A 06/27/2015   Procedure: COLONOSCOPY WITH PROPOFOL;  Surgeon: Lucilla Lame, MD;  Location: [place];  Service: Endoscopy;  Laterality: N/A;   POLYPECTOMY  06/27/2015   Procedure: POLYPECTOMY;  Surgeon: Lucilla Lame, MD;  Location: [place];  Service: Endoscopy;;   TOTAL HIP ARTHROPLASTY Right 09/10/2019   Procedure: TOTAL HIP ARTHROPLASTY ANTERIOR APPROACH;  Surgeon: Lovell Sheehan, MD;  Location: [facility];  Service: Orthopedics;  Laterality: Right;   Patient Active Problem List   Diagnosis Date Noted   Acute stroke due to ischemia ([place]) 08/10/2020   History of total hip replacement, right 09/10/2019   Special screening for malignant neoplasms, colon    Benign neoplasm of ascending colon    Benign neoplasm of descending colon      REFERRING DIAG: CVA  THERAPY DIAG:  Muscle weakness (generalized)  Other lack of coordination  Rationale for Evaluation and Treatment  Rehabilitation  PERTINENT HISTORY: Pt. is a 66 y.o. female with PMH significant for HTN, HLD, anxiety disorder, recently hospitalized for COVID-19 infection (08/06/2020) at an outside facility. Patient was brought to the ED on 7/24 after she had a sudden onset of numbness involving right side of her body without any motor deficits, dysarthria, dysphagia. 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 Per chart for her f/u neuro visit:Left ischemic thalamic/internal capsule infarct presenting with right-sided numbness, weakness - now with residual cognitive fog (improving), imbalance (improving), fatigue, right first + second digit numbness, in patient with known vascular risk factors of hypertension, dyslipidemia, lack of physical activity, snoring  PRECAUTIONS: None  SUBJECTIVE:  Pt. Reports that her right arm is feeling better.  PAIN:  Are you having pain? Yes: {yespain:27235::"NPRS scale: 1/10 ,Pain location: right shoulder, Pain description: [place], Aggravating factors: Movement Relieving factors: Moist heat     OBJECTIVE:   OT TREATMENT     Neuro muscular re-education:   Pt. worked on grasping, and manipulating 1/2" washers from a magnetic dish using point grasp pattern. Pt. worked on reaching up, stabilizing, and sustaining shoulder elevation while placing the washer over a small precise target on vertical dowels positioned at various angles.     Therapeutic Exercise:   Pt. performed AROM for right shoulder flexion, abduction following moist  heat modality. Pt. performed 1.5# dowel ex. For UE strengthening secondary to weakness. Bilateral shoulder flexion, chest press, circular pattern for 1 set 10-20 reps each.    Pt. reports that her RUE pain continues to  improve overall. Pt. reports having 1/10 pain in her right shoulder today.  Pt.presents with 1/10 pain in her right shoulder with ROM above 95 degrees.. Pt. Was able to  tolerate increased dowel weight. Pt. Presented with improved isolated right shoulder movements today. Pt. Continues to work on improving UE strength, and right functional hand use to improve reaching during ADLs, and IADL tasks.        PATIENT EDUCATION: Education details: RUE functioning Person educated: Patient Education method: Explanation, Demonstration, and Verbal cues Education comprehension: verbalized understanding, returned demonstration, tactile cues required, and needs further education   HOME EXERCISE PROGRAM Continue ongoing HEPs for the RUE     OT Long Term Goals - 07/08/21 1024       OT LONG TERM GOAL #1   Title Patient will be independent with home exercise program    Baseline 03/23/2021: Pt. is independent, 4/5: will continue to upgrade exercises5/03/2021: Independent 07/08/2021: Independent with HEPs    Time 12    Period Weeks    Status On-going    Target Date 09/30/21      OT LONG TERM GOAL #4   Title Patient will increase right grip strength by 10 pounds to be able to open jars and containers with modified independence    Baseline 28 pounds at eval and difficulty with managing jars and containers. 10th visit: R: 34# Jars, and containers continue to be difficulty to open.  4/5: right grip 50# 05/20/2021: right grip strength 40# 07/08/2021: R: 49#    Time 12    Period Weeks    Status Partially Met    Target Date 09/30/21      OT LONG TERM GOAL #6   Title Patient will decrease right shoulder pain to 2 out of 10 or less with movement patterns for daily activities.    Baseline Increased pain in right shoulder, 5 out of 10 with movement. 10th visit: 8/10 right shoulder pain, 4/5:  2/10 with movement today but has increased pain other days and not yet consistent.5/03: 0/10 during movement today. 07/08/2021: 0/10 pain in the right shoulder    Time 12    Period Weeks    Status Partially Met    Target Date 09/30/21      OT LONG TERM GOAL #8   Title Will assess for  potential return to work tasks as patient progresses with therapy    Baseline Patient currently unable to perform her job as outlined in her job description    Time 12    Period Weeks    Status Deferred      OT [place]ertile  #9   TITLE Pt will improve strength to be able to reach up to place items into closets, and cabinets independently    Baseline 07/08/2021:pt. has difficulty reaching up to place items in closets, and cabinets. 05/20/2021: Pt. continues to have difficulty 4/5: unable    Time 12    Period Weeks    Status Revised    Target Date 09/30/21      OT LONG TERM GOAL  #10   TITLE When on the passenger side, pt will be able to close door with modified independence    Baseline 07/08/2021: Met 4/5:  difficulty at times  Time 6    Period Weeks    Status Achieved      OT LONG TERM GOAL  #11   TITLE Pt will demonstrate ability to vacuum carpet of 2 rooms with use of right UE and no rest breaks    Baseline 07/08/2021: Independent without rest breaks. 4/5:  difficulty with using vacuum. 5/03: Pt. has progressed and is now able to vacuum multiple rooms in her home    Time 6    Period Weeks    Status On-going    Target Date 09/30/21      OT LONG TERM GOAL  #13   TITLE Pt will improve strength to pick up 15# bag of fertilizer for yard work 5/03: Pt. continues to have difficulty    Baseline 07/08/2021: Pt. continues to have difficulty picking up a bag of fertilizer. 4/5: unable    Time 12    Period Weeks    Status On-going    Target Date 07/15/21                Plan - 07/27/21 1258     Clinical Impression Statement Pt. reports that her RUE pain continues to  improve overall. Pt. reports having 1/10 pain in her right shoulder today.  Pt.presents with 1/10 pain in her right shoulder with ROM above 95 degrees.. Pt. Was able to tolerate increased dowel weight. Pt. Presented with improved isolated right shoulder movements today. Pt. Continues to work on improving UE strength,  and right functional hand use to improve reaching during ADLs, and IADL tasks.      OT Occupational Profile and History Problem Focused Assessment - Including review of records relating to presenting problem    Body Structure / Function / Physical Skills ADL;Dexterity;ROM;Strength;Coordination;FMC;IADL;Pain;Sensation;UE functional use;Decreased knowledge of use of DME    Cognitive Skills Memory;Safety Awareness    Psychosocial Skills Environmental  Adaptations;Habits;Routines and Behaviors    Rehab Potential Good    Clinical Decision Making Several treatment options, min-mod task modification necessary    Comorbidities Affecting Occupational Performance: May have comorbidities impacting occupational performance    Modification or Assistance to Complete Evaluation  No modification of tasks or assist necessary to complete eval    OT Frequency 2x / week    OT Duration 12 weeks    OT Treatment/Interventions Self-care/ADL training;Cryotherapy;Paraffin;Therapeutic exercise;DME and/or AE instruction;Cognitive remediation/compensation;Neuromuscular education;Manual Therapy;Moist Heat;Contrast Bath;Therapeutic activities;Patient/family education    Consulted and Agree with Plan of Care Patient              Harrel Carina, MS, OTR/L   Harrel Carina, OT 07/27/2021, 12:58 PM

## 2021-07-29 ENCOUNTER — Ambulatory Visit: Payer: No Typology Code available for payment source | Admitting: Speech Pathology

## 2021-07-29 ENCOUNTER — Ambulatory Visit: Payer: No Typology Code available for payment source

## 2021-07-29 ENCOUNTER — Encounter: Payer: Self-pay | Admitting: Occupational Therapy

## 2021-07-29 ENCOUNTER — Ambulatory Visit: Payer: No Typology Code available for payment source | Admitting: Occupational Therapy

## 2021-07-29 DIAGNOSIS — M25611 Stiffness of right shoulder, not elsewhere classified: Secondary | ICD-10-CM

## 2021-07-29 DIAGNOSIS — M6281 Muscle weakness (generalized): Secondary | ICD-10-CM

## 2021-07-29 DIAGNOSIS — R41841 Cognitive communication deficit: Secondary | ICD-10-CM

## 2021-07-29 DIAGNOSIS — R278 Other lack of coordination: Secondary | ICD-10-CM

## 2021-07-29 DIAGNOSIS — G8929 Other chronic pain: Secondary | ICD-10-CM

## 2021-07-29 NOTE — Therapy (Addendum)
OUTPATIENT OCCUPATIONAL THERAPY TREATMENT NOTE   Patient Name: Kim Gomez MRN: [id number] DOB:05/10/1955, 66 y.o., female Today's Date: 07/29/2021  PCP: Gladstone Lighter, MD REFERRING PROVIDER: Jennings Books, MD   OT End of Session - 07/29/21 1023     Visit Number 35    Number of Visits 34    Date for OT Re-Evaluation 09/30/21    Authorization Time Period Progress report period starting 07/08/2021    OT Start Time 1004    OT Stop Time 1045    OT Time Calculation (min) 41 min    Activity Tolerance Patient tolerated treatment well    Behavior During Therapy [facility] for tasks assessed/performed             Past Medical History:  Diagnosis Date   Anxiety    Arthritis    legs, hands   Cellulitis of right knee    started on antibiotics 06/21/15.     Depression    Family history of adverse reaction to anesthesia    sister and mom -PONV   Hypercholesteremia    Hypertension    Past Surgical History:  Procedure Laterality Date   ABDOMINAL HYSTERECTOMY     COLONOSCOPY WITH PROPOFOL N/A 06/27/2015   Procedure: COLONOSCOPY WITH PROPOFOL;  Surgeon: Lucilla Lame, MD;  Location: [place];  Service: Endoscopy;  Laterality: N/A;   POLYPECTOMY  06/27/2015   Procedure: POLYPECTOMY;  Surgeon: Lucilla Lame, MD;  Location: [place];  Service: Endoscopy;;   TOTAL HIP ARTHROPLASTY Right 09/10/2019   Procedure: TOTAL HIP ARTHROPLASTY ANTERIOR APPROACH;  Surgeon: Lovell Sheehan, MD;  Location: [facility];  Service: Orthopedics;  Laterality: Right;   Patient Active Problem List   Diagnosis Date Noted   Acute stroke due to ischemia ([place]) 08/10/2020   History of total hip replacement, right 09/10/2019   Special screening for malignant neoplasms, colon    Benign neoplasm of ascending colon    Benign neoplasm of descending colon      REFERRING DIAG: CVA  THERAPY DIAG:  Muscle weakness (generalized)  Other lack of coordination  Rationale for Evaluation and Treatment  Rehabilitation  PERTINENT HISTORY: Pt. is a 66 y.o. female with PMH significant for HTN, HLD, anxiety disorder, recently hospitalized for COVID-19 infection (08/06/2020) at an outside facility. Patient was brought to the ED on 7/24 after she had a sudden onset of numbness involving right side of her body without any motor deficits, dysarthria, dysphagia. 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 Per chart for her f/u neuro visit:Left ischemic thalamic/internal capsule infarct presenting with right-sided numbness, weakness - now with residual cognitive fog (improving), imbalance (improving), fatigue, right first + second digit numbness, in patient with known vascular risk factors of hypertension, dyslipidemia, lack of physical activity, snoring  PRECAUTIONS: None  SUBJECTIVE:  Pt. reports that her right arm is feeling better.  PAIN:  Are you having pain? Yes: {yes pain: NPRS scale: 2/10 ,Pain location: right shoulder, Pain description: [place], Aggravating factors: Movement      OBJECTIVE:   OT TREATMENT     Neuro muscular re-education:   Pt. worked on grasping, and manipulating 1/2" washers from a magnetic dish using point grasp pattern. Pt. worked on reaching up, stabilizing, and sustaining shoulder elevation while placing the washer over a small precise target on vertical dowels positioned at various angles.  Pt. worked on removing the washers alternating thumb opposition to the tip of the 2nd through 4th digits.  Pt. then worked on grasping, the washers and moving them through her hand with her vision occluded.    Therapeutic Exercise:   Pt. reports having worked with her right shoulder in PT this morning. 2# dumbbell ex. for right elbow flexion and extension, forearm supination/pronation, wrist flexion/extension, and radial deviation  for 1 set 10 reps each. Pt. requires rest breaks and verbal cues for proper technique.      Pt. reports that her RUE pain continues to improve overall. Pt. reports having 2/10 pain in her right shoulder when working on reaching up to remove the washers. Pt. was able to tolerate increased dowel weight. Pt. presented with improved isolated right shoulder movements today. Pt. was able to perform thumb opposition to the 4th digit. Pt. reports numbness in the right thumb, 2nd, 3rd, and 4th digits. Pt. was able to initiate performing [facility] skills grasping items with vision occluded. Pt. Continues to work on improving UE strength, and right functional hand use to improve reaching during ADLs, and IADL tasks    PATIENT EDUCATION: Education details: RUE functioning Person educated: Patient Education method: Consulting civil engineer, Demonstration, and Verbal cues Education comprehension: verbalized understanding, returned demonstration, tactile cues required, and needs further education   HOME EXERCISE PROGRAM Continue ongoing HEPs for the RUE     OT Long Term Goals - 07/08/21 1024       OT LONG TERM GOAL #1   Title Patient will be independent with home exercise program    Baseline 03/23/2021: Pt. is independent, 4/5: will continue to upgrade exercises5/03/2021: Independent 07/08/2021: Independent with HEPs    Time 12    Period Weeks    Status On-going    Target Date 09/30/21      OT LONG TERM GOAL #4   Title Patient will increase right grip strength by 10 pounds to be able to open jars and containers with modified independence    Baseline 28 pounds at eval and difficulty with managing jars and containers. 10th visit: R: 34# Jars, and containers continue to be difficulty to open.  4/5: right grip 50# 05/20/2021: right grip strength 40# 07/08/2021: R: 49#    Time 12    Period Weeks    Status Partially Met    Target Date 09/30/21      OT LONG TERM GOAL #6   Title Patient will decrease right shoulder pain to 2 out of 10 or less with movement patterns for daily activities.    Baseline Increased pain  in right shoulder, 5 out of 10 with movement. 10th visit: 8/10 right shoulder pain, 4/5:  2/10 with movement today but has increased pain other days and not yet consistent.5/03: 0/10 during movement today. 07/08/2021: 0/10 pain in the right shoulder    Time 12    Period Weeks    Status Partially Met    Target Date 09/30/21      OT LONG TERM GOAL #8   Title Will assess for potential return to work tasks as patient progresses with therapy    Baseline Patient currently unable to perform her job as outlined in her job description    Time 12    Period Weeks    Status Deferred      OT [place]  #9   TITLE Pt will improve strength to be able to reach up to place items into closets, and cabinets independently    Baseline 07/08/2021:pt. has difficulty reaching up to place items in closets, and cabinets. 05/20/2021: Pt. continues  to have difficulty 4/5: unable    Time 12    Period Weeks    Status Revised    Target Date 09/30/21      OT LONG TERM GOAL  #10   TITLE When on the passenger side, pt will be able to close door with modified independence    Baseline 07/08/2021: Met 4/5:  difficulty at times    Time 6    Period Weeks    Status Achieved      OT LONG TERM GOAL  #11   TITLE Pt will demonstrate ability to vacuum carpet of 2 rooms with use of right UE and no rest breaks    Baseline 07/08/2021: Independent without rest breaks. 4/5:  difficulty with using vacuum. 5/03: Pt. has progressed and is now able to vacuum multiple rooms in her home    Time 6    Period Weeks    Status On-going    Target Date 09/30/21      OT LONG TERM GOAL  #13   TITLE Pt will improve strength to pick up 15# bag of fertilizer for yard work 5/03: Pt. continues to have difficulty    Baseline 07/08/2021: Pt. continues to have difficulty picking up a bag of fertilizer. 4/5: unable    Time 12    Period Weeks    Status On-going    Target Date 07/15/21                Plan - 07/27/21 1258     Clinical  Impression Statement Pt. reports that her RUE pain continues to improve overall. Pt. reports having 2/10 pain in her right shoulder when working on reaching up to remove the washers. Pt. was able to tolerate increased dowel weight. Pt. presented with improved isolated right shoulder movements today. Pt. was able to perform thumb opposition to the 4th digit. Pt. reports numbness in the right thumb, 2nd, 3rd, and 4th digits. Pt. was able to initiate performing [facility] skills grasping items with vision occluded. Pt. Continues to work on improving UE strength, and right functional hand use to improve reaching during ADLs, and IADL tasks.       OT Occupational Profile and History Problem Focused Assessment - Including review of records relating to presenting problem    Body Structure / Function / Physical Skills ADL;Dexterity;ROM;Strength;Coordination;FMC;IADL;Pain;Sensation;UE functional use;Decreased knowledge of use of DME    Cognitive Skills Memory;Safety Awareness    Psychosocial Skills Environmental  Adaptations;Habits;Routines and Behaviors    Rehab Potential Good    Clinical Decision Making Several treatment options, min-mod task modification necessary    Comorbidities Affecting Occupational Performance: May have comorbidities impacting occupational performance    Modification or Assistance to Complete Evaluation  No modification of tasks or assist necessary to complete eval    OT Frequency 2x / week    OT Duration 12 weeks    OT Treatment/Interventions Self-care/ADL training;Cryotherapy;Paraffin;Therapeutic exercise;DME and/or AE instruction;Cognitive remediation/compensation;Neuromuscular education;Manual Therapy;Moist Heat;Contrast Bath;Therapeutic activities;Patient/family education    Consulted and Agree with Plan of Care Patient              Harrel Carina, MS, OTR/L   Harrel Carina, OT 07/29/2021, 10:29 AM

## 2021-07-29 NOTE — Therapy (Signed)
OUTPATIENT PHYSICAL THERAPY TREATMENT NOTE   Patient Name: Kim Gomez MRN: [id number] DOB:1956-01-02, 66 y.o., female Today's Date: 07/29/2021  PCP: Gladstone Lighter, MD REFERRING PROVIDER: Vladimir Crofts, MD   PT End of Session - 07/29/21 0759     Visit Number 26    Number of Visits 31    Date for PT Re-Evaluation 08/10/21    Authorization Time Period Recert 09/22/1738-08/31/4816    Progress Note Due on Visit 30    PT Start Time 0804    PT Stop Time 0845    PT Time Calculation (min) 41 min    Equipment Utilized During Treatment Gait belt    Activity Tolerance Patient tolerated treatment well;No increased pain    Behavior During Therapy WFL for tasks assessed/performed              Past Medical History:  Diagnosis Date   Anxiety    Arthritis    legs, hands   Cellulitis of right knee    started on antibiotics 06/21/15.     Depression    Family history of adverse reaction to anesthesia    sister and mom -PONV   Hypercholesteremia    Hypertension    Past Surgical History:  Procedure Laterality Date   ABDOMINAL HYSTERECTOMY     COLONOSCOPY WITH PROPOFOL N/A 06/27/2015   Procedure: COLONOSCOPY WITH PROPOFOL;  Surgeon: Lucilla Lame, MD;  Location: [place];  Service: Endoscopy;  Laterality: N/A;   POLYPECTOMY  06/27/2015   Procedure: POLYPECTOMY;  Surgeon: Lucilla Lame, MD;  Location: [place];  Service: Endoscopy;;   TOTAL HIP ARTHROPLASTY Right 09/10/2019   Procedure: TOTAL HIP ARTHROPLASTY ANTERIOR APPROACH;  Surgeon: Lovell Sheehan, MD;  Location: [facility];  Service: Orthopedics;  Laterality: Right;   Patient Active Problem List   Diagnosis Date Noted   Acute stroke due to ischemia ([place]) 08/10/2020   History of total hip replacement, right 09/10/2019   Special screening for malignant neoplasms, colon    Benign neoplasm of ascending colon    Benign neoplasm of descending colon     REFERRING DIAG: I63.9 (ICD-10-CM) - Stroke  ([place])  THERAPY DIAG:  Muscle weakness (generalized)  Other lack of coordination  Stiffness of right shoulder, not elsewhere classified  Chronic right shoulder pain  Rationale for Evaluation and Treatment Rehabilitation  PERTINENT HISTORY: 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 Pt reports a fall on October 29 th. Pt reports she hurt the R UE with the fall and she reports some stiffness in the knee as well as general weakness on the right side. Pt reports she has had 2 strokes but the first went under the radar and was only evidenced by imaging presented to her by her MD. Pt reports she has been modifying her activities in order to be careful not to fall since the stroke. Reports she has been taking her time with a lot of things. Pt reports she has a membership at [employer] and she has been walking on the treadmill 2-3 times per week, she reports she walks on the treadmill for an hour at a time following working her way up. Pt reports ocassional numbness and tingling in her hands.   PRECAUTIONS: none  SUBJECTIVE: Pt was lifting coolers recently for cookout and reports her shoulder wasn't quite ready for that. Went to physician regarding R shoulder and reports was placed back on Meloxicam. Pt reports  is doing better but "still have work to do". Pt states she is doing HEP when she is not here and feels like it is helping.   PAIN:  Are you having pain? Yes: NPRS scale: 1/10 Pain location: Right anterior shoulder  Pain description: "stiff" Aggravating factors: prolonged positioning  Relieving factors: PT exercises      TODAY'S TREATMENT:  07/29/21   INTERVENTIONS   Manual:  Pt supine on plinth as SPT provides the following interventions: PROM R shoulder: flexion, abduction, & ER x multiple reps of each with UE held at end range.  AP GH grade 2-3 joint mobilizations x 1-2 min GH distraction x  10 sec holds, gentle in various degrees of motion   Pt reports no pain in R shoulder following manual.     Therex:  Supine D1 flexion: 2 x 12, GTB  - cues for proper sequencing, rates as medium  Serratus punch with 4# weight with shoulder flexion AROM from 90 to 150 holding serratus activation: 10x - VC required initially for technique/sequencing  Serratus punch with perturbations: 6 x 30 sec, good ability to maintain position noted  Pt transitions to L SL, towel roll added under R arm: 2 x 12 shoulder ER with 2 #  -TC/VC required initially for technique, decreased ROM noted as muscle fatigues, challenging for pt  R shoulder abduction: 2 x 10, GTB - noted good control with movement   Pt reports no pain, however, states muscles are fatigued following session. SPT instructed pt to monitor soreness levels today and tomorrow following progression of interventions today.     PATIENT EDUCATION: Education details: Exercise technique, Economist Person educated: Patient Education method: Explanation, Demonstration, Tactile cues, and Verbal cues Education comprehension: verbalized understanding, returned demonstration, and verbal cues required   HOME EXERCISE PROGRAM: No changes as of 07/29/21   PT Short Term Goals        PT SHORT TERM GOAL #1   Title Patient will be independent in home exercise program to improve strength/mobility for better functional independence with ADLs.    Baseline no HEP at this time    Time 4    Period Weeks    Status Achieved    Target Date 02/17/21      PT SHORT TERM GOAL #2   Title Patient will be independent in progressive shoulder home exercise program in order to improve her shoulder strength and function    Baseline Patient has initial home exercise program and is comfortable with that but it is yet to be progressed for higher level activities    Time 4    Period Weeks    Status New    Target Date 04/20/21              PT Long  Term Goals        PT LONG TERM GOAL #1   Title Patient will increase FOTO score to equal to or greater than  50   to demonstrate statistically significant improvement in mobility and quality of life.    Baseline 41 on 01/20/21    Time 8    Period Weeks    Status Achieved    Target Date 03/17/21      PT LONG TERM GOAL #2   Title Pt will improve mini BEST test by 4 points or more in order to indicate clinically significant improvent in balance.    Baseline see flowsheets 27 on 2/27    Time 12  Period Weeks    Status Achieved    Target Date 04/14/21      PT LONG TERM GOAL #3   Title Patient  will complete five times sit to stand test in < 15 seconds indicating an increased LE strength and improved balance.    Baseline 13.9 sec on 2/27    Time 12    Period Weeks    Status Achieved    Target Date 04/14/21      PT LONG TERM GOAL #4   Title Patient will increase 10 meter walk test to >1.63ms as to improve gait speed for better community ambulation and to reduce fall risk.    Baseline .86 m/s on 01/20/21 2/27: 1.27 m/s    Time 8    Period Weeks    Status Achieved    Target Date 03/17/21      PT LONG TERM GOAL #5   Title Pty will improve dual task TUG to less than 20 seconds in order to indicate improved cognitive and motor tasks.    Baseline 28.82 on 01/20/21 14.98 sec on 2/27    Time 12    Period Weeks    Status Achieved    Target Date 04/14/21      Additional Long Term Goals   Additional Long Term Goals Yes      PT LONG TERM GOAL #6   Title Patient will improve right shoulder by 1 increment on manual muscle test grading scale in order to indicate improved right upper extremity strength.    Baseline 3-/5 her right shoulder flexion and abduction(due to range of motion restriction) 3+ with external rotation at 0 degrees abduction; 05/18/2021=Access Code: [email or web address] URL: [email or web address]    Time 12    Period Weeks    Status Partially Met    Target Date 08/10/21       PT LONG TERM GOAL #7   Title Patient will improve QuickDASH score by 12.5 points in order to indicate significantly improved subjective rating of right shoulder function    Baseline 50% on 03/23/2021; 05/18/2021= 23%    Time 8    Period Weeks    Status Achieved    Target Date 05/18/21      PT LONG TERM GOAL #8   Title Patient will improve right shoulder active range of motion by 30 degrees with elevation and by 15 degrees with external rotation in order to indicate improved shoulder function for overhead activities and for self-care activities    Baseline 90 degrees flexion range of motion , 85 degrees abduction range of motion, 25 degrees external rotation range of motion ( all AROM); 05/18/2021=R Shoulder AROM: Flex= 158 deg; ABD= 152; ER= 44    Time 8    Period Weeks    Status Achieved    Target Date 05/18/21      PT LONG TERM GOAL  #9   TITLE Patient will improve right shoulder passive range of motion by 30 degrees of elevation by 10 degrees with external rotation in order to indicate improved right shoulder range of motion for progressive functional activities    Baseline 120 degrees flexion, 123 degrees abduction, 50 degrees external rotation at 45 degrees abduction on 03/23/2021; 05/18/2021=R Shoulder PROM: Flex= 164 deg; ABD= 160; ER= 52    Time 12    Period Weeks    Status Partially Met    Target Date 08/10/21      PT LONG TERM GOAL  #10   TITLE Patient will  improve right shoulder active range of motion by 15 degrees with elevation and by 10 degrees with external rotation in order to indicate improved shoulder function for overhead activities including reaching for 2nd and 3rd shelf in kitchen cabinet and for self-care activities    Baseline 05/18/2021=R Shoulder AROM: Flex= 158 deg; ABD= 152; ER= 44 and Patient reports difficulty reaching overhead onto 2nd and 3rd shelf at home    Time 12    Period Weeks    Status New    Target Date 08/10/21              Plan -      Clinical Impression Statement Continued with current plan, focusing on R shoulder. Pt tolerate manual therapy well with reports of no pain in R shoulder following. Pt continues to demonstrate difficulties with active ER in R shoulder with resistance. Pt able to maintain shoulder positioning when perturbations are provided by SPT well, with no compensation noted or pain reported. Pt will continue to benefit from skilled PT in order to improve shoulder function and QOL.    Personal Factors and Comorbidities Age;Comorbidity 1;Comorbidity 2    Comorbidities arthritis, anxiety, HTN, hyperlipedemia.    Examination-Activity Limitations Carry;Lift;Locomotion Level    Examination-Participation Restrictions Cleaning;Yard Work;Meal Prep    Stability/Clinical Decision Making Stable/Uncomplicated    Rehab Potential Good    PT Frequency 1x / week    PT Duration 12 weeks    PT Treatment/Interventions ADLs/Self Care Home Management;Gait training;Therapeutic activities;Functional mobility training;Therapeutic exercise;Balance training;Neuromuscular re-education;Manual techniques;Energy conservation;Passive range of motion;Cryotherapy;Electrical Stimulation;Moist Heat;Patient/family education;Dry needling;Joint Manipulations    PT Next Visit Plan progression of HEP and shoulder strenghtening and ROM exercises, continue POC   PT Home Exercise Plan 05/18/2021: Access Code: [email or web address]  URL: [email or web address]  Access [email or web address]: [email or web address]  URL: [email or web address]; no updates    Consulted and Agree with Plan of Care Patient             Izola Price, SPT  This entire session was performed under direct supervision and direction of a licensed therapist. I have personally read, edited and approve of the note as written. Ricard Dillon PT, DPT   Zollie Pee, PT 07/29/2021, 9:23 AM

## 2021-07-29 NOTE — Patient Instructions (Addendum)
Homework: Review and email three recipes. Preplan for Bible study.

## 2021-07-30 NOTE — Therapy (Signed)
OUTPATIENT SPEECH LANGUAGE PATHOLOGY TREATMENT AND PROGRESS NOTE   Patient Name: Kim Gomez MRN: [id number] DOB:[birth year], 66 y.o., female Today's Date: 07/30/2021  PCP: Gladstone Lighter, MD REFERRING PROVIDER: Jennings Books, MD  Speech Therapy Progress Note  Dates of Reporting Period: 04/22/2021 to 07/29/2021  Objective: Patient has been seen for 10 speech therapy sessions this reporting period targeting cognitive communication deficits. Patient is making progress toward LTGs; anticipate goals will be met and d/c in 4-6 sessions. See skilled intervention, clinical impressions, and goals below for details.     End of Session - 07/30/21 0840     Visit Number 30    Number of Visits 37    Date for SLP Re-Evaluation 08/18/21    Authorization - Visit Number 10    Progress Note Due on Visit 10    SLP Start Time 0900    SLP Stop Time  1000    SLP Time Calculation (min) 60 min    Activity Tolerance Patient tolerated treatment well             Past Medical History:  Diagnosis Date   Anxiety    Arthritis    legs, hands   Cellulitis of right knee    started on antibiotics 06/21/15.     Depression    Family history of adverse reaction to anesthesia    sister and mom -PONV   Hypercholesteremia    Hypertension    Past Surgical History:  Procedure Laterality Date   ABDOMINAL HYSTERECTOMY     COLONOSCOPY WITH PROPOFOL N/A 06/27/2015   Procedure: COLONOSCOPY WITH PROPOFOL;  Surgeon: Lucilla Lame, MD;  Location: [place];  Service: Endoscopy;  Laterality: N/A;   POLYPECTOMY  06/27/2015   Procedure: POLYPECTOMY;  Surgeon: Lucilla Lame, MD;  Location: [place];  Service: Endoscopy;;   TOTAL HIP ARTHROPLASTY Right 09/10/2019   Procedure: TOTAL HIP ARTHROPLASTY ANTERIOR APPROACH;  Surgeon: Lovell Sheehan, MD;  Location: [facility];  Service: Orthopedics;  Laterality: Right;   Patient Active Problem List   Diagnosis Date Noted   Acute stroke due to ischemia ([place])  08/10/2020   History of total hip replacement, right 09/10/2019   Special screening for malignant neoplasms, colon    Benign neoplasm of ascending colon    Benign neoplasm of descending colon     ONSET DATE: 08/10/20  REFERRING DIAG: CVA, long covid   THERAPY DIAG:  Cognitive communication deficit  Rationale for Evaluation and Treatment Rehabilitation  SUBJECTIVE: Pt reports conversation with spouse went well.  PAIN:  Are you having pain? No     OBJECTIVE:   TODAY'S TREATMENT: Patient forgot planner today but reports she continues to use to track appointments. She cooked 3 days last week. Pt used conversation handout to preplan for weekly talks with spouse, and reports this has gone very well for her. She has not had Bible study since previous visit, but plans to attend this evening. Required cues to remember to use her handout for pre-planning; pt wrote sticky note to remind her to complete this. SLP worked with pt to identify strategies for alternating attention by discussing tasks where pt notices breakdowns in attention (interruptions when packing or cooking, household task). Pt required min-mod cues to problem solve and identify strategies she could use to improve her function. Reviewed progress today and discussed remaining goals and probable d/c timeline. Pt given tasks for homework (provided in pt instructions).  PATIENT EDUCATION: Education details: strategies for handling interruptions Person educated: Patient  Education method: Explanation, Demonstration, and Verbal cues Education comprehension: verbalized understanding, returned demonstration, and needs further education   SLP Short Term Goals - 07/30/21 0843       SLP SHORT TERM GOAL #1   Title Pt will verbalize and demonstrate how to implement 2 memory and attention strategies to aid daily functioning given occasional min A over 2 sessions    Status Achieved      SLP SHORT TERM GOAL #2   Title Pt will manage  finances with use of compensations with no reported episodes of missed bills given rare min A    Status Achieved      SLP SHORT TERM GOAL #3   Title Pt will identify errors in writing and self-correct with 80% accuracy given rare min A over 2 sessions    Status Achieved      SLP SHORT TERM GOAL #4   Title Pt will participate in further language assessment as needed (goals to be added)    Status Achieved    LTGs = STGs          SLP Long Term Goals - 07/30/21 0843       SLP LONG TERM GOAL #1   Title Pt will verbalize and implement 4 attention and memory strategies to aid daily functioning for ADLs and IADLs with rare min A over 2 sessions    Baseline 07/29/21: occasional min-mod cues    Time 12    Period Weeks    Status On-going    Target Date 08/18/21      SLP LONG TERM GOAL #2   Title Pt will identify and correct paraphasias in conversation and writing with 90% accuracy given rare min A over 2 sessions    Time 12    Period Weeks    Status Achieved      SLP LONG TERM GOAL #3   Title Pt will use compensations for thought organization effectively in 20 minutes complex conversation.    Baseline mod complex conv achieved 5/3, goal revised 07/29/21: Having weekly planned conversations about feelings with spouse, cues for consistency necessary for Bible study planning    Time 12    Period Weeks    Status On-going    Target Date 08/18/21      SLP LONG TERM GOAL #4   Title Patient will maintain alternating attention between 2 mod complex tasks for 15 minutes with modified independence (strategies allowed).    Baseline 07/29/21 min-mod cues necessary to use strategies    Time 12    Period Weeks    Status On-going    Target Date 08/18/21      SLP LONG TERM GOAL #5   Title Patient will use strategies and executive function aid to preplan and manage IADLs independently.    Baseline 07/29/21: using planner to manage appointments; requires min-mod cues to preplan for meals    Time  12    Period Weeks    Status On-going    Target Date 08/18/21                Plan - 07/15/21 1344     Clinical Impression Statement Patient continues with mild cognitive communication impairment. Pt reports carryover of strategy to preplan for weekly conversations with spouse. Continues to experience difficulty with alternating attention in day to day tasks; she demonstrates ability to identify potential strategies with min-mod cues however cues remain necessary to anticipate needs. She is progressing toward LTGs and is expected to meet  these with in next 4-6 sessions. Skilled ST remains necessary for carryover of compensatory strategies and to maximize pt's cognitive communication and language skills to improve accuracy/independence with ADLs and IADLs, reduce communication breakdowns, and improve quality of life.     Speech Therapy Frequency 1x /week    Duration 12 weeks    Treatment/Interventions Language facilitation;Environmental controls;SLP instruction and feedback;Cognitive reorganization;Compensatory strategies;Patient/family education;Internal/external aids    Potential to Achieve Goals Good    SLP Home Exercise Plan see pt instructions    Consulted and Agree with Plan of Care Patient           Deneise Lever, MS, CCC-SLP Speech-Language Pathologist ([place], [place] 07/30/2021, 8:49 AM

## 2021-08-03 ENCOUNTER — Ambulatory Visit: Payer: No Typology Code available for payment source | Admitting: Physical Therapy

## 2021-08-03 ENCOUNTER — Ambulatory Visit: Payer: No Typology Code available for payment source | Admitting: Speech Pathology

## 2021-08-03 ENCOUNTER — Ambulatory Visit: Payer: No Typology Code available for payment source | Admitting: Occupational Therapy

## 2021-08-03 ENCOUNTER — Ambulatory Visit (INDEPENDENT_AMBULATORY_CARE_PROVIDER_SITE_OTHER): Payer: No Typology Code available for payment source

## 2021-08-03 DIAGNOSIS — M6281 Muscle weakness (generalized): Secondary | ICD-10-CM

## 2021-08-03 DIAGNOSIS — I639 Cerebral infarction, unspecified: Secondary | ICD-10-CM | POA: Diagnosis not present

## 2021-08-03 NOTE — Therapy (Addendum)
OUTPATIENT OCCUPATIONAL THERAPY TREATMENT NOTE   Patient Name: Kim Gomez MRN: [id number] DOB:[birth year], 66 y.o., female Today's Date: 08/03/2021  PCP: Gladstone Lighter, MD REFERRING PROVIDER: Jennings Books, MD   OT End of Session - 08/03/21 [phone]     Visit Number 36    Number of Visits 77    Date for OT Re-Evaluation 09/30/21    Authorization Time Period Progress report period starting 07/08/2021    OT Start Time 0915    OT Stop Time 1000    OT Time Calculation (min) 45 min    Activity Tolerance Patient tolerated treatment well    Behavior During Therapy [facility] for tasks assessed/performed             Past Medical History:  Diagnosis Date   Anxiety    Arthritis    legs, hands   Cellulitis of right knee    started on antibiotics 06/21/15.     Depression    Family history of adverse reaction to anesthesia    sister and mom -PONV   Hypercholesteremia    Hypertension    Past Surgical History:  Procedure Laterality Date   ABDOMINAL HYSTERECTOMY     COLONOSCOPY WITH PROPOFOL N/A 06/27/2015   Procedure: COLONOSCOPY WITH PROPOFOL;  Surgeon: Lucilla Lame, MD;  Location: [place];  Service: Endoscopy;  Laterality: N/A;   POLYPECTOMY  06/27/2015   Procedure: POLYPECTOMY;  Surgeon: Lucilla Lame, MD;  Location: [place];  Service: Endoscopy;;   TOTAL HIP ARTHROPLASTY Right 09/10/2019   Procedure: TOTAL HIP ARTHROPLASTY ANTERIOR APPROACH;  Surgeon: Lovell Sheehan, MD;  Location: [facility];  Service: Orthopedics;  Laterality: Right;   Patient Active Problem List   Diagnosis Date Noted   Acute stroke due to ischemia ([place]) 08/10/2020   History of total hip replacement, right 09/10/2019   Special screening for malignant neoplasms, colon    Benign neoplasm of ascending colon    Benign neoplasm of descending colon      REFERRING DIAG: CVA  THERAPY DIAG:  Muscle weakness (generalized)  Rationale for Evaluation and Treatment Rehabilitation  PERTINENT  HISTORY: Pt. is a 66 y.o. female with PMH significant for HTN, HLD, anxiety disorder, recently hospitalized for COVID-19 infection (08/06/2020) at an outside facility. Patient was brought to the ED on 7/24 after she had a sudden onset of numbness involving right side of her body without any motor deficits, dysarthria, dysphagia. 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 Per chart for her f/u neuro visit:Left ischemic thalamic/internal capsule infarct presenting with right-sided numbness, weakness - now with residual cognitive fog (improving), imbalance (improving), fatigue, right first + second digit numbness, in patient with known vascular risk factors of hypertension, dyslipidemia, lack of physical activity, snoring  PRECAUTIONS: None  SUBJECTIVE:  Pt. reports having had a very busy weekend full of birthday, and graduation parties.  PAIN:  Are you having pain? Yes: {yes pain: NPRS scale: 1/10, Pain location: right shoulder, Pain description: [place], Aggravating factors: Movement      OBJECTIVE:   OT TREATMENT     Neuro muscular re-education:   Pt. worked on right [facility] skills using the Building services engineer Task. Pt. worked on sustaining grasp on the resistive tweezers while grasping 1" sticks, and moving the sticks from a horizontal position to a vertical position in preparation for placing them into the pegboard. The pegboard was positioned at multiple elevated vertical angles to encourage [facility] while while the UE  is elevated in shoulder flexion, and abduction. Pt. required verbal cues, and cues for visual demonstration for wrist position, and hand pattern when placing them into the pegboard. Pt. worked on removing the 1" sticks using thumb opposition to the tip of her 2nd through 5th digits.    Therapeutic Exercise:  Pt. performed 1.5# dowel ex. For UE strengthening secondary to weakness. Bilateral shoulder flexion, chest press,  and circular patterns were performed.    Pt. reports that her RUE pain continues to improve overall. Pt. Has progressed to 1/10 pain in her right shoulder today. Pt. responded well to moist heat  modality, and strengthening to the right shoulder. Pt. dropped multiple pegs from the tweezers when reaching up to place them into the the vertical pegboard. Pt. was able to perform thumb opposition to the 5th digit. However, requires increased time to perform thumb opposition to the 4th, and 5th digits. Pt. continues to present with numbness in the right thumb, 2nd, 3rd, and 4th digits. Pt. continues to work on improving UE strength, and right functional hand use to improve reaching during ADLs, and IADL tasks.    PATIENT EDUCATION: Education details: RUE functioning Person educated: Patient Education method: Explanation, Demonstration, and Verbal cues Education comprehension: verbalized understanding, returned demonstration, tactile cues required, and needs further education   HOME EXERCISE PROGRAM Continue ongoing HEPs for the RUE     OT Long Term Goals - 07/08/21 1024       OT LONG TERM GOAL #1   Title Patient will be independent with home exercise program    Baseline 03/23/2021: Pt. is independent, 4/5: will continue to upgrade exercises5/03/2021: Independent 07/08/2021: Independent with HEPs    Time 12    Period Weeks    Status On-going    Target Date 09/30/21      OT LONG TERM GOAL #4   Title Patient will increase right grip strength by 10 pounds to be able to open jars and containers with modified independence    Baseline 28 pounds at eval and difficulty with managing jars and containers. 10th visit: R: 34# Jars, and containers continue to be difficulty to open.  4/5: right grip 50# 05/20/2021: right grip strength 40# 07/08/2021: R: 49#    Time 12    Period Weeks    Status Partially Met    Target Date 09/30/21      OT LONG TERM GOAL #6   Title Patient will decrease right shoulder  pain to 2 out of 10 or less with movement patterns for daily activities.    Baseline Increased pain in right shoulder, 5 out of 10 with movement. 10th visit: 8/10 right shoulder pain, 4/5:  2/10 with movement today but has increased pain other days and not yet consistent.5/03: 0/10 during movement today. 07/08/2021: 0/10 pain in the right shoulder    Time 12    Period Weeks    Status Partially Met    Target Date 09/30/21      OT LONG TERM GOAL #8   Title Will assess for potential return to work tasks as patient progresses with therapy    Baseline Patient currently unable to perform her job as outlined in her job description    Time 12    Period Weeks    Status Deferred      OT [place]  #9   TITLE Pt will improve strength to be able to reach up to place items into closets, and cabinets independently  Baseline 07/08/2021:pt. has difficulty reaching up to place items in closets, and cabinets. 05/20/2021: Pt. continues to have difficulty 4/5: unable    Time 12    Period Weeks    Status Revised    Target Date 09/30/21      OT LONG TERM GOAL  #10   TITLE When on the passenger side, pt will be able to close door with modified independence    Baseline 07/08/2021: Met 4/5:  difficulty at times    Time 6    Period Weeks    Status Achieved      OT LONG TERM GOAL  #11   TITLE Pt will demonstrate ability to vacuum carpet of 2 rooms with use of right UE and no rest breaks    Baseline 07/08/2021: Independent without rest breaks. 4/5:  difficulty with using vacuum. 5/03: Pt. has progressed and is now able to vacuum multiple rooms in her home    Time 6    Period Weeks    Status On-going    Target Date 09/30/21      OT LONG TERM GOAL  #13   TITLE Pt will improve strength to pick up 15# bag of fertilizer for yard work 5/03: Pt. continues to have difficulty    Baseline 07/08/2021: Pt. continues to have difficulty picking up a bag of fertilizer. 4/5: unable    Time 12    Period Weeks     Status On-going    Target Date 07/15/21                Plan - 07/27/21 1258     Clinical Impression Statement Pt. reports that her RUE pain continues to improve overall. Pt. Has progressed to 1/10 pain in her right shoulder today. Pt. responded well to moist heat  modality, and strengthening to the right shoulder. Pt. dropped multiple pegs from the tweezers when reaching up to place them into the the vertical pegboard. Pt. was able to perform thumb opposition to the 5th digit. However, requires increased time to perform thumb opposition to the 4th, and 5th digits. Pt. continues to present with numbness in the right thumb, 2nd, 3rd, and 4th digits. Pt. continues to work on improving UE strength, and right functional hand use to improve reaching during ADLs, and IADL tasks.    PATIENT EDU       OT Occupational Profile and History Problem Focused Assessment - Including review of records relating to presenting problem    Body Structure / Function / Physical Skills ADL;Dexterity;ROM;Strength;Coordination;FMC;IADL;Pain;Sensation;UE functional use;Decreased knowledge of use of DME    Cognitive Skills Memory;Safety Awareness    Psychosocial Skills Environmental  Adaptations;Habits;Routines and Behaviors    Rehab Potential Good    Clinical Decision Making Several treatment options, min-mod task modification necessary    Comorbidities Affecting Occupational Performance: May have comorbidities impacting occupational performance    Modification or Assistance to Complete Evaluation  No modification of tasks or assist necessary to complete eval    OT Frequency 2x / week    OT Duration 12 weeks    OT Treatment/Interventions Self-care/ADL training;Cryotherapy;Paraffin;Therapeutic exercise;DME and/or AE instruction;Cognitive remediation/compensation;Neuromuscular education;Manual Therapy;Moist Heat;Contrast Bath;Therapeutic activities;Patient/family education    Consulted and Agree with Plan of Care  Patient              Harrel Carina, MS, OTR/L   Harrel Carina, OT 08/03/2021, 10:05 AM

## 2021-08-05 ENCOUNTER — Ambulatory Visit: Payer: No Typology Code available for payment source

## 2021-08-05 ENCOUNTER — Encounter: Payer: Self-pay | Admitting: Occupational Therapy

## 2021-08-05 ENCOUNTER — Ambulatory Visit: Payer: No Typology Code available for payment source | Admitting: Occupational Therapy

## 2021-08-05 ENCOUNTER — Ambulatory Visit: Payer: No Typology Code available for payment source | Admitting: Speech Pathology

## 2021-08-05 DIAGNOSIS — M6281 Muscle weakness (generalized): Secondary | ICD-10-CM | POA: Diagnosis not present

## 2021-08-05 DIAGNOSIS — R278 Other lack of coordination: Secondary | ICD-10-CM

## 2021-08-05 DIAGNOSIS — M25611 Stiffness of right shoulder, not elsewhere classified: Secondary | ICD-10-CM

## 2021-08-05 DIAGNOSIS — G8929 Other chronic pain: Secondary | ICD-10-CM

## 2021-08-05 DIAGNOSIS — R41841 Cognitive communication deficit: Secondary | ICD-10-CM

## 2021-08-05 LAB — CUP PACEART REMOTE DEVICE CHECK
Date Time Interrogation Session: 20230717080411
Implantable Pulse Generator Implant Date: 20230405
Pulse Gen Serial Number: 178997

## 2021-08-05 NOTE — Therapy (Addendum)
OUTPATIENT OCCUPATIONAL THERAPY TREATMENT NOTE   Patient Name: Kim Gomez MRN: [id number] DOB:[birth year], 66 y.o., female Today's Date: 08/05/2021  PCP: Gladstone Lighter, MD REFERRING PROVIDER: Jennings Books, MD   OT End of Session - 08/05/21 1010     Visit Number 37    Number of Visits 76    Date for OT Re-Evaluation 09/30/21    Authorization Time Period Progress report period starting 07/08/2021    OT Start Time 0955    OT Stop Time 1040    OT Time Calculation (min) 45 min    Activity Tolerance Patient tolerated treatment well    Behavior During Therapy [facility] for tasks assessed/performed             Past Medical History:  Diagnosis Date   Anxiety    Arthritis    legs, hands   Cellulitis of right knee    started on antibiotics 06/21/15.     Depression    Family history of adverse reaction to anesthesia    sister and mom -PONV   Hypercholesteremia    Hypertension    Past Surgical History:  Procedure Laterality Date   ABDOMINAL HYSTERECTOMY     COLONOSCOPY WITH PROPOFOL N/A 06/27/2015   Procedure: COLONOSCOPY WITH PROPOFOL;  Surgeon: Lucilla Lame, MD;  Location: [place];  Service: Endoscopy;  Laterality: N/A;   POLYPECTOMY  06/27/2015   Procedure: POLYPECTOMY;  Surgeon: Lucilla Lame, MD;  Location: [place];  Service: Endoscopy;;   TOTAL HIP ARTHROPLASTY Right 09/10/2019   Procedure: TOTAL HIP ARTHROPLASTY ANTERIOR APPROACH;  Surgeon: Lovell Sheehan, MD;  Location: [facility];  Service: Orthopedics;  Laterality: Right;   Patient Active Problem List   Diagnosis Date Noted   Acute stroke due to ischemia ([place]) 08/10/2020   History of total hip replacement, right 09/10/2019   Special screening for malignant neoplasms, colon    Benign neoplasm of ascending colon    Benign neoplasm of descending colon      REFERRING DIAG: CVA  THERAPY DIAG:  Muscle weakness (generalized)  Other lack of coordination  Rationale for Evaluation and Treatment  Rehabilitation  PERTINENT HISTORY: Pt. is a 66 y.o. female with PMH significant for HTN, HLD, anxiety disorder, recently hospitalized for COVID-19 infection (08/06/2020) at an outside facility. Patient was brought to the ED on 7/24 after she had a sudden onset of numbness involving right side of her body without any motor deficits, dysarthria, dysphagia. 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 Per chart for her f/u neuro visit:Left ischemic thalamic/internal capsule infarct presenting with right-sided numbness, weakness - now with residual cognitive fog (improving), imbalance (improving), fatigue, right first + second digit numbness, in patient with known vascular risk factors of hypertension, dyslipidemia, lack of physical activity, snoring  PRECAUTIONS: None  SUBJECTIVE:  Pt. reports having had a very busy weekend full of birthday, and graduation parties.  PAIN:  Are you having pain? Yes: {yes pain: NPRS scale: 3/10, Pain location: right shoulder, Pain description: [place], Aggravating factors: Movement initially. Relieving factors: stretching.      OBJECTIVE:   OT TREATMENT     Neuro muscular re-education:   Pt. worked on [facility] skills grasping 1" sticks, 1/4" collars, and 1/4" washers. Pt. worked on storing the objects in the palm, and translatory skills moving the items from the palm of the hand to the tip of the 2nd digit, and thumb in preparation for constructing 3 piece towers.  Therapeutic Exercise:  Pt. performed AROM for right shoulder flexion against gravity, AROM with reaching up to move the shapes through 2 vertical dowels of progressively increasing heights. Pt. then worked on shoulder flexion against gravity using a medium incline placed at a tabletop surface. Pt. worked on [employer], and reciprocal motion using the UBE in standing for 8 min. with no resistance initially, and increased to minimal resistance.  Constant monitoring was provided.   Pt. reports that her RUE pain continues to improve overall, however has 3/10 pain in the right shoulder initially with reaching, and moving shapes. Pt. responded well to moist heat modality, AAROM stretches, and the UBE with less pain afterwards. Pt. was able to perform right hand [facility] skills, with limited dropping of the small objects from her palm. Pt. continues to work on improving UE strength, and right functional hand use to improve reaching during ADLs, and IADL tasks.    PATIENT EDUCATION: Education details: RUE functioning Person educated: Patient Education method: Explanation, Demonstration, and Verbal cues Education comprehension: verbalized understanding, returned demonstration, tactile cues required, and needs further education   HOME EXERCISE PROGRAM Continue ongoing HEPs for the RUE     OT Long Term Goals - 07/08/21 1024       OT LONG TERM GOAL #1   Title Patient will be independent with home exercise program    Baseline 03/23/2021: Pt. is independent, 4/5: will continue to upgrade exercises5/03/2021: Independent 07/08/2021: Independent with HEPs    Time 12    Period Weeks    Status On-going    Target Date 09/30/21      OT LONG TERM GOAL #4   Title Patient will increase right grip strength by 10 pounds to be able to open jars and containers with modified independence    Baseline 28 pounds at eval and difficulty with managing jars and containers. 10th visit: R: 34# Jars, and containers continue to be difficulty to open.  4/5: right grip 50# 05/20/2021: right grip strength 40# 07/08/2021: R: 49#    Time 12    Period Weeks    Status Partially Met    Target Date 09/30/21      OT LONG TERM GOAL #6   Title Patient will decrease right shoulder pain to 2 out of 10 or less with movement patterns for daily activities.    Baseline Increased pain in right shoulder, 5 out of 10 with movement. 10th visit: 8/10 right shoulder pain, 4/5:  2/10 with  movement today but has increased pain other days and not yet consistent.5/03: 0/10 during movement today. 07/08/2021: 0/10 pain in the right shoulder    Time 12    Period Weeks    Status Partially Met    Target Date 09/30/21      OT LONG TERM GOAL #8   Title Will assess for potential return to work tasks as patient progresses with therapy    Baseline Patient currently unable to perform her job as outlined in her job description    Time 12    Period Weeks    Status Deferred      OT [place]  #9   TITLE Pt will improve strength to be able to reach up to place items into closets, and cabinets independently    Baseline 07/08/2021:pt. has difficulty reaching up to place items in closets, and cabinets. 05/20/2021: Pt. continues to have difficulty 4/5: unable    Time 12    Period Weeks    Status  Revised    Target Date 09/30/21      OT LONG TERM GOAL  #10   TITLE When on the passenger side, pt will be able to close door with modified independence    Baseline 07/08/2021: Met 4/5:  difficulty at times    Time 6    Period Weeks    Status Achieved      OT LONG TERM GOAL  #11   TITLE Pt will demonstrate ability to vacuum carpet of 2 rooms with use of right UE and no rest breaks    Baseline 07/08/2021: Independent without rest breaks. 4/5:  difficulty with using vacuum. 5/03: Pt. has progressed and is now able to vacuum multiple rooms in her home    Time 6    Period Weeks    Status On-going    Target Date 09/30/21      OT LONG TERM GOAL  #13   TITLE Pt will improve strength to pick up 15# bag of fertilizer for yard work 5/03: Pt. continues to have difficulty    Baseline 07/08/2021: Pt. continues to have difficulty picking up a bag of fertilizer. 4/5: unable    Time 12    Period Weeks    Status On-going    Target Date 07/15/21                Plan - 07/27/21 1258     Clinical Impression Statement Pt. reports that her RUE pain continues to improve overall, however has 3/10 pain  in the right shoulder initially with reaching, and moving shapes. Pt. responded well to moist heat modality, AAROM stretches, and the UBE with less pain afterwards. Pt. was able to perform right hand [facility] skills, with limited dropping of the small objects from her palm. Pt. continues to work on improving UE strength, and right functional hand use to improve reaching during ADLs, and IADL tasks.           OT Occupational Profile and History Problem Focused Assessment - Including review of records relating to presenting problem    Body Structure / Function / Physical Skills ADL;Dexterity;ROM;Strength;Coordination;FMC;IADL;Pain;Sensation;UE functional use;Decreased knowledge of use of DME    Cognitive Skills Memory;Safety Awareness    Psychosocial Skills Environmental  Adaptations;Habits;Routines and Behaviors    Rehab Potential Good    Clinical Decision Making Several treatment options, min-mod task modification necessary    Comorbidities Affecting Occupational Performance: May have comorbidities impacting occupational performance    Modification or Assistance to Complete Evaluation  No modification of tasks or assist necessary to complete eval    OT Frequency 2x / week    OT Duration 12 weeks    OT Treatment/Interventions Self-care/ADL training;Cryotherapy;Paraffin;Therapeutic exercise;DME and/or AE instruction;Cognitive remediation/compensation;Neuromuscular education;Manual Therapy;Moist Heat;Contrast Bath;Therapeutic activities;Patient/family education    Consulted and Agree with Plan of Care Patient              Harrel Carina, MS, OTR/L   Harrel Carina, OT 08/05/2021, 11:01 AM

## 2021-08-05 NOTE — Therapy (Addendum)
OUTPATIENT SPEECH LANGUAGE PATHOLOGY TREATMENT NOTE   Patient Name: Kim Gomez MRN: [id number] DOB:1955/08/27, 66 y.o., female Today's Date: 08/05/2021  PCP: Gladstone Lighter, MD REFERRING PROVIDER: Jennings Books, MD     End of Session - 08/05/21 0932     Visit Number 31    Number of Visits 37    Date for SLP Re-Evaluation 08/18/21    Authorization - Visit Number 10    Progress Note Due on Visit 10    SLP Start Time 0900    SLP Stop Time  1000    SLP Time Calculation (min) 60 min    Activity Tolerance Patient tolerated treatment well             Past Medical History:  Diagnosis Date   Anxiety    Arthritis    legs, hands   Cellulitis of right knee    started on antibiotics 06/21/15.     Depression    Family history of adverse reaction to anesthesia    sister and mom -PONV   Hypercholesteremia    Hypertension    Past Surgical History:  Procedure Laterality Date   ABDOMINAL HYSTERECTOMY     COLONOSCOPY WITH PROPOFOL N/A 06/27/2015   Procedure: COLONOSCOPY WITH PROPOFOL;  Surgeon: Lucilla Lame, MD;  Location: [place];  Service: Endoscopy;  Laterality: N/A;   POLYPECTOMY  06/27/2015   Procedure: POLYPECTOMY;  Surgeon: Lucilla Lame, MD;  Location: [place];  Service: Endoscopy;;   TOTAL HIP ARTHROPLASTY Right 09/10/2019   Procedure: TOTAL HIP ARTHROPLASTY ANTERIOR APPROACH;  Surgeon: Lovell Sheehan, MD;  Location: [facility];  Service: Orthopedics;  Laterality: Right;   Patient Active Problem List   Diagnosis Date Noted   Acute stroke due to ischemia ([place]) 08/10/2020   History of total hip replacement, 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 No     OBJECTIVE:   TODAY'S TREATMENT: Patient brought all materials to session today. She used worksheets provided to preplan conversation with her spouse and to prepare for Bible study. Pt received positive feedback from Bible study leader about her participation in the session:  "She said, "I've never heard you talk this much!". Pt reported feeling positive about her week; cooked 2 items yesterday, scheduled time for the gym, and reviewed recipes at home to plan an additional meal. Pt stated goals for next week with min-mod question cues, and with min cue x1 wrote sticky notes for her grocery list, as well as with reminders for strategies to help her follow through with her goals for next week. Pt homework is to carry forward all reminders/habits and appointments to her planner for the next month.  PATIENT EDUCATION: Education details: consider using planner to schedule social activities Person educated: Patient Education method: Explanation, Demonstration, and Verbal cues Education comprehension: verbalized understanding, returned demonstration, and needs further education   SLP Short Term Goals - 07/30/21 0843       SLP SHORT TERM GOAL #1   Title Pt will verbalize and demonstrate how to implement 2 memory and attention strategies to aid daily functioning given occasional min A over 2 sessions  Status Achieved      SLP SHORT TERM GOAL #2   Title Pt will manage finances with use of compensations with no reported episodes of missed bills given rare min A    Status Achieved      SLP SHORT TERM GOAL #3   Title Pt will identify errors in writing and self-correct with 80% accuracy given rare min A over 2 sessions    Status Achieved      SLP SHORT TERM GOAL #4   Title Pt will participate in further language assessment as needed (goals to be added)    Status Achieved    LTGs = STGs          SLP Long Term Goals - 07/30/21 0843        SLP LONG TERM GOAL #1   Title Pt will verbalize and implement 4 attention and memory strategies to aid daily functioning for ADLs and IADLs with rare min A over 2 sessions    Baseline 07/29/21: occasional min-mod cues    Time 12    Period Weeks    Status On-going    Target Date 08/18/21      SLP LONG TERM GOAL #2   Title Pt will identify and correct paraphasias in conversation and writing with 90% accuracy given rare min A over 2 sessions    Time 12    Period Weeks    Status Achieved      SLP LONG TERM GOAL #3   Title Pt will use compensations for thought organization effectively in 20 minutes complex conversation.    Baseline mod complex conv achieved 5/3, goal revised 07/29/21: Having weekly planned conversations about feelings with spouse, cues for consistency necessary for Bible study planning    Time 12    Period Weeks    Status On-going    Target Date 08/18/21      SLP LONG TERM GOAL #4   Title Patient will maintain alternating attention between 2 mod complex tasks for 15 minutes with modified independence (strategies allowed).    Baseline 07/29/21 min-mod cues necessary to use strategies    Time 12    Period Weeks    Status On-going    Target Date 08/18/21      SLP LONG TERM GOAL #5   Title Patient will use strategies and executive function aid to preplan and manage IADLs independently.    Baseline 07/29/21: using planner to manage appointments; requires min-mod cues to preplan for meals    Time 12    Period Weeks    Status On-going    Target Date 08/18/21                Plan - 07/15/21 1344     Clinical Impression Statement Patient continues with mild cognitive communication impairment. Pt continues to carryover of strategy to preplan for weekly conversations with spouse, and is now consistently preplanning to increase her participation in [place] study. Pt reports functional improvements and that she is pleased with her progress this week. She is progressing toward  LTGs and is expected to meet these with in next 3-5 sessions. Skilled ST remains necessary for carryover of compensatory strategies and to maximize pt's cognitive communication and language skills to improve accuracy/independence with ADLs and IADLs, reduce communication breakdowns, and improve quality of life.     Speech Therapy Frequency 1x /week    Duration 12 weeks    Treatment/Interventions Language facilitation;Environmental controls;SLP instruction and feedback;Cognitive reorganization;Compensatory strategies;Patient/family education;Internal/external aids  Potential to Achieve Goals Good    SLP Home Exercise Plan see pt instructions    Consulted and Agree with Plan of Care Patient           Deneise Lever, MS, CCC-SLP Speech-Language Pathologist ([place], [place] 08/05/2021, 9:37 AM

## 2021-08-05 NOTE — Therapy (Signed)
OUTPATIENT PHYSICAL THERAPY TREATMENT NOTE   Patient Name: Kim Gomez MRN: [id number] DOB:Aug 09, 1955, 66 y.o., female Today's Date: 08/05/2021  PCP: Gladstone Lighter, MD REFERRING PROVIDER: Vladimir Crofts, MD   PT End of Session - 08/05/21 0756     Visit Number 27    Number of Visits 31    Date for PT Re-Evaluation 08/10/21    Authorization Time Period Recert 01/24/5788-3/83/3383    Progress Note Due on Visit 30    PT Start Time 0800    PT Stop Time 0843    PT Time Calculation (min) 43 min    Equipment Utilized During Treatment Gait belt    Activity Tolerance Patient tolerated treatment well;No increased pain    Behavior During Therapy WFL for tasks assessed/performed               Past Medical History:  Diagnosis Date   Anxiety    Arthritis    legs, hands   Cellulitis of right knee    started on antibiotics 06/21/15.     Depression    Family history of adverse reaction to anesthesia    sister and mom -PONV   Hypercholesteremia    Hypertension    Past Surgical History:  Procedure Laterality Date   ABDOMINAL HYSTERECTOMY     COLONOSCOPY WITH PROPOFOL N/A 06/27/2015   Procedure: COLONOSCOPY WITH PROPOFOL;  Surgeon: Lucilla Lame, MD;  Location: [place];  Service: Endoscopy;  Laterality: N/A;   POLYPECTOMY  06/27/2015   Procedure: POLYPECTOMY;  Surgeon: Lucilla Lame, MD;  Location: [place];  Service: Endoscopy;;   TOTAL HIP ARTHROPLASTY Right 09/10/2019   Procedure: TOTAL HIP ARTHROPLASTY ANTERIOR APPROACH;  Surgeon: Lovell Sheehan, MD;  Location: [facility];  Service: Orthopedics;  Laterality: Right;   Patient Active Problem List   Diagnosis Date Noted   Acute stroke due to ischemia ([place]) 08/10/2020   History of total hip replacement, right 09/10/2019   Special screening for malignant neoplasms, colon    Benign neoplasm of ascending colon    Benign neoplasm of descending colon     REFERRING DIAG: I63.9 (ICD-10-CM) - Stroke  ([place])  THERAPY DIAG:  Muscle weakness (generalized)  Other lack of coordination  Stiffness of right shoulder, not elsewhere classified  Chronic right shoulder pain  Rationale for Evaluation and Treatment Rehabilitation  PERTINENT HISTORY: 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 Pt reports a fall on October 29 th. Pt reports she hurt the R UE with the fall and she reports some stiffness in the knee as well as general weakness on the right side. Pt reports she has had 2 strokes but the first went under the radar and was only evidenced by imaging presented to her by her MD. Pt reports she has been modifying her activities in order to be careful not to fall since the stroke. Reports she has been taking her time with a lot of things. Pt reports she has a membership at [employer] and she has been walking on the treadmill 2-3 times per week, she reports she walks on the treadmill for an hour at a time following working her way up. Pt reports ocassional numbness and tingling in her hands.   PRECAUTIONS: none  SUBJECTIVE: Pt reports shoulder has been doing well. States her shoulder was a little sore following mopping yesterday.  PAIN:  Are you having pain? Yes: NPRS scale: 1/10 Pain location:  Right anterior shoulder  Pain description: "stiff" Aggravating factors: prolonged positioning  Relieving factors: PT exercises      TODAY'S TREATMENT:  08/05/21   INTERVENTIONS   Manual:  Pt supine on plinth as SPT provides the following interventions: PROM R shoulder: flexion, abduction, & ER x multiple reps of each with UE held at end range.  AP GH grade 2-3 joint mobilizations x 1-2 min at various degrees of motion GH distraction x 10 sec holds, gentle in various degrees of motion   Pt reports manual feels "good" on shoulder following.   Therex:  Supine AROM: shoulder flexion, abd, ER, 10x  each  Serratus punch with perturbations: 5 x 30 sec  Supine D1 flexion: 15x & 10x, GTB, reports tiring toward the end of each set  Sidelying shoulder ER: 12x without weight & 12x with 1# - continues to require cues, decreased ROM noted with weight & as muscle fatigues, still challenging for pt  Sidelying R shoulder abd: 2 x 12, GTB - pt reports "feels good"  Shoulder posterior capsule stretch: 60 sec  Seated shoulder flexion: 2 x 10, 1# - cues for decreased ROM secondary to reports of pain, pt reports no pain with reduced arc   Pt reports no pain, however, states muscles are "tired" following session. SPT reminds pt to monitor soreness levels today and tomorrow following progression of interventions today.     PATIENT EDUCATION: Education details: Exercise technique, Economist Person educated: Patient Education method: Explanation, Demonstration, Tactile cues, and Verbal cues Education comprehension: verbalized understanding, returned demonstration, and verbal cues required   HOME EXERCISE PROGRAM: No changes as of 08/05/21   PT Short Term Goals        PT SHORT TERM GOAL #1   Title Patient will be independent in home exercise program to improve strength/mobility for better functional independence with ADLs.    Baseline no HEP at this time    Time 4    Period Weeks    Status Achieved    Target Date 02/17/21      PT SHORT TERM GOAL #2   Title Patient will be independent in progressive shoulder home exercise program in order to improve her shoulder strength and function    Baseline Patient has initial home exercise program and is comfortable with that but it is yet to be progressed for higher level activities    Time 4    Period Weeks    Status New    Target Date 04/20/21              PT Long Term Goals        PT LONG TERM GOAL #1   Title Patient will increase FOTO score to equal to or greater than  50   to demonstrate statistically significant  improvement in mobility and quality of life.    Baseline 41 on 01/20/21    Time 8    Period Weeks    Status Achieved    Target Date 03/17/21      PT LONG TERM GOAL #2   Title Pt will improve mini BEST test by 4 points or more in order to indicate clinically significant improvent in balance.    Baseline see flowsheets 27 on 2/27    Time 12    Period Weeks    Status Achieved    Target Date 04/14/21      PT LONG TERM GOAL #3   Title Patient  will complete five times sit to  stand test in < 15 seconds indicating an increased LE strength and improved balance.    Baseline 13.9 sec on 2/27    Time 12    Period Weeks    Status Achieved    Target Date 04/14/21      PT LONG TERM GOAL #4   Title Patient will increase 10 meter walk test to >1.33ms as to improve gait speed for better community ambulation and to reduce fall risk.    Baseline .86 m/s on 01/20/21 2/27: 1.27 m/s    Time 8    Period Weeks    Status Achieved    Target Date 03/17/21      PT LONG TERM GOAL #5   Title Pt will improve dual task TUG to less than 20 seconds in order to indicate improved cognitive and motor tasks.    Baseline 28.82 on 01/20/21 14.98 sec on 2/27    Time 12    Period Weeks    Status Achieved    Target Date 04/14/21      Additional Long Term Goals   Additional Long Term Goals Yes      PT LONG TERM GOAL #6   Title Patient will improve right shoulder by 1 increment on manual muscle test grading scale in order to indicate improved right upper extremity strength.    Baseline 3-/5 her right shoulder flexion and abduction(due to range of motion restriction) 3+ with external rotation at 0 degrees abduction; 05/18/2021=Access Code: [email or web address] URL: [email or web address]    Time 12    Period Weeks    Status Partially Met    Target Date 08/10/21      PT LONG TERM GOAL #7   Title Patient will improve QuickDASH score by 12.5 points in order to indicate significantly improved subjective rating of right  shoulder function    Baseline 50% on 03/23/2021; 05/18/2021= 23%    Time 8    Period Weeks    Status Achieved    Target Date 05/18/21      PT LONG TERM GOAL #8   Title Patient will improve right shoulder active range of motion by 30 degrees with elevation and by 15 degrees with external rotation in order to indicate improved shoulder function for overhead activities and for self-care activities    Baseline 90 degrees flexion range of motion , 85 degrees abduction range of motion, 25 degrees external rotation range of motion ( all AROM); 05/18/2021=R Shoulder AROM: Flex= 158 deg; ABD= 152; ER= 44    Time 8    Period Weeks    Status Achieved    Target Date 05/18/21      PT LONG TERM GOAL  #9   TITLE Patient will improve right shoulder passive range of motion by 30 degrees of elevation by 10 degrees with external rotation in order to indicate improved right shoulder range of motion for progressive functional activities    Baseline 120 degrees flexion, 123 degrees abduction, 50 degrees external rotation at 45 degrees abduction on 03/23/2021; 05/18/2021=R Shoulder PROM: Flex= 164 deg; ABD= 160; ER= 52    Time 12    Period Weeks    Status Partially Met    Target Date 08/10/21      PT LONG TERM GOAL  #10   TITLE Patient will improve right shoulder active range of motion by 15 degrees with elevation and by 10 degrees with external rotation in order to indicate improved shoulder function for overhead activities including reaching for 2nd and  3rd shelf in kitchen cabinet and for self-care activities    Baseline 05/18/2021=R Shoulder AROM: Flex= 158 deg; ABD= 152; ER= 44 and Patient reports difficulty reaching overhead onto 2nd and 3rd shelf at home    Time 12    Period Weeks    Status New    Target Date 08/10/21              Plan -     Clinical Impression Statement Pt is pleasant and highly motivated for today's session. Continued focus on R shoulder mobility and strengthening. Pt tolerates manual  therapy well with reports of improvement in R shoulder following. Improvement in shoulder pain free range noted with both passive and active ROM today. Still demonstrates difficulty with active ER in R shoulder with resistance, quick to fatigue. Pt will continue to benefit from skilled PT in order to improve shoulder function and QOL.    Personal Factors and Comorbidities Age;Comorbidity 1;Comorbidity 2    Comorbidities arthritis, anxiety, HTN, hyperlipedemia.    Examination-Activity Limitations Carry;Lift;Locomotion Level    Examination-Participation Restrictions Cleaning;Yard Work;Meal Prep    Stability/Clinical Decision Making Stable/Uncomplicated    Rehab Potential Good    PT Frequency 1x / week    PT Duration 12 weeks    PT Treatment/Interventions ADLs/Self Care Home Management;Gait training;Therapeutic activities;Functional mobility training;Therapeutic exercise;Balance training;Neuromuscular re-education;Manual techniques;Energy conservation;Passive range of motion;Cryotherapy;Electrical Stimulation;Moist Heat;Patient/family education;Dry needling;Joint Manipulations    PT Next Visit Plan shoulder strenghtening & ROM exercises, continue POC   PT Home Exercise Plan 05/18/2021: Access Code: [email or web address]  URL: [email or web address]  Access [email or web address]: [email or web address]  URL: [email or web address]; no updates    Consulted and Agree with Plan of Care Patient             Izola Price, SPT This entire session was performed under direct supervision and direction of a licensed therapist. I have personally read, edited and approve of the note as written. Ricard Dillon PT, DPT   Zollie Pee, PT 08/05/2021, 11:01 AM

## 2021-08-10 ENCOUNTER — Encounter: Payer: Self-pay | Admitting: Occupational Therapy

## 2021-08-10 ENCOUNTER — Ambulatory Visit: Payer: No Typology Code available for payment source | Admitting: Speech Pathology

## 2021-08-10 ENCOUNTER — Ambulatory Visit: Payer: No Typology Code available for payment source | Admitting: Occupational Therapy

## 2021-08-10 ENCOUNTER — Ambulatory Visit: Payer: No Typology Code available for payment source | Admitting: Physical Therapy

## 2021-08-10 DIAGNOSIS — R278 Other lack of coordination: Secondary | ICD-10-CM

## 2021-08-10 DIAGNOSIS — M6281 Muscle weakness (generalized): Secondary | ICD-10-CM

## 2021-08-10 NOTE — Therapy (Addendum)
OUTPATIENT OCCUPATIONAL THERAPY TREATMENT NOTE   Patient Name: Kim Gomez MRN: [id number] DOB:December 09, 1955, 66 y.o., female Today's Date: 08/10/2021  PCP: Gladstone Lighter, MD REFERRING PROVIDER: Jennings Books, MD   OT End of Session - 08/10/21 0949     Visit Number 38    Number of Visits 18    Date for OT Re-Evaluation 09/30/21    Authorization Time Period Progress report period starting 07/08/2021    OT Start Time 0920    OT Stop Time 1000    OT Time Calculation (min) 40 min    Activity Tolerance Patient tolerated treatment well    Behavior During Therapy [facility] for tasks assessed/performed             Past Medical History:  Diagnosis Date   Anxiety    Arthritis    legs, hands   Cellulitis of right knee    started on antibiotics 06/21/15.     Depression    Family history of adverse reaction to anesthesia    sister and mom -PONV   Hypercholesteremia    Hypertension    Past Surgical History:  Procedure Laterality Date   ABDOMINAL HYSTERECTOMY     COLONOSCOPY WITH PROPOFOL N/A 06/27/2015   Procedure: COLONOSCOPY WITH PROPOFOL;  Surgeon: Lucilla Lame, MD;  Location: [place];  Service: Endoscopy;  Laterality: N/A;   POLYPECTOMY  06/27/2015   Procedure: POLYPECTOMY;  Surgeon: Lucilla Lame, MD;  Location: [place];  Service: Endoscopy;;   TOTAL HIP ARTHROPLASTY Right 09/10/2019   Procedure: TOTAL HIP ARTHROPLASTY ANTERIOR APPROACH;  Surgeon: Lovell Sheehan, MD;  Location: [facility];  Service: Orthopedics;  Laterality: Right;   Patient Active Problem List   Diagnosis Date Noted   Acute stroke due to ischemia ([place]) 08/10/2020   History of total hip replacement, right 09/10/2019   Special screening for malignant neoplasms, colon    Benign neoplasm of ascending colon    Benign neoplasm of descending colon      REFERRING DIAG: CVA  THERAPY DIAG:  Other lack of coordination  Muscle weakness (generalized)  Rationale for Evaluation and Treatment  Rehabilitation  PERTINENT HISTORY: Pt. is a 66 y.o. female with PMH significant for HTN, HLD, anxiety disorder, recently hospitalized for COVID-19 infection (08/06/2020) at an outside facility. Patient was brought to the ED on 7/24 after she had a sudden onset of numbness involving right side of her body without any motor deficits, dysarthria, dysphagia. 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 Per chart for her f/u neuro visit:Left ischemic thalamic/internal capsule infarct presenting with right-sided numbness, weakness - now with residual cognitive fog (improving), imbalance (improving), fatigue, right first + second digit numbness, in patient with known vascular risk factors of hypertension, dyslipidemia, lack of physical activity, snoring  PRECAUTIONS: None  SUBJECTIVE:  Pt. reports having had a very busy weekend full of birthday, and graduation parties.  PAIN:  Are you having pain? Yes: {yes pain: NPRS scale: 5/10, Pain location: right shoulder, Pain description: [place], Aggravating factors: Movement initially. Relieving factors: stretching.      OBJECTIVE:   OT TREATMENT     Neuro muscular re-education:   Pt. worked on [facility] skills  grasping 1/2" circular tipped pegs. Pt. worked on storing the pegs in the palm, and translatory skills moving them from the palm of the hand to the tip of the 2nd digit in preparation for placing them onto a pegboard secured to a small incline wedge  placed at the tabletop. Pt. worked on removing them alternating thumb opposition to the tip of the 2nd through 5th digits.    Therapeutic Exercise:  Pt. Performed AROM/AAROM/PROM for shoulder flexion, abduction, adduction, and horizontal shoulder adduction, and abduction.    Pt. reports that she was very sick with a sinus infection over the weekend.  Pt. reports that her RUE pain continues to improve overall, however has 5/10 pain in the right  shoulder. Pt. responded well to moist heat modality, AAROM stretches, and the UBE with less pain afterwards. Pt. was able to perform right hand [facility] skills, with dropping multiple small pegs from her finger tips. Pt. continues to work on improving UE strength, and right functional hand use to improve reaching during ADLs, and IADL tasks.    PATIENT EDUCATION: Education details: RUE functioning Person educated: Patient Education method: Explanation, Demonstration, and Verbal cues Education comprehension: verbalized understanding, returned demonstration, tactile cues required, and needs further education   HOME EXERCISE PROGRAM Continue ongoing HEPs for the RUE     OT Long Term Goals - 07/08/21 1024       OT LONG TERM GOAL #1   Title Patient will be independent with home exercise program    Baseline 03/23/2021: Pt. is independent, 4/5: will continue to upgrade exercises5/03/2021: Independent 07/08/2021: Independent with HEPs    Time 12    Period Weeks    Status On-going    Target Date 09/30/21      OT LONG TERM GOAL #4   Title Patient will increase right grip strength by 10 pounds to be able to open jars and containers with modified independence    Baseline 28 pounds at eval and difficulty with managing jars and containers. 10th visit: R: 34# Jars, and containers continue to be difficulty to open.  4/5: right grip 50# 05/20/2021: right grip strength 40# 07/08/2021: R: 49#    Time 12    Period Weeks    Status Partially Met    Target Date 09/30/21      OT LONG TERM GOAL #6   Title Patient will decrease right shoulder pain to 2 out of 10 or less with movement patterns for daily activities.    Baseline Increased pain in right shoulder, 5 out of 10 with movement. 10th visit: 8/10 right shoulder pain, 4/5:  2/10 with movement today but has increased pain other days and not yet consistent.5/03: 0/10 during movement today. 07/08/2021: 0/10 pain in the right shoulder    Time 12    Period Weeks     Status Partially Met    Target Date 09/30/21      OT LONG TERM GOAL #8   Title Will assess for potential return to work tasks as patient progresses with therapy    Baseline Patient currently unable to perform her job as outlined in her job description    Time 12    Period Weeks    Status Deferred      OT [place]  #9   TITLE Pt will improve strength to be able to reach up to place items into closets, and cabinets independently    Baseline 07/08/2021:pt. has difficulty reaching up to place items in closets, and cabinets. 05/20/2021: Pt. continues to have difficulty 4/5: unable    Time 12    Period Weeks    Status Revised    Target Date 09/30/21      OT LONG TERM GOAL  #10   TITLE When on the  passenger side, pt will be able to close door with modified independence    Baseline 07/08/2021: Met 4/5:  difficulty at times    Time 6    Period Weeks    Status Achieved      OT LONG TERM GOAL  #11   TITLE Pt will demonstrate ability to vacuum carpet of 2 rooms with use of right UE and no rest breaks    Baseline 07/08/2021: Independent without rest breaks. 4/5:  difficulty with using vacuum. 5/03: Pt. has progressed and is now able to vacuum multiple rooms in her home    Time 6    Period Weeks    Status On-going    Target Date 09/30/21      OT LONG TERM GOAL  #13   TITLE Pt will improve strength to pick up 15# bag of fertilizer for yard work 5/03: Pt. continues to have difficulty    Baseline 07/08/2021: Pt. continues to have difficulty picking up a bag of fertilizer. 4/5: unable    Time 12    Period Weeks    Status On-going    Target Date 07/15/21                Plan - 07/27/21 1258     Clinical Impression Statement Pt. reports that she was very sick with a sinus infection over the weekend.  Pt. reports that her RUE pain continues to improve overall, however has 5/10 pain in the right shoulder. Pt. responded well to moist heat modality, AAROM stretches, and the UBE with  less pain afterwards. Pt. was able to perform right hand [facility] skills, with dropping multiple small pegs from her finger tips. Pt. continues to work on improving UE strength, and right functional hand use to improve reaching during ADLs, and IADL tasks.             OT Occupational Profile and History Problem Focused Assessment - Including review of records relating to presenting problem    Body Structure / Function / Physical Skills ADL;Dexterity;ROM;Strength;Coordination;FMC;IADL;Pain;Sensation;UE functional use;Decreased knowledge of use of DME    Cognitive Skills Memory;Safety Awareness    Psychosocial Skills Environmental  Adaptations;Habits;Routines and Behaviors    Rehab Potential Good    Clinical Decision Making Several treatment options, min-mod task modification necessary    Comorbidities Affecting Occupational Performance: May have comorbidities impacting occupational performance    Modification or Assistance to Complete Evaluation  No modification of tasks or assist necessary to complete eval    OT Frequency 2x / week    OT Duration 12 weeks    OT Treatment/Interventions Self-care/ADL training;Cryotherapy;Paraffin;Therapeutic exercise;DME and/or AE instruction;Cognitive remediation/compensation;Neuromuscular education;Manual Therapy;Moist Heat;Contrast Bath;Therapeutic activities;Patient/family education    Consulted and Agree with Plan of Care Patient              Harrel Carina, MS, OTR/L   Harrel Carina, OT 08/10/2021, 10:06 AM

## 2021-08-12 ENCOUNTER — Encounter: Payer: Self-pay | Admitting: Occupational Therapy

## 2021-08-12 ENCOUNTER — Ambulatory Visit: Payer: No Typology Code available for payment source | Admitting: Occupational Therapy

## 2021-08-12 ENCOUNTER — Ambulatory Visit: Payer: No Typology Code available for payment source | Admitting: Speech Pathology

## 2021-08-12 ENCOUNTER — Ambulatory Visit: Payer: No Typology Code available for payment source

## 2021-08-12 DIAGNOSIS — R278 Other lack of coordination: Secondary | ICD-10-CM

## 2021-08-12 DIAGNOSIS — G8929 Other chronic pain: Secondary | ICD-10-CM

## 2021-08-12 DIAGNOSIS — M25611 Stiffness of right shoulder, not elsewhere classified: Secondary | ICD-10-CM

## 2021-08-12 DIAGNOSIS — M6281 Muscle weakness (generalized): Secondary | ICD-10-CM | POA: Diagnosis not present

## 2021-08-12 DIAGNOSIS — R41841 Cognitive communication deficit: Secondary | ICD-10-CM

## 2021-08-12 NOTE — Therapy (Signed)
OUTPATIENT SPEECH LANGUAGE PATHOLOGY TREATMENT NOTE   Patient Name: Kim Gomez MRN: [id number] DOB:[birth year], 66 y.o., female Today's Date: 08/12/2021  PCP: Gladstone Lighter, MD REFERRING PROVIDER: Jennings Books, MD     End of Session - 08/12/21 1308     Visit Number 32    Number of Visits 27    Date for SLP Re-Evaluation 08/18/21    Authorization - Visit Number 2    Progress Note Due on Visit 10    SLP Start Time 0900    SLP Stop Time  0950    SLP Time Calculation (min) 50 min    Activity Tolerance Patient tolerated treatment well              Past Medical History:  Diagnosis Date   Anxiety    Arthritis    legs, hands   Cellulitis of right knee    started on antibiotics 06/21/15.     Depression    Family history of adverse reaction to anesthesia    sister and mom -PONV   Hypercholesteremia    Hypertension    Past Surgical History:  Procedure Laterality Date   ABDOMINAL HYSTERECTOMY     COLONOSCOPY WITH PROPOFOL N/A 06/27/2015   Procedure: COLONOSCOPY WITH PROPOFOL;  Surgeon: Lucilla Lame, MD;  Location: [facility];  Service: Endoscopy;  Laterality: N/A;   POLYPECTOMY  06/27/2015   Procedure: POLYPECTOMY;  Surgeon: Lucilla Lame, MD;  Location: [facility];  Service: Endoscopy;;   TOTAL HIP ARTHROPLASTY Right 09/10/2019   Procedure: TOTAL HIP ARTHROPLASTY ANTERIOR APPROACH;  Surgeon: Lovell Sheehan, MD;  Location: [facility];  Service: Orthopedics;  Laterality: Right;   Patient Active Problem List   Diagnosis Date Noted   Acute stroke due to ischemia ([place]) 08/10/2020   History of total hip replacement, right 09/10/2019   Special screening for malignant neoplasms, colon    Benign neoplasm of ascending colon    Benign neoplasm of descending colon     ONSET DATE: 08/10/20  REFERRING DIAG: CVA, long covid   THERAPY DIAG:  Cognitive communication deficit  Rationale for Evaluation and Treatment Rehabilitation  SUBJECTIVE: Pt reports she was  sick at the end of last week but resumed her schedule/planning after the weekend.  PAIN:  Are you having pain? No     OBJECTIVE:   TODAY'S TREATMENT: Patient continues to report pre-planning for Bible study and discussions with spouse with positive results, even when not feeling well last week. She also made requests of her husband to assist her with tasks she had planned to do herself (making pasta salad). Forgot to add her plans for next month to calendar but wrote a reminder to do so, after SLP drew this to her attention. Pt had completed plans for cooking and gym days on this week's schedule, and included a social event for the weekend. Targeted alternating attention with tablet task (symbols), with initial mod cues to use strategy of association/verbalizing sequence to improve accuracy (90%, improved to 100% with mod I by end of task). Discussed discharge plans as pt is anticipated to meet goals by 09/16/21.  PATIENT EDUCATION: Education details: goal for consistency/independence with planner Person educated: Patient Education method: Explanation, Demonstration, and Verbal cues Education comprehension: verbalized understanding, returned demonstration, and needs further education   SLP Short Term Goals - 07/30/21 0843       SLP SHORT TERM GOAL #1   Title Pt will verbalize and demonstrate how to implement 2 memory and attention  strategies to aid daily functioning given occasional min A over 2 sessions    Status Achieved      SLP SHORT TERM GOAL #2   Title Pt will manage finances with use of compensations with no reported episodes of missed bills given rare min A    Status Achieved      SLP SHORT TERM GOAL #3   Title Pt will identify errors in writing and self-correct with 80% accuracy given rare min A over 2 sessions    Status Achieved      SLP SHORT TERM GOAL #4   Title Pt will participate in further language assessment as needed (goals to be added)    Status Achieved    LTGs  = STGs          SLP Long Term Goals - 07/30/21 0843       SLP LONG TERM GOAL #1   Title Pt will verbalize and implement 4 attention and memory strategies to aid daily functioning for ADLs and IADLs with rare min A over 2 sessions    Baseline 07/29/21: occasional min-mod cues    Time 12    Period Weeks    Status On-going    Target Date 08/18/21      SLP LONG TERM GOAL #2   Title Pt will identify and correct paraphasias in conversation and writing with 90% accuracy given rare min A over 2 sessions    Time 12    Period Weeks    Status Achieved      SLP LONG TERM GOAL #3   Title Pt will use compensations for thought organization effectively in 20 minutes complex conversation.    Baseline mod complex conv achieved 5/3, goal revised 07/29/21: Having weekly planned conversations about feelings with spouse, cues for consistency necessary for Bible study planning    Time 12    Period Weeks    Status On-going    Target Date 08/18/21      SLP LONG TERM GOAL #4   Title Patient will maintain alternating attention between 2 mod complex tasks for 15 minutes with modified independence (strategies allowed).    Baseline 07/29/21 min-mod cues necessary to use strategies    Time 12    Period Weeks    Status On-going    Target Date 08/18/21      SLP LONG TERM GOAL #5   Title Patient will use strategies and executive function aid to preplan and manage IADLs independently.    Baseline 07/29/21: using planner to manage appointments; requires min-mod cues to preplan for meals    Time 12    Period Weeks    Status On-going    Target Date 08/18/21                Plan - 07/15/21 1344     Clinical Impression Statement Patient continues with mild cognitive communication impairment. Pt continues to carryover of strategy to preplan for weekly conversations with spouse, and is now consistently preplanning to increase her participation in [place] study. Pt reports functional improvements and that she  is pleased with her progress this week. She is progressing toward LTGs and is expected to meet these with in next 3-5 sessions. Skilled ST remains necessary for carryover of compensatory strategies and to maximize pt's cognitive communication and language skills to improve accuracy/independence with ADLs and IADLs, reduce communication breakdowns, and improve quality of life.     Speech Therapy Frequency 1x /week    Duration 12 weeks  Treatment/Interventions Language facilitation;Environmental controls;SLP instruction and feedback;Cognitive reorganization;Compensatory strategies;Patient/family education;Internal/external aids    Potential to Achieve Goals Good    SLP Home Exercise Plan see pt instructions    Consulted and Agree with Plan of Care Patient           Deneise Lever, MS, CCC-SLP Speech-Language Pathologist ([place], [place] 08/12/2021, 1:08 PM

## 2021-08-12 NOTE — Therapy (Signed)
OUTPATIENT PHYSICAL THERAPY TREATMENT NOTE/RECERT   Patient Name: Kim Gomez MRN: [id number] DOB:[birth year], 66 y.o., female Today's Date: 08/12/2021  PCP: Gladstone Lighter, MD REFERRING PROVIDER: Vladimir Crofts, MD   PT End of Session - 08/12/21 0849     Visit Number 28    Number of Visits 33    Date for PT Re-Evaluation 09/16/21    Authorization Time Period Recert 03/21/8327-1/91/6606    Progress Note Due on Visit 30    PT Start Time 0803    PT Stop Time 0844    PT Time Calculation (min) 41 min    Equipment Utilized During Treatment Gait belt    Activity Tolerance Patient tolerated treatment well;No increased pain    Behavior During Therapy WFL for tasks assessed/performed                Past Medical History:  Diagnosis Date   Anxiety    Arthritis    legs, hands   Cellulitis of right knee    started on antibiotics 06/21/15.     Depression    Family history of adverse reaction to anesthesia    sister and mom -PONV   Hypercholesteremia    Hypertension    Past Surgical History:  Procedure Laterality Date   ABDOMINAL HYSTERECTOMY     COLONOSCOPY WITH PROPOFOL N/A 06/27/2015   Procedure: COLONOSCOPY WITH PROPOFOL;  Surgeon: Lucilla Lame, MD;  Location: [place];  Service: Endoscopy;  Laterality: N/A;   POLYPECTOMY  06/27/2015   Procedure: POLYPECTOMY;  Surgeon: Lucilla Lame, MD;  Location: [place];  Service: Endoscopy;;   TOTAL HIP ARTHROPLASTY Right 09/10/2019   Procedure: TOTAL HIP ARTHROPLASTY ANTERIOR APPROACH;  Surgeon: Lovell Sheehan, MD;  Location: [facility];  Service: Orthopedics;  Laterality: Right;   Patient Active Problem List   Diagnosis Date Noted   Acute stroke due to ischemia ([place]) 08/10/2020   History of total hip replacement, right 09/10/2019   Special screening for malignant neoplasms, colon    Benign neoplasm of ascending colon    Benign neoplasm of descending colon     REFERRING DIAG: I63.9 (ICD-10-CM) - Stroke  ([place])  THERAPY DIAG:  Muscle weakness (generalized)  Stiffness of right shoulder, not elsewhere classified  Chronic right shoulder pain  Rationale for Evaluation and Treatment Rehabilitation  PERTINENT HISTORY: 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 Pt reports a fall on October 29 th. Pt reports she hurt the R UE with the fall and she reports some stiffness in the knee as well as general weakness on the right side. Pt reports she has had 2 strokes but the first went under the radar and was only evidenced by imaging presented to her by her MD. Pt reports she has been modifying her activities in order to be careful not to fall since the stroke. Reports she has been taking her time with a lot of things. Pt reports she has a membership at [employer] and she has been walking on the treadmill 2-3 times per week, she reports she walks on the treadmill for an hour at a time following working her way up. Pt reports ocassional numbness and tingling in her hands.   PRECAUTIONS: none  SUBJECTIVE: Pt was sick over the weekend. She reports she is feeling better. She has no current shoulder pain at the moment. Last Thursday she had some shoulder pain following PT session but thinks this  was due to coming down with something. Went to physician last week found to have acute bacterial sinusitis with neck and head pain at the time.   PAIN:  Are you having pain? Yes: NPRS scale: 0/10 Pain location: Right anterior shoulder  Pain description: "stiff" Aggravating factors: prolonged positioning  Relieving factors: PT exercises      TODAY'S TREATMENT:  08/12/21   INTERVENTIONS  Goals reassessed for recert. See goal section below for details  Manual: Pt supine on plinth  GH distraction 4 x 20 sec hold  Shoulder inferior mob 5x20 sec. Reports "feels good" AP and PA GH grade 2-3 joint mobilizations x 3 min at  various degrees of motion   Therex:  Supine AROM: shoulder flexion, abd, ER, 10x each  Serratus punch with perturbations: 2x60 sec. Rates medium  AROM assessment (see goal section below)  Reports improvement with overhead activities with RUE but must perform slowly  Supine bicep curl 2# 10x.   Supine serratus punch 2# 10x  Cone stacking exercise 4 cones x 2 sets stack/unstack. Performs pain free  Seated GTB B shoulder ER 15x. Limited motion of RUE compared to LUE     PATIENT EDUCATION Education details: Exercise technique, body mechanics, goal reassessment, plan Person educated: Patient Education method: Explanation, Demonstration, Tactile cues, and Verbal cues Education comprehension: verbalized understanding, returned demonstration, and verbal cues required   HOME EXERCISE PROGRAM: No changes as of 08/05/21   PT Short Term Goals        PT SHORT TERM GOAL #1   Title Patient will be independent in home exercise program to improve strength/mobility for better functional independence with ADLs.    Baseline no HEP at this time    Time 4    Period Weeks    Status Achieved    Target Date 02/17/21      PT SHORT TERM GOAL #2   Title Patient will be independent in progressive shoulder home exercise program in order to improve her shoulder strength and function    Baseline Patient has initial home exercise program and is comfortable with that but it is yet to be progressed for higher level activities ; 7/26: working toward independence   Time 2   Period Weeks    Status New    Target Date 08/26/2021              PT Long Term Goals        PT LONG TERM GOAL #1   Title Patient will increase FOTO score to equal to or greater than  50   to demonstrate statistically significant improvement in mobility and quality of life.    Baseline 41 on 01/20/21    Time 8    Period Weeks    Status Achieved    Target Date 03/17/21      PT LONG TERM GOAL #2   Title Pt will improve  mini BEST test by 4 points or more in order to indicate clinically significant improvent in balance.    Baseline see flowsheets 27 on 2/27    Time 12    Period Weeks    Status Achieved    Target Date 04/14/21      PT LONG TERM GOAL #3   Title Patient  will complete five times sit to stand test in < 15 seconds indicating an increased LE strength and improved balance.    Baseline 13.9 sec on 2/27    Time 12    Period Weeks  Status Achieved    Target Date 04/14/21      PT LONG TERM GOAL #4   Title Patient will increase 10 meter walk test to >1.40ms as to improve gait speed for better community ambulation and to reduce fall risk.    Baseline .86 m/s on 01/20/21 2/27: 1.27 m/s    Time 8    Period Weeks    Status Achieved    Target Date 03/17/21      PT LONG TERM GOAL #5   Title Pt will improve dual task TUG to less than 20 seconds in order to indicate improved cognitive and motor tasks.    Baseline 28.82 on 01/20/21 14.98 sec on 2/27    Time 12    Period Weeks    Status Achieved    Target Date 04/14/21      Additional Long Term Goals   Additional Long Term Goals Yes      PT LONG TERM GOAL #6   Title Patient will improve right shoulder by 1 increment on manual muscle test grading scale in order to indicate improved right upper extremity strength.    Baseline 3-/5 her right shoulder flexion and abduction(due to range of motion restriction) 3+ with external rotation at 0 degrees abduction; 05/18/2021=Access Code: [email or web address] URL: [email or web address] ; 7/26: grossly 4+/5 with exception of flexion and IR which are both 4-/5   Time 12    Period Weeks    Status Achieved   Target Date 08/10/21      PT LONG TERM GOAL #7   Title Patient will improve QuickDASH score by 12.5 points in order to indicate significantly improved subjective rating of right shoulder function    Baseline 50% on 03/23/2021; 05/18/2021= 23%    Time 8    Period Weeks    Status Achieved    Target Date  05/18/21      PT LONG TERM GOAL #8   Title Patient will improve right shoulder active range of motion by 30 degrees with elevation and by 15 degrees with external rotation in order to indicate improved shoulder function for overhead activities and for self-care activities    Baseline 90 degrees flexion range of motion , 85 degrees abduction range of motion, 25 degrees external rotation range of motion ( all AROM); 05/18/2021=R Shoulder AROM: Flex= 158 deg; ABD= 152; ER= 44    Time 8    Period Weeks    Status Achieved    Target Date 05/18/21      PT LONG TERM GOAL  #9   TITLE Patient will improve right shoulder passive range of motion by 30 degrees of elevation by 10 degrees with external rotation in order to indicate improved right shoulder range of motion for progressive functional activities    Baseline 120 degrees flexion, 123 degrees abduction, 50 degrees external rotation at 45 degrees abduction on 03/23/2021; 05/18/2021=R Shoulder PROM: Flex= 164 deg; ABD= 160; ER= 52; 7/26: Flex 150 deg, ABD 110 deg *pain limited when tested, ER 49 deg.   Time 5   Period Weeks    Status Partially Met    Target Date 09/16/2021      PT LONG TERM GOAL  #10   TITLE Patient will improve right shoulder active range of motion by 15 degrees with elevation and by 10 degrees with external rotation in order to indicate improved shoulder function for overhead activities including reaching for 2nd and 3rd shelf in kitchen cabinet and for self-care activities  Baseline 05/18/2021=R Shoulder AROM: Flex= 158 deg; ABD= 152; ER= 44 and Patient reports difficulty reaching overhead onto 2nd and 3rd shelf at home; 7/26: 145 deg flex, 143 deg abduction, 40 deg    Time 5    Period Weeks    Status New    Target Date 09/16/2021              Plan -     Clinical Impression Statement Goals reassessed for recert. Pt has met her MMT goal, indicating improved pain-free RUE strength. However, still has some deficits in flexion and  IR strength. Pt did see decrease in ROM of RUE. This could in part be due to recent illness where pt experienced increased R side pain and missed sessions. Overall, pt now able to perform overhead activities using RUE pain-free, but slowly. Discussed with pt completing a few more visits of PT to target remaining deficits and prepare for dc. Pt agreeable to plan. Pt will continue to benefit from skilled PT in order to improve shoulder function and QOL.    Personal Factors and Comorbidities Age;Comorbidity 1;Comorbidity 2    Comorbidities arthritis, anxiety, HTN, hyperlipedemia.    Examination-Activity Limitations Carry;Lift;Locomotion Level    Examination-Participation Restrictions Cleaning;Yard Work;Meal Prep    Stability/Clinical Decision Making Stable/Uncomplicated    Rehab Potential Good    PT Frequency 1x / week    PT Duration 5 weeks    PT Treatment/Interventions ADLs/Self Care Home Management;Gait training;Therapeutic activities;Functional mobility training;Therapeutic exercise;Balance training;Neuromuscular re-education;Manual techniques;Energy conservation;Passive range of motion;Cryotherapy;Electrical Stimulation;Moist Heat;Patient/family education;Dry needling;Joint Manipulations    PT Next Visit Plan shoulder strenghtening & ROM exercises, continue POC   PT Home Exercise Plan 05/18/2021: Access Code: [email or web address]  URL: [email or web address]  Access Code: [email or web address]  URL: [email or web address]; no updates    Consulted and Agree with Plan of Care Patient              Zollie Pee, PT 08/12/2021, 9:00 AM

## 2021-08-12 NOTE — Therapy (Addendum)
OUTPATIENT OCCUPATIONAL THERAPY TREATMENT NOTE   Patient Name: Kim Gomez MRN: [id number] DOB:[birth year], 66 y.o., female Today's Date: 08/12/2021  PCP: Gladstone Lighter, MD REFERRING PROVIDER: Jennings Books, MD   OT End of Session - 08/12/21 1035     Visit Number 39    Number of Visits 30    Date for OT Re-Evaluation 09/30/21    Authorization Time Period Progress report period starting 07/08/2021    OT Start Time 0950    OT Stop Time 1032    OT Time Calculation (min) 42 min    Activity Tolerance Patient tolerated treatment well    Behavior During Therapy [facility] for tasks assessed/performed             Past Medical History:  Diagnosis Date   Anxiety    Arthritis    legs, hands   Cellulitis of right knee    started on antibiotics 06/21/15.     Depression    Family history of adverse reaction to anesthesia    sister and mom -PONV   Hypercholesteremia    Hypertension    Past Surgical History:  Procedure Laterality Date   ABDOMINAL HYSTERECTOMY     COLONOSCOPY WITH PROPOFOL N/A 06/27/2015   Procedure: COLONOSCOPY WITH PROPOFOL;  Surgeon: Lucilla Lame, MD;  Location: [place];  Service: Endoscopy;  Laterality: N/A;   POLYPECTOMY  06/27/2015   Procedure: POLYPECTOMY;  Surgeon: Lucilla Lame, MD;  Location: [place];  Service: Endoscopy;;   TOTAL HIP ARTHROPLASTY Right 09/10/2019   Procedure: TOTAL HIP ARTHROPLASTY ANTERIOR APPROACH;  Surgeon: Lovell Sheehan, MD;  Location: [facility];  Service: Orthopedics;  Laterality: Right;   Patient Active Problem List   Diagnosis Date Noted   Acute stroke due to ischemia ([place]) 08/10/2020   History of total hip replacement, right 09/10/2019   Special screening for malignant neoplasms, colon    Benign neoplasm of ascending colon    Benign neoplasm of descending colon      REFERRING DIAG: CVA  THERAPY DIAG:  Muscle weakness (generalized)  Other lack of coordination  Rationale for Evaluation and Treatment  Rehabilitation  PERTINENT HISTORY: Pt. is a 66 y.o. female with PMH significant for HTN, HLD, anxiety disorder, recently hospitalized for COVID-19 infection (08/06/2020) at an outside facility. Patient was brought to the ED on 7/24 after she had a sudden onset of numbness involving right side of her body without any motor deficits, dysarthria, dysphagia. 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 Per chart for her f/u neuro visit:Left ischemic thalamic/internal capsule infarct presenting with right-sided numbness, weakness - now with residual cognitive fog (improving), imbalance (improving), fatigue, right first + second digit numbness, in patient with known vascular risk factors of hypertension, dyslipidemia, lack of physical activity, snoring  PRECAUTIONS: None  SUBJECTIVE:  Pt. reports having had a very busy weekend full of birthday, and graduation parties.  PAIN:  Are you having pain? Yes: {yes pain: NPRS scale: 5/10, Pain location: right shoulder, Pain description: [place], Aggravating factors: Movement initially. Relieving factors: stretching.      OBJECTIVE:   OT TREATMENT     Neuro muscular re-education:   Pt. worked on functional RUE reaching with [facility] skills reaching up to place 1&1/2" pegs onto an elevated vertical [employer].  Pt. worked on translatory movements, moving the pegs from the tip of her fingers, to her palm, through her hand, and back out to the tip of her 2nd digit,  3rd digit, and thumb. Pt. worked on sustained RUE reach while flipping the pegs. Pt. worked on reaching up into right shoulder flexion, and abduction to place to pegs in the container after performing translatory movements.      Pt. reports that her arm feels better today, however required multiple rest breaks during the session after multiple reps of reaching. Pt. presented with improved isolated right shoulder movements today. Pt. Was able  to grasp, and store, and perform translatory movements with 3 pegs, however had difficulty with progressing to attempt the movements with 4. Pt. continues to work on improving RUE strength, and right functional hand use to improve reaching during ADLs, and IADL tasks.     PATIENT EDUCATION: Education details: RUE functioning Person educated: Patient Education method: Explanation, Demonstration, and Verbal cues Education comprehension: verbalized understanding, returned demonstration, tactile cues required, and needs further education   HOME EXERCISE PROGRAM Continue ongoing HEPs for the RUE     OT Long Term Goals - 07/08/21 1024       OT LONG TERM GOAL #1   Title Patient will be independent with home exercise program    Baseline 03/23/2021: Pt. is independent, 4/5: will continue to upgrade exercises5/03/2021: Independent 07/08/2021: Independent with HEPs    Time 12    Period Weeks    Status On-going    Target Date 09/30/21      OT LONG TERM GOAL #4   Title Patient will increase right grip strength by 10 pounds to be able to open jars and containers with modified independence    Baseline 28 pounds at eval and difficulty with managing jars and containers. 10th visit: R: 34# Jars, and containers continue to be difficulty to open.  4/5: right grip 50# 05/20/2021: right grip strength 40# 07/08/2021: R: 49#    Time 12    Period Weeks    Status Partially Met    Target Date 09/30/21      OT LONG TERM GOAL #6   Title Patient will decrease right shoulder pain to 2 out of 10 or less with movement patterns for daily activities.    Baseline Increased pain in right shoulder, 5 out of 10 with movement. 10th visit: 8/10 right shoulder pain, 4/5:  2/10 with movement today but has increased pain other days and not yet consistent.5/03: 0/10 during movement today. 07/08/2021: 0/10 pain in the right shoulder    Time 12    Period Weeks    Status Partially Met    Target Date 09/30/21      OT LONG TERM  GOAL #8   Title Will assess for potential return to work tasks as patient progresses with therapy    Baseline Patient currently unable to perform her job as outlined in her job description    Time 12    Period Weeks    Status Deferred      OT [place]  #9   TITLE Pt will improve strength to be able to reach up to place items into closets, and cabinets independently    Baseline 07/08/2021:pt. has difficulty reaching up to place items in closets, and cabinets. 05/20/2021: Pt. continues to have difficulty 4/5: unable    Time 12    Period Weeks    Status Revised    Target Date 09/30/21      OT LONG TERM GOAL  #10   TITLE When on the passenger side, pt will be able to close door with modified independence  Baseline 07/08/2021: Met 4/5:  difficulty at times    Time 6    Period Weeks    Status Achieved      OT LONG TERM GOAL  #11   TITLE Pt will demonstrate ability to vacuum carpet of 2 rooms with use of right UE and no rest breaks    Baseline 07/08/2021: Independent without rest breaks. 4/5:  difficulty with using vacuum. 5/03: Pt. has progressed and is now able to vacuum multiple rooms in her home    Time 6    Period Weeks    Status On-going    Target Date 09/30/21      OT LONG TERM GOAL  #13   TITLE Pt will improve strength to pick up 15# bag of fertilizer for yard work 5/03: Pt. continues to have difficulty    Baseline 07/08/2021: Pt. continues to have difficulty picking up a bag of fertilizer. 4/5: unable    Time 12    Period Weeks    Status On-going    Target Date 07/15/21                Plan - 07/27/21 1258     Clinical Impression Statement Pt. reports that her arm feels better today, however required multiple rest breaks during the session after multiple reps of reaching. Pt. presented with improved isolated right shoulder movements today. Pt. Was able to grasp, and store, and perform translatory movements with 3 pegs, however had difficulty with progressing to  attempt the movements with 4. Pt. continues to work on improving RUE strength, and right functional hand use to improve reaching during ADLs, and IADL tasks.               OT Occupational Profile and History Problem Focused Assessment - Including review of records relating to presenting problem    Body Structure / Function / Physical Skills ADL;Dexterity;ROM;Strength;Coordination;FMC;IADL;Pain;Sensation;UE functional use;Decreased knowledge of use of DME    Cognitive Skills Memory;Safety Awareness    Psychosocial Skills Environmental  Adaptations;Habits;Routines and Behaviors    Rehab Potential Good    Clinical Decision Making Several treatment options, min-mod task modification necessary    Comorbidities Affecting Occupational Performance: May have comorbidities impacting occupational performance    Modification or Assistance to Complete Evaluation  No modification of tasks or assist necessary to complete eval    OT Frequency 2x / week    OT Duration 12 weeks    OT Treatment/Interventions Self-care/ADL training;Cryotherapy;Paraffin;Therapeutic exercise;DME and/or AE instruction;Cognitive remediation/compensation;Neuromuscular education;Manual Therapy;Moist Heat;Contrast Bath;Therapeutic activities;Patient/family education    Consulted and Agree with Plan of Care Patient              Harrel Carina, MS, OTR/L   Harrel Carina, OT 08/12/2021, 10:40 AM

## 2021-08-17 ENCOUNTER — Ambulatory Visit: Payer: No Typology Code available for payment source | Admitting: Speech Pathology

## 2021-08-17 ENCOUNTER — Ambulatory Visit: Payer: No Typology Code available for payment source | Admitting: Physical Therapy

## 2021-08-17 ENCOUNTER — Ambulatory Visit: Payer: No Typology Code available for payment source | Admitting: Occupational Therapy

## 2021-08-17 DIAGNOSIS — M6281 Muscle weakness (generalized): Secondary | ICD-10-CM

## 2021-08-17 DIAGNOSIS — R278 Other lack of coordination: Secondary | ICD-10-CM

## 2021-08-17 NOTE — Therapy (Addendum)
Occupational Therapy Progress Note  Dates of reporting period  07/08/2021   to   08/17/2021    Patient Name: Kim Gomez MRN: [id number] DOB:09-Oct-1955, 66 y.o., female Today's Date: 08/17/2021  PCP: Gladstone Lighter, MD REFERRING PROVIDER: Jennings Books, MD   OT End of Session - 08/17/21 [phone]     Visit Number 40    Number of Visits 45    Date for OT Re-Evaluation 09/30/21    Authorization Time Period Progress report period starting 07/08/2021    OT Start Time 0915    OT Stop Time 1000    OT Time Calculation (min) 45 min    Activity Tolerance Patient tolerated treatment well    Behavior During Therapy [facility] for tasks assessed/performed             Past Medical History:  Diagnosis Date   Anxiety    Arthritis    legs, hands   Cellulitis of right knee    started on antibiotics 06/21/15.     Depression    Family history of adverse reaction to anesthesia    sister and mom -PONV   Hypercholesteremia    Hypertension    Past Surgical History:  Procedure Laterality Date   ABDOMINAL HYSTERECTOMY     COLONOSCOPY WITH PROPOFOL N/A 06/27/2015   Procedure: COLONOSCOPY WITH PROPOFOL;  Surgeon: Lucilla Lame, MD;  Location: [place];  Service: Endoscopy;  Laterality: N/A;   POLYPECTOMY  06/27/2015   Procedure: POLYPECTOMY;  Surgeon: Lucilla Lame, MD;  Location: [place];  Service: Endoscopy;;   TOTAL HIP ARTHROPLASTY Right 09/10/2019   Procedure: TOTAL HIP ARTHROPLASTY ANTERIOR APPROACH;  Surgeon: Lovell Sheehan, MD;  Location: [facility];  Service: Orthopedics;  Laterality: Right;   Patient Active Problem List   Diagnosis Date Noted   Acute stroke due to ischemia ([place]) 08/10/2020   History of total hip replacement, right 09/10/2019   Special screening for malignant neoplasms, colon    Benign neoplasm of ascending colon    Benign neoplasm of descending colon      REFERRING DIAG: CVA  THERAPY DIAG:  Muscle weakness (generalized)  Other lack of  coordination  Rationale for Evaluation and Treatment Rehabilitation  PERTINENT HISTORY: Pt. is a 66 y.o. female with PMH significant for HTN, HLD, anxiety disorder, recently hospitalized for COVID-19 infection (08/06/2020) at an outside facility. Patient was brought to the ED on 7/24 after she had a sudden onset of numbness involving right side of her body without any motor deficits, dysarthria, dysphagia. 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 Per chart for her f/u neuro visit:Left ischemic thalamic/internal capsule infarct presenting with right-sided numbness, weakness - now with residual cognitive fog (improving), imbalance (improving), fatigue, right first + second digit numbness, in patient with known vascular risk factors of hypertension, dyslipidemia, lack of physical activity, snoring  PRECAUTIONS: None  SUBJECTIVE:  Pt. reports having had a very busy weekend full of birthday, and graduation parties.  PAIN:  Are you having pain? Yes: {yes pain: NPRS scale: 1/10, Pain location: right shoulder. Aggravating factors: Movement initially. Relieving factors: stretching.      OBJECTIVE:   OT TREATMENT     There. Ex.:  Pt. worked on functional reaching with the [place]. Pt. worked on reaching using the [employer]. Pt. grasped and moved the shapes through 4 vertical dowels of progressively increasing heights.    Measurements were obtained, and goals were reviewed with the pt. Pt.  has made progress overall from the initial eval with grip strength, however continues to work towards being able to open jars, and containers efficiently. Pt. Pain has improved to 1/10 with the pt. requiring fewer rest breaks during the session after multiple reps of reaching. Pt. has improved with isolated shoulder range ROM, and is now able to reach up to shelves. Pt. has difficulty retrieving items with weight from higher shelves, and pushing full  standard sized shopping carts secondary to weakness. Pt. continues to work on improving RUE strength to improve ADL, and IADL functioning including: using RUE for functional reaching while retrieving weighted items from higher higher shelves, and pushing shopping carts.  Measurements   Right   Shoulder flexion: 145 Shoulder Abduction: 125 Wrist extension 45  Strength:  Shoulder flexion: 4/5 Abduction 4/5 Elbow flexion/extension 5/5  Grip strength: 42# Pinch strength: 16# 3Pt.: 12#  Gate, 9-hole peg test: 25 sec.   PATIENT EDUCATION: Education details: RUE functioning Person educated: Patient Education method: Explanation, Demonstration, and Verbal cues Education comprehension: verbalized understanding, returned demonstration, tactile cues required, and needs further education   HOME EXERCISE PROGRAM Continue ongoing HEPs for the RUE     OT Long Term Goals - 07/08/21 1024       OT LONG TERM GOAL #1   Title Patient will be independent with home exercise program    Baseline 08/17/2021: Independent 03/23/2021: Pt. is independent, 4/5: will continue to upgrade exercises5/03/2021: Independent 07/08/2021: Independent with HEPs    Time 12    Period Weeks    Status On-going    Target Date 09/30/21      OT LONG TERM GOAL #4   Title Patient will increase right grip strength by 10 pounds to be able to open jars and containers with modified independence    Baseline 28 pounds at eval and difficulty with managing jars and containers. 10th visit: R: 34# Jars, and containers continue to be difficulty to open.  4/5: right grip 50# 05/20/2021: right grip strength 40# 07/08/2021: R: 49# 08/17/2021: 42#   Time 12    Period Weeks    Status Partially Met    Target Date 09/30/21      OT LONG TERM GOAL #6   Title Patient will decrease right shoulder pain to 2 out of 10 or less with movement patterns for daily activities.    Baseline Increased pain in right shoulder, 5 out of 10 with movement.  10th visit: 8/10 right shoulder pain, 4/5:  2/10 with movement today but has increased pain other days and not yet consistent.5/03: 0/10 during movement today. 07/08/2021: 0/10 pain in the right shoulder 08/17/2021: 1/10   Time 12    Period Weeks    Status Partially Met    Target Date 09/30/21      OT LONG TERM GOAL #8   Title Will assess for potential return to work tasks as patient progresses with therapy    Baseline Patient currently unable to perform her job as outlined in her job description    Time 12    Period Weeks    Status Deferred      OT [place]  #9   TITLE Pt will improve strength to be able to reach up to place items into closets, and cabinets independently    Baseline 08/17/2021: Pt. Is a able to reach up, however is not efficient, takes increased time, and is unable to grasp weighted objects from shelves. 07/08/2021:pt. has difficulty reaching up to  place items in closets, and cabinets. 05/20/2021: Pt. continues to have difficulty 4/5: unable    Time 12    Period Weeks    Status Revised    Target Date 09/30/21      OT LONG TERM GOAL  #10   TITLE When on the passenger side, pt will be able to close door with modified independence    Baseline 07/08/2021: Met 4/5:  difficulty at times    Time 6    Period Weeks    Status Achieved      OT LONG TERM GOAL  #11   TITLE Pt will demonstrate ability to vacuum carpet of 2 rooms with use of right UE and no rest breaks    Baseline 08/17/2021: Independent, and efficient. 07/08/2021: Independent without rest breaks. 4/5:  difficulty with using vacuum. 5/03: Pt. has progressed and is now able to vacuum multiple rooms in her home    Time 6    Period Weeks    Status Achieved   Target Date 09/30/21      OT LONG TERM GOAL  #13   TITLE Pt will improve strength to be able to push a full grocery cart   Baseline 08/17/2021: Pt. Has difficulty pushing a full size grocery cart. 07/08/2021: Pt. continues to have difficulty picking up a bag of  fertilizer. 4/5: unable    Time 12    Period Weeks    Status revised   Target Date 07/15/21                Plan - 07/27/21 1258     Clinical Impression Statement Measurements were obtained, and goals were reviewed with the pt. Pt. has made progress overall from the initial eval with grip strength, however continues to work towards being able to open jars, and containers efficiently. Pt. Pain has improved to 1/10 with the pt. requiring fewer rest breaks during the session after multiple reps of reaching. Pt. has improved with isolated shoulder range ROM, and is now able to reach up to shelves. Pt. has difficulty retrieving items with weight from higher shelves, and pushing full standard sized shopping carts secondary to weakness. Pt. continues to work on improving RUE strength to improve ADL, and IADL functioning including: using RUE for functional reaching while retrieving weighted items from higher higher shelves, and pushing shopping carts.                 OT Occupational Profile and History Problem Focused Assessment - Including review of records relating to presenting problem    Body Structure / Function / Physical Skills ADL;Dexterity;ROM;Strength;Coordination;FMC;IADL;Pain;Sensation;UE functional use;Decreased knowledge of use of DME    Cognitive Skills Memory;Safety Awareness    Psychosocial Skills Environmental  Adaptations;Habits;Routines and Behaviors    Rehab Potential Good    Clinical Decision Making Several treatment options, min-mod task modification necessary    Comorbidities Affecting Occupational Performance: May have comorbidities impacting occupational performance    Modification or Assistance to Complete Evaluation  No modification of tasks or assist necessary to complete eval    OT Frequency 2x / week    OT Duration 12 weeks    OT Treatment/Interventions Self-care/ADL training;Cryotherapy;Paraffin;Therapeutic exercise;DME and/or AE instruction;Cognitive  remediation/compensation;Neuromuscular education;Manual Therapy;Moist Heat;Contrast Bath;Therapeutic activities;Patient/family education    Consulted and Agree with Plan of Care Patient              Harrel Carina, MS, OTR/L   Harrel Carina, OT 08/17/2021, 9:18 AM

## 2021-08-19 ENCOUNTER — Ambulatory Visit: Payer: No Typology Code available for payment source | Admitting: Speech Pathology

## 2021-08-19 ENCOUNTER — Encounter: Payer: Self-pay | Admitting: Occupational Therapy

## 2021-08-19 ENCOUNTER — Ambulatory Visit: Payer: No Typology Code available for payment source | Admitting: Occupational Therapy

## 2021-08-19 ENCOUNTER — Ambulatory Visit: Payer: No Typology Code available for payment source | Attending: Neurology

## 2021-08-19 ENCOUNTER — Encounter: Payer: No Typology Code available for payment source | Admitting: Speech Pathology

## 2021-08-19 DIAGNOSIS — R278 Other lack of coordination: Secondary | ICD-10-CM | POA: Diagnosis present

## 2021-08-19 DIAGNOSIS — R4701 Aphasia: Secondary | ICD-10-CM | POA: Insufficient documentation

## 2021-08-19 DIAGNOSIS — M25611 Stiffness of right shoulder, not elsewhere classified: Secondary | ICD-10-CM | POA: Insufficient documentation

## 2021-08-19 DIAGNOSIS — M6281 Muscle weakness (generalized): Secondary | ICD-10-CM | POA: Diagnosis present

## 2021-08-19 DIAGNOSIS — R41841 Cognitive communication deficit: Secondary | ICD-10-CM | POA: Diagnosis present

## 2021-08-19 DIAGNOSIS — G8929 Other chronic pain: Secondary | ICD-10-CM | POA: Diagnosis present

## 2021-08-19 DIAGNOSIS — M25511 Pain in right shoulder: Secondary | ICD-10-CM | POA: Insufficient documentation

## 2021-08-19 NOTE — Therapy (Addendum)
Occupational Therapy Treatment Note   Patient Name: Kim Gomez MRN: [id number] DOB:1955-04-20, 66 y.o., female Today's Date: 08/19/2021  PCP: Gladstone Lighter, MD REFERRING PROVIDER: Jennings Books, MD   OT End of Session - 08/19/21 0935     Visit Number 41    Number of Visits 62    Date for OT Re-Evaluation 09/30/21    Authorization Time Period Progress report period starting 08/17/2021    OT Start Time 0932    OT Stop Time 1015    OT Time Calculation (min) 43 min    Activity Tolerance Patient tolerated treatment well    Behavior During Therapy [facility] for tasks assessed/performed             Past Medical History:  Diagnosis Date   Anxiety    Arthritis    legs, hands   Cellulitis of right knee    started on antibiotics 06/21/15.     Depression    Family history of adverse reaction to anesthesia    sister and mom -PONV   Hypercholesteremia    Hypertension    Past Surgical History:  Procedure Laterality Date   ABDOMINAL HYSTERECTOMY     COLONOSCOPY WITH PROPOFOL N/A 06/27/2015   Procedure: COLONOSCOPY WITH PROPOFOL;  Surgeon: Lucilla Lame, MD;  Location: [place];  Service: Endoscopy;  Laterality: N/A;   POLYPECTOMY  06/27/2015   Procedure: POLYPECTOMY;  Surgeon: Lucilla Lame, MD;  Location: [place];  Service: Endoscopy;;   TOTAL HIP ARTHROPLASTY Right 09/10/2019   Procedure: TOTAL HIP ARTHROPLASTY ANTERIOR APPROACH;  Surgeon: Lovell Sheehan, MD;  Location: [facility];  Service: Orthopedics;  Laterality: Right;   Patient Active Problem List   Diagnosis Date Noted   Acute stroke due to ischemia ([place]) 08/10/2020   History of total hip replacement, right 09/10/2019   Special screening for malignant neoplasms, colon    Benign neoplasm of ascending colon    Benign neoplasm of descending colon      REFERRING DIAG: CVA  THERAPY DIAG:  Muscle weakness (generalized)  Rationale for Evaluation and Treatment Rehabilitation  PERTINENT HISTORY: Pt. is  a 66 y.o. female with PMH significant for HTN, HLD, anxiety disorder, recently hospitalized for COVID-19 infection (08/06/2020) at an outside facility. Patient was brought to the ED on 7/24 after she had a sudden onset of numbness involving right side of her body without any motor deficits, dysarthria, dysphagia. 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 Per chart for her f/u neuro visit:Left ischemic thalamic/internal capsule infarct presenting with right-sided numbness, weakness - now with residual cognitive fog (improving), imbalance (improving), fatigue, right first + second digit numbness, in patient with known vascular risk factors of hypertension, dyslipidemia, lack of physical activity, snoring  PRECAUTIONS: None  SUBJECTIVE:  Pt. reports having had a very busy weekend full of birthday, and graduation parties.  PAIN:  Are you having pain? Yes: {yes pain: NPRS scale: 1/10, Pain location: right shoulder. Aggravating factors: Movement initially. Relieving factors: stretching.      OBJECTIVE:   OT TREATMENT     Pt. worked on right hand [facility] skills grasping 1" sticks, 1/4" collars, and 1/4" washers. Pt. worked on storing the objects in the palm, and translatory skills moving the items from the palm of the hand to the tip of the 2nd digit, and thumb.  Pt. worked on removing the 1/4" flat washers while alternating thumb opposition to the tip of her 2nd through 5th  digits. Pt. worked on removing the pegs using bilateral alternating hand patterns.  Pt. worked on [employer] for bilateral shoulder flexion at the tabletop, and progressed to a medium incline wedge for shoulder flexion, and scaption, followed by a large incline wedge for shoulder flexion. Pt. worked on bilateral shoulder shoulder, and chest presses with 1# weight following moist heat.  Pt. reports having mopped her kitchen last night, and experienced following. Pt. reports that she  iced it, which helped. Pt. reports 1/10 shoulder pain today. Pt. tolerated right shoulder ROM, and strengthening exercises with following moist heat. Pt. presented with difficulty removing 1/4" collars from the 1" sticks. FOTO score 64. Pt. continues to work on improving RUE strength to improve ADL, and IADL functioning including: using RUE for functional reaching while retrieving weighted items from higher higher shelves, and pushing shopping carts.  Measurements   Right   Shoulder flexion: 145 Shoulder Abduction: 125 Wrist extension 45  Strength:  Shoulder flexion: 4/5 Abduction 4/5 Elbow flexion/extension 5/5  Grip strength: 42# Pinch strength: 16# 3Pt.: 12#  [place], 9-hole peg test: 25 sec.   PATIENT EDUCATION: Education details: RUE functioning Person educated: Patient Education method: Explanation, Demonstration, and Verbal cues Education comprehension: verbalized understanding, returned demonstration, tactile cues required, and needs further education   HOME EXERCISE PROGRAM Continue ongoing HEPs for the RUE     OT Long Term Goals - 07/08/21 1024       OT LONG TERM GOAL #1   Title Patient will be independent with home exercise program    Baseline 08/17/2021: Independent 03/23/2021: Pt. is independent, 4/5: will continue to upgrade exercises5/03/2021: Independent 07/08/2021: Independent with HEPs    Time 12    Period Weeks    Status On-going    Target Date 09/30/21      OT LONG TERM GOAL #4   Title Patient will increase right grip strength by 10 pounds to be able to open jars and containers with modified independence    Baseline 28 pounds at eval and difficulty with managing jars and containers. 10th visit: R: 34# Jars, and containers continue to be difficulty to open.  4/5: right grip 50# 05/20/2021: right grip strength 40# 07/08/2021: R: 49# 08/17/2021: 42#   Time 12    Period Weeks    Status Partially Met    Target Date 09/30/21      OT LONG TERM GOAL #6   Title  Patient will decrease right shoulder pain to 2 out of 10 or less with movement patterns for daily activities.    Baseline Increased pain in right shoulder, 5 out of 10 with movement. 10th visit: 8/10 right shoulder pain, 4/5:  2/10 with movement today but has increased pain other days and not yet consistent.5/03: 0/10 during movement today. 07/08/2021: 0/10 pain in the right shoulder 08/17/2021: 1/10   Time 12    Period Weeks    Status Partially Met    Target Date 09/30/21      OT LONG TERM GOAL #8   Title Will assess for potential return to work tasks as patient progresses with therapy    Baseline Patient currently unable to perform her job as outlined in her job description    Time 12    Period Weeks    Status Deferred      OT [place]  #9   TITLE Pt will improve strength to be able to reach up to place items into closets, and cabinets independently    Baseline  08/17/2021: Pt. Is a able to reach up, however is not efficient, takes increased time, and is unable to grasp weighted objects from shelves. 07/08/2021:pt. has difficulty reaching up to place items in closets, and cabinets. 05/20/2021: Pt. continues to have difficulty 4/5: unable    Time 12    Period Weeks    Status Revised    Target Date 09/30/21      OT LONG TERM GOAL  #10   TITLE When on the passenger side, pt will be able to close door with modified independence    Baseline 07/08/2021: Met 4/5:  difficulty at times    Time 6    Period Weeks    Status Achieved      OT LONG TERM GOAL  #11   TITLE Pt will demonstrate ability to vacuum carpet of 2 rooms with use of right UE and no rest breaks    Baseline 08/17/2021: Independent, and efficient. 07/08/2021: Independent without rest breaks. 4/5:  difficulty with using vacuum. 5/03: Pt. has progressed and is now able to vacuum multiple rooms in her home    Time 6    Period Weeks    Status Achieved   Target Date 09/30/21      OT LONG TERM GOAL  #13   TITLE Pt will improve  strength to be able to push a full grocery cart   Baseline 08/17/2021: Pt. Has difficulty pushing a full size grocery cart. 07/08/2021: Pt. continues to have difficulty picking up a bag of fertilizer. 4/5: unable    Time 12    Period Weeks    Status revised   Target Date 07/15/21                Plan - 07/27/21 1258     Clinical Impression Statement Pt. reports having mopped her kitchen last night, and experienced following. Pt. reports that she iced it, which helped. Pt. reports 1/10 shoulder pain today. Pt. tolerated right shoulder ROM, and strengthening exercises with following moist heat. Pt. presented with difficulty removing 1/4" collars from the 1" sticks. Pt. continues to work on improving RUE strength to improve ADL, and IADL functioning including: using RUE for functional reaching while retrieving weighted items from higher higher shelves, and pushing shopping carts.                   OT Occupational Profile and History Problem Focused Assessment - Including review of records relating to presenting problem    Body Structure / Function / Physical Skills ADL;Dexterity;ROM;Strength;Coordination;FMC;IADL;Pain;Sensation;UE functional use;Decreased knowledge of use of DME    Cognitive Skills Memory;Safety Awareness    Psychosocial Skills Environmental  Adaptations;Habits;Routines and Behaviors    Rehab Potential Good    Clinical Decision Making Several treatment options, min-mod task modification necessary    Comorbidities Affecting Occupational Performance: May have comorbidities impacting occupational performance    Modification or Assistance to Complete Evaluation  No modification of tasks or assist necessary to complete eval    OT Frequency 2x / week    OT Duration 12 weeks    OT Treatment/Interventions Self-care/ADL training;Cryotherapy;Paraffin;Therapeutic exercise;DME and/or AE instruction;Cognitive remediation/compensation;Neuromuscular education;Manual Therapy;Moist  Heat;Contrast Bath;Therapeutic activities;Patient/family education    Consulted and Agree with Plan of Care Patient              Harrel Carina, MS, OTR/L   Harrel Carina, OT 08/19/2021, 9:47 AM

## 2021-08-19 NOTE — Therapy (Signed)
OUTPATIENT PHYSICAL THERAPY TREATMENT NOTE  Patient Name: Kim Gomez MRN: [id number] DOB:May 16, 1955, 66 y.o., female Today's Date: 08/19/2021  PCP: Gladstone Lighter, MD REFERRING PROVIDER: Vladimir Crofts, MD   PT End of Session - 08/19/21 1110     Visit Number 29    Number of Visits 33    Date for PT Re-Evaluation 09/16/21    Authorization Time Period Recert 09/25/9890-02/06/4172    Progress Note Due on Visit 30    PT Start Time 0848    PT Stop Time 0928    PT Time Calculation (min) 40 min    Equipment Utilized During Treatment Gait belt    Activity Tolerance Patient tolerated treatment well    Behavior During Therapy WFL for tasks assessed/performed                 Past Medical History:  Diagnosis Date   Anxiety    Arthritis    legs, hands   Cellulitis of right knee    started on antibiotics 06/21/15.     Depression    Family history of adverse reaction to anesthesia    sister and mom -PONV   Hypercholesteremia    Hypertension    Past Surgical History:  Procedure Laterality Date   ABDOMINAL HYSTERECTOMY     COLONOSCOPY WITH PROPOFOL N/A 06/27/2015   Procedure: COLONOSCOPY WITH PROPOFOL;  Surgeon: Lucilla Lame, MD;  Location: [place];  Service: Endoscopy;  Laterality: N/A;   POLYPECTOMY  06/27/2015   Procedure: POLYPECTOMY;  Surgeon: Lucilla Lame, MD;  Location: [place];  Service: Endoscopy;;   TOTAL HIP ARTHROPLASTY Right 09/10/2019   Procedure: TOTAL HIP ARTHROPLASTY ANTERIOR APPROACH;  Surgeon: Lovell Sheehan, MD;  Location: [facility];  Service: Orthopedics;  Laterality: Right;   Patient Active Problem List   Diagnosis Date Noted   Acute stroke due to ischemia ([place]) 08/10/2020   History of total hip replacement, right 09/10/2019   Special screening for malignant neoplasms, colon    Benign neoplasm of ascending colon    Benign neoplasm of descending colon     REFERRING DIAG: I63.9 (ICD-10-CM) - Stroke ([place])  THERAPY DIAG:   Muscle weakness (generalized)  Stiffness of right shoulder, not elsewhere classified  Chronic right shoulder pain  Rationale for Evaluation and Treatment Rehabilitation  PERTINENT HISTORY: 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 Pt reports a fall on October 29 th. Pt reports she hurt the R UE with the fall and she reports some stiffness in the knee as well as general weakness on the right side. Pt reports she has had 2 strokes but the first went under the radar and was only evidenced by imaging presented to her by her MD. Pt reports she has been modifying her activities in order to be careful not to fall since the stroke. Reports she has been taking her time with a lot of things. Pt reports she has a membership at [employer] and she has been walking on the treadmill 2-3 times per week, she reports she walks on the treadmill for an hour at a time following working her way up. Pt reports ocassional numbness and tingling in her hands.   PRECAUTIONS: none  SUBJECTIVE: Pt states, "my shoulder is good today." Rates it a 1/10. Had a good weekend. Her shoulder did not bother her much. Able to reach overhead, but slowly.  PAIN:  Are you having pain? Yes:  NPRS scale: 1/10 Pain location: Right anterior shoulder  Pain description: "stiff" Aggravating factors: prolonged positioning  Relieving factors: PT exercises      TODAY'S TREATMENT:  08/19/21   INTERVENTIONS  08/19/2021: Manual RUE distraction 4x10 sec bouts RUE flexion/abduction stretching 2x30-50 sec each   Therex:  Isometrics RUE: 10x3 sec holds for ext, IR, ER.   Serratus punch with perturbations: 1x60 sec. Rates medium  AROM flexion RUE 10x supine  4# weighted bar shoulder flex B 10x 1 sets, 5x 1 set. Rates medium, reports feels a good stretch with intervention  YTB RUE IR 2x10  Supine bicep curl 2# 10x2 sets. Pt rates medium.  YTB  pull-aparts/ER  10x  Shoulder circles CW/CC 10x each direction   Shrugs B 10x  AROM flex and abduction 8-10x B for each. Mirror cue  AROM ER 10x. Mirror cue   Reports fatigue in RUE at end of session but no increased pain.   PATIENT EDUCATION Education details: Exercise technique, body mechanics Person educated: Patient Education method: Explanation, Demonstration, Tactile cues, and Verbal cues Education comprehension: verbalized understanding, returned demonstration, and verbal cues required   HOME EXERCISE PROGRAM: No changes as of 08/19/2021, pt to continue HEP as previously given   PT Short Term Goals        PT SHORT TERM GOAL #1   Title Patient will be independent in home exercise program to improve strength/mobility for better functional independence with ADLs.    Baseline no HEP at this time    Time 4    Period Weeks    Status Achieved    Target Date 02/17/21      PT SHORT TERM GOAL #2   Title Patient will be independent in progressive shoulder home exercise program in order to improve her shoulder strength and function    Baseline Patient has initial home exercise program and is comfortable with that but it is yet to be progressed for higher level activities ; 7/26: working toward independence   Time 2   Period Weeks    Status New    Target Date 08/26/2021              PT Long Term Goals        PT LONG TERM GOAL #1   Title Patient will increase FOTO score to equal to or greater than  50   to demonstrate statistically significant improvement in mobility and quality of life.    Baseline 41 on 01/20/21    Time 8    Period Weeks    Status Achieved    Target Date 03/17/21      PT LONG TERM GOAL #2   Title Pt will improve mini BEST test by 4 points or more in order to indicate clinically significant improvent in balance.    Baseline see flowsheets 27 on 2/27    Time 12    Period Weeks    Status Achieved    Target Date 04/14/21      PT LONG TERM GOAL #3    Title Patient  will complete five times sit to stand test in < 15 seconds indicating an increased LE strength and improved balance.    Baseline 13.9 sec on 2/27    Time 12    Period Weeks    Status Achieved    Target Date 04/14/21      PT LONG TERM GOAL #4   Title Patient will increase 10 meter walk test to >1.41ms as to improve  gait speed for better community ambulation and to reduce fall risk.    Baseline .86 m/s on 01/20/21 2/27: 1.27 m/s    Time 8    Period Weeks    Status Achieved    Target Date 03/17/21      PT LONG TERM GOAL #5   Title Pt will improve dual task TUG to less than 20 seconds in order to indicate improved cognitive and motor tasks.    Baseline 28.82 on 01/20/21 14.98 sec on 2/27    Time 12    Period Weeks    Status Achieved    Target Date 04/14/21      Additional Long Term Goals   Additional Long Term Goals Yes      PT LONG TERM GOAL #6   Title Patient will improve right shoulder by 1 increment on manual muscle test grading scale in order to indicate improved right upper extremity strength.    Baseline 3-/5 her right shoulder flexion and abduction(due to range of motion restriction) 3+ with external rotation at 0 degrees abduction; 05/18/2021=Access Code: [email or web address]  URL: [email or web address] ; 7/26: grossly 4+/5 with exception of flexion and IR which are both 4-/5   Time 12    Period Weeks    Status Achieved   Target Date 08/10/21      PT LONG TERM GOAL #7   Title Patient will improve QuickDASH score by 12.5 points in order to indicate significantly improved subjective rating of right shoulder function    Baseline 50% on 03/23/2021; 05/18/2021= 23%    Time 8    Period Weeks    Status Achieved    Target Date 05/18/21      PT LONG TERM GOAL #8   Title Patient will improve right shoulder active range of motion by 30 degrees with elevation and by 15 degrees with external rotation in order to indicate improved shoulder function for overhead activities  and for self-care activities    Baseline 90 degrees flexion range of motion , 85 degrees abduction range of motion, 25 degrees external rotation range of motion ( all AROM); 05/18/2021=R Shoulder AROM: Flex= 158 deg; ABD= 152; ER= 44    Time 8    Period Weeks    Status Achieved    Target Date 05/18/21      PT LONG TERM GOAL  #9   TITLE Patient will improve right shoulder passive range of motion by 30 degrees of elevation by 10 degrees with external rotation in order to indicate improved right shoulder range of motion for progressive functional activities    Baseline 120 degrees flexion, 123 degrees abduction, 50 degrees external rotation at 45 degrees abduction on 03/23/2021; 05/18/2021=R Shoulder PROM: Flex= 164 deg; ABD= 160; ER= 52; 7/26: Flex 150 deg, ABD 110 deg *pain limited when tested, ER 49 deg.   Time 5   Period Weeks    Status Partially Met    Target Date 09/16/2021      PT LONG TERM GOAL  #10   TITLE Patient will improve right shoulder active range of motion by 15 degrees with elevation and by 10 degrees with external rotation in order to indicate improved shoulder function for overhead activities including reaching for 2nd and 3rd shelf in kitchen cabinet and for self-care activities    Baseline 05/18/2021=R Shoulder AROM: Flex= 158 deg; ABD= 152; ER= 44 and Patient reports difficulty reaching overhead onto 2nd and 3rd shelf at home; 7/26: 145 deg flex, 143 deg abduction, 40 deg  Time 5    Period Weeks    Status New    Target Date 09/16/2021              Plan -     Clinical Impression Statement Pt continues to report improvements with RUE mobility. Focused on flexion and IR strength of R shoulder. Utilized Geologist, engineering for additional cuing with interventions. Pt able to observe differences between R and L UE movement and make corrections as able. RUE still does fatigue quickly, and dose of exercises modified as a result. The pt will continue to benefit from skilled PT in order to  improve shoulder function and QOL.    Personal Factors and Comorbidities Age;Comorbidity 1;Comorbidity 2    Comorbidities arthritis, anxiety, HTN, hyperlipedemia.    Examination-Activity Limitations Carry;Lift;Locomotion Level    Examination-Participation Restrictions Cleaning;Yard Work;Meal Prep    Stability/Clinical Decision Making Stable/Uncomplicated    Rehab Potential Good    PT Frequency 1x / week    PT Duration 5 weeks    PT Treatment/Interventions ADLs/Self Care Home Management;Gait training;Therapeutic activities;Functional mobility training;Therapeutic exercise;Balance training;Neuromuscular re-education;Manual techniques;Energy conservation;Passive range of motion;Cryotherapy;Electrical Stimulation;Moist Heat;Patient/family education;Dry needling;Joint Manipulations    PT Next Visit Plan shoulder strenghtening & ROM exercises, continue POC   PT Home Exercise Plan 05/18/2021: Access Code: [email or web address]  URL: [email or web address]  Access Code: [email or web address]  URL: [email or web address]; no updates    Consulted and Agree with Plan of Care Patient              Zollie Pee, PT 08/19/2021, 11:17 AM

## 2021-08-24 ENCOUNTER — Ambulatory Visit: Payer: No Typology Code available for payment source | Admitting: Physical Therapy

## 2021-08-24 ENCOUNTER — Encounter: Payer: Self-pay | Admitting: Occupational Therapy

## 2021-08-24 ENCOUNTER — Ambulatory Visit: Payer: No Typology Code available for payment source | Admitting: Speech Pathology

## 2021-08-24 ENCOUNTER — Ambulatory Visit: Payer: No Typology Code available for payment source | Admitting: Occupational Therapy

## 2021-08-24 DIAGNOSIS — R278 Other lack of coordination: Secondary | ICD-10-CM

## 2021-08-24 DIAGNOSIS — M6281 Muscle weakness (generalized): Secondary | ICD-10-CM

## 2021-08-24 NOTE — Therapy (Addendum)
Occupational Therapy Treatment Note   Patient Name: GENEVIVE PRINTUP MRN: [id number] DOB:February 24, 1955, 66 y.o., female 10 Date: 08/24/2021  PCP: Gladstone Lighter, MD REFERRING PROVIDER: Jennings Books, MD   OT End of Session - 08/24/21 1022     Visit Number 42    Number of Visits 65    Date for OT Re-Evaluation 09/30/21    Authorization Time Period Progress report period starting 08/17/2021    OT Start Time 0915    OT Stop Time 1000    OT Time Calculation (min) 45 min    Activity Tolerance Patient tolerated treatment well    Behavior During Therapy [facility] for tasks assessed/performed             Past Medical History:  Diagnosis Date   Anxiety    Arthritis    legs, hands   Cellulitis of right knee    started on antibiotics 06/21/15.     Depression    Family history of adverse reaction to anesthesia    sister and mom -PONV   Hypercholesteremia    Hypertension    Past Surgical History:  Procedure Laterality Date   ABDOMINAL HYSTERECTOMY     COLONOSCOPY WITH PROPOFOL N/A 06/27/2015   Procedure: COLONOSCOPY WITH PROPOFOL;  Surgeon: Lucilla Lame, MD;  Location: [place];  Service: Endoscopy;  Laterality: N/A;   POLYPECTOMY  06/27/2015   Procedure: POLYPECTOMY;  Surgeon: Lucilla Lame, MD;  Location: [place];  Service: Endoscopy;;   TOTAL HIP ARTHROPLASTY Right 09/10/2019   Procedure: TOTAL HIP ARTHROPLASTY ANTERIOR APPROACH;  Surgeon: Lovell Sheehan, MD;  Location: [facility];  Service: Orthopedics;  Laterality: Right;   Patient Active Problem List   Diagnosis Date Noted   Acute stroke due to ischemia ([place]) 08/10/2020   History of total hip replacement, right 09/10/2019   Special screening for malignant neoplasms, colon    Benign neoplasm of ascending colon    Benign neoplasm of descending colon      REFERRING DIAG: CVA  THERAPY DIAG:  Muscle weakness (generalized)  Other lack of coordination  Rationale for Evaluation and Treatment  Rehabilitation  PERTINENT HISTORY: Pt. is a 66 y.o. female with PMH significant for HTN, HLD, anxiety disorder, recently hospitalized for COVID-19 infection (08/06/2020) at an outside facility. Patient was brought to the ED on 7/24 after she had a sudden onset of numbness involving right side of her body without any motor deficits, dysarthria, dysphagia. 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 Per chart for her f/u neuro visit:Left ischemic thalamic/internal capsule infarct presenting with right-sided numbness, weakness - now with residual cognitive fog (improving), imbalance (improving), fatigue, right first + second digit numbness, in patient with known vascular risk factors of hypertension, dyslipidemia, lack of physical activity, snoring  PRECAUTIONS: None  SUBJECTIVE:  Pt. reports having had a very busy weekend full of birthday, and graduation parties.  PAIN:  Are you having pain? Yes: {yes pain: NPRS scale: 1/10, Pain location: right shoulder. Aggravating factors: Movement initially. Relieving factors: stretching.      OBJECTIVE:   OT TREATMENT     Pt. worked on [facility] skills using the [employer] Task. Pt. worked on sustaining grasp on the resistive tweezers while grasping this sticks, and moving them from a horizontal position to a vertical position to prepare for placing them into the pegboard. Pt. required verbal cues, and cues for visual demonstration for wrist position, and hand pattern when placing them  into the pegboard. Pt. worked removing the 1" thin pegs while alternating thumb opposition to the 2nd through 5th digits. Pt. worked on removing the pegs while grasping, and storing them I the palm of her right hand.  Pt. worked on [employer] shoulder flexion, and scaption at the tabletop surface then progressing to medium, and large incline wedges.   Pt. reports having had elevated BP at her MD visit last Friday.  Pt. reports that she was started on a second round of antibiotics. Pt.'s BP was 152/90 during the OT session today with HR 84 bpms. Pt. reports 3/10 shoulder pain today. Pt. tolerated right shoulder ROM following moist heat. Pt. was able to complete the tweezer task placing the 1" stick in to the pegboard while the pegboard in flat, or at a slight incline. Pt. had more difficulty, and dropped several when the board is position at an elevated vertical plane requiring sustained shoulder flexion with [place]. Pt. continues to work on improving RUE strength to improve ADL, and IADL functioning including: using RUE for functional reaching while retrieving weighted items from higher higher shelves, and pushing shopping carts.  Measurements   Right   Shoulder flexion: 145 Shoulder Abduction: 125 Wrist extension 45  Strength:  Shoulder flexion: 4/5 Abduction 4/5 Elbow flexion/extension 5/5  Grip strength: 42# Pinch strength: 16# 3Pt.: 12#  [place], 9-hole peg test: 25 sec.   PATIENT EDUCATION: Education details: RUE functioning Person educated: Patient Education method: Explanation, Demonstration, and Verbal cues Education comprehension: verbalized understanding, returned demonstration, tactile cues required, and needs further education   HOME EXERCISE PROGRAM Continue ongoing HEPs for the RUE     OT Long Term Goals - 07/08/21 1024       OT LONG TERM GOAL #1   Title Patient will be independent with home exercise program    Baseline 08/17/2021: Independent 03/23/2021: Pt. is independent, 4/5: will continue to upgrade exercises5/03/2021: Independent 07/08/2021: Independent with HEPs    Time 12    Period Weeks    Status On-going    Target Date 09/30/21      OT LONG TERM GOAL #4   Title Patient will increase right grip strength by 10 pounds to be able to open jars and containers with modified independence    Baseline 28 pounds at eval and difficulty with managing jars and containers. 10th  visit: R: 34# Jars, and containers continue to be difficulty to open.  4/5: right grip 50# 05/20/2021: right grip strength 40# 07/08/2021: R: 49# 08/17/2021: 42#   Time 12    Period Weeks    Status Partially Met    Target Date 09/30/21      OT LONG TERM GOAL #6   Title Patient will decrease right shoulder pain to 2 out of 10 or less with movement patterns for daily activities.    Baseline Increased pain in right shoulder, 5 out of 10 with movement. 10th visit: 8/10 right shoulder pain, 4/5:  2/10 with movement today but has increased pain other days and not yet consistent.5/03: 0/10 during movement today. 07/08/2021: 0/10 pain in the right shoulder 08/17/2021: 1/10   Time 12    Period Weeks    Status Partially Met    Target Date 09/30/21      OT LONG TERM GOAL #8   Title Will assess for potential return to work tasks as patient progresses with therapy    Baseline Patient currently unable to perform her job as outlined in her job description  Time 12    Period Weeks    Status Deferred      OT LONG TERM GOAL  #9   TITLE Pt will improve strength to be able to reach up to place items into closets, and cabinets independently    Baseline 08/17/2021: Pt. Is a able to reach up, however is not efficient, takes increased time, and is unable to grasp weighted objects from shelves. 07/08/2021:pt. has difficulty reaching up to place items in closets, and cabinets. 05/20/2021: Pt. continues to have difficulty 4/5: unable    Time 12    Period Weeks    Status Revised    Target Date 09/30/21      OT LONG TERM GOAL  #10   TITLE When on the passenger side, pt will be able to close door with modified independence    Baseline 07/08/2021: Met 4/5:  difficulty at times    Time 6    Period Weeks    Status Achieved      OT LONG TERM GOAL  #11   TITLE Pt will demonstrate ability to vacuum carpet of 2 rooms with use of right UE and no rest breaks    Baseline 08/17/2021: Independent, and efficient. 07/08/2021:  Independent without rest breaks. 4/5:  difficulty with using vacuum. 5/03: Pt. has progressed and is now able to vacuum multiple rooms in her home    Time 6    Period Weeks    Status Achieved   Target Date 09/30/21      OT LONG TERM GOAL  #13   TITLE Pt will improve strength to be able to push a full grocery cart   Baseline 08/17/2021: Pt. Has difficulty pushing a full size grocery cart. 07/08/2021: Pt. continues to have difficulty picking up a bag of fertilizer. 4/5: unable    Time 12    Period Weeks    Status revised   Target Date 07/15/21                Plan - 07/27/21 1258     Clinical Impression Statement Pt. reports having had elevated BP at her MD visit last Friday. Pt. reports that she was started on a second round of antibiotics. Pt.'s BP was 152/90 during the OT session today with HR 84 bpms. Pt. reports 3/10 shoulder pain today. Pt. tolerated right shoulder ROM following moist heat. Pt. was able to complete the tweezer task placing the 1" stick in to the pegboard while the pegboard in flat, or at a slight incline. Pt. had more difficulty, and dropped several when the board is position at an elevated vertical plane requiring sustained shoulder flexion with [place]. Pt. continues to work on improving RUE strength to improve ADL, and IADL functioning including: using RUE for functional reaching while retrieving weighted items from higher higher shelves, and pushing shopping carts.                   OT Occupational Profile and History Problem Focused Assessment - Including review of records relating to presenting problem    Body Structure / Function / Physical Skills ADL;Dexterity;ROM;Strength;Coordination;FMC;IADL;Pain;Sensation;UE functional use;Decreased knowledge of use of DME    Cognitive Skills Memory;Safety Awareness    Psychosocial Skills Environmental  Adaptations;Habits;Routines and Behaviors    Rehab Potential Good    Clinical Decision Making Several treatment  options, min-mod task modification necessary    Comorbidities Affecting Occupational Performance: May have comorbidities impacting occupational performance    Modification or Assistance to Complete Evaluation  No modification of tasks or assist necessary to complete eval    OT Frequency 2x / week    OT Duration 12 weeks    OT Treatment/Interventions Self-care/ADL training;Cryotherapy;Paraffin;Therapeutic exercise;DME and/or AE instruction;Cognitive remediation/compensation;Neuromuscular education;Manual Therapy;Moist Heat;Contrast Bath;Therapeutic activities;Patient/family education    Consulted and Agree with Plan of Care Patient              Harrel Carina, MS, OTR/L   Harrel Carina, OT 08/24/2021, 10:25 AM

## 2021-08-26 ENCOUNTER — Ambulatory Visit: Payer: No Typology Code available for payment source

## 2021-08-26 ENCOUNTER — Ambulatory Visit: Payer: No Typology Code available for payment source | Admitting: Occupational Therapy

## 2021-08-26 ENCOUNTER — Ambulatory Visit: Payer: No Typology Code available for payment source | Admitting: Speech Pathology

## 2021-08-26 ENCOUNTER — Encounter: Payer: Self-pay | Admitting: Occupational Therapy

## 2021-08-26 DIAGNOSIS — M6281 Muscle weakness (generalized): Secondary | ICD-10-CM

## 2021-08-26 DIAGNOSIS — G8929 Other chronic pain: Secondary | ICD-10-CM

## 2021-08-26 DIAGNOSIS — M25611 Stiffness of right shoulder, not elsewhere classified: Secondary | ICD-10-CM

## 2021-08-26 DIAGNOSIS — R278 Other lack of coordination: Secondary | ICD-10-CM

## 2021-08-26 DIAGNOSIS — R41841 Cognitive communication deficit: Secondary | ICD-10-CM

## 2021-08-26 NOTE — Therapy (Addendum)
Occupational Therapy Treatment Note   Patient Name: JEZEL BASTO MRN: [id number] DOB:12-Aug-1955, 66 y.o., female 40 Date: 08/26/2021  PCP: Gladstone Lighter, MD REFERRING PROVIDER: Jennings Books, MD   OT End of Session - 08/26/21 1633     Visit Number 43    Number of Visits 66    Date for OT Re-Evaluation 09/30/21    Authorization Time Period Progress report period starting 08/17/2021    OT Start Time 1007    OT Stop Time 1045    OT Time Calculation (min) 38 min    Activity Tolerance Patient tolerated treatment well    Behavior During Therapy [facility] for tasks assessed/performed             Past Medical History:  Diagnosis Date   Anxiety    Arthritis    legs, hands   Cellulitis of right knee    started on antibiotics 06/21/15.     Depression    Family history of adverse reaction to anesthesia    sister and mom -PONV   Hypercholesteremia    Hypertension    Past Surgical History:  Procedure Laterality Date   ABDOMINAL HYSTERECTOMY     COLONOSCOPY WITH PROPOFOL N/A 06/27/2015   Procedure: COLONOSCOPY WITH PROPOFOL;  Surgeon: Lucilla Lame, MD;  Location: [place];  Service: Endoscopy;  Laterality: N/A;   POLYPECTOMY  06/27/2015   Procedure: POLYPECTOMY;  Surgeon: Lucilla Lame, MD;  Location: [place];  Service: Endoscopy;;   TOTAL HIP ARTHROPLASTY Right 09/10/2019   Procedure: TOTAL HIP ARTHROPLASTY ANTERIOR APPROACH;  Surgeon: Lovell Sheehan, MD;  Location: [facility];  Service: Orthopedics;  Laterality: Right;   Patient Active Problem List   Diagnosis Date Noted   Acute stroke due to ischemia ([place]) 08/10/2020   History of total hip replacement, right 09/10/2019   Special screening for malignant neoplasms, colon    Benign neoplasm of ascending colon    Benign neoplasm of descending colon      REFERRING DIAG: CVA  THERAPY DIAG:  Muscle weakness (generalized)  Other lack of coordination  Rationale for Evaluation and Treatment  Rehabilitation  PERTINENT HISTORY: Pt. is a 66 y.o. female with PMH significant for HTN, HLD, anxiety disorder, recently hospitalized for COVID-19 infection (08/06/2020) at an outside facility. Patient was brought to the ED on 7/24 after she had a sudden onset of numbness involving right side of her body without any motor deficits, dysarthria, dysphagia. 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 Per chart for her f/u neuro visit:Left ischemic thalamic/internal capsule infarct presenting with right-sided numbness, weakness - now with residual cognitive fog (improving), imbalance (improving), fatigue, right first + second digit numbness, in patient with known vascular risk factors of hypertension, dyslipidemia, lack of physical activity, snoring  PRECAUTIONS: None  SUBJECTIVE:  Pt. reports having had a very busy weekend full of birthday, and graduation parties.  PAIN:  Are you having pain? Yes: 1-2/10, Pain location: right shoulder. Aggravating factors: Movement initially. Relieving factors: stretching.      OBJECTIVE:   OT TREATMENT     Pt. worked on tasks to sustain right lateral pinch on resistive tweezers while grasping and moving 2" toothpick sticks from a horizontal flat position to a vertical position in order to place it in the holder. Pt. was able to sustain grasp while positioning and extending the wrist/hand in the necessary alignment needed to place the 2" sticks through the top of the holder. Pt.  worked on sustaining bilateral shoulders in elevation while connecting Pipe designs at progressively increasing heights following design moderately difficult patterns.   Pt. reports less pain today at 1-2/10 while working with her RUE in elevation. Pt. was able to sustain lateral grasp on the tweezers while extending her wrist in preparation for placing them into the container. Pt. was able to sustain her RUE in elevation while  connecting the PCP pipe pieces following the design patterns. Pt. Required cues for the task initially, and required multiple rest breaks. Pt. continues to work on improving RUE strength to improve ADL, and IADL functioning including: using RUE for functional reaching while retrieving weighted items from higher higher shelves, and pushing shopping carts.  Measurements   Right   Shoulder flexion: 145 Shoulder Abduction: 125 Wrist extension 45  Strength:  Shoulder flexion: 4/5 Abduction 4/5 Elbow flexion/extension 5/5  Grip strength: 42# Pinch strength: 16# 3Pt.: 12#  [place], 9-hole peg test: 25 sec.   PATIENT EDUCATION: Education details: RUE functioning Person educated: Patient Education method: Explanation, Demonstration, and Verbal cues Education comprehension: verbalized understanding, returned demonstration, tactile cues required, and needs further education   HOME EXERCISE PROGRAM Continue ongoing HEPs for the RUE     OT Long Term Goals - 07/08/21 1024       OT LONG TERM GOAL #1   Title Patient will be independent with home exercise program    Baseline 08/17/2021: Independent 03/23/2021: Pt. is independent, 4/5: will continue to upgrade exercises5/03/2021: Independent 07/08/2021: Independent with HEPs    Time 12    Period Weeks    Status On-going    Target Date 09/30/21      OT LONG TERM GOAL #4   Title Patient will increase right grip strength by 10 pounds to be able to open jars and containers with modified independence    Baseline 28 pounds at eval and difficulty with managing jars and containers. 10th visit: R: 34# Jars, and containers continue to be difficulty to open.  4/5: right grip 50# 05/20/2021: right grip strength 40# 07/08/2021: R: 49# 08/17/2021: 42#   Time 12    Period Weeks    Status Partially Met    Target Date 09/30/21      OT LONG TERM GOAL #6   Title Patient will decrease right shoulder pain to 2 out of 10 or less with movement patterns for daily  activities.    Baseline Increased pain in right shoulder, 5 out of 10 with movement. 10th visit: 8/10 right shoulder pain, 4/5:  2/10 with movement today but has increased pain other days and not yet consistent.5/03: 0/10 during movement today. 07/08/2021: 0/10 pain in the right shoulder 08/17/2021: 1/10   Time 12    Period Weeks    Status Partially Met    Target Date 09/30/21      OT LONG TERM GOAL #8   Title Will assess for potential return to work tasks as patient progresses with therapy    Baseline Patient currently unable to perform her job as outlined in her job description    Time 12    Period Weeks    Status Deferred      OT [place]  #9   TITLE Pt will improve strength to be able to reach up to place items into closets, and cabinets independently    Baseline 08/17/2021: Pt. Is a able to reach up, however is not efficient, takes increased time, and is unable to grasp weighted objects from  shelves. 07/08/2021:pt. has difficulty reaching up to place items in closets, and cabinets. 05/20/2021: Pt. continues to have difficulty 4/5: unable    Time 12    Period Weeks    Status Revised    Target Date 09/30/21      OT LONG TERM GOAL  #10   TITLE When on the passenger side, pt will be able to close door with modified independence    Baseline 07/08/2021: Met 4/5:  difficulty at times    Time 6    Period Weeks    Status Achieved      OT LONG TERM GOAL  #11   TITLE Pt will demonstrate ability to vacuum carpet of 2 rooms with use of right UE and no rest breaks    Baseline 08/17/2021: Independent, and efficient. 07/08/2021: Independent without rest breaks. 4/5:  difficulty with using vacuum. 5/03: Pt. has progressed and is now able to vacuum multiple rooms in her home    Time 6    Period Weeks    Status Achieved   Target Date 09/30/21      OT LONG TERM GOAL  #13   TITLE Pt will improve strength to be able to push a full grocery cart   Baseline 08/17/2021: Pt. Has difficulty pushing a  full size grocery cart. 07/08/2021: Pt. continues to have difficulty picking up a bag of fertilizer. 4/5: unable    Time 12    Period Weeks    Status revised   Target Date 07/15/21                Plan - 07/27/21 1258     Clinical Impression Statement Pt. reports less pain today at 1-2/10 while working with her RUE in elevation. Pt. was able to sustain lateral grasp on the tweezers while extending her wrist in preparation for placing them into the container. Pt. was able to sustain her RUE in elevation while connecting the PCP pipe pieces following the design patterns. Pt. required cues for the task initially, and required multiple rest breaks. Pt. continues to work on improving RUE strength to improve ADL, and IADL functioning including: using RUE for functional reaching while retrieving weighted items from higher higher shelves, and pushing shopping carts.                  OT Occupational Profile and History Problem Focused Assessment - Including review of records relating to presenting problem    Body Structure / Function / Physical Skills ADL;Dexterity;ROM;Strength;Coordination;FMC;IADL;Pain;Sensation;UE functional use;Decreased knowledge of use of DME    Cognitive Skills Memory;Safety Awareness    Psychosocial Skills Environmental  Adaptations;Habits;Routines and Behaviors    Rehab Potential Good    Clinical Decision Making Several treatment options, min-mod task modification necessary    Comorbidities Affecting Occupational Performance: May have comorbidities impacting occupational performance    Modification or Assistance to Complete Evaluation  No modification of tasks or assist necessary to complete eval    OT Frequency 2x / week    OT Duration 12 weeks    OT Treatment/Interventions Self-care/ADL training;Cryotherapy;Paraffin;Therapeutic exercise;DME and/or AE instruction;Cognitive remediation/compensation;Neuromuscular education;Manual Therapy;Moist Heat;Contrast  Bath;Therapeutic activities;Patient/family education    Consulted and Agree with Plan of Care Patient              Harrel Carina, MS, OTR/L   Harrel Carina, OT 08/26/2021, 4:37 PM

## 2021-08-26 NOTE — Therapy (Addendum)
OUTPATIENT SPEECH LANGUAGE PATHOLOGY TREATMENT NOTE AND RECERTIFICATION   Patient Name: Kim Gomez MRN: [id number] DOB:[birth year], 66 y.o., female Today's Date: 09/02/2021  PCP: Gladstone Lighter, MD REFERRING PROVIDER: Jennings Books, MD   End of Session - 08/26/21 0901       Visit Number 33    Number of Visits 63     Date for SLP Re-Evaluation 09/02/2021    Authorization - Visit Number 3     Progress Note Due on Visit 10     SLP Start Time 0900     SLP Stop Time  1000     SLP Time Calculation (min) 60 min     Activity Tolerance Patient tolerated treatment well           Past Medical History:  Diagnosis Date   Anxiety    Arthritis    legs, hands   Cellulitis of right knee    started on antibiotics 06/21/15.     Depression    Family history of adverse reaction to anesthesia    sister and mom -PONV   Hypercholesteremia    Hypertension    Past Surgical History:  Procedure Laterality Date   ABDOMINAL HYSTERECTOMY     COLONOSCOPY WITH PROPOFOL N/A 06/27/2015   Procedure: COLONOSCOPY WITH PROPOFOL;  Surgeon: Lucilla Lame, MD;  Location: [place];  Service: Endoscopy;  Laterality: N/A;   POLYPECTOMY  06/27/2015   Procedure: POLYPECTOMY;  Surgeon: Lucilla Lame, MD;  Location: [place];  Service: Endoscopy;;   TOTAL HIP ARTHROPLASTY Right 09/10/2019   Procedure: TOTAL HIP ARTHROPLASTY ANTERIOR APPROACH;  Surgeon: Lovell Sheehan, MD;  Location: [facility];  Service: Orthopedics;  Laterality: Right;   Patient Active Problem List   Diagnosis Date Noted   Acute stroke due to ischemia ([place]) 08/10/2020   History of total hip replacement, right 09/10/2019   Special screening for malignant neoplasms, colon    Benign neoplasm of ascending colon    Benign neoplasm of descending colon     ONSET DATE: 08/10/20  REFERRING DIAG: CVA, long covid   THERAPY DIAG:  Cognitive communication deficit  Rationale for Evaluation and Treatment  Rehabilitation  SUBJECTIVE: "Ta-da!" Pt opens her planner  PAIN:  Are you having pain? No     OBJECTIVE:   TODAY'S TREATMENT: Pt reports she is proud of herself for keeping up with her planner. Continued to follow schedule and make adjustments as needed when sick last week. Pt reports she is managing IADLs well; continues to plan meals and errands, preplan for Bible study and conversations with spouse with success. Targeted alternating attention and strategies with alphabetizing word lists task. Pt required extended time, however was able to generate solution to write alphabet to assist her. Pt also generate lists with min cue from SLP. Improved speed/accuracy using these techniques (98%). SLP educated on how to apply these strategies to daily tasks, such as cooking.   PATIENT EDUCATION: Education details: alternating attention strategies Person educated: Patient Education method: Explanation, Demonstration, and Verbal cues Education comprehension: verbalized understanding, returned demonstration, and needs further education   SLP Short Term Goals - 07/30/21 0843       SLP SHORT TERM GOAL #1   Title Pt will verbalize and demonstrate how to implement 2 memory and attention strategies to aid daily functioning given occasional min A over 2 sessions    Status Achieved      SLP SHORT TERM GOAL #2   Title Pt will manage finances with use  of compensations with no reported episodes of missed bills given rare min A    Status Achieved      SLP SHORT TERM GOAL #3   Title Pt will identify errors in writing and self-correct with 80% accuracy given rare min A over 2 sessions    Status Achieved      SLP SHORT TERM GOAL #4   Title Pt will participate in further language assessment as needed (goals to be added)    Status Achieved    LTGs = STGs          SLP Long Term Goals - 09/02/21 0920       SLP LONG TERM GOAL #1   Title Pt will verbalize and implement 4 attention and memory  strategies to aid daily functioning for ADLs and IADLs with rare min A over 2 sessions    Baseline 07/29/21: occasional min-mod cues    Time 12    Period Weeks    Status On-going      SLP LONG TERM GOAL #2   Title Pt will identify and correct paraphasias in conversation and writing with 90% accuracy given rare min A over 2 sessions    Time 12    Period Weeks    Status Achieved      SLP LONG TERM GOAL #3   Title Pt will use compensations for thought organization effectively in 20 minutes complex conversation.    Baseline mod complex conv achieved 5/3, goal revised 07/29/21: Having weekly planned conversations about feelings with spouse, cues for consistency necessary for Bible study planning    Time 12    Period Weeks    Status Ongoing     SLP LONG TERM GOAL #4   Title Patient will maintain alternating attention between 2 mod complex tasks for 15 minutes with modified independence (strategies allowed).    Baseline 07/29/21 min-mod cues necessary to use strategies    Time 12    Period Weeks    Status Ongoing     SLP LONG TERM GOAL #5   Title Patient will use strategies and executive function aid to preplan and manage IADLs independently.    Baseline 07/29/21: using planner to manage appointments; requires min-mod cues to preplan for meals    Time 12    Period Weeks    Status Ongoing               Plan - 07/15/21 1344     Clinical Impression Statement Patient continues with mild cognitive communication impairment. Pt continues to carryover of strategy to preplan for weekly conversations with spouse, and is now consistently preplanning to increase her participation in [place] study. Pt reports functional improvements and that she is pleased with her progress; she is maintaining her executive function aid independently at this time. She is progressing toward LTGs and is expected to meet goals next session, and is in agreement with d/c at that time. Recertification completed today for  one additional session as patient missed some sessions due to illness but is on track to meet LTGs. Continue skilled ST for one additional session for carryover of attention strategies and to maximize pt's cognitive communication and language skills to improve accuracy/independence with ADLs and IADLs, reduce communication breakdowns, and improve quality of life.     Speech Therapy Frequency 1x /week    Duration 12 weeks    Treatment/Interventions Language facilitation;Environmental controls;SLP instruction and feedback;Cognitive reorganization;Compensatory strategies;Patient/family education;Internal/external aids    Potential to Achieve Goals Good  SLP Home Exercise Plan see pt instructions    Consulted and Agree with Plan of Care Patient           Deneise Lever, MS, CCC-SLP Speech-Language Pathologist ([place], [place] 09/02/2021, 9:32 AM

## 2021-08-26 NOTE — Therapy (Signed)
OUTPATIENT PHYSICAL THERAPY TREATMENT NOTE/Physical Therapy Progress Note   Dates of reporting period  05/26/21   to   08/26/21   Patient Name: Kim Gomez RECORD MRN: [id number] DOB:1956/01/05, 66 y.o., female Today's Date: 08/26/2021  PCP: Gladstone Lighter, MD REFERRING PROVIDER: Vladimir Crofts, MD         Past Medical History:  Diagnosis Date   Anxiety    Arthritis    legs, hands   Cellulitis of right knee    started on antibiotics 06/21/15.     Depression    Family history of adverse reaction to anesthesia    sister and mom -PONV   Hypercholesteremia    Hypertension    Past Surgical History:  Procedure Laterality Date   ABDOMINAL HYSTERECTOMY     COLONOSCOPY WITH PROPOFOL N/A 06/27/2015   Procedure: COLONOSCOPY WITH PROPOFOL;  Surgeon: Lucilla Lame, MD;  Location: [place];  Service: Endoscopy;  Laterality: N/A;   POLYPECTOMY  06/27/2015   Procedure: POLYPECTOMY;  Surgeon: Lucilla Lame, MD;  Location: [place];  Service: Endoscopy;;   TOTAL HIP ARTHROPLASTY Right 09/10/2019   Procedure: TOTAL HIP ARTHROPLASTY ANTERIOR APPROACH;  Surgeon: Lovell Sheehan, MD;  Location: [facility];  Service: Orthopedics;  Laterality: Right;   Patient Active Problem List   Diagnosis Date Noted   Acute stroke due to ischemia ([place]) 08/10/2020   History of total hip replacement, right 09/10/2019   Special screening for malignant neoplasms, colon    Benign neoplasm of ascending colon    Benign neoplasm of descending colon     REFERRING DIAG: I63.9 (ICD-10-CM) - Stroke ([place])  THERAPY DIAG:  No diagnosis found.  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 Pt reports a fall on October 29 th. Pt reports she hurt the R UE with the fall and she reports some stiffness in the knee as well as general weakness on the right  side. Pt reports she has had 2 strokes but the first went under the radar and was only evidenced by imaging presented to her by her MD. Pt reports she has been modifying her activities in order to be careful not to fall since the stroke. Reports she has been taking her time with a lot of things. Pt reports she has a membership at [employer] and she has been walking on the treadmill 2-3 times per week, she reports she walks on the treadmill for an hour at a time following working her way up. Pt reports ocassional numbness and tingling in her hands.   PRECAUTIONS: none  SUBJECTIVE: Pt states no shoulder pain currently. Pt reports no other current concerns  PAIN:  Are you having pain? Yes: NPRS scale: 0/10 Pain location: Right anterior shoulder  Pain description: "stiff" Aggravating factors: prolonged positioning  Relieving factors: PT exercises      TODAY'S TREATMENT:  08/26/21   INTERVENTIONS  08/26/2021:   Therex: Heat donned to R shoulder while pt performs the following (reports heat feels good on shoulder throughout session)  On plinth, supine: RUE: AROM supine flexion, abduction 10x  for each  Abduction and flexion stretch 30-45  sec for each   5# weighted bar shoulder flex B 10x 1 sets, 6x 1 set, holds a few reps for up to 10 sec for additional stretch. Rates medium  Serratus punch with perturbations: 1x60 sec. Rates easy  Serratus  punch with 3# dumbbell 10x  ROM assessment completed (PROM and AROM, see goal section for details)  Scifit 2 min fwd/2 min bckwd lvl 1. Set up assist and cuing for technique. Monitoring throughout for response. No pain.  Standing isometrics at wall RUE: 10x3 sec holds for ext, flex RUE  PATIENT EDUCATION Education details: Goal reassessment, Exercise technique, body mechanics Person educated: Patient Education method: Explanation, Demonstration, Tactile cues, and Verbal cues Education comprehension: verbalized understanding, returned  demonstration, and verbal cues required   HOME EXERCISE PROGRAM: No changes as of 08/26/2021, pt to continue HEP as previously given   PT Short Term Goals        PT SHORT TERM GOAL #1   Title Patient will be independent in home exercise program to improve strength/mobility for better functional independence with ADLs.    Baseline no HEP at this time    Time 4    Period Weeks    Status Achieved    Target Date 02/17/21      PT SHORT TERM GOAL #2   Title Patient will be independent in progressive shoulder home exercise program in order to improve her shoulder strength and function    Baseline Patient has initial home exercise program and is comfortable with that but it is yet to be progressed for higher level activities ; 7/26: working toward independence   Time 2   Period Weeks    Status New    Target Date 08/26/2021              PT Long Term Goals        PT LONG TERM GOAL #1   Title Patient will increase FOTO score to equal to or greater than  50   to demonstrate statistically significant improvement in mobility and quality of life.    Baseline 41 on 01/20/21    Time 8    Period Weeks    Status Achieved    Target Date 03/17/21      PT LONG TERM GOAL #2   Title Pt will improve mini BEST test by 4 points or more in order to indicate clinically significant improvent in balance.    Baseline see flowsheets 27 on 2/27    Time 12    Period Weeks    Status Achieved    Target Date 04/14/21      PT LONG TERM GOAL #3   Title Patient  will complete five times sit to stand test in < 15 seconds indicating an increased LE strength and improved balance.    Baseline 13.9 sec on 2/27    Time 12    Period Weeks    Status Achieved    Target Date 04/14/21      PT LONG TERM GOAL #4   Title Patient will increase 10 meter walk test to >1.64ms as to improve gait speed for better community ambulation and to reduce fall risk.    Baseline .86 m/s on 01/20/21 2/27: 1.27 m/s    Time 8     Period Weeks    Status Achieved    Target Date 03/17/21      PT LONG TERM GOAL #5   Title Pt will improve dual task TUG to less than 20 seconds in order to indicate improved cognitive and motor tasks.    Baseline 28.82 on 01/20/21 14.98 sec on 2/27    Time 12    Period Weeks    Status Achieved    Target Date 04/14/21  Additional Long Term Goals   Additional Long Term Goals Yes      PT LONG TERM GOAL #6   Title Patient will improve right shoulder by 1 increment on manual muscle test grading scale in order to indicate improved right upper extremity strength.    Baseline 3-/5 her right shoulder flexion and abduction(due to range of motion restriction) 3+ with external rotation at 0 degrees abduction; 05/18/2021=Access Code: [email or web address]  URL: [email or web address] ; 7/26: grossly 4+/5 with exception of flexion and IR which are both 4-/5   Time 12    Period Weeks    Status Achieved   Target Date 08/10/21      PT LONG TERM GOAL #7   Title Patient will improve QuickDASH score by 12.5 points in order to indicate significantly improved subjective rating of right shoulder function    Baseline 50% on 03/23/2021; 05/18/2021= 23%    Time 8    Period Weeks    Status Achieved    Target Date 05/18/21      PT LONG TERM GOAL #8   Title Patient will improve right shoulder active range of motion by 30 degrees with elevation and by 15 degrees with external rotation in order to indicate improved shoulder function for overhead activities and for self-care activities    Baseline 90 degrees flexion range of motion , 85 degrees abduction range of motion, 25 degrees external rotation range of motion ( all AROM); 05/18/2021=R Shoulder AROM: Flex= 158 deg; ABD= 152; ER= 44    Time 8    Period Weeks    Status Achieved    Target Date 05/18/21      PT LONG TERM GOAL  #9   TITLE Patient will improve right shoulder passive range of motion by 30 degrees of elevation by 10 degrees with external rotation in  order to indicate improved right shoulder range of motion for progressive functional activities    Baseline 120 degrees flexion, 123 degrees abduction, 50 degrees external rotation at 45 degrees abduction on 03/23/2021; 05/18/2021=R Shoulder PROM: Flex= 164 deg; ABD= 160; ER= 52; 7/26: Flex 150 deg, ABD 110 deg *pain limited when tested, ER 49 deg.; 8/9: 160 deg flex, 115 abduction, ER 60 deg.   Time 5   Period Weeks    Status Partially Met    Target Date 09/16/2021      PT LONG TERM GOAL  #10   TITLE Patient will improve right shoulder active range of motion by 15 degrees with elevation and by 10 degrees with external rotation in order to indicate improved shoulder function for overhead activities including reaching for 2nd and 3rd shelf in kitchen cabinet and for self-care activities    Baseline 05/18/2021=R Shoulder AROM: Flex= 158 deg; ABD= 152; ER= 44 and Patient reports difficulty reaching overhead onto 2nd and 3rd shelf at home; 7/26: 145 deg flex, 143 deg abduction, 40 deg; 8/9: 155 Flex, 135 deg abduction, 45 deg ER   Time 5    Period Weeks    Status On-going    Target Date 09/16/2021              Plan -     Clinical Impression Statement Goals reassessed for progress note. Pt has made gains in both active and passive ROM of R shoulder (pain-free) since previous assessment, indicating improved RUE mobility. However, pt has not yet met these goals. Pt overall tolerated all interventions well without increased pain. The pt will continue to benefit from skilled PT in  order to improve shoulder function and QOL.    Personal Factors and Comorbidities Age;Comorbidity 1;Comorbidity 2    Comorbidities arthritis, anxiety, HTN, hyperlipedemia.    Examination-Activity Limitations Carry;Lift;Locomotion Level    Examination-Participation Restrictions Cleaning;Yard Work;Meal Prep    Stability/Clinical Decision Making Stable/Uncomplicated    Rehab Potential Good    PT Frequency 1x / week    PT  Duration 5 weeks    PT Treatment/Interventions ADLs/Self Care Home Management;Gait training;Therapeutic activities;Functional mobility training;Therapeutic exercise;Balance training;Neuromuscular re-education;Manual techniques;Energy conservation;Passive range of motion;Cryotherapy;Electrical Stimulation;Moist Heat;Patient/family education;Dry needling;Joint Manipulations    PT Next Visit Plan shoulder strenghtening & ROM exercises, continue POC   PT Home Exercise Plan 05/18/2021: Access Code: [email or web address]  URL: [email or web address]  Access Code: [email or web address]  URL: [email or web address]; no updates    Consulted and Agree with Plan of Care Patient              Zollie Pee, PT 08/26/2021, 8:02 AM

## 2021-08-31 ENCOUNTER — Encounter: Payer: Self-pay | Admitting: Occupational Therapy

## 2021-08-31 ENCOUNTER — Ambulatory Visit: Payer: No Typology Code available for payment source | Admitting: Occupational Therapy

## 2021-08-31 ENCOUNTER — Ambulatory Visit: Payer: No Typology Code available for payment source | Admitting: Physical Therapy

## 2021-08-31 ENCOUNTER — Ambulatory Visit: Payer: No Typology Code available for payment source | Admitting: Speech Pathology

## 2021-08-31 DIAGNOSIS — M6281 Muscle weakness (generalized): Secondary | ICD-10-CM | POA: Diagnosis not present

## 2021-08-31 NOTE — Therapy (Addendum)
Occupational Therapy Treatment Note   Patient Name: Kim Gomez MRN: [id number] DOB:1955-04-29, 66 y.o., female Today's Date: 08/31/2021  PCP: Gladstone Lighter, MD REFERRING PROVIDER: Jennings Books, MD   OT End of Session - 08/31/21 0924        Visit                                                                             7      Number of visits                                                          37  Authorization Time Period Progress report period starting 08/17/2021    OT Start Time 0915    OT Stop Time 1000    OT Time Calculation (min) 45 min    Activity Tolerance Patient tolerated treatment well    Behavior During Therapy [facility] for tasks assessed/performed             Past Medical History:  Diagnosis Date   Anxiety    Arthritis    legs, hands   Cellulitis of right knee    started on antibiotics 06/21/15.     Depression    Family history of adverse reaction to anesthesia    sister and mom -PONV   Hypercholesteremia    Hypertension    Past Surgical History:  Procedure Laterality Date   ABDOMINAL HYSTERECTOMY     COLONOSCOPY WITH PROPOFOL N/A 06/27/2015   Procedure: COLONOSCOPY WITH PROPOFOL;  Surgeon: Lucilla Lame, MD;  Location: [place];  Service: Endoscopy;  Laterality: N/A;   POLYPECTOMY  06/27/2015   Procedure: POLYPECTOMY;  Surgeon: Lucilla Lame, MD;  Location: [place];  Service: Endoscopy;;   TOTAL HIP ARTHROPLASTY Right 09/10/2019   Procedure: TOTAL HIP ARTHROPLASTY ANTERIOR APPROACH;  Surgeon: Lovell Sheehan, MD;  Location: [facility];  Service: Orthopedics;  Laterality: Right;   Patient Active Problem List   Diagnosis Date Noted   Acute stroke due to ischemia ([place]) 08/10/2020   History of total hip replacement, right 09/10/2019   Special screening for malignant neoplasms, colon    Benign neoplasm of ascending colon    Benign neoplasm of descending colon      REFERRING DIAG: CVA  THERAPY DIAG:  Muscle weakness  (generalized)  Rationale for Evaluation and Treatment Rehabilitation  PERTINENT HISTORY: Pt. is a 66 y.o. female with PMH significant for HTN, HLD, anxiety disorder, recently hospitalized for COVID-19 infection (08/06/2020) at an outside facility. Patient was brought to the ED on 7/24 after she had a sudden onset of numbness involving right side of her body without any motor deficits, dysarthria, dysphagia. 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 Per chart for her f/u neuro visit:Left ischemic thalamic/internal capsule infarct presenting with right-sided numbness, weakness - now with residual cognitive fog (improving), imbalance (improving), fatigue, right first + second digit numbness, in patient with known vascular risk factors of hypertension, dyslipidemia, lack of  physical activity, snoring  PRECAUTIONS: None  SUBJECTIVE:  Pt. reports having had a good weekend.  PAIN:  Are you having pain? Yes: 1/10, Pain location: right shoulder. Aggravating factors: Movement initially. Relieving factors: stretching.      OBJECTIVE:   OT TREATMENT     Pt. worked on [facility] skills grasping 1" sticks, 1/4" collars, and 1/4" washers. Pt. worked on storing the objects in the palm, and translatory skills moving the items from the palm of the hand to the tip of the 2nd digit, and thumb. Pt. worked on removing the pegs using bilateral alternating hand patterns. Pt. Worked on functional reaching RUE using resistive clips.   Pt. reports less pain today at 1/10 while working with her RUE in elevation. Pt. was able to perform [facility] skills, and translatory movements with the right hand.  Pt. required increased time to complete the task with the smaller objects. Pt. dropped multiple 1/4" flat washers.  Pt. Required cues for hand position when grasping the 1/4" collars from the 1" sticks. Pt. has numbness in the tips of her thumb, and 2nd through 5th digits. Pt.  Reports that she improved with pushing her shopping cart this weekend. However presents with Right shoulder pain with reaching tasks.  Pt. continues to worked on improving RUE strength to improve ADL, and IADL functioning including: using RUE for functional reaching while retrieving weighted items from higher higher shelves, and pushing shopping carts.  Measurements   Right   Shoulder flexion: 145 Shoulder Abduction: 125 Wrist extension 45  Strength:  Shoulder flexion: 4/5 Abduction 4/5 Elbow flexion/extension 5/5  Grip strength: 42# Pinch strength: 16# 3Pt.: 12#  [place], 9-hole peg test: 25 sec.   PATIENT EDUCATION: Education details: RUE functioning Person educated: Patient Education method: Explanation, Demonstration, and Verbal cues Education comprehension: verbalized understanding, returned demonstration, tactile cues required, and needs further education   HOME EXERCISE PROGRAM Continue ongoing HEPs for the RUE     OT Long Term Goals - 07/08/21 1024       OT LONG TERM GOAL #1   Title Patient will be independent with home exercise program    Baseline 08/17/2021: Independent 03/23/2021: Pt. is independent, 4/5: will continue to upgrade exercises5/03/2021: Independent 07/08/2021: Independent with HEPs    Time 12    Period Weeks    Status On-going    Target Date 09/30/21      OT LONG TERM GOAL #4   Title Patient will increase right grip strength by 10 pounds to be able to open jars and containers with modified independence    Baseline 28 pounds at eval and difficulty with managing jars and containers. 10th visit: R: 34# Jars, and containers continue to be difficulty to open.  4/5: right grip 50# 05/20/2021: right grip strength 40# 07/08/2021: R: 49# 08/17/2021: 42#   Time 12    Period Weeks    Status Partially Met    Target Date 09/30/21      OT LONG TERM GOAL #6   Title Patient will decrease right shoulder pain to 2 out of 10 or less with movement patterns for daily  activities.    Baseline Increased pain in right shoulder, 5 out of 10 with movement. 10th visit: 8/10 right shoulder pain, 4/5:  2/10 with movement today but has increased pain other days and not yet consistent.5/03: 0/10 during movement today. 07/08/2021: 0/10 pain in the right shoulder 08/17/2021: 1/10   Time 12    Period Weeks    Status  Partially Met    Target Date 09/30/21      OT LONG TERM GOAL #8   Title Will assess for potential return to work tasks as patient progresses with therapy    Baseline Patient currently unable to perform her job as outlined in her job description    Time 12    Period Weeks    Status Deferred      OT [place]  #9   TITLE Pt will improve strength to be able to reach up to place items into closets, and cabinets independently    Baseline 08/17/2021: Pt. Is a able to reach up, however is not efficient, takes increased time, and is unable to grasp weighted objects from shelves. 07/08/2021:pt. has difficulty reaching up to place items in closets, and cabinets. 05/20/2021: Pt. continues to have difficulty 4/5: unable    Time 12    Period Weeks    Status Revised    Target Date 09/30/21      OT LONG TERM GOAL  #10   TITLE When on the passenger side, pt will be able to close door with modified independence    Baseline 07/08/2021: Met 4/5:  difficulty at times    Time 6    Period Weeks    Status Achieved      OT LONG TERM GOAL  #11   TITLE Pt will demonstrate ability to vacuum carpet of 2 rooms with use of right UE and no rest breaks    Baseline 08/17/2021: Independent, and efficient. 07/08/2021: Independent without rest breaks. 4/5:  difficulty with using vacuum. 5/03: Pt. has progressed and is now able to vacuum multiple rooms in her home    Time 6    Period Weeks    Status Achieved   Target Date 09/30/21      OT LONG TERM GOAL  #13   TITLE Pt will improve strength to be able to push a full grocery cart   Baseline 08/17/2021: Pt. Has difficulty pushing a  full size grocery cart. 07/08/2021: Pt. continues to have difficulty picking up a bag of fertilizer. 4/5: unable    Time 12    Period Weeks    Status revised   Target Date 07/15/21                Plan - 07/27/21 1258     Clinical Impression Statement Pt. reports less pain today at 1/10 while working with her RUE in elevation. Pt. was able to perform [facility] skills, and translatory movements with the right hand.  Pt. required increased time to complete the task with the smaller objects. Pt. dropped multiple 1/4" flat washers.  Pt. Required cues for hand position when grasping the 1/4" collars from the 1" sticks. Pt. has numbness in the tips of her thumb, and 2nd through 5th digits. Pt. Reports that she improved with pushing her shopping cart this weekend. However presents with Right shoulder pain with reaching tasks.  Pt. continues to worked on improving RUE strength to improve ADL, and IADL functioning including: using RUE for functional reaching while retrieving weighted items from higher higher shelves, and pushing shopping carts.                 OT Occupational Profile and History Problem Focused Assessment - Including review of records relating to presenting problem    Body Structure / Function / Physical Skills ADL;Dexterity;ROM;Strength;Coordination;FMC;IADL;Pain;Sensation;UE functional use;Decreased knowledge of use of DME    Cognitive Skills Memory;Safety Awareness  Psychosocial Skills Environmental  Adaptations;Habits;Routines and Behaviors    Rehab Potential Good    Clinical Decision Making Several treatment options, min-mod task modification necessary    Comorbidities Affecting Occupational Performance: May have comorbidities impacting occupational performance    Modification or Assistance to Complete Evaluation  No modification of tasks or assist necessary to complete eval    OT Frequency 2x / week    OT Duration 12 weeks    OT Treatment/Interventions Self-care/ADL  training;Cryotherapy;Paraffin;Therapeutic exercise;DME and/or AE instruction;Cognitive remediation/compensation;Neuromuscular education;Manual Therapy;Moist Heat;Contrast Bath;Therapeutic activities;Patient/family education    Consulted and Agree with Plan of Care Patient              Harrel Carina, MS, OTR/L   Harrel Carina, OT 08/31/2021, 9:30 AM

## 2021-09-02 ENCOUNTER — Ambulatory Visit: Payer: No Typology Code available for payment source | Admitting: Speech Pathology

## 2021-09-02 ENCOUNTER — Ambulatory Visit: Payer: No Typology Code available for payment source | Admitting: Occupational Therapy

## 2021-09-02 ENCOUNTER — Ambulatory Visit: Payer: No Typology Code available for payment source

## 2021-09-02 DIAGNOSIS — R278 Other lack of coordination: Secondary | ICD-10-CM

## 2021-09-02 DIAGNOSIS — M6281 Muscle weakness (generalized): Secondary | ICD-10-CM

## 2021-09-02 DIAGNOSIS — R4701 Aphasia: Secondary | ICD-10-CM

## 2021-09-02 DIAGNOSIS — M25611 Stiffness of right shoulder, not elsewhere classified: Secondary | ICD-10-CM

## 2021-09-02 DIAGNOSIS — R41841 Cognitive communication deficit: Secondary | ICD-10-CM

## 2021-09-02 DIAGNOSIS — G8929 Other chronic pain: Secondary | ICD-10-CM

## 2021-09-02 NOTE — Therapy (Signed)
OUTPATIENT SPEECH LANGUAGE PATHOLOGY TREATMENT NOTE AND DISCHARGE SUMMARY   Patient Name: Kim Gomez MRN: [id number] DOB:[birth year], 66 y.o., female Today's Date: 09/02/2021  PCP: Gladstone Lighter, MD REFERRING PROVIDER: Jennings Books, MD   SPEECH THERAPY DISCHARGE SUMMARY  Visits from Start of Care: 51  Current functional level related to goals / functional outcomes: Patient met 3/3 active LTGs since last progress note; she met total of 4/4 STGs and 5/5 LTGs during course of therapy. She is demonstrating excellent carryover of compensations for attention, executive function, and recall.    Remaining deficits: Mild cognitive communication impairments for which she is compensating very well at this time using external aids (planner, notebook).   Education / Equipment: Ongoing activities to support language and cognition at home, continued use of external aids and compensations   Patient agrees to discharge. Patient goals were met. Patient is being discharged due to meeting the stated rehab goals..     End of Session - 09/02/21 0925     Visit Number 34    Number of Visits 37    Date for SLP Re-Evaluation 09/02/21    Progress Note Due on Visit 10    SLP Start Time 0850    SLP Stop Time  0945    SLP Time Calculation (min) 55 min    Activity Tolerance Patient tolerated treatment well              Past Medical History:  Diagnosis Date   Anxiety    Arthritis    legs, hands   Cellulitis of right knee    started on antibiotics 06/21/15.     Depression    Family history of adverse reaction to anesthesia    sister and mom -PONV   Hypercholesteremia    Hypertension    Past Surgical History:  Procedure Laterality Date   ABDOMINAL HYSTERECTOMY     COLONOSCOPY WITH PROPOFOL N/A 06/27/2015   Procedure: COLONOSCOPY WITH PROPOFOL;  Surgeon: Lucilla Lame, MD;  Location: [place];  Service: Endoscopy;  Laterality: N/A;   POLYPECTOMY  06/27/2015   Procedure:  POLYPECTOMY;  Surgeon: Lucilla Lame, MD;  Location: [place];  Service: Endoscopy;;   TOTAL HIP ARTHROPLASTY Right 09/10/2019   Procedure: TOTAL HIP ARTHROPLASTY ANTERIOR APPROACH;  Surgeon: Lovell Sheehan, MD;  Location: [facility];  Service: Orthopedics;  Laterality: Right;   Patient Active Problem List   Diagnosis Date Noted   Acute stroke due to ischemia ([place]) 08/10/2020   History of total hip replacement, 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 No     OBJECTIVE:   TODAY'S TREATMENT: Pt used alternating attention and processing strategies while filling out questionnaires re: cognition and alternated with conversation with SLP with modified independence. Pt brought planner and demonstrated continued independent use to manage her daily activities, IADLs. Administered SLUMS for assessment of cognition; pt scored 23/30 (26 and above is WNL) consistent with mild cognitive communication impairment. Notably, during testing pt exhibited awareness of deficits and started to initiate use of compensations (writing things down), however this was not permitted per assessment guidelines.   The "[place]  Status" (SLUMS) Examination was administered. Pt scored **/30, raising concern for the presence of a neurocognitive disorder. Further testing would be beneficial, as deficits of attention, memory, oriantion, problem solving, and executive functions identified today may negatively impact pt safety with independent living.   SLUMS Examination Orientation  3/3  Numeric Problem Solving  1/3  Memory  3/5  Attention 1/2  Thought  Organization 3/3  Clock Drawing 4/4  Visuospatial Skills               2/2  Short Story Recall  6/8  Total  23/30     Scoring  High School Education  Less than High School Education   Normal  27-30 25-30  Mild Neurocognitive Disorder 21-26 20-24  Dementia  1-20 1-19      The Neuro-QOLT Item Bank v2.0-Cognition Function-Short Form is an eight-item test designed to measure difficulties with cognitive functioning (e.g., memory, attention and decision making or in the application of such abilities to everyday tasks (e.g., planning, organizing, calculating, remembering and learning).  T-SCORE: 43.9; within <1 SD of mean of 50   PATIENT EDUCATION: Education details: cognitive activities for home Person educated: Patient Education method: Explanation, Demonstration, and Verbal cues Education comprehension: verbalized understanding, returned demonstration, and needs further education   SLP Short Term Goals - 07/30/21 0843       SLP SHORT TERM GOAL #1   Title Pt will verbalize and demonstrate how to implement 2 memory and attention strategies to aid daily functioning given occasional min A over 2 sessions    Status Achieved      SLP SHORT TERM GOAL #2   Title Pt will manage finances with use of compensations with no reported episodes of missed bills given rare min A    Status Achieved      SLP SHORT TERM GOAL #3   Title Pt will identify errors in writing and self-correct with 80% accuracy given rare min A over 2 sessions    Status Achieved      SLP SHORT TERM GOAL #4   Title Pt will participate in further language assessment as needed (goals to be added)    Status Achieved    LTGs = STGs          SLP Long Term Goals - 09/02/21 0920       SLP LONG TERM GOAL #1   Title Pt will verbalize and implement 4 attention and memory strategies to aid daily functioning for ADLs and IADLs with rare min A over 2 sessions    Baseline 07/29/21: occasional min-mod cues    Time 12     Period Weeks    Status On-going      SLP LONG TERM GOAL #2   Title Pt will identify and correct paraphasias in conversation and writing with 90% accuracy given rare min A over 2 sessions    Time 12    Period Weeks    Status Achieved      SLP LONG TERM GOAL #3   Title Pt will use compensations for thought organization effectively in 20 minutes complex conversation.    Baseline mod complex conv achieved 5/3, goal revised 07/29/21: Having weekly planned conversations about feelings with spouse, cues for consistency necessary for Bible study planning    Time 12    Period Weeks    Status Achieved      SLP LONG TERM GOAL #4   Title Patient will maintain alternating attention between 2 mod complex tasks for  15 minutes with modified independence (strategies allowed).    Baseline 07/29/21 min-mod cues necessary to use strategies    Time 12    Period Weeks    Status Achieved      SLP LONG TERM GOAL #5   Title Patient will use strategies and executive function aid to preplan and manage IADLs independently.    Baseline 07/29/21: using planner to manage appointments; requires min-mod cues to preplan for meals    Time 12    Period Weeks    Status Achieved                Plan - 07/15/21 1344     Clinical Impression Statement Patient continues with mild cognitive communication impairment, however she is independent with using compensations for communication, attention, recall and executive function. Pt consistently using preplanning for weekly conversations with spouse and Bible study. She has been independent maintaining her executive function aid over the last 3 sessions.  Demonstrates carryover of strategies for alternating attention functionally in session, and is able to recognize opportunities to use strategies and initiate use independently. Pt took notes on suggestions for cognitive activities and apps she can use at home for ongoing cognitive stimulation. She reports she is pleased with  current functional level and is in agreement with planned d/c today.    Speech Therapy Frequency D/c   Duration D/c    Treatment/Interventions Language facilitation;Environmental controls;SLP instruction and feedback;Cognitive reorganization;Compensatory strategies;Patient/family education;Internal/external aids    Potential to Achieve Goals Good    SLP Home Exercise Plan see pt instructions    Consulted and Agree with Plan of Care Patient           Deneise Lever, MS, CCC-SLP Speech-Language Pathologist ([place], [place] 09/02/2021, 9:30 AM

## 2021-09-02 NOTE — Addendum Note (Signed)
Addended by: Aliene Altes on: 09/02/2021 09:58 AM   Modules accepted: Orders

## 2021-09-02 NOTE — Therapy (Signed)
OUTPATIENT PHYSICAL THERAPY TREATMENT NOTE      Patient Name: Kim Gomez MRN: [id number] DOB:Jan 10, 1956, 66 y.o., female Today's Date: 09/02/2021  PCP: Gladstone Lighter, MD REFERRING PROVIDER: Vladimir Crofts, MD   PT End of Session - 09/02/21 0855     Visit Number 31    Number of Visits 33    Date for PT Re-Evaluation 09/16/21    Authorization Time Period Recert 05/19/7780-05/11/5359    Progress Note Due on Visit 30    PT Start Time 0805    PT Stop Time 0846    PT Time Calculation (min) 41 min    Equipment Utilized During Treatment Gait belt    Activity Tolerance Patient tolerated treatment well;No increased pain    Behavior During Therapy WFL for tasks assessed/performed                  Past Medical History:  Diagnosis Date   Anxiety    Arthritis    legs, hands   Cellulitis of right knee    started on antibiotics 06/21/15.     Depression    Family history of adverse reaction to anesthesia    sister and mom -PONV   Hypercholesteremia    Hypertension    Past Surgical History:  Procedure Laterality Date   ABDOMINAL HYSTERECTOMY     COLONOSCOPY WITH PROPOFOL N/A 06/27/2015   Procedure: COLONOSCOPY WITH PROPOFOL;  Surgeon: Lucilla Lame, MD;  Location: [place];  Service: Endoscopy;  Laterality: N/A;   POLYPECTOMY  06/27/2015   Procedure: POLYPECTOMY;  Surgeon: Lucilla Lame, MD;  Location: [place];  Service: Endoscopy;;   TOTAL HIP ARTHROPLASTY Right 09/10/2019   Procedure: TOTAL HIP ARTHROPLASTY ANTERIOR APPROACH;  Surgeon: Lovell Sheehan, MD;  Location: [facility];  Service: Orthopedics;  Laterality: Right;   Patient Active Problem List   Diagnosis Date Noted   Acute stroke due to ischemia ([place]) 08/10/2020   History of total hip replacement, right 09/10/2019   Special screening for malignant neoplasms, colon    Benign neoplasm of ascending colon    Benign neoplasm of descending colon     REFERRING DIAG: I63.9 (ICD-10-CM) - Stroke  ([place])  THERAPY DIAG:  Muscle weakness (generalized)  Chronic right shoulder pain  Stiffness of right shoulder, not elsewhere classified  Rationale for Evaluation and Treatment Rehabilitation  PERTINENT HISTORY: 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 Pt reports a fall on October 29 th. Pt reports she hurt the R UE with the fall and she reports some stiffness in the knee as well as general weakness on the right side. Pt reports she has had 2 strokes but the first went under the radar and was only evidenced by imaging presented to her by her MD. Pt reports she has been modifying her activities in order to be careful not to fall since the stroke. Reports she has been taking her time with a lot of things. Pt reports she has a membership at [employer] and she has been walking on the treadmill 2-3 times per week, she reports she walks on the treadmill for an hour at a time following working her way up. Pt reports ocassional numbness and tingling in her hands.   PRECAUTIONS: none  SUBJECTIVE: Pt reports no pain at the moment, but was having a hard time reaching over head on Monday. Overall reports shoulder doing well. PAIN:  Are you having pain?  Yes: NPRS scale: 0/10 Pain location: Right anterior shoulder  Pain description: "stiff" Aggravating factors: prolonged positioning  Relieving factors: PT exercises      TODAY'S TREATMENT:  09/02/21   INTERVENTIONS  09/02/2021: Therex: Heat donned to R shoulder while pt performs the following -  On plinth, supine: RUE: AROM supine flexion, abduction 10x  for each within pain-free range  Abduction and flexion stretch 2x30-45  sec for each   2# serratus punch in supine 12x2 sets. Pt reports no pain with intervention, reports feels good  Serratus punch with perturbations: 1x60 sec. Rates medium  5# weighted bar shoulder flex B 10x with 2-5 sec holds at  end range. Pt rates easy  Side-lying R shoulder abduction 10x  Progressed to use of 1# 10x   Progressed to use of 2# 5x. Rates "a little challenging"  Side-lying R shoulder ER 12x. Pt rates medium.  Seated YTB ER/pull-aparts 12x 3 sec holds. Rates as easier than side-lying AROM variation Shoulder shrugs 12x 3 sec holds Shoulder circles CW/CC 5x each. Pt reports "feels good"  Skin WNL prior to and upon removal of heat. Pt continues to report heat feels good to shoulder.  Added side-lying ER to HEP for area of focus  PATIENT EDUCATION Education details:Exercise technique, body mechanics Person educated: Patient Education method: Explanation, Demonstration, Tactile cues, and Verbal cues Education comprehension: verbalized understanding, returned demonstration, and verbal cues required   HOME EXERCISE PROGRAM: Updated on this date: Access Code: [email or web address] URL: [email or web address] Date: 09/02/2021 Prepared by: Ricard Dillon  Exercises - Sidelying Shoulder External Rotation  - 1 x daily - 5 x weekly - 3 sets - 10 reps - 2 seconds hold   PT Short Term Goals        PT SHORT TERM GOAL #1   Title Patient will be independent in home exercise program to improve strength/mobility for better functional independence with ADLs.    Baseline no HEP at this time    Time 4    Period Weeks    Status Achieved    Target Date 02/17/21      PT SHORT TERM GOAL #2   Title Patient will be independent in progressive shoulder home exercise program in order to improve her shoulder strength and function    Baseline Patient has initial home exercise program and is comfortable with that but it is yet to be progressed for higher level activities ; 7/26: working toward independence   Time 2   Period Weeks    Status New    Target Date 08/26/2021              PT Long Term Goals        PT LONG TERM GOAL #1   Title Patient will increase FOTO score to equal to or greater than  50   to  demonstrate statistically significant improvement in mobility and quality of life.    Baseline 41 on 01/20/21    Time 8    Period Weeks    Status Achieved    Target Date 03/17/21      PT LONG TERM GOAL #2   Title Pt will improve mini BEST test by 4 points or more in order to indicate clinically significant improvent in balance.    Baseline see flowsheets 27 on 2/27    Time 12    Period Weeks    Status Achieved    Target Date 04/14/21      PT LONG TERM GOAL #3  Title Patient  will complete five times sit to stand test in < 15 seconds indicating an increased LE strength and improved balance.    Baseline 13.9 sec on 2/27    Time 12    Period Weeks    Status Achieved    Target Date 04/14/21      PT LONG TERM GOAL #4   Title Patient will increase 10 meter walk test to >1.62ms as to improve gait speed for better community ambulation and to reduce fall risk.    Baseline .86 m/s on 01/20/21 2/27: 1.27 m/s    Time 8    Period Weeks    Status Achieved    Target Date 03/17/21      PT LONG TERM GOAL #5   Title Pt will improve dual task TUG to less than 20 seconds in order to indicate improved cognitive and motor tasks.    Baseline 28.82 on 01/20/21 14.98 sec on 2/27    Time 12    Period Weeks    Status Achieved    Target Date 04/14/21      Additional Long Term Goals   Additional Long Term Goals Yes      PT LONG TERM GOAL #6   Title Patient will improve right shoulder by 1 increment on manual muscle test grading scale in order to indicate improved right upper extremity strength.    Baseline 3-/5 her right shoulder flexion and abduction(due to range of motion restriction) 3+ with external rotation at 0 degrees abduction; 05/18/2021=Access Code: [email or web address] URL: [email or web address] ; 7/26: grossly 4+/5 with exception of flexion and IR which are both 4-/5   Time 12    Period Weeks    Status Achieved   Target Date 08/10/21      PT LONG TERM GOAL #7   Title Patient will  improve QuickDASH score by 12.5 points in order to indicate significantly improved subjective rating of right shoulder function    Baseline 50% on 03/23/2021; 05/18/2021= 23%    Time 8    Period Weeks    Status Achieved    Target Date 05/18/21      PT LONG TERM GOAL #8   Title Patient will improve right shoulder active range of motion by 30 degrees with elevation and by 15 degrees with external rotation in order to indicate improved shoulder function for overhead activities and for self-care activities    Baseline 90 degrees flexion range of motion , 85 degrees abduction range of motion, 25 degrees external rotation range of motion ( all AROM); 05/18/2021=R Shoulder AROM: Flex= 158 deg; ABD= 152; ER= 44    Time 8    Period Weeks    Status Achieved    Target Date 05/18/21      PT LONG TERM GOAL  #9   TITLE Patient will improve right shoulder passive range of motion by 30 degrees of elevation by 10 degrees with external rotation in order to indicate improved right shoulder range of motion for progressive functional activities    Baseline 120 degrees flexion, 123 degrees abduction, 50 degrees external rotation at 45 degrees abduction on 03/23/2021; 05/18/2021=R Shoulder PROM: Flex= 164 deg; ABD= 160; ER= 52; 7/26: Flex 150 deg, ABD 110 deg *pain limited when tested, ER 49 deg.; 8/9: 160 deg flex, 115 abduction, ER 60 deg.   Time 5   Period Weeks    Status Partially Met    Target Date 09/16/2021      PT LONG TERM GOAL  #  10   TITLE Patient will improve right shoulder active range of motion by 15 degrees with elevation and by 10 degrees with external rotation in order to indicate improved shoulder function for overhead activities including reaching for 2nd and 3rd shelf in kitchen cabinet and for self-care activities    Baseline 05/18/2021=R Shoulder AROM: Flex= 158 deg; ABD= 152; ER= 44 and Patient reports difficulty reaching overhead onto 2nd and 3rd shelf at home; 7/26: 145 deg flex, 143 deg abduction, 40  deg; 8/9: 155 Flex, 135 deg abduction, 45 deg ER   Time 5    Period Weeks    Status On-going    Target Date 09/16/2021              Plan -     Clinical Impression Statement Pt continues to progress nicely with performing interventions with increased reps and/or weight or with a more challenging variation. Majority of interventions rated easy-medium with most feeling good to shoulder. Most challenged with shoulder ER in sidelying, which was added to HEP. The pt will continue to benefit from skilled PT in order to improve shoulder function and QOL.    Personal Factors and Comorbidities Age;Comorbidity 1;Comorbidity 2    Comorbidities arthritis, anxiety, HTN, hyperlipedemia.    Examination-Activity Limitations Carry;Lift;Locomotion Level    Examination-Participation Restrictions Cleaning;Yard Work;Meal Prep    Stability/Clinical Decision Making Stable/Uncomplicated    Rehab Potential Good    PT Frequency 1x / week    PT Duration 5 weeks    PT Treatment/Interventions ADLs/Self Care Home Management;Gait training;Therapeutic activities;Functional mobility training;Therapeutic exercise;Balance training;Neuromuscular re-education;Manual techniques;Energy conservation;Passive range of motion;Cryotherapy;Electrical Stimulation;Moist Heat;Patient/family education;Dry needling;Joint Manipulations    PT Next Visit Plan shoulder strenghtening & ROM exercises, continue POC   PT Home Exercise Plan 05/18/2021: Access Code: [email or web address]  URL: [email or web address]  Access Code: [email or web address]  URL: [email or web address]; no updates    Consulted and Agree with Plan of Care Patient              Zollie Pee, PT 09/02/2021, 8:56 AM

## 2021-09-02 NOTE — Therapy (Addendum)
Occupational Therapy Treatment Note   Patient Name: Kim Gomez MRN: [id number] DOB:30-Jun-1955, 66 y.o., female 78 Date: 09/02/2021  PCP: Gladstone Lighter, MD REFERRING PROVIDER: Jennings Books, MD    OT End of Session - 09/02/21 1050     Visit Number 45    Number of Visits 32    Date for OT Re-Evaluation 09/30/21    Authorization Time Period Progress report period starting 08/17/2021    OT Start Time 1005    OT Stop Time 1045    OT Time Calculation (min) 40 min    Activity Tolerance Patient tolerated treatment well    Behavior During Therapy [facility] for tasks assessed/performed                       Past Medical History:  Diagnosis Date   Anxiety    Arthritis    legs, hands   Cellulitis of right knee    started on antibiotics 06/21/15.     Depression    Family history of adverse reaction to anesthesia    sister and mom -PONV   Hypercholesteremia    Hypertension    Past Surgical History:  Procedure Laterality Date   ABDOMINAL HYSTERECTOMY     COLONOSCOPY WITH PROPOFOL N/A 06/27/2015   Procedure: COLONOSCOPY WITH PROPOFOL;  Surgeon: Lucilla Lame, MD;  Location: [place];  Service: Endoscopy;  Laterality: N/A;   POLYPECTOMY  06/27/2015   Procedure: POLYPECTOMY;  Surgeon: Lucilla Lame, MD;  Location: [place];  Service: Endoscopy;;   TOTAL HIP ARTHROPLASTY Right 09/10/2019   Procedure: TOTAL HIP ARTHROPLASTY ANTERIOR APPROACH;  Surgeon: Lovell Sheehan, MD;  Location: [facility];  Service: Orthopedics;  Laterality: Right;   Patient Active Problem List   Diagnosis Date Noted   Acute stroke due to ischemia ([place]) 08/10/2020   History of total hip replacement, right 09/10/2019   Special screening for malignant neoplasms, colon    Benign neoplasm of ascending colon    Benign neoplasm of descending colon      REFERRING DIAG: CVA  THERAPY DIAG:  Muscle weakness (generalized)  Other lack of coordination  Rationale for Evaluation and  Treatment Rehabilitation  PERTINENT HISTORY: Pt. is a 66 y.o. female with PMH significant for HTN, HLD, anxiety disorder, recently hospitalized for COVID-19 infection (08/06/2020) at an outside facility. Patient was brought to the ED on 7/24 after she had a sudden onset of numbness involving right side of her body without any motor deficits, dysarthria, dysphagia. 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 Per chart for her f/u neuro visit:Left ischemic thalamic/internal capsule infarct presenting with right-sided numbness, weakness - now with residual cognitive fog (improving), imbalance (improving), fatigue, right first + second digit numbness, in patient with known vascular risk factors of hypertension, dyslipidemia, lack of physical activity, snoring  PRECAUTIONS: None  SUBJECTIVE:  Pt. reports having had a good weekend.  PAIN:  Are you having pain? Yes: 1-3/10, Pain location: right shoulder. Aggravating factors: reaching her right arm out away from her body. Relieving factors: stretching.      OBJECTIVE:   OT Treatment    Pt. worked on BUE strengthening, and reciprocal motion using the UBE while seated for 8 min. with no resistance. Constant monitoring was provided. Pt. worked on functional RUE reaching with [facility] skills reaching up to place 1&1/2" pegs onto an elevated vertical [employer].  Pt. worked on translatory movements, moving the pegs from the  tip of her fingers, to her palm, through her hand, and back out to the tip of her 2nd digit, 3rd digit, and thumb. Pt. worked on sustained RUE reach while flipping the pegs. Pt. worked on reaching up into right shoulder flexion, and abduction to place to pegs in the container after performing translatory movements.      Pt. reports that her arm feels better today overall. Pt. presented with improved isolated right shoulder movements today, however does require cues to avoid  compensation proximally with hiking of the shoulder. Pt. was able to grasp, store, and perform translatory movements with 3, and 4 pegs. Pt. Tolerated the reaching tasks well initially, however fatigued as the task progressed while manipulating the top rows of pegs, and when challenged with adding speed to the coordination task. Pt. Reported numbness in the right 2nd, 3rd digits, and thumb.  Pt. continues to work on improving RUE strength, and right functional hand use to improve reaching during ADLs, and IADL tasks.           Measurements   Right   Shoulder flexion: 145 Shoulder Abduction: 125 Wrist extension 45  Strength:  Shoulder flexion: 4/5 Abduction 4/5 Elbow flexion/extension 5/5  Grip strength: 42# Pinch strength: 16# 3Pt.: 12#  [place], 9-hole peg test: 25 sec.   PATIENT EDUCATION: Education details: RUE functioning Person educated: Patient Education method: Explanation, Demonstration, and Verbal cues Education comprehension: verbalized understanding, returned demonstration, tactile cues required, and needs further education   HOME EXERCISE PROGRAM Continue ongoing HEPs for the RUE     OT Long Term Goals - 07/08/21 1024       OT LONG TERM GOAL #1   Title Patient will be independent with home exercise program    Baseline 08/17/2021: Independent 03/23/2021: Pt. is independent, 4/5: will continue to upgrade exercises5/03/2021: Independent 07/08/2021: Independent with HEPs    Time 12    Period Weeks    Status On-going    Target Date 09/30/21      OT LONG TERM GOAL #4   Title Patient will increase right grip strength by 10 pounds to be able to open jars and containers with modified independence    Baseline 28 pounds at eval and difficulty with managing jars and containers. 10th visit: R: 34# Jars, and containers continue to be difficulty to open.  4/5: right grip 50# 05/20/2021: right grip strength 40# 07/08/2021: R: 49# 08/17/2021: 42#   Time 12    Period Weeks     Status Partially Met    Target Date 09/30/21      OT LONG TERM GOAL #6   Title Patient will decrease right shoulder pain to 2 out of 10 or less with movement patterns for daily activities.    Baseline Increased pain in right shoulder, 5 out of 10 with movement. 10th visit: 8/10 right shoulder pain, 4/5:  2/10 with movement today but has increased pain other days and not yet consistent.5/03: 0/10 during movement today. 07/08/2021: 0/10 pain in the right shoulder 08/17/2021: 1/10   Time 12    Period Weeks    Status Partially Met    Target Date 09/30/21      OT LONG TERM GOAL #8   Title Will assess for potential return to work tasks as patient progresses with therapy    Baseline Patient currently unable to perform her job as outlined in her job description    Time 12    Period Weeks    Status Deferred  OT LONG TERM GOAL  #9   TITLE Pt will improve strength to be able to reach up to place items into closets, and cabinets independently    Baseline 08/17/2021: Pt. Is a able to reach up, however is not efficient, takes increased time, and is unable to grasp weighted objects from shelves. 07/08/2021:pt. has difficulty reaching up to place items in closets, and cabinets. 05/20/2021: Pt. continues to have difficulty 4/5: unable    Time 12    Period Weeks    Status Revised    Target Date 09/30/21      OT LONG TERM GOAL  #10   TITLE When on the passenger side, pt will be able to close door with modified independence    Baseline 07/08/2021: Met 4/5:  difficulty at times    Time 6    Period Weeks    Status Achieved      OT LONG TERM GOAL  #11   TITLE Pt will demonstrate ability to vacuum carpet of 2 rooms with use of right UE and no rest breaks    Baseline 08/17/2021: Independent, and efficient. 07/08/2021: Independent without rest breaks. 4/5:  difficulty with using vacuum. 5/03: Pt. has progressed and is now able to vacuum multiple rooms in her home    Time 6    Period Weeks    Status Achieved    Target Date 09/30/21      OT LONG TERM GOAL  #13   TITLE Pt will improve strength to be able to push a full grocery cart   Baseline 08/17/2021: Pt. Has difficulty pushing a full size grocery cart. 07/08/2021: Pt. continues to have difficulty picking up a bag of fertilizer. 4/5: unable    Time 12    Period Weeks    Status revised   Target Date 07/15/21                Plan - 07/27/21 1258     Clinical Impression Statement Pt. reports that her arm feels better today overall. Pt. presented with improved isolated right shoulder movements today, however does require cues to avoid compensation proximally with hiking of the shoulder. Pt. was able to grasp, store, and perform translatory movements with 3, and 4 pegs. Pt. Tolerated the reaching tasks well initially, however fatigued as the task progressed while manipulating the top rows of pegs, and when challenged with adding speed to the coordination task. Pt. Reported numbness in the right 2nd, 3rd digits, and thumb.  Pt. continues to work on improving RUE strength, and right functional hand use to improve reaching during ADLs, and IADL tasks.                    OT Occupational Profile and History Problem Focused Assessment - Including review of records relating to presenting problem    Body Structure / Function / Physical Skills ADL;Dexterity;ROM;Strength;Coordination;FMC;IADL;Pain;Sensation;UE functional use;Decreased knowledge of use of DME    Cognitive Skills Memory;Safety Awareness    Psychosocial Skills Environmental  Adaptations;Habits;Routines and Behaviors    Rehab Potential Good    Clinical Decision Making Several treatment options, min-mod task modification necessary    Comorbidities Affecting Occupational Performance: May have comorbidities impacting occupational performance    Modification or Assistance to Complete Evaluation  No modification of tasks or assist necessary to complete eval    OT Frequency 2x / week    OT  Duration 12 weeks    OT Treatment/Interventions Self-care/ADL training;Cryotherapy;Paraffin;Therapeutic exercise;DME and/or AE instruction;Cognitive remediation/compensation;Neuromuscular education;Manual  Therapy;Moist Engineer, manufacturing systems;Therapeutic activities;Patient/family education    Consulted and Agree with Plan of Care Patient              Harrel Carina, MS, OTR/L   Harrel Carina, OT 09/02/2021, 12:00 PM

## 2021-09-03 ENCOUNTER — Ambulatory Visit (INDEPENDENT_AMBULATORY_CARE_PROVIDER_SITE_OTHER): Payer: No Typology Code available for payment source

## 2021-09-03 DIAGNOSIS — I639 Cerebral infarction, unspecified: Secondary | ICD-10-CM | POA: Diagnosis not present

## 2021-09-03 NOTE — Progress Notes (Signed)
[place] loop recorder 

## 2021-09-07 ENCOUNTER — Ambulatory Visit: Payer: No Typology Code available for payment source | Admitting: Occupational Therapy

## 2021-09-07 ENCOUNTER — Ambulatory Visit: Payer: No Typology Code available for payment source | Admitting: Physical Therapy

## 2021-09-07 ENCOUNTER — Ambulatory Visit: Payer: No Typology Code available for payment source | Admitting: Speech Pathology

## 2021-09-07 ENCOUNTER — Encounter: Payer: Self-pay | Admitting: Occupational Therapy

## 2021-09-07 DIAGNOSIS — M6281 Muscle weakness (generalized): Secondary | ICD-10-CM

## 2021-09-07 LAB — CUP PACEART REMOTE DEVICE CHECK
Date Time Interrogation Session: 20230821121119
Implantable Pulse Generator Implant Date: 20230405
Pulse Gen Serial Number: 178997

## 2021-09-07 NOTE — Therapy (Addendum)
Occupational Therapy Treatment Note   Patient Name: Kim Gomez MRN: [id number] DOB:[birth year], 66 y.o., female 42 Date: 09/07/2021  PCP: Gladstone Lighter, MD REFERRING PROVIDER: Jennings Books, MD    OT End of Session - 09/07/21 1009     Visit Number 46    Number of Visits 92    Date for OT Re-Evaluation 09/30/21    Authorization Time Period Progress report period starting 08/17/2021    OT Start Time 0915    OT Stop Time 1000    OT Time Calculation (min) 45 min    Activity Tolerance Patient tolerated treatment well    Behavior During Therapy [facility] for tasks assessed/performed                       Past Medical History:  Diagnosis Date   Anxiety    Arthritis    legs, hands   Cellulitis of right knee    started on antibiotics 06/21/15.     Depression    Family history of adverse reaction to anesthesia    sister and mom -PONV   Hypercholesteremia    Hypertension    Past Surgical History:  Procedure Laterality Date   ABDOMINAL HYSTERECTOMY     COLONOSCOPY WITH PROPOFOL N/A 06/27/2015   Procedure: COLONOSCOPY WITH PROPOFOL;  Surgeon: Lucilla Lame, MD;  Location: [place];  Service: Endoscopy;  Laterality: N/A;   POLYPECTOMY  06/27/2015   Procedure: POLYPECTOMY;  Surgeon: Lucilla Lame, MD;  Location: [place];  Service: Endoscopy;;   TOTAL HIP ARTHROPLASTY Right 09/10/2019   Procedure: TOTAL HIP ARTHROPLASTY ANTERIOR APPROACH;  Surgeon: Lovell Sheehan, MD;  Location: [facility];  Service: Orthopedics;  Laterality: Right;   Patient Active Problem List   Diagnosis Date Noted   Acute stroke due to ischemia ([place]) 08/10/2020   History of total hip replacement, right 09/10/2019   Special screening for malignant neoplasms, colon    Benign neoplasm of ascending colon    Benign neoplasm of descending colon      REFERRING DIAG: CVA  THERAPY DIAG:  Muscle weakness (generalized)  Rationale for Evaluation and Treatment  Rehabilitation  PERTINENT HISTORY: Pt. is a 67 y.o. female with PMH significant for HTN, HLD, anxiety disorder, recently hospitalized for COVID-19 infection (08/06/2020) at an outside facility. Patient was brought to the ED on 7/24 after she had a sudden onset of numbness involving right side of her body without any motor deficits, dysarthria, dysphagia. 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 Per chart for her f/u neuro visit:Left ischemic thalamic/internal capsule infarct presenting with right-sided numbness, weakness - now with residual cognitive fog (improving), imbalance (improving), fatigue, right first + second digit numbness, in patient with known vascular risk factors of hypertension, dyslipidemia, lack of physical activity, snoring  PRECAUTIONS: None  SUBJECTIVE:  Pt. reports having had a good weekend.  PAIN:  Are you having pain? Yes: 1/10, Pain location: right shoulder. Aggravating factors: reaching her right arm out away from her body. Relieving factors: stretching.      OBJECTIVE:   OT Treatment    Pt. worked on BUE strengthening, and reciprocal motion using the UBE while seated for 10 min. with minimal resistance. Constant monitoring was provided.  Pt. worked on [employer] right shoulder flexion, using a large incline wedge. Pt. worked on grasping coins from a tabletop surface, placing them into a resistive container, and pushing them through the slot while isolating her  2nd digit. Pt. worked on grasping small pushpins for a container, and reaching up to push them into a resistive cork board placed at a vertical angle.     Pt. presented with improved isolated right shoulder movements today, requiring fewer cues to avoid compensation proximally with hiking of the shoulder. Pt. Is able to tolerate reaching higher, and out further today without pain. Pt. was able to grasp, store, and perform translatory movements with 3, and  4 pegs. Pt. Continues to have numbness in the right 2nd, 3rd digits, and thumb.  Pt. continues to work on improving RUE strength, and right functional hand use to improve reaching during ADLs, and IADL tasks.      Measurements   Right   Shoulder flexion: 145 Shoulder Abduction: 125 Wrist extension 45  Strength:  Shoulder flexion: 4/5 Abduction 4/5 Elbow flexion/extension 5/5  Grip strength: 42# Pinch strength: 16# 3Pt.: 12#  [place], 9-hole peg test: 25 sec.   PATIENT EDUCATION: Education details: RUE functioning Person educated: Patient Education method: Explanation, Demonstration, and Verbal cues Education comprehension: verbalized understanding, returned demonstration, tactile cues required, and needs further education   HOME EXERCISE PROGRAM Continue ongoing HEPs for the RUE     OT Long Term Goals - 07/08/21 1024       OT LONG TERM GOAL #1   Title Patient will be independent with home exercise program    Baseline 08/17/2021: Independent 03/23/2021: Pt. is independent, 4/5: will continue to upgrade exercises5/03/2021: Independent 07/08/2021: Independent with HEPs    Time 12    Period Weeks    Status On-going    Target Date 09/30/21      OT LONG TERM GOAL #4   Title Patient will increase right grip strength by 10 pounds to be able to open jars and containers with modified independence    Baseline 28 pounds at eval and difficulty with managing jars and containers. 10th visit: R: 34# Jars, and containers continue to be difficulty to open.  4/5: right grip 50# 05/20/2021: right grip strength 40# 07/08/2021: R: 49# 08/17/2021: 42#   Time 12    Period Weeks    Status Partially Met    Target Date 09/30/21      OT LONG TERM GOAL #6   Title Patient will decrease right shoulder pain to 2 out of 10 or less with movement patterns for daily activities.    Baseline Increased pain in right shoulder, 5 out of 10 with movement. 10th visit: 8/10 right shoulder pain, 4/5:  2/10 with  movement today but has increased pain other days and not yet consistent.5/03: 0/10 during movement today. 07/08/2021: 0/10 pain in the right shoulder 08/17/2021: 1/10   Time 12    Period Weeks    Status Partially Met    Target Date 09/30/21      OT LONG TERM GOAL #8   Title Will assess for potential return to work tasks as patient progresses with therapy    Baseline Patient currently unable to perform her job as outlined in her job description    Time 12    Period Weeks    Status Deferred      OT [place]  #9   TITLE Pt will improve strength to be able to reach up to place items into closets, and cabinets independently    Baseline 08/17/2021: Pt. Is a able to reach up, however is not efficient, takes increased time, and is unable to grasp weighted objects from shelves. 07/08/2021:pt.  has difficulty reaching up to place items in closets, and cabinets. 05/20/2021: Pt. continues to have difficulty 4/5: unable    Time 12    Period Weeks    Status Revised    Target Date 09/30/21      OT LONG TERM GOAL  #10   TITLE When on the passenger side, pt will be able to close door with modified independence    Baseline 07/08/2021: Met 4/5:  difficulty at times    Time 6    Period Weeks    Status Achieved      OT LONG TERM GOAL  #11   TITLE Pt will demonstrate ability to vacuum carpet of 2 rooms with use of right UE and no rest breaks    Baseline 08/17/2021: Independent, and efficient. 07/08/2021: Independent without rest breaks. 4/5:  difficulty with using vacuum. 5/03: Pt. has progressed and is now able to vacuum multiple rooms in her home    Time 6    Period Weeks    Status Achieved   Target Date 09/30/21      OT LONG TERM GOAL  #13   TITLE Pt will improve strength to be able to push a full grocery cart   Baseline 08/17/2021: Pt. Has difficulty pushing a full size grocery cart. 07/08/2021: Pt. continues to have difficulty picking up a bag of fertilizer. 4/5: unable    Time 12    Period Weeks     Status revised   Target Date 07/15/21                Plan - 07/27/21 1258     Clinical Impression Statement Pt. presented with improved isolated right shoulder movements today, requiring fewer cues to avoid compensation proximally with hiking of the shoulder. Pt. Is able to tolerate reaching higher, and out further today without pain. Pt. was able to grasp, store, and perform translatory movements with 3, and 4 pegs. Pt. Continues to have numbness in the right 2nd, 3rd digits, and thumb.  Pt. continues to work on improving RUE strength, and right functional hand use to improve reaching during ADLs, and IADL tasks.                              OT Occupational Profile and History Problem Focused Assessment - Including review of records relating to presenting problem    Body Structure / Function / Physical Skills ADL;Dexterity;ROM;Strength;Coordination;FMC;IADL;Pain;Sensation;UE functional use;Decreased knowledge of use of DME    Cognitive Skills Memory;Safety Awareness    Psychosocial Skills Environmental  Adaptations;Habits;Routines and Behaviors    Rehab Potential Good    Clinical Decision Making Several treatment options, min-mod task modification necessary    Comorbidities Affecting Occupational Performance: May have comorbidities impacting occupational performance    Modification or Assistance to Complete Evaluation  No modification of tasks or assist necessary to complete eval    OT Frequency 2x / week    OT Duration 12 weeks    OT Treatment/Interventions Self-care/ADL training;Cryotherapy;Paraffin;Therapeutic exercise;DME and/or AE instruction;Cognitive remediation/compensation;Neuromuscular education;Manual Therapy;Moist Heat;Contrast Bath;Therapeutic activities;Patient/family education    Consulted and Agree with Plan of Care Patient              Harrel Carina, MS, OTR/L   Harrel Carina, OT 09/07/2021, 10:15 AM

## 2021-09-09 ENCOUNTER — Ambulatory Visit: Payer: No Typology Code available for payment source

## 2021-09-09 ENCOUNTER — Encounter: Payer: No Typology Code available for payment source | Admitting: Speech Pathology

## 2021-09-09 ENCOUNTER — Ambulatory Visit: Payer: No Typology Code available for payment source | Admitting: Speech Pathology

## 2021-09-09 DIAGNOSIS — M6281 Muscle weakness (generalized): Secondary | ICD-10-CM | POA: Diagnosis not present

## 2021-09-09 DIAGNOSIS — G8929 Other chronic pain: Secondary | ICD-10-CM

## 2021-09-09 DIAGNOSIS — M25611 Stiffness of right shoulder, not elsewhere classified: Secondary | ICD-10-CM

## 2021-09-09 DIAGNOSIS — R278 Other lack of coordination: Secondary | ICD-10-CM

## 2021-09-09 NOTE — Therapy (Signed)
Occupational Therapy Treatment Note   Patient Name: Kim Gomez MRN: [id number] DOB:10-26-1955, 66 y.o., female Today's Date: 09/09/2021  PCP: Gladstone Lighter, MD REFERRING PROVIDER: Jennings Books, MD    OT End of Session - 09/09/21 0850     Visit Number 47    Number of Visits 59    Date for OT Re-Evaluation 09/30/21    Authorization Time Period Progress report period starting 08/17/2021    OT Start Time 0845    OT Stop Time 0930    OT Time Calculation (min) 45 min    Activity Tolerance Patient tolerated treatment well    Behavior During Therapy [facility] for tasks assessed/performed                       Past Medical History:  Diagnosis Date   Anxiety    Arthritis    legs, hands   Cellulitis of right knee    started on antibiotics 06/21/15.     Depression    Family history of adverse reaction to anesthesia    sister and mom -PONV   Hypercholesteremia    Hypertension    Past Surgical History:  Procedure Laterality Date   ABDOMINAL HYSTERECTOMY     COLONOSCOPY WITH PROPOFOL N/A 06/27/2015   Procedure: COLONOSCOPY WITH PROPOFOL;  Surgeon: Lucilla Lame, MD;  Location: [place];  Service: Endoscopy;  Laterality: N/A;   POLYPECTOMY  06/27/2015   Procedure: POLYPECTOMY;  Surgeon: Lucilla Lame, MD;  Location: [place];  Service: Endoscopy;;   TOTAL HIP ARTHROPLASTY Right 09/10/2019   Procedure: TOTAL HIP ARTHROPLASTY ANTERIOR APPROACH;  Surgeon: Lovell Sheehan, MD;  Location: [facility];  Service: Orthopedics;  Laterality: Right;   Patient Active Problem List   Diagnosis Date Noted   Acute stroke due to ischemia ([place]) 08/10/2020   History of total hip replacement, right 09/10/2019   Special screening for malignant neoplasms, colon    Benign neoplasm of ascending colon    Benign neoplasm of descending colon      REFERRING DIAG: CVA  THERAPY DIAG:  Muscle weakness (generalized)  Other lack of coordination  Rationale for Evaluation and  Treatment Rehabilitation  PERTINENT HISTORY: Pt. is a 66 y.o. female with PMH significant for HTN, HLD, anxiety disorder, recently hospitalized for COVID-19 infection (08/06/2020) at an outside facility. Patient was brought to the ED on 7/24 after she had a sudden onset of numbness involving right side of her body without any motor deficits, dysarthria, dysphagia. 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 Per chart for her f/u neuro visit:Left ischemic thalamic/internal capsule infarct presenting with right-sided numbness, weakness - now with residual cognitive fog (improving), imbalance (improving), fatigue, right first + second digit numbness, in patient with known vascular risk factors of hypertension, dyslipidemia, lack of physical activity, snoring  PRECAUTIONS: None  SUBJECTIVE:  Pt. reports doing well today and no reports of pain in the R shoulder.  PAIN:  Are you having pain? Yes: 0/10, Pain location: left shoulder. Aggravating factors: reaching her right arm out away from her body. Relieving factors: stretching.     OBJECTIVE:   OT Treatment  Neuro re-ed: Facilitated L hand dexterity skills working to construct peg, washer, and dowel pieces within [employer].  Pt practiced small item pick up, storage of multiple items in palm, and translatory movements to discard pieces from fingertips.  Min vc for technique and grasping patterns to minimize dropping.  Therapeutic Exercise:   Pt. worked on [employer], and reciprocal motion using the UBE while seated for 7 min. with minimal resistance. Constant monitoring was provided.  Pt. worked on AR[employer] for right shoulder flexion, using a large incline wedge, pt placed push pins into a resistive cork board positioned on wedge, then OT repositioned cork board to facilitate abduction to remove pins.     Pt. presented with improved isolated right shoulder movements today,  requiring fewer cues to avoid compensation proximally with hiking of the shoulder. Pt. Is able to tolerate reaching higher, and out further today without pain. Pt. was able to grasp, store, and perform translatory movements with 3, and 4 pegs. Pt. Continues to have numbness in the right 2nd, 3rd digits, and thumb.  Pt. continues to work on improving RUE strength, and right functional hand use to improve reaching during ADLs, and IADL tasks.      Measurements   Right   Shoulder flexion: 145 Shoulder Abduction: 125 Wrist extension 45  Strength:  Shoulder flexion: 4/5 Abduction 4/5 Elbow flexion/extension 5/5  Grip strength: 42# Pinch strength: 16# 3Pt.: 12#  [place], 9-hole peg test: 25 sec.   PATIENT EDUCATION: Education details: RUE functioning Person educated: Patient Education method: Explanation, Demonstration, and Verbal cues Education comprehension: verbalized understanding, returned demonstration, tactile cues required, and needs further education   HOME EXERCISE PROGRAM Continue ongoing HEPs for the RUE     OT Long Term Goals - 07/08/21 1024       OT LONG TERM GOAL #1   Title Patient will be independent with home exercise program    Baseline 08/17/2021: Independent 03/23/2021: Pt. is independent, 4/5: will continue to upgrade exercises5/03/2021: Independent 07/08/2021: Independent with HEPs    Time 12    Period Weeks    Status On-going    Target Date 09/30/21      OT LONG TERM GOAL #4   Title Patient will increase right grip strength by 10 pounds to be able to open jars and containers with modified independence    Baseline 28 pounds at eval and difficulty with managing jars and containers. 10th visit: R: 34# Jars, and containers continue to be difficulty to open.  4/5: right grip 50# 05/20/2021: right grip strength 40# 07/08/2021: R: 49# 08/17/2021: 42#   Time 12    Period Weeks    Status Partially Met    Target Date 09/30/21      OT LONG TERM GOAL #6   Title  Patient will decrease right shoulder pain to 2 out of 10 or less with movement patterns for daily activities.    Baseline Increased pain in right shoulder, 5 out of 10 with movement. 10th visit: 8/10 right shoulder pain, 4/5:  2/10 with movement today but has increased pain other days and not yet consistent.5/03: 0/10 during movement today. 07/08/2021: 0/10 pain in the right shoulder 08/17/2021: 1/10   Time 12    Period Weeks    Status Partially Met    Target Date 09/30/21      OT LONG TERM GOAL #8   Title Will assess for potential return to work tasks as patient progresses with therapy    Baseline Patient currently unable to perform her job as outlined in her job description    Time 12    Period Weeks    Status Deferred      OT [place]  #9   TITLE Pt will improve strength to be able to reach up  to place items into closets, and cabinets independently    Baseline 08/17/2021: Pt. Is a able to reach up, however is not efficient, takes increased time, and is unable to grasp weighted objects from shelves. 07/08/2021:pt. has difficulty reaching up to place items in closets, and cabinets. 05/20/2021: Pt. continues to have difficulty 4/5: unable    Time 12    Period Weeks    Status Revised    Target Date 09/30/21      OT LONG TERM GOAL  #10   TITLE When on the passenger side, pt will be able to close door with modified independence    Baseline 07/08/2021: Met 4/5:  difficulty at times    Time 6    Period Weeks    Status Achieved      OT LONG TERM GOAL  #11   TITLE Pt will demonstrate ability to vacuum carpet of 2 rooms with use of right UE and no rest breaks    Baseline 08/17/2021: Independent, and efficient. 07/08/2021: Independent without rest breaks. 4/5:  difficulty with using vacuum. 5/03: Pt. has progressed and is now able to vacuum multiple rooms in her home    Time 6    Period Weeks    Status Achieved   Target Date 09/30/21      OT LONG TERM GOAL  #13   TITLE Pt will improve  strength to be able to push a full grocery cart   Baseline 08/17/2021: Pt. Has difficulty pushing a full size grocery cart. 07/08/2021: Pt. continues to have difficulty picking up a bag of fertilizer. 4/5: unable    Time 12    Period Weeks    Status revised   Target Date 07/15/21                Plan - 07/27/21 1258     Clinical Impression Statement Pt. continues to present with improved isolated right shoulder movements today, requiring fewer cues to avoid compensation proximally with hiking of the shoulder.  Pt reported no pain today, only a slight pulling/stretch with extended reaching with R shoulder abduction and OT adjusted activity accordingly to ensure painfree activity by lowering cork board on wedged incline.  Pt. Is able to tolerate reaching higher, and out further today without pain. Pt. was able to grasp, store, and perform translatory movements with [employer] peg pieces with minimal dropped pieces.  Pt. Continues to have numbness in the right 2nd, 3rd digits, and thumb.  Pt. continues to work on improving RUE strength, and right functional hand use to improve reaching during ADLs, and IADL tasks.     OT Occupational Profile and History Problem Focused Assessment - Including review of records relating to presenting problem    Body Structure / Function / Physical Skills ADL;Dexterity;ROM;Strength;Coordination;FMC;IADL;Pain;Sensation;UE functional use;Decreased knowledge of use of DME    Cognitive Skills Memory;Safety Awareness    Psychosocial Skills Environmental  Adaptations;Habits;Routines and Behaviors    Rehab Potential Good    Clinical Decision Making Several treatment options, min-mod task modification necessary    Comorbidities Affecting Occupational Performance: May have comorbidities impacting occupational performance    Modification or Assistance to Complete Evaluation  No modification of tasks or assist necessary to complete eval    OT Frequency 2x / week    OT Duration  12 weeks    OT Treatment/Interventions Self-care/ADL training;Cryotherapy;Paraffin;Therapeutic exercise;DME and/or AE instruction;Cognitive remediation/compensation;Neuromuscular education;Manual Therapy;Moist Heat;Contrast Bath;Therapeutic activities;Patient/family education    Consulted and Agree with Plan of Care Patient  Leta Speller, MS, OTR/L    Darleene Cleaver, OT 09/09/2021, 9:01 AM

## 2021-09-09 NOTE — Therapy (Signed)
OUTPATIENT PHYSICAL THERAPY TREATMENT NOTE      Patient Name: Kim Gomez MRN: [id number] DOB:[birth year], 66 y.o., female Today's Date: 09/09/2021  PCP: Gladstone Lighter, MD REFERRING PROVIDER: Vladimir Crofts, MD   PT End of Session - 09/09/21 0801     Visit Number 32    Number of Visits 33    Date for PT Re-Evaluation 09/16/21    Authorization Time Period Recert 02/26/32-10/04/9148    Progress Note Due on Visit 30    PT Start Time 0804    PT Stop Time 0844    PT Time Calculation (min) 40 min    Equipment Utilized During Treatment Gait belt    Activity Tolerance Patient tolerated treatment well;No increased pain    Behavior During Therapy WFL for tasks assessed/performed                   Past Medical History:  Diagnosis Date   Anxiety    Arthritis    legs, hands   Cellulitis of right knee    started on antibiotics 06/21/15.     Depression    Family history of adverse reaction to anesthesia    sister and mom -PONV   Hypercholesteremia    Hypertension    Past Surgical History:  Procedure Laterality Date   ABDOMINAL HYSTERECTOMY     COLONOSCOPY WITH PROPOFOL N/A 06/27/2015   Procedure: COLONOSCOPY WITH PROPOFOL;  Surgeon: Lucilla Lame, MD;  Location: [place];  Service: Endoscopy;  Laterality: N/A;   POLYPECTOMY  06/27/2015   Procedure: POLYPECTOMY;  Surgeon: Lucilla Lame, MD;  Location: [place];  Service: Endoscopy;;   TOTAL HIP ARTHROPLASTY Right 09/10/2019   Procedure: TOTAL HIP ARTHROPLASTY ANTERIOR APPROACH;  Surgeon: Lovell Sheehan, MD;  Location: [facility];  Service: Orthopedics;  Laterality: Right;   Patient Active Problem List   Diagnosis Date Noted   Acute stroke due to ischemia ([place]) 08/10/2020   History of total hip replacement, right 09/10/2019   Special screening for malignant neoplasms, colon    Benign neoplasm of ascending colon    Benign neoplasm of descending colon     REFERRING DIAG: I63.9 (ICD-10-CM) - Stroke  ([place])  THERAPY DIAG:  Muscle weakness (generalized)  Stiffness of right shoulder, not elsewhere classified  Chronic right shoulder pain  Rationale for Evaluation and Treatment Rehabilitation  PERTINENT HISTORY: 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 Pt reports a fall on October 29 th. Pt reports she hurt the R UE with the fall and she reports some stiffness in the knee as well as general weakness on the right side. Pt reports she has had 2 strokes but the first went under the radar and was only evidenced by imaging presented to her by her MD. Pt reports she has been modifying her activities in order to be careful not to fall since the stroke. Reports she has been taking her time with a lot of things. Pt reports she has a membership at [employer] and she has been walking on the treadmill 2-3 times per week, she reports she walks on the treadmill for an hour at a time following working her way up. Pt reports ocassional numbness and tingling in her hands.   PRECAUTIONS: none  SUBJECTIVE: Pt rates current pain level as a 1/10. Reports no issues using her RUE over the weekend, reports no current concerns.  PAIN:  Are you having pain?  Yes: NPRS scale: 1/10 Pain location: Right anterior shoulder  Pain description: "stiff" Aggravating factors: prolonged positioning  Relieving factors: PT exercises      TODAY'S TREATMENT:  09/09/21   INTERVENTIONS  09/09/2021:  Therex: Heat donned to R shoulder while pt performs the following -  On plinth, supine: RUE: AROM supine flexion, abduction 15x  for each within pain-free range  Abduction and flexion stretch 2x30 sec for each   5# weighted bar shoulder flex B 12x 2 sets  5# weighted bar chest press + 10x  2# serratus punch in supine 12x2 sets.   Serratus punch with 4# weight RUE only 10x  Side-lying R shoulder abduction with 1# 10x2 sets.  Easy-medium  Side-lying R shoulder ER 10x2 sets with 1# weight. Pt rates easy (improvement from previous session perceived difficulty)  Seated YTB R shoulder IR 2x12  Seated YTB ER/pull-aparts 12x   Matrix cable machine rows  2.5# 10x 7.5# 2x12  BTB shoulder ext 2x12 B UE  Shoulder shrugs 10 x2 sets  Shoulder circles CW/CC 5x each. Requires VC/demo for improved coordination  Skin WNL prior to and upon removal of heat. Pt continues to report heat feels good to shoulder.    PATIENT EDUCATION Education details:Exercise technique, Economist Person educated: Patient Education method: Explanation, Demonstration, Tactile cues, and Verbal cues Education comprehension: verbalized understanding, returned demonstration, and verbal cues required   HOME EXERCISE PROGRAM: No updates today, pt to continue HEP as previously given Access Code: [email or web address] URL: [email or web address] Date: 09/02/2021 Prepared by: Ricard Dillon  Exercises - Sidelying Shoulder External Rotation  - 1 x daily - 5 x weekly - 3 sets - 10 reps - 2 seconds hold   PT Short Term Goals        PT SHORT TERM GOAL #1   Title Patient will be independent in home exercise program to improve strength/mobility for better functional independence with ADLs.    Baseline no HEP at this time    Time 4    Period Weeks    Status Achieved    Target Date 02/17/21      PT SHORT TERM GOAL #2   Title Patient will be independent in progressive shoulder home exercise program in order to improve her shoulder strength and function    Baseline Patient has initial home exercise program and is comfortable with that but it is yet to be progressed for higher level activities ; 7/26: working toward independence   Time 2   Period Weeks    Status New    Target Date 08/26/2021              PT Long Term Goals        PT LONG TERM GOAL #1   Title Patient will increase FOTO score to equal to or greater than  50   to  demonstrate statistically significant improvement in mobility and quality of life.    Baseline 41 on 01/20/21    Time 8    Period Weeks    Status Achieved    Target Date 03/17/21      PT LONG TERM GOAL #2   Title Pt will improve mini BEST test by 4 points or more in order to indicate clinically significant improvent in balance.    Baseline see flowsheets 27 on 2/27    Time 12    Period Weeks    Status Achieved    Target Date 04/14/21      PT LONG TERM GOAL #  3   Title Patient  will complete five times sit to stand test in < 15 seconds indicating an increased LE strength and improved balance.    Baseline 13.9 sec on 2/27    Time 12    Period Weeks    Status Achieved    Target Date 04/14/21      PT LONG TERM GOAL #4   Title Patient will increase 10 meter walk test to >1.79ms as to improve gait speed for better community ambulation and to reduce fall risk.    Baseline .86 m/s on 01/20/21 2/27: 1.27 m/s    Time 8    Period Weeks    Status Achieved    Target Date 03/17/21      PT LONG TERM GOAL #5   Title Pt will improve dual task TUG to less than 20 seconds in order to indicate improved cognitive and motor tasks.    Baseline 28.82 on 01/20/21 14.98 sec on 2/27    Time 12    Period Weeks    Status Achieved    Target Date 04/14/21      Additional Long Term Goals   Additional Long Term Goals Yes      PT LONG TERM GOAL #6   Title Patient will improve right shoulder by 1 increment on manual muscle test grading scale in order to indicate improved right upper extremity strength.    Baseline 3-/5 her right shoulder flexion and abduction(due to range of motion restriction) 3+ with external rotation at 0 degrees abduction; 05/18/2021=Access Code: [email or web address] URL: [email or web address] ; 7/26: grossly 4+/5 with exception of flexion and IR which are both 4-/5   Time 12    Period Weeks    Status Achieved   Target Date 08/10/21      PT LONG TERM GOAL #7   Title Patient will  improve QuickDASH score by 12.5 points in order to indicate significantly improved subjective rating of right shoulder function    Baseline 50% on 03/23/2021; 05/18/2021= 23%    Time 8    Period Weeks    Status Achieved    Target Date 05/18/21      PT LONG TERM GOAL #8   Title Patient will improve right shoulder active range of motion by 30 degrees with elevation and by 15 degrees with external rotation in order to indicate improved shoulder function for overhead activities and for self-care activities    Baseline 90 degrees flexion range of motion , 85 degrees abduction range of motion, 25 degrees external rotation range of motion ( all AROM); 05/18/2021=R Shoulder AROM: Flex= 158 deg; ABD= 152; ER= 44    Time 8    Period Weeks    Status Achieved    Target Date 05/18/21      PT LONG TERM GOAL  #9   TITLE Patient will improve right shoulder passive range of motion by 30 degrees of elevation by 10 degrees with external rotation in order to indicate improved right shoulder range of motion for progressive functional activities    Baseline 120 degrees flexion, 123 degrees abduction, 50 degrees external rotation at 45 degrees abduction on 03/23/2021; 05/18/2021=R Shoulder PROM: Flex= 164 deg; ABD= 160; ER= 52; 7/26: Flex 150 deg, ABD 110 deg *pain limited when tested, ER 49 deg.; 8/9: 160 deg flex, 115 abduction, ER 60 deg.   Time 5   Period Weeks    Status Partially Met    Target Date 09/16/2021      PT  LONG TERM GOAL  #10   TITLE Patient will improve right shoulder active range of motion by 15 degrees with elevation and by 10 degrees with external rotation in order to indicate improved shoulder function for overhead activities including reaching for 2nd and 3rd shelf in kitchen cabinet and for self-care activities    Baseline 05/18/2021=R Shoulder AROM: Flex= 158 deg; ABD= 152; ER= 44 and Patient reports difficulty reaching overhead onto 2nd and 3rd shelf at home; 7/26: 145 deg flex, 143 deg abduction, 40  deg; 8/9: 155 Flex, 135 deg abduction, 45 deg ER   Time 5    Period Weeks    Status On-going    Target Date 09/16/2021              Plan -     Clinical Impression Statement Pt with improvement AEB reporting reduced perceived difficulty with therex today. She reports majority of strengthening interventions feel good. Pt only had some difficulty with coordination for shoulder circles. Pt ready for next visit to be last where PT will advance and review her HEP prior to discharge. The pt will continue to benefit from skilled PT in order to improve shoulder function and QOL.    Personal Factors and Comorbidities Age;Comorbidity 1;Comorbidity 2    Comorbidities arthritis, anxiety, HTN, hyperlipedemia.    Examination-Activity Limitations Carry;Lift;Locomotion Level    Examination-Participation Restrictions Cleaning;Yard Work;Meal Prep    Stability/Clinical Decision Making Stable/Uncomplicated    Rehab Potential Good    PT Frequency 1x / week    PT Duration 5 weeks    PT Treatment/Interventions ADLs/Self Care Home Management;Gait training;Therapeutic activities;Functional mobility training;Therapeutic exercise;Balance training;Neuromuscular re-education;Manual techniques;Energy conservation;Passive range of motion;Cryotherapy;Electrical Stimulation;Moist Heat;Patient/family education;Dry needling;Joint Manipulations    PT Next Visit Plan shoulder strenghtening & ROM exercises, continue POC   PT Home Exercise Plan 05/18/2021: Access Code: [email or web address]  URL: [email or web address]  Access Code: [email or web address]  URL: [email or web address]; no updates    Consulted and Agree with Plan of Care Patient              Zollie Pee, PT 09/09/2021, 9:52 AM

## 2021-09-14 ENCOUNTER — Encounter: Payer: Self-pay | Admitting: Occupational Therapy

## 2021-09-14 ENCOUNTER — Ambulatory Visit: Payer: No Typology Code available for payment source | Admitting: Occupational Therapy

## 2021-09-14 DIAGNOSIS — R278 Other lack of coordination: Secondary | ICD-10-CM

## 2021-09-14 DIAGNOSIS — M6281 Muscle weakness (generalized): Secondary | ICD-10-CM

## 2021-09-14 NOTE — Therapy (Signed)
Occupational Therapy Treatment Note   Patient Name: Kim Gomez MRN: [id number] DOB:1955/07/30, 66 y.o., female Today's Date: 09/14/2021  PCP: Gladstone Lighter, MD REFERRING PROVIDER: Jennings Books, MD    OT End of Session - 09/14/21 [phone]     Visit Number 48    Number of Visits 58    Date for OT Re-Evaluation 09/30/21    Authorization Time Period Progress report period starting 08/17/2021    OT Start Time 0915    OT Stop Time 1000    OT Time Calculation (min) 45 min    Activity Tolerance Patient tolerated treatment well    Behavior During Therapy [facility] for tasks assessed/performed                       Past Medical History:  Diagnosis Date   Anxiety    Arthritis    legs, hands   Cellulitis of right knee    started on antibiotics 06/21/15.     Depression    Family history of adverse reaction to anesthesia    sister and mom -PONV   Hypercholesteremia    Hypertension    Past Surgical History:  Procedure Laterality Date   ABDOMINAL HYSTERECTOMY     COLONOSCOPY WITH PROPOFOL N/A 06/27/2015   Procedure: COLONOSCOPY WITH PROPOFOL;  Surgeon: Lucilla Lame, MD;  Location: [place];  Service: Endoscopy;  Laterality: N/A;   POLYPECTOMY  06/27/2015   Procedure: POLYPECTOMY;  Surgeon: Lucilla Lame, MD;  Location: [place];  Service: Endoscopy;;   TOTAL HIP ARTHROPLASTY Right 09/10/2019   Procedure: TOTAL HIP ARTHROPLASTY ANTERIOR APPROACH;  Surgeon: Lovell Sheehan, MD;  Location: [facility];  Service: Orthopedics;  Laterality: Right;   Patient Active Problem List   Diagnosis Date Noted   Acute stroke due to ischemia ([place]) 08/10/2020   History of total hip replacement, right 09/10/2019   Special screening for malignant neoplasms, colon    Benign neoplasm of ascending colon    Benign neoplasm of descending colon      REFERRING DIAG: CVA  THERAPY DIAG:  Muscle weakness (generalized)  Other lack of coordination  Rationale for Evaluation and  Treatment Rehabilitation  PERTINENT HISTORY: Pt. is a 66 y.o. female with PMH significant for HTN, HLD, anxiety disorder, recently hospitalized for COVID-19 infection (08/06/2020) at an outside facility. Patient was brought to the ED on 7/24 after she had a sudden onset of numbness involving right side of her body without any motor deficits, dysarthria, dysphagia. 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 Per chart for her f/u neuro visit:Left ischemic thalamic/internal capsule infarct presenting with right-sided numbness, weakness - now with residual cognitive fog (improving), imbalance (improving), fatigue, right first + second digit numbness, in patient with known vascular risk factors of hypertension, dyslipidemia, lack of physical activity, snoring  PRECAUTIONS: None  SUBJECTIVE:  Pt. reports having had a good weekend.  PAIN:  Are you having pain? Yes: 1/10, Pain location: right shoulder. Aggravating factors: reaching her right arm out away from her body. Relieving factors: stretching.      OBJECTIVE:   OT Treatment    Pt. worked on right shoulder ROM, strengthening, and reciprocal motion using the UBE while seated for 8 min. With minimal resistance. Constant monitoring was provided. Pt. worked on functional RUE reaching with [facility] skills reaching up to place 1&1/2" pegs onto an elevated vertical [employer].  Pt. Board was placed at  a steep  vertical angle higher reaching, as well as at a lower vertical angle to challenge sustained reaching out with the RUE when flipping the 1 &1/2" pegs. Pt. worked on translatory movements, moving the pegs from the tip of her fingers, to her palm, through her hand, and back out to the tip of her 2nd digit, 3rd digit, and thumb.  Pt. worked on reaching up into right shoulder flexion, and abduction to place to pegs in the container after performing translatory movements.      Pt. reports that  her arm feels better today overall. Reporting 1/10 pain in the right shoulder.  Pt. presented with improved isolated right shoulder movements today, however does require cues to avoid compensation proximally with hiking of the shoulder. Pt. was able to grasp, store, and perform translatory movements with 3, and 4 pegs with increased time to complete. Pt. Tolerated the reaching tasks well initially, however fatigued as the task progressed while manipulating the top rows of pegs, and when challenged with adding speed to the coordination task. Pt. reported numbness in the right 2nd, 3rd digits, and thumb. Pt. continues to work on improving RUE strength, and right functional hand use to improve reaching during ADLs, and IADL tasks.        Measurements   Right   Shoulder flexion: 145 Shoulder Abduction: 125 Wrist extension 45  Strength:  Shoulder flexion: 4/5 Abduction 4/5 Elbow flexion/extension 5/5  Grip strength: 42# Pinch strength: 16# 3Pt.: 12#  [place], 9-hole peg test: 25 sec.   PATIENT EDUCATION: Education details: RUE functioning Person educated: Patient Education method: Explanation, Demonstration, and Verbal cues Education comprehension: verbalized understanding, returned demonstration, tactile cues required, and needs further education   HOME EXERCISE PROGRAM Continue ongoing HEPs for the RUE     OT Long Term Goals - 07/08/21 1024       OT LONG TERM GOAL #1   Title Patient will be independent with home exercise program    Baseline 08/17/2021: Independent 03/23/2021: Pt. is independent, 4/5: will continue to upgrade exercises5/03/2021: Independent 07/08/2021: Independent with HEPs    Time 12    Period Weeks    Status On-going    Target Date 09/30/21      OT LONG TERM GOAL #4   Title Patient will increase right grip strength by 10 pounds to be able to open jars and containers with modified independence    Baseline 28 pounds at eval and difficulty with managing jars and  containers. 10th visit: R: 34# Jars, and containers continue to be difficulty to open.  4/5: right grip 50# 05/20/2021: right grip strength 40# 07/08/2021: R: 49# 08/17/2021: 42#   Time 12    Period Weeks    Status Partially Met    Target Date 09/30/21      OT LONG TERM GOAL #6   Title Patient will decrease right shoulder pain to 2 out of 10 or less with movement patterns for daily activities.    Baseline Increased pain in right shoulder, 5 out of 10 with movement. 10th visit: 8/10 right shoulder pain, 4/5:  2/10 with movement today but has increased pain other days and not yet consistent.5/03: 0/10 during movement today. 07/08/2021: 0/10 pain in the right shoulder 08/17/2021: 1/10   Time 12    Period Weeks    Status Partially Met    Target Date 09/30/21      OT LONG TERM GOAL #8   Title Will assess for potential return to work tasks as patient  progresses with therapy    Baseline Patient currently unable to perform her job as outlined in her job description    Time 12    Period Weeks    Status Deferred      OT [place]  #9   TITLE Pt will improve strength to be able to reach up to place items into closets, and cabinets independently    Baseline 08/17/2021: Pt. Is a able to reach up, however is not efficient, takes increased time, and is unable to grasp weighted objects from shelves. 07/08/2021:pt. has difficulty reaching up to place items in closets, and cabinets. 05/20/2021: Pt. continues to have difficulty 4/5: unable    Time 12    Period Weeks    Status Revised    Target Date 09/30/21      OT LONG TERM GOAL  #10   TITLE When on the passenger side, pt will be able to close door with modified independence    Baseline 07/08/2021: Met 4/5:  difficulty at times    Time 6    Period Weeks    Status Achieved      OT LONG TERM GOAL  #11   TITLE Pt will demonstrate ability to vacuum carpet of 2 rooms with use of right UE and no rest breaks    Baseline 08/17/2021: Independent, and  efficient. 07/08/2021: Independent without rest breaks. 4/5:  difficulty with using vacuum. 5/03: Pt. has progressed and is now able to vacuum multiple rooms in her home    Time 6    Period Weeks    Status Achieved   Target Date 09/30/21      OT LONG TERM GOAL  #13   TITLE Pt will improve strength to be able to push a full grocery cart   Baseline 08/17/2021: Pt. Has difficulty pushing a full size grocery cart. 07/08/2021: Pt. continues to have difficulty picking up a bag of fertilizer. 4/5: unable    Time 12    Period Weeks    Status revised   Target Date 07/15/21                Plan - 07/27/21 1258     Clinical Impression Statement Pt. reports that her arm feels better today overall. Reporting 1/10 pain in the right shoulder.  Pt. presented with improved isolated right shoulder movements today, however does require cues to avoid compensation proximally with hiking of the shoulder. Pt. was able to grasp, store, and perform translatory movements with 3, and 4 pegs with increased time to complete. Pt. Tolerated the reaching tasks well initially, however fatigued as the task progressed while manipulating the top rows of pegs, and when challenged with adding speed to the coordination task. Pt. reported numbness in the right 2nd, 3rd digits, and thumb. Pt. continues to work on improving RUE strength, and right functional hand use to improve reaching during ADLs, and IADL tasks.              OT Occupational Profile and History Problem Focused Assessment - Including review of records relating to presenting problem    Body Structure / Function / Physical Skills ADL;Dexterity;ROM;Strength;Coordination;FMC;IADL;Pain;Sensation;UE functional use;Decreased knowledge of use of DME    Cognitive Skills Memory;Safety Awareness    Psychosocial Skills Environmental  Adaptations;Habits;Routines and Behaviors    Rehab Potential Good    Clinical Decision Making Several treatment options, min-mod task  modification necessary    Comorbidities Affecting Occupational Performance: May have comorbidities impacting occupational performance  Modification or Assistance to Complete Evaluation  No modification of tasks or assist necessary to complete eval    OT Frequency 2x / week    OT Duration 12 weeks    OT Treatment/Interventions Self-care/ADL training;Cryotherapy;Paraffin;Therapeutic exercise;DME and/or AE instruction;Cognitive remediation/compensation;Neuromuscular education;Manual Therapy;Moist Heat;Contrast Bath;Therapeutic activities;Patient/family education    Consulted and Agree with Plan of Care Patient              Harrel Carina, MS, OTR/L   Harrel Carina, OT 09/14/2021, 9:42 AM

## 2021-09-16 ENCOUNTER — Ambulatory Visit: Payer: No Typology Code available for payment source | Admitting: Occupational Therapy

## 2021-09-16 ENCOUNTER — Encounter: Payer: No Typology Code available for payment source | Admitting: Speech Pathology

## 2021-09-16 ENCOUNTER — Encounter: Payer: Self-pay | Admitting: Occupational Therapy

## 2021-09-16 ENCOUNTER — Ambulatory Visit: Payer: No Typology Code available for payment source

## 2021-09-16 DIAGNOSIS — M6281 Muscle weakness (generalized): Secondary | ICD-10-CM

## 2021-09-16 DIAGNOSIS — G8929 Other chronic pain: Secondary | ICD-10-CM

## 2021-09-16 DIAGNOSIS — R278 Other lack of coordination: Secondary | ICD-10-CM

## 2021-09-16 DIAGNOSIS — M25611 Stiffness of right shoulder, not elsewhere classified: Secondary | ICD-10-CM

## 2021-09-16 NOTE — Therapy (Signed)
Occupational Therapy Treatment Note   Patient Name: Kim Gomez MRN: [id number] DOB:04-15-1955, 66 y.o., female Today's Date: 09/17/2021  PCP: Gladstone Lighter, MD REFERRING PROVIDER: Jennings Books, MD                Past Medical History:  Diagnosis Date   Anxiety    Arthritis    legs, hands   Cellulitis of right knee    started on antibiotics 06/21/15.     Depression    Family history of adverse reaction to anesthesia    sister and mom -PONV   Hypercholesteremia    Hypertension    Past Surgical History:  Procedure Laterality Date   ABDOMINAL HYSTERECTOMY     COLONOSCOPY WITH PROPOFOL N/A 06/27/2015   Procedure: COLONOSCOPY WITH PROPOFOL;  Surgeon: Lucilla Lame, MD;  Location: [facility];  Service: Endoscopy;  Laterality: N/A;   POLYPECTOMY  06/27/2015   Procedure: POLYPECTOMY;  Surgeon: Lucilla Lame, MD;  Location: [place];  Service: Endoscopy;;   TOTAL HIP ARTHROPLASTY Right 09/10/2019   Procedure: TOTAL HIP ARTHROPLASTY ANTERIOR APPROACH;  Surgeon: Lovell Sheehan, MD;  Location: [facility];  Service: Orthopedics;  Laterality: Right;   Patient Active Problem List   Diagnosis Date Noted   Acute stroke due to ischemia ([place]) 08/10/2020   History of total hip replacement, right 09/10/2019   Special screening for malignant neoplasms, colon    Benign neoplasm of ascending colon    Benign neoplasm of descending colon      REFERRING DIAG: CVA  THERAPY DIAG:  Muscle weakness (generalized)  Other lack of coordination  Stiffness of right shoulder, not elsewhere classified  Chronic right shoulder pain  Rationale for Evaluation and Treatment Rehabilitation  PERTINENT HISTORY: Pt. is a 66 y.o. female with PMH significant for HTN, HLD, anxiety disorder, recently hospitalized for COVID-19 infection (08/06/2020) at an outside facility. Patient was brought to the ED on 7/24 after she had a sudden onset of numbness involving right side of her body  without any motor deficits, dysarthria, dysphagia. 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 Per chart for her f/u neuro visit:Left ischemic thalamic/internal capsule infarct presenting with right-sided numbness, weakness - now with residual cognitive fog (improving), imbalance (improving), fatigue, right first + second digit numbness, in patient with known vascular risk factors of hypertension, dyslipidemia, lack of physical activity, snoring  PRECAUTIONS: None  SUBJECTIVE:  Pt reports she is doing well, she graduated today from Physical Therapy and will continue with home exercises.  She is also going to the gym several times a week and trying to do weights and walking on the treadmill   PAIN:  Are you having pain? Yes: 0/10, Pain location: right shoulder. Aggravating factors: reaching her right arm out away from her body. Relieving factors: stretching.      OBJECTIVE:   OT Treatment :  Therapeutic Exercises:  Pt seen this date for UE reaching tasks with use of SAEBO ball looped tower performed from a seated position.  Pt able to complete multiple trials of each level and able to complete all 4 levels.  No pain noted this date in her shoulder prior to tx, during or at the end of session.   Neuromuscular reeducation:  Pt seen for combining reaching with use of large black wedge, pt picking up and placing small pegs for fine motor coordination skills with right UE in  combination with reaching patterns.   Additional reaching in  a lower plane with use of Connect 4 board on tabletop, pt placing checkers in the board, one at a time and alternating red and black patterns.  Advanced to performing with translatory movements of the hand as well as using the hand for storage with up to 3 checkers and then up to 4.  Increased focus when attempting to move a specific color from the palm.      Measurements   Right   Shoulder flexion:  145 Shoulder Abduction: 125 Wrist extension 45  Strength:  Shoulder flexion: 4/5 Abduction 4/5 Elbow flexion/extension 5/5  Grip strength: 42# Pinch strength: 16# 3Pt.: 12#  [place], 9-hole peg test: 25 sec.   PATIENT EDUCATION: Education details: RUE functioning Person educated: Patient Education method: Explanation, Demonstration, and Verbal cues Education comprehension: verbalized understanding, returned demonstration, tactile cues required, and needs further education   HOME EXERCISE PROGRAM Continue ongoing HEPs for the RUE     OT Long Term Goals - 07/08/21 1024       OT LONG TERM GOAL #1   Title Patient will be independent with home exercise program    Baseline 08/17/2021: Independent 03/23/2021: Pt. is independent, 4/5: will continue to upgrade exercises5/03/2021: Independent 07/08/2021: Independent with HEPs    Time 12    Period Weeks    Status On-going    Target Date 09/30/21      OT LONG TERM GOAL #4   Title Patient will increase right grip strength by 10 pounds to be able to open jars and containers with modified independence    Baseline 28 pounds at eval and difficulty with managing jars and containers. 10th visit: R: 34# Jars, and containers continue to be difficulty to open.  4/5: right grip 50# 05/20/2021: right grip strength 40# 07/08/2021: R: 49# 08/17/2021: 42#   Time 12    Period Weeks    Status Partially Met    Target Date 09/30/21      OT LONG TERM GOAL #6   Title Patient will decrease right shoulder pain to 2 out of 10 or less with movement patterns for daily activities.    Baseline Increased pain in right shoulder, 5 out of 10 with movement. 10th visit: 8/10 right shoulder pain, 4/5:  2/10 with movement today but has increased pain other days and not yet consistent.5/03: 0/10 during movement today. 07/08/2021: 0/10 pain in the right shoulder 08/17/2021: 1/10   Time 12    Period Weeks    Status Partially Met    Target Date 09/30/21      OT LONG TERM  GOAL #8   Title Will assess for potential return to work tasks as patient progresses with therapy    Baseline Patient currently unable to perform her job as outlined in her job description    Time 12    Period Weeks    Status Deferred      OT [place]  #9   TITLE Pt will improve strength to be able to reach up to place items into closets, and cabinets independently    Baseline 08/17/2021: Pt. Is a able to reach up, however is not efficient, takes increased time, and is unable to grasp weighted objects from shelves. 07/08/2021:pt. has difficulty reaching up to place items in closets, and cabinets. 05/20/2021: Pt. continues to have difficulty 4/5: unable    Time 12    Period Weeks    Status Revised    Target Date 09/30/21      OT LONG  TERM GOAL  #10   TITLE When on the passenger side, pt will be able to close door with modified independence    Baseline 07/08/2021: Met 4/5:  difficulty at times    Time 6    Period Weeks    Status Achieved      OT LONG TERM GOAL  #11   TITLE Pt will demonstrate ability to vacuum carpet of 2 rooms with use of right UE and no rest breaks    Baseline 08/17/2021: Independent, and efficient. 07/08/2021: Independent without rest breaks. 4/5:  difficulty with using vacuum. 5/03: Pt. has progressed and is now able to vacuum multiple rooms in her home    Time 6    Period Weeks    Status Achieved   Target Date 09/30/21      OT LONG TERM GOAL  #13   TITLE Pt will improve strength to be able to push a full grocery cart   Baseline 08/17/2021: Pt. Has difficulty pushing a full size grocery cart. 07/08/2021: Pt. continues to have difficulty picking up a bag of fertilizer. 4/5: unable    Time 12    Period Weeks    Status revised   Target Date 07/15/21                Plan - 07/27/21 1258     Clinical Impression Statement  Pt continues to progress in all areas.  No pain noted this date in the shoulder and was able to engage in reaching tasks with increasing  demands of reach over extended time frame and multiple repetition of reaching patterns.  Pt progressing with fine motor coordination, dexterity and speed of movement patterns.  Working on the redevelopment of hand skills to utilize translatory movements of the hand and using hand for storage.  Responds well to cues during treatment session.  Continue to work towards goals to increase ROM, strength, coordination and functional arm use for ADL and IADL tasks at home and in the community.    OT Occupational Profile and History Problem Focused Assessment - Including review of records relating to presenting problem    Body Structure / Function / Physical Skills ADL;Dexterity;ROM;Strength;Coordination;FMC;IADL;Pain;Sensation;UE functional use;Decreased knowledge of use of DME    Cognitive Skills Memory;Safety Awareness    Psychosocial Skills Environmental  Adaptations;Habits;Routines and Behaviors    Rehab Potential Good    Clinical Decision Making Several treatment options, min-mod task modification necessary    Comorbidities Affecting Occupational Performance: May have comorbidities impacting occupational performance    Modification or Assistance to Complete Evaluation  No modification of tasks or assist necessary to complete eval    OT Frequency 2x / week    OT Duration 12 weeks    OT Treatment/Interventions Self-care/ADL training;Cryotherapy;Paraffin;Therapeutic exercise;DME and/or AE instruction;Cognitive remediation/compensation;Neuromuscular education;Manual Therapy;Moist Heat;Contrast Bath;Therapeutic activities;Patient/family education    Consulted and Agree with Plan of Care Patient              Darvis Croft Colette Ribas, OT 09/17/2021, 9:53 PM

## 2021-09-16 NOTE — Therapy (Signed)
OUTPATIENT PHYSICAL THERAPY TREATMENT NOTE/DISCHARGE SUMMARY      Patient Name: Kim Gomez MRN: [id number] DOB:[birth year], 66 y.o., female Today's Date: 09/16/2021  PCP: Gladstone Lighter, MD REFERRING PROVIDER: Vladimir Crofts, MD   PT End of Session - 09/16/21 1710     Visit Number 33    Number of Visits 33    Date for PT Re-Evaluation 09/16/21    Authorization Time Period Recert 02/27/7987-03/01/9415    Progress Note Due on Visit 30    PT Start Time 0803    PT Stop Time 0842    PT Time Calculation (min) 39 min    Equipment Utilized During Treatment --    Activity Tolerance Patient tolerated treatment well;No increased pain    Behavior During Therapy WFL for tasks assessed/performed                    Past Medical History:  Diagnosis Date   Anxiety    Arthritis    legs, hands   Cellulitis of right knee    started on antibiotics 06/21/15.     Depression    Family history of adverse reaction to anesthesia    sister and mom -PONV   Hypercholesteremia    Hypertension    Past Surgical History:  Procedure Laterality Date   ABDOMINAL HYSTERECTOMY     COLONOSCOPY WITH PROPOFOL N/A 06/27/2015   Procedure: COLONOSCOPY WITH PROPOFOL;  Surgeon: Lucilla Lame, MD;  Location: [place];  Service: Endoscopy;  Laterality: N/A;   POLYPECTOMY  06/27/2015   Procedure: POLYPECTOMY;  Surgeon: Lucilla Lame, MD;  Location: [place];  Service: Endoscopy;;   TOTAL HIP ARTHROPLASTY Right 09/10/2019   Procedure: TOTAL HIP ARTHROPLASTY ANTERIOR APPROACH;  Surgeon: Lovell Sheehan, MD;  Location: [facility];  Service: Orthopedics;  Laterality: Right;   Patient Active Problem List   Diagnosis Date Noted   Acute stroke due to ischemia ([place]) 08/10/2020   History of total hip replacement, 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 Pt reports a fall on October 29 th. Pt reports she hurt the R UE with the fall and she reports some stiffness in the knee as well as general weakness on the right side. Pt reports she has had 2 strokes but the first went under the radar and was only evidenced by imaging presented to her by her MD. Pt reports she has been modifying her activities in order to be careful not to fall since the stroke. Reports she has been taking her time with a lot of things. Pt reports she has a membership at [employer] and she has been walking on the treadmill 2-3 times per week, she reports she walks on the treadmill for an hour at a time following working her way up. Pt reports ocassional numbness and tingling in her hands.   PRECAUTIONS: none  SUBJECTIVE: Pt report no shoulder pain currently. Says her shoulder has been good. PAIN:  Are you having pain? Yes: NPRS scale: 0/10 Pain location: Right anterior shoulder  Pain description: "stiff" Aggravating factors: prolonged positioning  Relieving factors: PT exercises      TODAY'S TREATMENT:  09/16/21   INTERVENTIONS  09/16/2021:  Therex: Heat donned to R shoulder while pt performs the below interventions  Goals reviewed for d/c session: see goal section for details.  On plinth, supine: RUE: AROM supine flexion, abduction, ER 15x  for each within pain-free range  RUE Abduction and flexion stretch 2x30 sec for each  R shoulder ER stretch 30 sec   AROM and PROM measurements taken for flexion, abduction and ER of RUE, see goal section for details.  FOTO: 37   Review of HEP and how to advance, modify as necessary to maintain and continue gains beyond  PT (see below)  RUE: RTB shoulder ER 2x15 B YTB shoulder abduction 15x AROM shoulder abduction 15x YTB shoulder IR 2x15    PATIENT EDUCATION Education details:Exercise technique, body mechanics, HEP, d/c recommendations Person educated: Patient Education method: Explanation, Demonstration, Tactile cues, Verbal cues, and Handouts Education comprehension: verbalized understanding, returned demonstration, and verbal cues required   HOME EXERCISE PROGRAM:  8/30: Access Code: [email or web address] URL: [email or web address] Date: 09/16/2021 Prepared by: Ricard Dillon  Exercises - Shoulder External Rotation and Scapular Retraction with Resistance  - 1 x daily - 4 x weekly - 2 sets - 15 reps - Standing Single Arm Shoulder Abduction with Resistance  - 1 x daily - 4 x weekly - 2 sets - 15 reps - Shoulder Internal Rotation with Resistance  - 1 x daily - 4 x weekly - 2 sets - 15 reps - Shoulder Abduction - Thumbs Up  - 1 x daily - 4 x weekly - 2 sets - 15 reps  Access Code: [email or web address] URL: [email or web address] Date: 09/02/2021 Prepared by: Ricard Dillon  Exercises - Sidelying Shoulder External Rotation  - 1 x daily - 5 x weekly - 3 sets - 10 reps - 2 seconds hold   PT Short Term Goals        PT SHORT TERM GOAL #1   Title Patient will be independent in home exercise program to improve strength/mobility for better functional independence with ADLs.    Baseline no HEP at this time    Time 4    Period Weeks    Status Achieved    Target Date 02/17/21      PT SHORT TERM GOAL #2   Title Patient will be independent in progressive shoulder home exercise program in order to improve her shoulder strength and function    Baseline Patient has initial home exercise program and is comfortable with that but it is yet to be progressed for higher level activities ; 7/26: working toward independence; 8/30: pt is indep   Time 2   Period Weeks    Status Achieved    Target Date  08/26/2021              PT Long Term Goals        PT LONG TERM GOAL #1   Title Patient will increase FOTO score to equal to or greater than  50   to demonstrate statistically significant improvement in mobility and quality of life.    Baseline 41 on 01/20/21 ; 8/30: 64   Time 8    Period Weeks    Status Achieved    Target Date 03/17/21      PT LONG TERM GOAL #2   Title Pt will improve mini BEST test by 4 points or more in order to indicate clinically significant improvent in balance.  Baseline see flowsheets 27 on 2/27    Time 12    Period Weeks    Status Achieved    Target Date 04/14/21      PT LONG TERM GOAL #3   Title Patient  will complete five times sit to stand test in < 15 seconds indicating an increased LE strength and improved balance.    Baseline 13.9 sec on 2/27    Time 12    Period Weeks    Status Achieved    Target Date 04/14/21      PT LONG TERM GOAL #4   Title Patient will increase 10 meter walk test to >1.33ms as to improve gait speed for better community ambulation and to reduce fall risk.    Baseline .86 m/s on 01/20/21 2/27: 1.27 m/s    Time 8    Period Weeks    Status Achieved    Target Date 03/17/21      PT LONG TERM GOAL #5   Title Pt will improve dual task TUG to less than 20 seconds in order to indicate improved cognitive and motor tasks.    Baseline 28.82 on 01/20/21 14.98 sec on 2/27    Time 12    Period Weeks    Status Achieved    Target Date 04/14/21      Additional Long Term Goals   Additional Long Term Goals Yes      PT LONG TERM GOAL #6   Title Patient will improve right shoulder by 1 increment on manual muscle test grading scale in order to indicate improved right upper extremity strength.    Baseline 3-/5 her right shoulder flexion and abduction(due to range of motion restriction) 3+ with external rotation at 0 degrees abduction; 05/18/2021=Access Code: [email or web address] URL: [email or web address] ; 7/26: grossly 4+/5 with  exception of flexion and IR which are both 4-/5   Time 12    Period Weeks    Status Achieved   Target Date 08/10/21      PT LONG TERM GOAL #7   Title Patient will improve QuickDASH score by 12.5 points in order to indicate significantly improved subjective rating of right shoulder function    Baseline 50% on 03/23/2021; 05/18/2021= 23%    Time 8    Period Weeks    Status Achieved    Target Date 05/18/21      PT LONG TERM GOAL #8   Title Patient will improve right shoulder active range of motion by 30 degrees with elevation and by 15 degrees with external rotation in order to indicate improved shoulder function for overhead activities and for self-care activities    Baseline 90 degrees flexion range of motion , 85 degrees abduction range of motion, 25 degrees external rotation range of motion ( all AROM); 05/18/2021=R Shoulder AROM: Flex= 158 deg; ABD= 152; ER= 44    Time 8    Period Weeks    Status Achieved    Target Date 05/18/21      PT LONG TERM GOAL  #9   TITLE Patient will improve right shoulder passive range of motion by 30 degrees of elevation by 10 degrees with external rotation in order to indicate improved right shoulder range of motion for progressive functional activities    Baseline 120 degrees flexion, 123 degrees abduction, 50 degrees external rotation at 45 degrees abduction on 03/23/2021; 05/18/2021=R Shoulder PROM: Flex= 164 deg; ABD= 160; ER= 52; 7/26: Flex 150 deg, ABD 110 deg *pain limited when tested, ER 49  deg.; 8/9: 160 deg flex, 115 abduction, ER 60 deg.; 09/16/21: flex 166 deg, 125 abduction, ER 61 deg   Time 5   Period Weeks    Status Partially Met    Target Date 09/16/2021      PT LONG TERM GOAL  #10   TITLE Patient will improve right shoulder active range of motion by 15 degrees with elevation and by 10 degrees with external rotation in order to indicate improved shoulder function for overhead activities including reaching for 2nd and 3rd shelf in kitchen cabinet and  for self-care activities    Baseline 05/18/2021=R Shoulder AROM: Flex= 158 deg; ABD= 152; ER= 44 and Patient reports difficulty reaching overhead onto 2nd and 3rd shelf at home; 7/26: 145 deg flex, 143 deg abduction, 40 deg; 8/9: 155 Flex, 135 deg abduction, 45 deg ER; 8/30: flex 165, abd 130, ER 60   Time 5    Period Weeks    Status On-going    Target Date 09/16/2021              Plan -     Clinical Impression Statement Goals reassessed for discharge note. Pt with improved FOTO score (having previously met goal), indicating increased perceived functional mobility and QOL. Pt with modest and similar but overall improvements in PROM/AROM measurements, indicating plateau. Reviewed and confirmed understanding of HEP to maintain and continue gains beyond PT. Discussed d/c recommendations and instructed pt in seeking new referral should she feel she regresses in the future. Pt agreeable to plan and verbalized and demonstrated understanding for all. The pt does not require further skilled PT at this time and is to be discharged on this date.    Personal Factors and Comorbidities Age;Comorbidity 1;Comorbidity 2    Comorbidities arthritis, anxiety, HTN, hyperlipedemia.    Examination-Activity Limitations Carry;Lift;Locomotion Level    Examination-Participation Restrictions Cleaning;Yard Work;Meal Prep    Stability/Clinical Decision Making Stable/Uncomplicated    Rehab Potential Good    PT Frequency 1x / week    PT Duration 5 weeks    PT Treatment/Interventions ADLs/Self Care Home Management;Gait training;Therapeutic activities;Functional mobility training;Therapeutic exercise;Balance training;Neuromuscular re-education;Manual techniques;Energy conservation;Passive range of motion;Cryotherapy;Electrical Stimulation;Moist Heat;Patient/family education;Dry needling;Joint Manipulations    PT Next Visit Plan shoulder strenghtening & ROM exercises, continue POC   PT Home Exercise Plan 05/18/2021: Access Code:  [email or web address]  URL: [email or web address]  Access Code: [email or web address]  URL: [email or web address]; no updates    Consulted and Agree with Plan of Care Patient              Zollie Pee, PT 09/16/2021, 5:15 PM

## 2021-09-23 ENCOUNTER — Encounter: Payer: Self-pay | Admitting: Occupational Therapy

## 2021-09-23 ENCOUNTER — Ambulatory Visit: Payer: No Typology Code available for payment source

## 2021-09-23 ENCOUNTER — Ambulatory Visit: Payer: No Typology Code available for payment source | Attending: Neurology | Admitting: Occupational Therapy

## 2021-09-23 DIAGNOSIS — M6281 Muscle weakness (generalized): Secondary | ICD-10-CM | POA: Diagnosis present

## 2021-09-23 DIAGNOSIS — R278 Other lack of coordination: Secondary | ICD-10-CM | POA: Insufficient documentation

## 2021-09-23 NOTE — Therapy (Signed)
Occupational Therapy Progress Note  Dates of reporting period  08/17/2021   to   09/23/2021    Patient Name: Kim Gomez MRN: [id number] DOB:[birth year], 66 y.o., female Today's Date: 09/23/2021  PCP: Gladstone Lighter, MD REFERRING PROVIDER: Jennings Books, MD    OT End of Session - 09/23/21 1041     Visit Number 50    Number of Visits 14    Date for OT Re-Evaluation 09/30/21    Authorization Time Period Progress report period starting 08/17/2021    OT Start Time 1015    OT Stop Time 1100    OT Time Calculation (min) 45 min    Activity Tolerance Patient tolerated treatment well    Behavior During Therapy [facility] for tasks assessed/performed                       Past Medical History:  Diagnosis Date   Anxiety    Arthritis    legs, hands   Cellulitis of right knee    started on antibiotics 06/21/15.     Depression    Family history of adverse reaction to anesthesia    sister and mom -PONV   Hypercholesteremia    Hypertension    Past Surgical History:  Procedure Laterality Date   ABDOMINAL HYSTERECTOMY     COLONOSCOPY WITH PROPOFOL N/A 06/27/2015   Procedure: COLONOSCOPY WITH PROPOFOL;  Surgeon: Lucilla Lame, MD;  Location: [place];  Service: Endoscopy;  Laterality: N/A;   POLYPECTOMY  06/27/2015   Procedure: POLYPECTOMY;  Surgeon: Lucilla Lame, MD;  Location: [place];  Service: Endoscopy;;   TOTAL HIP ARTHROPLASTY Right 09/10/2019   Procedure: TOTAL HIP ARTHROPLASTY ANTERIOR APPROACH;  Surgeon: Lovell Sheehan, MD;  Location: [facility];  Service: Orthopedics;  Laterality: Right;   Patient Active Problem List   Diagnosis Date Noted   Acute stroke due to ischemia ([place]) 08/10/2020   History of total hip replacement, right 09/10/2019   Special screening for malignant neoplasms, colon    Benign neoplasm of ascending colon    Benign neoplasm of descending colon      REFERRING DIAG: CVA  THERAPY DIAG:  Muscle weakness (generalized)  Other  lack of coordination  Rationale for Evaluation and Treatment Rehabilitation  PERTINENT HISTORY: Pt. is a 66 y.o. female with PMH significant for HTN, HLD, anxiety disorder, recently hospitalized for COVID-19 infection (08/06/2020) at an outside facility. Patient was brought to the ED on 7/24 after she had a sudden onset of numbness involving right side of her body without any motor deficits, dysarthria, dysphagia. 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 Per chart for her f/u neuro visit:Left ischemic thalamic/internal capsule infarct presenting with right-sided numbness, weakness - now with residual cognitive fog (improving), imbalance (improving), fatigue, right first + second digit numbness, in patient with known vascular risk factors of hypertension, dyslipidemia, lack of physical activity, snoring  PRECAUTIONS: None  SUBJECTIVE:  Pt. reports having had a good weekend.  PAIN:  Are you having pain? No pain     OBJECTIVE:   OT Treatment    Pt. worked on using her right hand for grasping, and manipulating 1/2" washers from a magnetic dish using point grasp pattern. Pt. worked on reaching up, stabilizing, and sustaining shoulder elevation while placing the washer over a small precise target on higher vertical dowels positioned at various angles.Pt. performed gross gripping with a gross grip strengthener. Pt. worked on sustaining  grip while grasping pegs and reaching at various higher to various heights. The Gripper was set to 11.2# of grip strength resistance.    Pt. Has improved with RUE functioning. Pain ha  improved to 0/10 pain in the right shoulder. FOTO score has improved to 67. Pt. Has improved with RUE ROM, strength, hand strength, and [place] skills via the 9-hole peg test. Pt. Is now able to reach higher into closets, and cabinetry without pain. Pt. is able to push a shopping cart, open jars and containers, and vacuum. Pt.  presents with improved isolated right shoulder movements, and does not require cues to avoid compensation proximally with hiking of the shoulder. Pt. continues to work on improving RUE functioning, and review HEPs in preparation for discharge from OT services next week.        Measurements   Right   Shoulder flexion: 145 Shoulder Abduction: 125 Wrist extension 45  Strength:  Shoulder flexion: 4/5 Abduction 4/5 Elbow flexion/extension 5/5  Grip strength: 42# Pinch strength: 16# 3Pt.: 12#  [place], 9-hole peg test: 25 sec.  Right  09/23/2021  Shoulder flexion: 150 Shoulder Abduction: 136 Wrist extension: 54  Strength:  Shoulder flexion: 4+/5 Abduction 4+/5 Elbow flexion/extension 5/5  Grip strength: 40# Pinch strength: 16# 3Pt.: 12#  [place], 9-hole peg test: 23 sec.    PATIENT EDUCATION: Education details: RUE functioning Person educated: Patient Education method: Explanation, Demonstration, and Verbal cues Education comprehension: verbalized understanding, returned demonstration, tactile cues required, and needs further education   HOME EXERCISE PROGRAM Continue ongoing HEPs for the RUE     OT Long Term Goals - 07/08/21 1024       OT LONG TERM GOAL #1   Title Patient will be independent with home exercise program    Baseline 09/23/2021: Independent 08/17/2021: Independent 03/23/2021: Pt. is independent, 4/5: will continue to upgrade exercises5/03/2021: Independent 07/08/2021: Independent with HEPs    Time 12    Period Weeks    Status Met    Target Date 09/30/21      OT LONG TERM GOAL #4   Title Patient will increase right grip strength by 10 pounds to be able to open jars and containers with modified independence    Baseline 28 pounds at eval and difficulty with managing jars and containers. 10th visit: R: 34# Jars, and containers continue to be difficulty to open.  4/5: right grip 50# 05/20/2021: right grip strength 40# 07/08/2021: R: 49# 08/17/2021: 42#  09/23/2021: 40# pt. Is now able to open jars, and containers   Time 12    Period Weeks    Status Met   Target Date 09/30/21      OT LONG TERM GOAL #6   Title Patient will decrease right shoulder pain to 2 out of 10 or less with movement patterns for daily activities.    Baseline Increased pain in right shoulder, 5 out of 10 with movement. 10th visit: 8/10 right shoulder pain, 4/5:  2/10 with movement today but has increased pain other days and not yet consistent.5/03: 0/10 during movement today. 07/08/2021: 0/10 pain in the right shoulder 08/17/2021: 1/10, 09/23/2021: 0/10   Time 12    Period Weeks    Status Met    Target Date 09/30/21      OT LONG TERM GOAL #8   Title Will assess for potential return to work tasks as patient progresses with therapy    Baseline Patient currently unable to perform her job as outlined in her job description  Time 12    Period Weeks    Status Deferred      OT LONG TERM GOAL  #9   TITLE Pt will improve strength to be able to reach up to place items into closets, and cabinets independently    Baseline 09/23/2021:Pt. Has improved, however continues to require increased time. 08/17/2021: Pt. Is a able to reach up, however is not efficient, takes increased time, and is unable to grasp weighted objects from shelves. 07/08/2021:pt. has difficulty reaching up to place items in closets, and cabinets. 05/20/2021: Pt. continues to have difficulty 4/5: unable    Time 12    Period Weeks    Status Revised    Target Date 09/30/21      OT LONG TERM GOAL  #10   TITLE When on the passenger side, pt will be able to close door with modified independence    Baseline 07/08/2021: Met 4/5:  difficulty at times    Time 6    Period Weeks    Status Achieved      OT LONG TERM GOAL  #11   TITLE Pt will demonstrate ability to vacuum carpet of 2 rooms with use of right UE and no rest breaks    Baseline 08/17/2021: Independent, and efficient. 07/08/2021: Independent without rest breaks.  4/5:  difficulty with using vacuum. 5/03: Pt. has progressed and is now able to vacuum multiple rooms in her home    Time 6    Period Weeks    Status Achieved   Target Date 09/30/21      OT LONG TERM GOAL  #13   TITLE Pt will improve strength to be able to push a full grocery cart   Baseline 09/23/2021: Pt reports this has improved. 08/17/2021: Pt. Has difficulty pushing a full size grocery cart. 07/08/2021: Pt. continues to have difficulty picking up a bag of fertilizer. 4/5: unable    Time 12    Period Weeks    Status revised   Target Date 07/15/21                Plan - 07/27/21 1258     Clinical Impression Statement Pt. reports that her arm feels better today overall. Reporting 1/10 pain in the right shoulder.  Pt. presented with improved isolated right shoulder movements today, however does require cues to avoid compensation proximally with hiking of the shoulder. Pt. was able to grasp, store, and perform translatory movements with 3, and 4 pegs with increased time to complete. Pt. Tolerated the reaching tasks well initially, however fatigued as the task progressed while manipulating the top rows of pegs, and when challenged with adding speed to the coordination task. Pt. reported numbness in the right 2nd, 3rd digits, and thumb. Pt. continues to work on improving RUE strength, and right functional hand use to improve reaching during ADLs, and IADL tasks.              OT Occupational Profile and History Problem Focused Assessment - Including review of records relating to presenting problem    Body Structure / Function / Physical Skills ADL;Dexterity;ROM;Strength;Coordination;FMC;IADL;Pain;Sensation;UE functional use;Decreased knowledge of use of DME    Cognitive Skills Memory;Safety Awareness    Psychosocial Skills Environmental  Adaptations;Habits;Routines and Behaviors    Rehab Potential Good    Clinical Decision Making Several treatment options, min-mod task modification  necessary    Comorbidities Affecting Occupational Performance: May have comorbidities impacting occupational performance    Modification or Assistance to Complete Evaluation  No modification of tasks or assist necessary to complete eval    OT Frequency 2x / week    OT Duration 12 weeks    OT Treatment/Interventions Self-care/ADL training;Cryotherapy;Paraffin;Therapeutic exercise;DME and/or AE instruction;Cognitive remediation/compensation;Neuromuscular education;Manual Therapy;Moist Heat;Contrast Bath;Therapeutic activities;Patient/family education    Consulted and Agree with Plan of Care Patient              Harrel Carina, MS, OTR/L   Harrel Carina, OT 09/23/2021, 10:46 AM

## 2021-09-28 ENCOUNTER — Encounter: Payer: Self-pay | Admitting: Occupational Therapy

## 2021-09-28 ENCOUNTER — Ambulatory Visit: Payer: No Typology Code available for payment source | Admitting: Occupational Therapy

## 2021-09-28 DIAGNOSIS — M6281 Muscle weakness (generalized): Secondary | ICD-10-CM

## 2021-09-28 DIAGNOSIS — R278 Other lack of coordination: Secondary | ICD-10-CM

## 2021-09-28 NOTE — Therapy (Addendum)
OT TREATMENT NOTE   Patient Name: Kim Gomez MRN: [id number] DOB:January 06, 1956, 66 y.o., female Today's Date: 09/28/2021  PCP: Gladstone Lighter, MD REFERRING PROVIDER: Jennings Books, MD    OT End of Session - 09/28/21 0939     Visit Number 51    Number of Visits 60    Date for OT Re-Evaluation 09/30/21    Authorization Time Period Progress report period starting 08/17/2021    OT Start Time 0915    OT Stop Time 1000    OT Time Calculation (min) 45 min    Activity Tolerance Patient tolerated treatment well    Behavior During Therapy [facility] for tasks assessed/performed                       Past Medical History:  Diagnosis Date   Anxiety    Arthritis    legs, hands   Cellulitis of right knee    started on antibiotics 06/21/15.     Depression    Family history of adverse reaction to anesthesia    sister and mom -PONV   Hypercholesteremia    Hypertension    Past Surgical History:  Procedure Laterality Date   ABDOMINAL HYSTERECTOMY     COLONOSCOPY WITH PROPOFOL N/A 06/27/2015   Procedure: COLONOSCOPY WITH PROPOFOL;  Surgeon: Lucilla Lame, MD;  Location: [place];  Service: Endoscopy;  Laterality: N/A;   POLYPECTOMY  06/27/2015   Procedure: POLYPECTOMY;  Surgeon: Lucilla Lame, MD;  Location: [place];  Service: Endoscopy;;   TOTAL HIP ARTHROPLASTY Right 09/10/2019   Procedure: TOTAL HIP ARTHROPLASTY ANTERIOR APPROACH;  Surgeon: Lovell Sheehan, MD;  Location: [facility];  Service: Orthopedics;  Laterality: Right;   Patient Active Problem List   Diagnosis Date Noted   Acute stroke due to ischemia ([place]) 08/10/2020   History of total hip replacement, right 09/10/2019   Special screening for malignant neoplasms, colon    Benign neoplasm of ascending colon    Benign neoplasm of descending colon      REFERRING DIAG: CVA  THERAPY DIAG:  Muscle weakness (generalized)  Rationale for Evaluation and Treatment Rehabilitation  PERTINENT HISTORY: Pt. is  a 66 y.o. female with PMH significant for HTN, HLD, anxiety disorder, recently hospitalized for COVID-19 infection (08/06/2020) at an outside facility. Patient was brought to the ED on 7/24 after she had a sudden onset of numbness involving right side of her body without any motor deficits, dysarthria, dysphagia. 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 Per chart for her f/u neuro visit:Left ischemic thalamic/internal capsule infarct presenting with right-sided numbness, weakness - now with residual cognitive fog (improving), imbalance (improving), fatigue, right first + second digit numbness, in patient with known vascular risk factors of hypertension, dyslipidemia, lack of physical activity, snoring  PRECAUTIONS: None  SUBJECTIVE:  Pt. reports No pain today  PAIN:  Are you having pain? No pain     OBJECTIVE:   OT Treatment    Pt. performed gross gripping with a gross grip strengthener. Pt. worked on sustaining grip while grasping pegs and reaching at various heights. The Gripper was set to 17.9# of grip strength resistance. Pt. worked on using her right hand for grasping 1", 3/4", and 1/2" washers from a magnetic dish, and positioning them onto magnetic hooks on a whiteboard positioned at an elevated vertical angle on the tabletop. Pt. Worked on [facility] skills manipulating nuts, and bolts on a bolt board. Pt.  Worked on screwing, and unscrewing nuts, and bolts of varying sizes, and challenging progressively smaller item with vision occluded.  Pt. Education was provided about a HEP for [facility] skills. A visual handout was provided.   Pt. has made excellent progress overall with Right UE functioning. Pt. pain has progressed to 0/10. Pt. has progressed with functional reaching, as well as sustaining her shoulder in elevation while reaching with manipulation of objects. Pt. requires verbal cues, and cues for visual demonstration of the proper  technique. Pt. Education was provided about a HEP for continued [facility] skills at home. Plan to review HEPs with the Pt., and assess further needs in preparation for discharge next session.    Measurements   Right   Shoulder flexion: 145 Shoulder Abduction: 125 Wrist extension 45  Strength:  Shoulder flexion: 4/5 Abduction 4/5 Elbow flexion/extension 5/5  Grip strength: 42# Pinch strength: 16# 3Pt.: 12#  [place], 9-hole peg test: 25 sec.  Right  09/23/2021  Shoulder flexion: 150 Shoulder Abduction: 136 Wrist extension: 54  Strength:  Shoulder flexion: 4+/5 Abduction 4+/5 Elbow flexion/extension 5/5  Grip strength: 40# Pinch strength: 16# 3Pt.: 12#  [place], 9-hole peg test: 23 sec.    PATIENT EDUCATION: Education details: RUE functioning Person educated: Patient Education method: Explanation, Demonstration, and Verbal cues Education comprehension: verbalized understanding, returned demonstration, tactile cues required, and needs further education   HOME EXERCISE PROGRAM Continue ongoing HEPs for the RUE     OT Long Term Goals - 07/08/21 1024       OT LONG TERM GOAL #1   Title Patient will be independent with home exercise program    Baseline 09/23/2021: Independent 08/17/2021: Independent 03/23/2021: Pt. is independent, 4/5: will continue to upgrade exercises5/03/2021: Independent 07/08/2021: Independent with HEPs    Time 12    Period Weeks    Status Met    Target Date 09/30/21      OT LONG TERM GOAL #4   Title Patient will increase right grip strength by 10 pounds to be able to open jars and containers with modified independence    Baseline 28 pounds at eval and difficulty with managing jars and containers. 10th visit: R: 34# Jars, and containers continue to be difficulty to open.  4/5: right grip 50# 05/20/2021: right grip strength 40# 07/08/2021: R: 49# 08/17/2021: 42# 09/23/2021: 40# pt. Is now able to open jars, and containers   Time 12    Period Weeks    Status  Met   Target Date 09/30/21      OT LONG TERM GOAL #6   Title Patient will decrease right shoulder pain to 2 out of 10 or less with movement patterns for daily activities.    Baseline Increased pain in right shoulder, 5 out of 10 with movement. 10th visit: 8/10 right shoulder pain, 4/5:  2/10 with movement today but has increased pain other days and not yet consistent.5/03: 0/10 during movement today. 07/08/2021: 0/10 pain in the right shoulder 08/17/2021: 1/10, 09/23/2021: 0/10   Time 12    Period Weeks    Status Met    Target Date 09/30/21      OT LONG TERM GOAL #8   Title Will assess for potential return to work tasks as patient progresses with therapy    Baseline Patient currently unable to perform her job as outlined in her job description    Time 12    Period Weeks    Status Deferred      OT [place]  GOAL  #9   TITLE Pt will improve strength to be able to reach up to place items into closets, and cabinets independently    Baseline 09/23/2021:Pt. Has improved, however continues to require increased time. 08/17/2021: Pt. Is a able to reach up, however is not efficient, takes increased time, and is unable to grasp weighted objects from shelves. 07/08/2021:pt. has difficulty reaching up to place items in closets, and cabinets. 05/20/2021: Pt. continues to have difficulty 4/5: unable    Time 12    Period Weeks    Status Revised    Target Date 09/30/21      OT LONG TERM GOAL  #10   TITLE When on the passenger side, pt will be able to close door with modified independence    Baseline 07/08/2021: Met 4/5:  difficulty at times    Time 6    Period Weeks    Status Achieved      OT LONG TERM GOAL  #11   TITLE Pt will demonstrate ability to vacuum carpet of 2 rooms with use of right UE and no rest breaks    Baseline 08/17/2021: Independent, and efficient. 07/08/2021: Independent without rest breaks. 4/5:  difficulty with using vacuum. 5/03: Pt. has progressed and is now able to vacuum multiple  rooms in her home    Time 6    Period Weeks    Status Achieved   Target Date 09/30/21      OT LONG TERM GOAL  #13   TITLE Pt will improve strength to be able to push a full grocery cart   Baseline 09/23/2021: Pt reports this has improved. 08/17/2021: Pt. Has difficulty pushing a full size grocery cart. 07/08/2021: Pt. continues to have difficulty picking up a bag of fertilizer. 4/5: unable    Time 12    Period Weeks    Status revised   Target Date 07/15/21                Plan - 07/27/21 1258     Clinical Impression Statement Pt. has made excellent progress overall with Right UE functioning. Pt. pain has progressed to 0/10. Pt. has progressed with functional reaching, as well as sustaining her shoulder in elevation while reaching with manipulation of objects. Pt. requires verbal cues, and cues for visual demonstration of the proper technique. Pt. Education was provided about a HEP for continued [facility] skills at home. Plan to review HEPs with the Pt., and assess further needs in preparation for discharge next session.              OT Occupational Profile and History Problem Focused Assessment - Including review of records relating to presenting problem    Body Structure / Function / Physical Skills ADL;Dexterity;ROM;Strength;Coordination;FMC;IADL;Pain;Sensation;UE functional use;Decreased knowledge of use of DME    Cognitive Skills Memory;Safety Awareness    Psychosocial Skills Environmental  Adaptations;Habits;Routines and Behaviors    Rehab Potential Good    Clinical Decision Making Several treatment options, min-mod task modification necessary    Comorbidities Affecting Occupational Performance: May have comorbidities impacting occupational performance    Modification or Assistance to Complete Evaluation  No modification of tasks or assist necessary to complete eval    OT Frequency 2x / week    OT Duration 12 weeks    OT Treatment/Interventions Self-care/ADL  training;Cryotherapy;Paraffin;Therapeutic exercise;DME and/or AE instruction;Cognitive remediation/compensation;Neuromuscular education;Manual Therapy;Moist Heat;Contrast Bath;Therapeutic activities;Patient/family education    Consulted and Agree with Plan of Care Patient  Harrel Carina, MS, OTR/L   Harrel Carina, OT 09/28/2021, 9:45 AM

## 2021-09-30 ENCOUNTER — Ambulatory Visit: Payer: No Typology Code available for payment source

## 2021-09-30 ENCOUNTER — Ambulatory Visit: Payer: No Typology Code available for payment source | Admitting: Occupational Therapy

## 2021-09-30 DIAGNOSIS — R278 Other lack of coordination: Secondary | ICD-10-CM

## 2021-09-30 DIAGNOSIS — M6281 Muscle weakness (generalized): Secondary | ICD-10-CM

## 2021-10-01 NOTE — Progress Notes (Signed)
[facility] Summary Report / Loop Recorder 

## 2021-10-05 ENCOUNTER — Ambulatory Visit: Payer: No Typology Code available for payment source | Admitting: Occupational Therapy

## 2021-10-05 ENCOUNTER — Ambulatory Visit (INDEPENDENT_AMBULATORY_CARE_PROVIDER_SITE_OTHER): Payer: No Typology Code available for payment source

## 2021-10-05 DIAGNOSIS — I639 Cerebral infarction, unspecified: Secondary | ICD-10-CM | POA: Diagnosis not present

## 2021-10-06 ENCOUNTER — Encounter: Payer: Self-pay | Admitting: Occupational Therapy

## 2021-10-06 NOTE — Therapy (Signed)
OT TREATMENT NOTE/DISCHARGE SUMMARY    Patient Name: ABRIA VANNOSTRAND MRN: [id number] DOB:07-Sep-1955, 66 y.o., female Today's Date: 09/30/2021  PCP: Gladstone Lighter, MD REFERRING PROVIDER: Jennings Books, MD    OT End of Session    Visit Number 52    Number of Visits 72    Date for OT Re-Evaluation 09/30/21    Authorization Time Period Progress report period starting 08/17/2021    OT Start Time 0930    OT Stop Time 1015    OT Time Calculation (min) 45 min    Activity Tolerance Patient tolerated treatment well    Behavior During Therapy [facility] for tasks assessed/performed                       Past Medical History:  Diagnosis Date   Anxiety    Arthritis    legs, hands   Cellulitis of right knee    started on antibiotics 06/21/15.     Depression    Family history of adverse reaction to anesthesia    sister and mom -PONV   Hypercholesteremia    Hypertension    Past Surgical History:  Procedure Laterality Date   ABDOMINAL HYSTERECTOMY     COLONOSCOPY WITH PROPOFOL N/A 06/27/2015   Procedure: COLONOSCOPY WITH PROPOFOL;  Surgeon: Lucilla Lame, MD;  Location: [place];  Service: Endoscopy;  Laterality: N/A;   POLYPECTOMY  06/27/2015   Procedure: POLYPECTOMY;  Surgeon: Lucilla Lame, MD;  Location: [place];  Service: Endoscopy;;   TOTAL HIP ARTHROPLASTY Right 09/10/2019   Procedure: TOTAL HIP ARTHROPLASTY ANTERIOR APPROACH;  Surgeon: Lovell Sheehan, MD;  Location: [facility];  Service: Orthopedics;  Laterality: Right;   Patient Active Problem List   Diagnosis Date Noted   Acute stroke due to ischemia ([place]) 08/10/2020   History of total hip replacement, right 09/10/2019   Special screening for malignant neoplasms, colon    Benign neoplasm of ascending colon    Benign neoplasm of descending colon      REFERRING DIAG: CVA  THERAPY DIAG:  Muscle weakness (generalized)  Other lack of coordination  Rationale for Evaluation and Treatment  Rehabilitation  PERTINENT HISTORY: Pt. is a 66 y.o. female with PMH significant for HTN, HLD, anxiety disorder, recently hospitalized for COVID-19 infection (08/06/2020) at an outside facility. Patient was brought to the ED on 7/24 after she had a sudden onset of numbness involving right side of her body without any motor deficits, dysarthria, dysphagia. 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 Per chart for her f/u neuro visit:Left ischemic thalamic/internal capsule infarct presenting with right-sided numbness, weakness - now with residual cognitive fog (improving), imbalance (improving), fatigue, right first + second digit numbness, in patient with known vascular risk factors of hypertension, dyslipidemia, lack of physical activity, snoring  PRECAUTIONS: None  SUBJECTIVE:    PAIN:  Are you having pain? No pain     OBJECTIVE:   OT Treatment: Measurements taken this date for discharge, see below for details, administered FOTO with today's score 64.   Pt denies any pain in shoulder this date.  She has been going to the gym 3 days a week and will plan to increase to 5 days a week at discharge.   Neuromuscular reeducation:  Review of HEP for ROM, strength and coordination skills with all questions answered during session.   Pt seen for manipulation of ball pegs on 45 degree wedge, placing one at  a time onto elevated surface to encourage reach and ROM, removing pegs one at a time and moving to palm using translatory skills of the hand and using the hand for storage with up to 4 pegs in her hand at a time.   Pt reports she is still slow at typing to formulate emails to family and friends.  Administered a typing test with results of 9 WPM. Instructed on online resources she can utilize at home as a part of her home program to redevelop these typing skills and also have a measure of her progress over time.   Manipulation of small snap  beads to string together and remove for [facility] and emphasis on finger strength. Pt is able to demonstrate understanding of HEP and ways to set up tasks to encourage ROM, reaching, strengthening and tasks to promote fine motor coordination skills.        Measurements    Right UE 09/30/2021  Shoulder flexion: 150 Shoulder Abduction: 136 Wrist extension: 54  Strength:  Shoulder flexion: 4+/5 Abduction 4+/5 Elbow flexion/extension 5/5  Grip strength: 40# Pinch strength: 16# 3Pt.: 12#  [place], 9-hole peg test: 23 sec.    PATIENT EDUCATION: Education details: RUE functioning Person educated: Patient Education method: Explanation, Demonstration, and Verbal cues Education comprehension: verbalized understanding, returned demonstration, tactile cues required, and needs further education   HOME EXERCISE PROGRAM Continue ongoing HEPs for the RUE     OT Long Term Goals - 07/08/21 1024       OT LONG TERM GOAL #1   Title Patient will be independent with home exercise program    Baseline 09/23/2021: Independent 08/17/2021: Independent 03/23/2021: Pt. is independent, 4/5: will continue to upgrade exercises5/03/2021: Independent 07/08/2021: Independent with HEPs    Time 12    Period Weeks    Status Met    Target Date 09/30/21      OT LONG TERM GOAL #4   Title Patient will increase right grip strength by 10 pounds to be able to open jars and containers with modified independence    Baseline 28 pounds at eval and difficulty with managing jars and containers. 10th visit: R: 34# Jars, and containers continue to be difficulty to open.  4/5: right grip 50# 05/20/2021: right grip strength 40# 07/08/2021: R: 49# 08/17/2021: 42# 09/23/2021: 40# pt. Is now able to open jars, and containers   Time 12    Period Weeks    Status Met   Target Date 09/30/21      OT LONG TERM GOAL #6   Title Patient will decrease right shoulder pain to 2 out of 10 or less with movement patterns for daily activities.     Baseline Increased pain in right shoulder, 5 out of 10 with movement. 10th visit: 8/10 right shoulder pain, 4/5:  2/10 with movement today but has increased pain other days and not yet consistent.5/03: 0/10 during movement today. 07/08/2021: 0/10 pain in the right shoulder 08/17/2021: 1/10, 09/23/2021: 0/10   Time 12    Period Weeks    Status Met    Target Date 09/30/21      OT LONG TERM GOAL #8   Title Will assess for potential return to work tasks as patient progresses with therapy    Baseline Patient currently unable to perform her job as outlined in her job description    Time 12    Period Weeks    Status Deferred      OT [place]  #9  TITLE Pt will improve strength to be able to reach up to place items into closets, and cabinets independently    Baseline 09/23/2021:Pt. Has improved, however continues to require increased time. 08/17/2021: Pt. Is a able to reach up, however is not efficient, takes increased time, and is unable to grasp weighted objects from shelves. 07/08/2021:pt. has difficulty reaching up to place items in closets, and cabinets. 05/20/2021: Pt. continues to have difficulty 4/5: unable    Time 12    Period Weeks    Status   ACHIEVED   Target Date 09/30/21      OT LONG TERM GOAL  #10   TITLE When on the passenger side, pt will be able to close door with modified independence    Baseline 07/08/2021: Met 4/5:  difficulty at times    Time 6    Period Weeks    Status Achieved      OT LONG TERM GOAL  #11   TITLE Pt will demonstrate ability to vacuum carpet of 2 rooms with use of right UE and no rest breaks    Baseline 08/17/2021: Independent, and efficient. 07/08/2021: Independent without rest breaks. 4/5:  difficulty with using vacuum. 5/03: Pt. has progressed and is now able to vacuum multiple rooms in her home    Time 6    Period Weeks    Status Achieved   Target Date 09/30/21      OT LONG TERM GOAL  #13   TITLE Pt will improve strength to be able to push a full  grocery cart   Baseline 09/23/2021: Pt reports this has improved. 08/17/2021: Pt. Has difficulty pushing a full size grocery cart. 07/08/2021: Pt. continues to have difficulty picking up a bag of fertilizer. 4/5: unable    Time 12    Period Weeks    Status Achieved    Target Date 07/15/21                Plan     Clinical Impression Statement Pt has continued to make excellent progress in all areas.  She demonstrates improved strength, ROM, coordination and pain now 0/10 at rest and with movement.  Pt has met her goals and is independent with her home program.  She will continue with daily exercises and plans to go to the gym 3-5 days a week.  Added typing exercises and tests for home program to further redevelop speed and dexterity in this area.  Will discharge at this time with goals met, no further OT indicated presently.    OT Occupational Profile and History Problem Focused Assessment - Including review of records relating to presenting problem    Body Structure / Function / Physical Skills ADL;Dexterity;ROM;Strength;Coordination;FMC;IADL;Pain;Sensation;UE functional use;Decreased knowledge of use of DME    Cognitive Skills Memory;Safety Awareness    Psychosocial Skills Environmental  Adaptations;Habits;Routines and Behaviors    Rehab Potential Good    Clinical Decision Making Several treatment options, min-mod task modification necessary    Comorbidities Affecting Occupational Performance: May have comorbidities impacting occupational performance    Modification or Assistance to Complete Evaluation  No modification of tasks or assist necessary to complete eval    OT Frequency 2x / week    OT Duration 12 weeks    OT Treatment/Interventions Self-care/ADL training;Cryotherapy;Paraffin;Therapeutic exercise;DME and/or AE instruction;Cognitive remediation/compensation;Neuromuscular education;Manual Therapy;Moist Heat;Contrast Bath;Therapeutic activities;Patient/family education    Consulted  and Agree with Plan of Care Patient             Aranza Geddes T  Liliah Dorian, OTR/L, CLT   Josemaria Brining, OT 10/06/2021, 2:25 PM

## 2021-10-07 ENCOUNTER — Ambulatory Visit: Payer: No Typology Code available for payment source

## 2021-10-07 ENCOUNTER — Ambulatory Visit: Payer: No Typology Code available for payment source | Admitting: Occupational Therapy

## 2021-10-12 ENCOUNTER — Ambulatory Visit: Payer: No Typology Code available for payment source | Admitting: Occupational Therapy

## 2021-10-12 ENCOUNTER — Ambulatory Visit (INDEPENDENT_AMBULATORY_CARE_PROVIDER_SITE_OTHER): Payer: No Typology Code available for payment source

## 2021-10-12 DIAGNOSIS — I639 Cerebral infarction, unspecified: Secondary | ICD-10-CM

## 2021-10-13 LAB — CUP PACEART REMOTE DEVICE CHECK
Date Time Interrogation Session: 20230925084530
Implantable Pulse Generator Implant Date: 20230405
Pulse Gen Serial Number: 178997

## 2021-10-14 ENCOUNTER — Ambulatory Visit: Payer: No Typology Code available for payment source | Admitting: Occupational Therapy

## 2021-10-14 ENCOUNTER — Ambulatory Visit: Payer: No Typology Code available for payment source

## 2021-10-19 ENCOUNTER — Ambulatory Visit: Payer: No Typology Code available for payment source | Admitting: Occupational Therapy

## 2021-10-19 NOTE — Progress Notes (Signed)
[place] loop recorder 

## 2021-10-21 ENCOUNTER — Ambulatory Visit: Payer: No Typology Code available for payment source | Admitting: Occupational Therapy

## 2021-10-21 ENCOUNTER — Ambulatory Visit: Payer: No Typology Code available for payment source

## 2021-10-22 IMAGING — MR MR HEAD W/O CM
14 series · 40 of 48 positions shown · non-contrast
Comparison: No pertinent prior exam.

CLINICAL DATA: Dizziness, persistent/recurrent, cardiac or vascular
cause suspected. Neck pain and headaches. Recently diagnosed with
[id number].

EXAM:
MRI HEAD WITHOUT CONTRAST
MRA HEAD WITHOUT CONTRAST
TECHNIQUE: Multiplanar, multi-echo pulse sequences of the brain and surrounding
structures were acquired without intravenous contrast. Angiographic
images of the Circle of Willis were acquired using MRA technique
without intravenous contrast.

[Series 5: ax dwi_tracew · axial · 3.0mm · 0.65mm/px · z∈[-85,+67]mm · 5 of 95 slices shown]
[im 1/95]
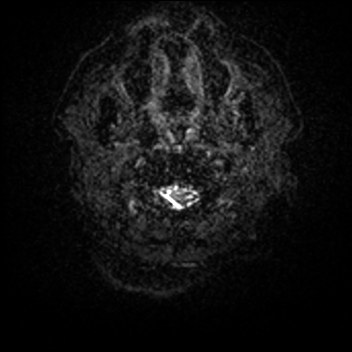
[im 24/95]
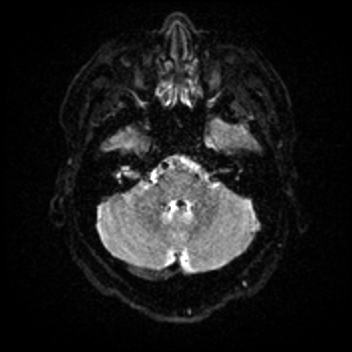
[im 48/95]
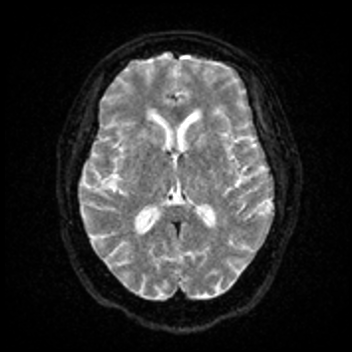
[im 71/95]
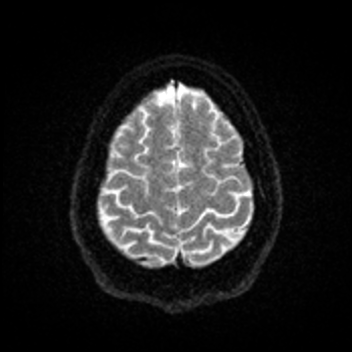
[im 95/95]
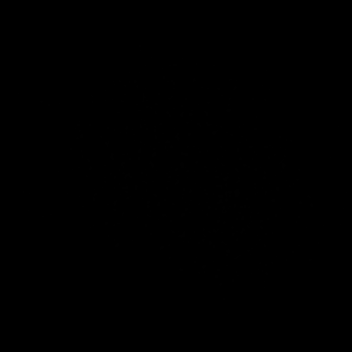

[Series 6: ax dwi_adc · axial · 3.0mm · 0.65mm/px · z∈[-85,+64]mm · 2 of 47 slices shown]
[im 1/47]
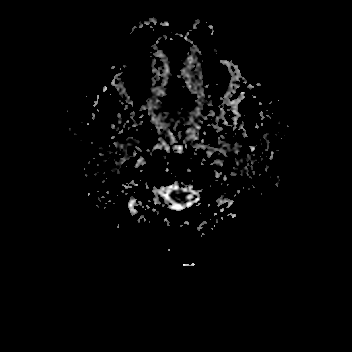
[im 47/47]
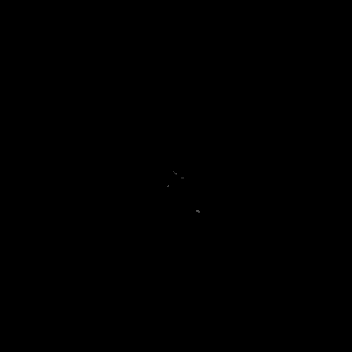

[Series 7: cor dwi_tracew · coronal · 5.0mm · 0.68mm/px · 2 of 40 slices shown]
[im 1/40]
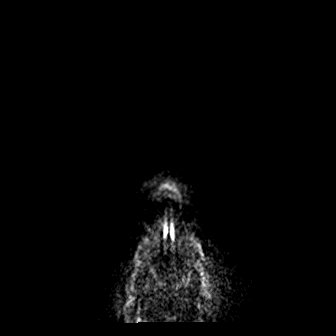
[im 40/40]
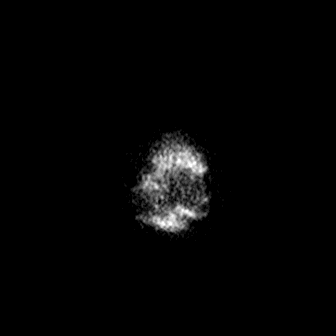

[Series 8: cor dwi_adc · coronal · 5.0mm · 0.68mm/px · 2 of 40 slices shown]
[im 1/40]
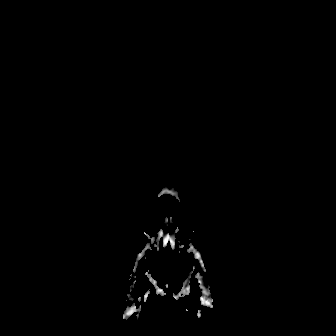
[im 40/40]
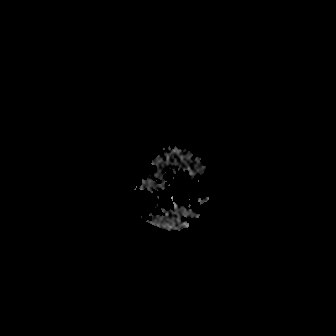

[Series 9: T1 · sagittal · 5.0mm · 0.62mm/px · 1 of 25 slices shown (1 of 2)]
[im 1/25]
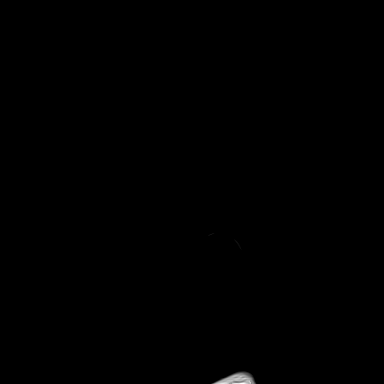

[Series 10: T2 · axial · 5.0mm · 0.53mm/px · 1 of 27 slices shown (1 of 2)]
[im 1/27]
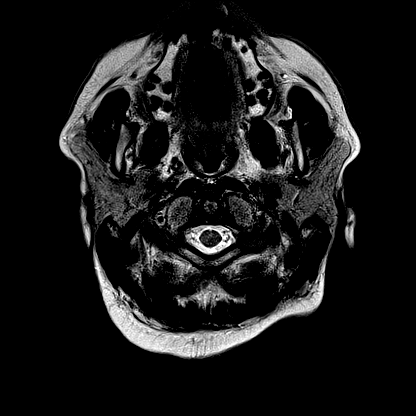

[Series 11: mag_images · axial · 3.0mm · 0.90mm/px · z∈[-94,+80]mm · 3 of 60 slices shown]
[im 1/60]
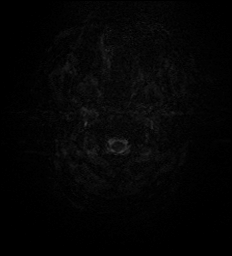
[im 30/60]
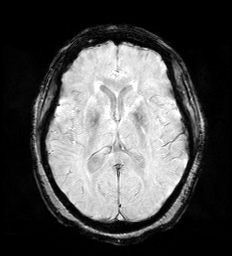
[im 60/60]
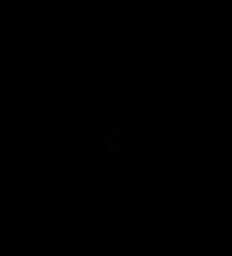

[Series 12: pha_images · axial · 3.0mm · 0.90mm/px · z∈[-94,+80]mm · 3 of 59 slices shown]
[im 1/59]
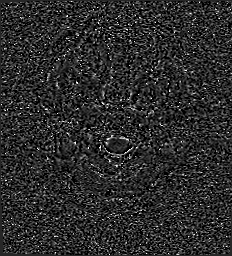
[im 30/59]
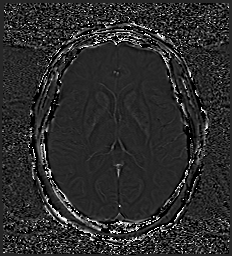
[im 59/59]
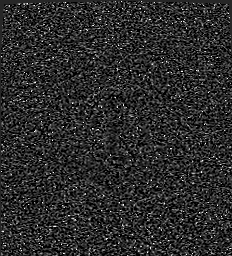

[Series 13: swi_images · axial · 3.0mm · 0.90mm/px · z∈[-94,+80]mm · 3 of 60 slices shown]
[im 1/60]
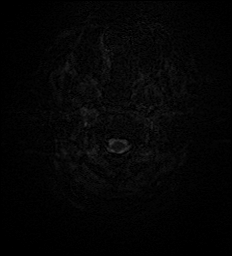
[im 30/60]
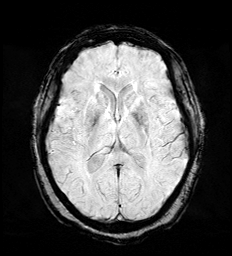
[im 60/60]
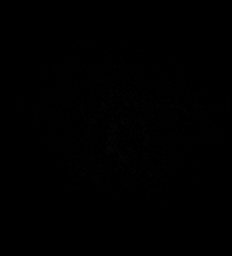

[Series 14: mip_images(sw) · axial · 24.0mm · 0.90mm/px · z∈[-84,+70]mm · 3 of 53 slices shown]
[im 1/53]
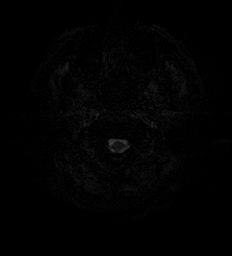
[im 27/53]
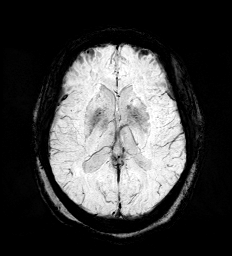
[im 53/53]
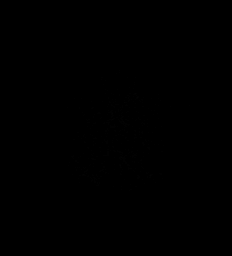

[Series 15: FLAIR · axial · 3.0mm · 0.53mm/px · z∈[-85,+74]mm · 3 of 55 slices shown]
[im 1/55]
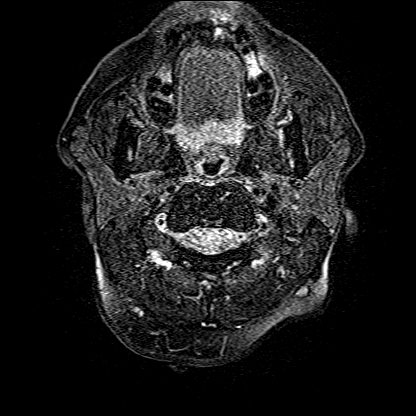
[im 28/55]
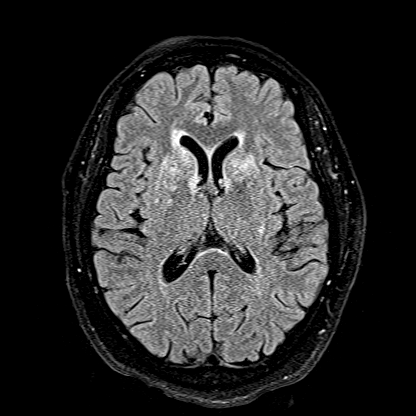
[im 55/55]
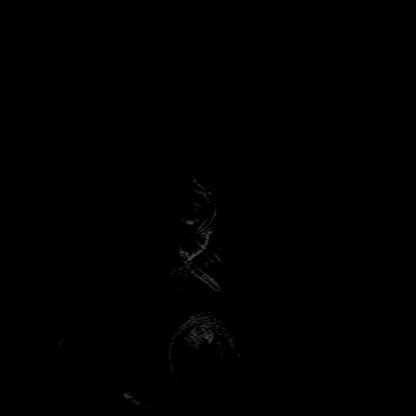

[Series 16: T1 · axial · 1.0mm · 0.98mm/px · z∈[-89,+67]mm · 8 of 160 slices shown (2 of 2)]
[im 1/160]
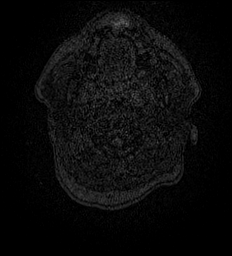
[im 23/160]
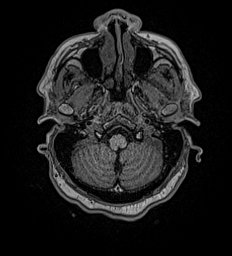
[im 46/160]
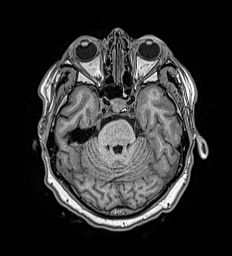
[im 69/160]
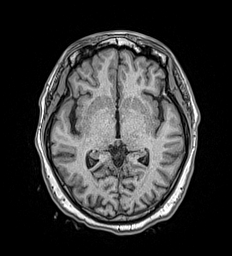
[im 91/160]
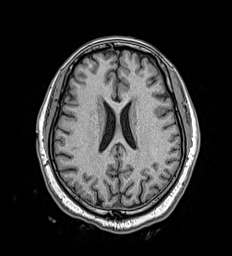
[im 114/160]
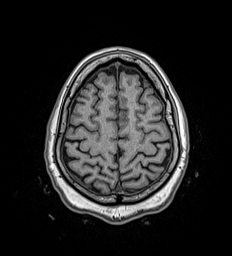
[im 137/160]
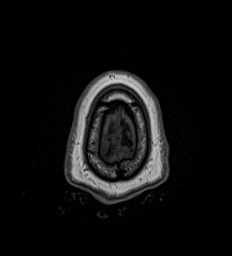
[im 160/160]
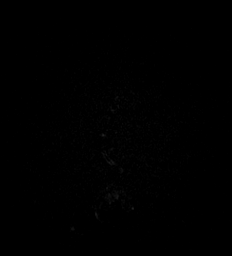

[Series 17: T2 · coronal · 5.0mm · 0.45mm/px · 2 of 31 slices shown (2 of 2)]
[im 1/31]
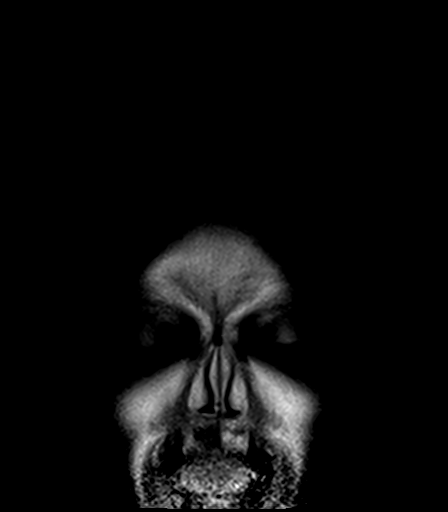
[im 31/31]
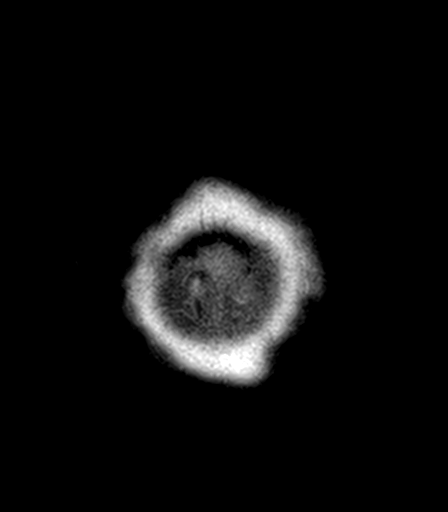

[Series 18: TOF · axial · 0.5mm · 0.41mm/px · z∈[-80,-69]mm · 2 of 205 slices shown]
[im 1/205]
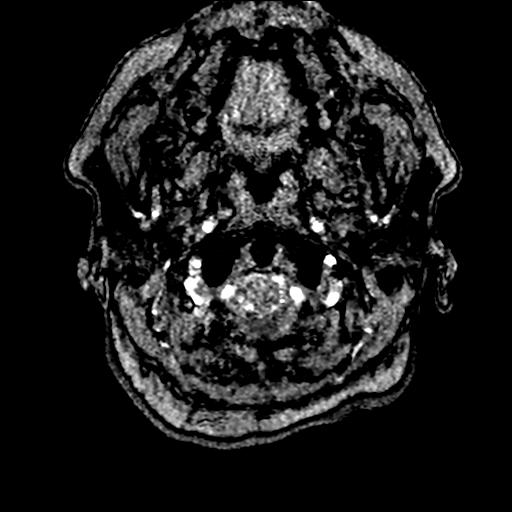
[im 23/205]
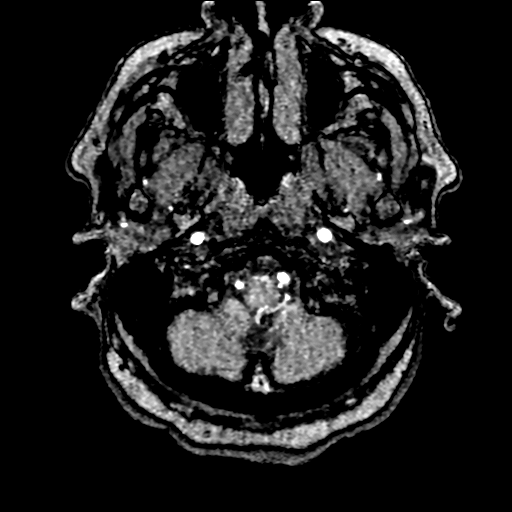

[40 of 48 positions shown; findings below may reference images not displayed]

FINDINGS: MRI HEAD FINDINGS

Brain: There is a 1 cm acute infarct in the lateral aspect of the
left thalamus/posterior limb of internal capsule. No intracranial
hemorrhage, midline shift, or extra-axial fluid collection is
identified. Patchy T2 hyperintensities in the deep cerebral white
matter and and deep gray nuclei bilaterally are nonspecific but
compatible with moderately age advanced chronic small vessel
ischemic disease. The ventricles and sulci are normal. There is
asymmetric enlargement of the pituitary gland on the right with a
height of 7 mm and convex margin without regional mass effect.

Vascular: Major intracranial vascular flow voids are preserved.

Skull and upper cervical spine: Unremarkable bone marrow signal.

Sinuses/Orbits: Unremarkable orbits. Minimal mucosal thickening in
the paranasal sinuses. Clear mastoid air cells.

Other: None.

MRA HEAD FINDINGS

Anterior circulation: The internal carotid arteries are widely
patent from skull base to carotid termini. ACAs and MCAs are patent
with mild branch vessel irregularity but no evidence of a proximal
branch occlusion or flow limiting proximal stenosis. No aneurysm is
identified.

Posterior circulation: The included portions of the distal vertebral
arteries are widely patent to the basilar with the left being mildly
dominant. The basilar artery is widely patent. Posterior
communicating arteries are diminutive or absent. The PCAs are patent
with mild multifocal irregular narrowing bilaterally but no
high-grade proximal stenosis. No aneurysm is identified.

Anatomic variants: None.
IMPRESSION: 1. Acute left thalamic/internal capsule infarct.
2. Moderate chronic small vessel ischemic disease.
3. No major intracranial arterial occlusion or flow limiting
proximal stenosis.
4. Mild asymmetric enlargement of the pituitary gland on the right,
indeterminate although an underlying adenoma is possible. No
regional mass effect. No further imaging evaluation or follow-up is
necessary. Consider endocrine function tests and correlate for
history of pituitary hypersecretion. This follows ACR consensus
guidelines: Management of Incidental Pituitary Findings on CT, MRI
and F18-FDG PET: A White Paper of the ACR Incidental Findings
Committee. [HOSPITAL] [id number]; 15: 966-72.

## 2021-10-23 ENCOUNTER — Other Ambulatory Visit: Payer: Self-pay | Admitting: Student

## 2021-10-23 ENCOUNTER — Other Ambulatory Visit (HOSPITAL_COMMUNITY): Payer: Self-pay | Admitting: Student

## 2021-10-23 DIAGNOSIS — I639 Cerebral infarction, unspecified: Secondary | ICD-10-CM

## 2021-10-23 DIAGNOSIS — E236 Other disorders of pituitary gland: Secondary | ICD-10-CM

## 2021-10-26 ENCOUNTER — Ambulatory Visit: Payer: No Typology Code available for payment source | Admitting: Occupational Therapy

## 2021-10-26 NOTE — Progress Notes (Signed)
[place] loop recorder 

## 2021-10-28 ENCOUNTER — Ambulatory Visit: Payer: No Typology Code available for payment source

## 2021-11-02 ENCOUNTER — Ambulatory Visit: Payer: No Typology Code available for payment source | Admitting: Occupational Therapy

## 2021-11-02 ENCOUNTER — Ambulatory Visit
Admission: RE | Admit: 2021-11-02 | Discharge: 2021-11-02 | Disposition: A | Discharge status: Home / Self Care | Payer: No Typology Code available for payment source | Source: Ambulatory Visit | Attending: Student | Admitting: Student

## 2021-11-02 DIAGNOSIS — E236 Other disorders of pituitary gland: Secondary | ICD-10-CM

## 2021-11-02 DIAGNOSIS — I639 Cerebral infarction, unspecified: Secondary | ICD-10-CM | POA: Diagnosis present

## 2021-11-02 MED ORDER — GADOBUTROL 1 MMOL/ML IV SOLN
8.0000 mL | Freq: Once | INTRAVENOUS | Status: AC | PRN
  Administered 2021-11-02: 8 mL via INTRAVENOUS

## 2021-11-02 NOTE — Addendum Note (Signed)
Addended by: Douglass Rivers D on: 11/02/2021 03:57 PM   Modules accepted: Level of Service

## 2021-11-04 ENCOUNTER — Ambulatory Visit: Payer: No Typology Code available for payment source

## 2021-11-04 ENCOUNTER — Ambulatory Visit: Payer: No Typology Code available for payment source | Admitting: Occupational Therapy

## 2021-11-09 ENCOUNTER — Ambulatory Visit: Payer: No Typology Code available for payment source | Admitting: Occupational Therapy

## 2021-11-11 ENCOUNTER — Ambulatory Visit: Payer: No Typology Code available for payment source

## 2021-11-11 ENCOUNTER — Ambulatory Visit: Payer: No Typology Code available for payment source | Admitting: Occupational Therapy

## 2021-11-12 ENCOUNTER — Ambulatory Visit (INDEPENDENT_AMBULATORY_CARE_PROVIDER_SITE_OTHER): Payer: No Typology Code available for payment source

## 2021-11-12 DIAGNOSIS — I639 Cerebral infarction, unspecified: Secondary | ICD-10-CM

## 2021-11-12 LAB — CUP PACEART REMOTE DEVICE CHECK
Date Time Interrogation Session: 20231026025000
Implantable Pulse Generator Implant Date: 20230405
Pulse Gen Serial Number: 178997

## 2021-11-16 ENCOUNTER — Ambulatory Visit: Payer: No Typology Code available for payment source | Admitting: Occupational Therapy

## 2021-11-18 ENCOUNTER — Ambulatory Visit: Payer: No Typology Code available for payment source | Admitting: Occupational Therapy

## 2021-11-18 ENCOUNTER — Ambulatory Visit: Payer: No Typology Code available for payment source

## 2021-11-23 ENCOUNTER — Ambulatory Visit: Payer: No Typology Code available for payment source | Admitting: Occupational Therapy

## 2021-11-23 NOTE — Progress Notes (Signed)
[facility] Summary Report / Loop Recorder 

## 2021-11-25 ENCOUNTER — Ambulatory Visit: Payer: No Typology Code available for payment source | Admitting: Occupational Therapy

## 2021-11-25 ENCOUNTER — Ambulatory Visit: Payer: No Typology Code available for payment source

## 2021-11-30 ENCOUNTER — Ambulatory Visit: Payer: No Typology Code available for payment source | Admitting: Occupational Therapy

## 2021-12-02 ENCOUNTER — Ambulatory Visit: Payer: No Typology Code available for payment source | Admitting: Occupational Therapy

## 2021-12-02 ENCOUNTER — Ambulatory Visit: Payer: No Typology Code available for payment source

## 2021-12-07 ENCOUNTER — Ambulatory Visit: Payer: No Typology Code available for payment source | Admitting: Occupational Therapy

## 2021-12-09 ENCOUNTER — Ambulatory Visit: Payer: No Typology Code available for payment source | Admitting: Occupational Therapy

## 2021-12-09 ENCOUNTER — Ambulatory Visit: Payer: No Typology Code available for payment source

## 2021-12-14 ENCOUNTER — Ambulatory Visit (INDEPENDENT_AMBULATORY_CARE_PROVIDER_SITE_OTHER): Payer: No Typology Code available for payment source

## 2021-12-14 ENCOUNTER — Encounter: Payer: No Typology Code available for payment source | Admitting: Occupational Therapy

## 2021-12-14 DIAGNOSIS — I639 Cerebral infarction, unspecified: Secondary | ICD-10-CM | POA: Diagnosis not present

## 2021-12-15 LAB — CUP PACEART REMOTE DEVICE CHECK
Date Time Interrogation Session: 20231127024300
Implantable Pulse Generator Implant Date: 20230405
Pulse Gen Serial Number: 178997

## 2021-12-16 ENCOUNTER — Ambulatory Visit: Payer: No Typology Code available for payment source

## 2021-12-21 ENCOUNTER — Encounter: Payer: No Typology Code available for payment source | Admitting: Occupational Therapy

## 2021-12-23 ENCOUNTER — Ambulatory Visit: Payer: No Typology Code available for payment source

## 2021-12-28 ENCOUNTER — Encounter: Payer: No Typology Code available for payment source | Admitting: Occupational Therapy

## 2021-12-30 ENCOUNTER — Ambulatory Visit: Payer: No Typology Code available for payment source

## 2022-01-04 ENCOUNTER — Encounter: Payer: No Typology Code available for payment source | Admitting: Occupational Therapy

## 2022-01-06 ENCOUNTER — Ambulatory Visit: Payer: No Typology Code available for payment source

## 2022-01-13 ENCOUNTER — Ambulatory Visit: Payer: No Typology Code available for payment source

## 2022-01-13 ENCOUNTER — Encounter: Payer: No Typology Code available for payment source | Admitting: Occupational Therapy

## 2022-01-14 ENCOUNTER — Ambulatory Visit (INDEPENDENT_AMBULATORY_CARE_PROVIDER_SITE_OTHER): Payer: Self-pay

## 2022-01-14 DIAGNOSIS — I639 Cerebral infarction, unspecified: Secondary | ICD-10-CM

## 2022-01-19 LAB — CUP PACEART REMOTE DEVICE CHECK
Date Time Interrogation Session: 20240101020300
Implantable Pulse Generator Implant Date: 20230405
Pulse Gen Serial Number: 178997

## 2022-01-25 NOTE — Progress Notes (Signed)
[place] Loop Recorder  

## 2022-02-01 NOTE — Progress Notes (Signed)
[facility] Summary Report / Loop Recorder

## 2022-02-15 ENCOUNTER — Ambulatory Visit: Payer: Self-pay

## 2022-02-15 DIAGNOSIS — I639 Cerebral infarction, unspecified: Secondary | ICD-10-CM

## 2022-02-19 LAB — CUP PACEART REMOTE DEVICE CHECK
Date Time Interrogation Session: 20240201110000
Implantable Pulse Generator Implant Date: 20230405
Pulse Gen Serial Number: 178997

## 2022-03-18 ENCOUNTER — Ambulatory Visit: Payer: No Typology Code available for payment source

## 2022-03-18 DIAGNOSIS — I639 Cerebral infarction, unspecified: Secondary | ICD-10-CM

## 2022-03-19 LAB — CUP PACEART REMOTE DEVICE CHECK
Date Time Interrogation Session: 20240301003600
Implantable Pulse Generator Implant Date: 20230405
Pulse Gen Serial Number: 178997

## 2022-03-31 NOTE — Progress Notes (Signed)
[facility] Loop Recorder

## 2022-04-16 NOTE — Progress Notes (Signed)
[facility] Summary Report / Loop Recorder 

## 2022-04-19 ENCOUNTER — Ambulatory Visit: Payer: Self-pay

## 2022-04-19 DIAGNOSIS — I639 Cerebral infarction, unspecified: Secondary | ICD-10-CM

## 2022-04-20 LAB — CUP PACEART REMOTE DEVICE CHECK
Date Time Interrogation Session: 20240401002900
Implantable Pulse Generator Implant Date: 20230405
Pulse Gen Serial Number: 178997

## 2022-05-20 ENCOUNTER — Ambulatory Visit: Payer: No Typology Code available for payment source

## 2022-05-20 DIAGNOSIS — I639 Cerebral infarction, unspecified: Secondary | ICD-10-CM

## 2022-05-21 LAB — CUP PACEART REMOTE DEVICE CHECK
Date Time Interrogation Session: 20240503004900
Implantable Pulse Generator Implant Date: 20230405
Pulse Gen Serial Number: 178997

## 2022-05-26 NOTE — Progress Notes (Signed)
[facility]

## 2022-06-09 NOTE — Progress Notes (Signed)
[facility] Summary Report / Loop Recorder 

## 2022-06-21 ENCOUNTER — Ambulatory Visit (INDEPENDENT_AMBULATORY_CARE_PROVIDER_SITE_OTHER): Payer: Medicare PPO

## 2022-06-21 DIAGNOSIS — I639 Cerebral infarction, unspecified: Secondary | ICD-10-CM | POA: Diagnosis not present

## 2022-06-22 LAB — CUP PACEART REMOTE DEVICE CHECK
Date Time Interrogation Session: 20240603003100
Implantable Pulse Generator Implant Date: 20230405
Pulse Gen Serial Number: 178997

## 2022-07-13 NOTE — Progress Notes (Signed)
[place] LOOP RECORDER  

## 2022-07-23 ENCOUNTER — Ambulatory Visit (INDEPENDENT_AMBULATORY_CARE_PROVIDER_SITE_OTHER): Payer: Medicare PPO

## 2022-07-23 DIAGNOSIS — I639 Cerebral infarction, unspecified: Secondary | ICD-10-CM

## 2022-07-26 LAB — CUP PACEART REMOTE DEVICE CHECK
Date Time Interrogation Session: 20240708001900
Implantable Pulse Generator Implant Date: 20230405
Pulse Gen Serial Number: 178997

## 2022-08-11 NOTE — Progress Notes (Signed)
[facility] Summary Report / Loop Recorder 

## 2022-08-30 LAB — CUP PACEART REMOTE DEVICE CHECK
Date Time Interrogation Session: 20240812010000
Implantable Pulse Generator Implant Date: 20230405
Pulse Gen Serial Number: 178997

## 2022-08-31 ENCOUNTER — Ambulatory Visit (INDEPENDENT_AMBULATORY_CARE_PROVIDER_SITE_OTHER): Payer: Medicare PPO

## 2022-08-31 DIAGNOSIS — I639 Cerebral infarction, unspecified: Secondary | ICD-10-CM

## 2022-09-15 NOTE — Progress Notes (Signed)
[place] Loop Recorder 

## 2022-10-04 ENCOUNTER — Ambulatory Visit (INDEPENDENT_AMBULATORY_CARE_PROVIDER_SITE_OTHER): Payer: Medicare PPO

## 2022-10-04 DIAGNOSIS — I639 Cerebral infarction, unspecified: Secondary | ICD-10-CM | POA: Diagnosis not present

## 2022-10-05 ENCOUNTER — Telehealth: Payer: Self-pay

## 2022-10-05 LAB — CUP PACEART REMOTE DEVICE CHECK
Date Time Interrogation Session: 20240916002500
Implantable Pulse Generator Implant Date: 20230405
Pulse Gen Serial Number: 178997

## 2022-10-05 NOTE — Telephone Encounter (Signed)
Following alert received from [employer] received for 25 Symptom events, symptoms include heart racing, fluttering, light headed and fainted with variable levels of activity. Available ECGs c/w SR. Routed to Triage for review due to symptom of fainted on 09/28/22 at 09:58 (ECG available) and 09/27/22 at 09:28 (no ECG available).   Syncope event EGM reviewed and appears SR. No pause/brady events noted during time. Attempted to contact patient to follow up, no answer. Unable to leave VM d/t not set up.

## 2022-10-07 NOTE — Telephone Encounter (Signed)
Patient called, reports intermittent dizziness for a second or two then goes away. Pt denies syncope.  Reports dizziness mainly with position change. Patient checks her BP daily, reading this am was 117/78. Reports compliance with amlodipine 2.5 mg daily and Toprol- XL 25 daily. Patient advised we will call back with any further recommendations.   Routing to Dr. Lalla Brothers to advise further.

## 2022-10-11 NOTE — Telephone Encounter (Signed)
Attempted to call patient to to advise f/u with PCP. No answer, unable to leave VM.

## 2022-10-12 NOTE — Telephone Encounter (Signed)
Patient called advised to follow up with PCP. Voiced understanding and appreciative of call back.

## 2022-10-18 NOTE — Progress Notes (Signed)
[place] Loop Recorder 

## 2022-11-08 ENCOUNTER — Ambulatory Visit: Payer: Medicare PPO

## 2022-11-08 DIAGNOSIS — I639 Cerebral infarction, unspecified: Secondary | ICD-10-CM | POA: Diagnosis not present

## 2022-11-09 LAB — CUP PACEART REMOTE DEVICE CHECK
Date Time Interrogation Session: 20241021002200
Implantable Pulse Generator Implant Date: 20230405
Pulse Gen Serial Number: 178997

## 2022-11-24 NOTE — Progress Notes (Signed)
[place] Loop Recorder 

## 2022-12-13 ENCOUNTER — Ambulatory Visit (INDEPENDENT_AMBULATORY_CARE_PROVIDER_SITE_OTHER): Payer: Medicare PPO

## 2022-12-13 DIAGNOSIS — I639 Cerebral infarction, unspecified: Secondary | ICD-10-CM | POA: Diagnosis not present

## 2022-12-13 LAB — CUP PACEART REMOTE DEVICE CHECK
Date Time Interrogation Session: 20241125002400
Implantable Pulse Generator Implant Date: 20230405
Pulse Gen Serial Number: 178997

## 2023-01-10 NOTE — Progress Notes (Signed)
[place] Loop Recorder 

## 2023-01-17 ENCOUNTER — Ambulatory Visit (INDEPENDENT_AMBULATORY_CARE_PROVIDER_SITE_OTHER): Payer: Medicare PPO

## 2023-01-17 DIAGNOSIS — I639 Cerebral infarction, unspecified: Secondary | ICD-10-CM | POA: Diagnosis not present

## 2023-01-18 LAB — CUP PACEART REMOTE DEVICE CHECK
Date Time Interrogation Session: 20241230004200
Implantable Pulse Generator Implant Date: 20230405
Pulse Gen Serial Number: 178997

## 2023-02-21 ENCOUNTER — Ambulatory Visit (INDEPENDENT_AMBULATORY_CARE_PROVIDER_SITE_OTHER): Payer: Medicare PPO

## 2023-02-21 DIAGNOSIS — I639 Cerebral infarction, unspecified: Secondary | ICD-10-CM | POA: Diagnosis not present

## 2023-02-21 LAB — CUP PACEART REMOTE DEVICE CHECK
Date Time Interrogation Session: 20250203001900
Implantable Pulse Generator Implant Date: 20230405
Pulse Gen Serial Number: 178997

## 2023-02-26 ENCOUNTER — Encounter: Payer: Self-pay | Admitting: Cardiology

## 2023-03-28 ENCOUNTER — Ambulatory Visit (INDEPENDENT_AMBULATORY_CARE_PROVIDER_SITE_OTHER): Payer: Medicare PPO

## 2023-03-28 DIAGNOSIS — I639 Cerebral infarction, unspecified: Secondary | ICD-10-CM

## 2023-03-29 LAB — CUP PACEART REMOTE DEVICE CHECK
Date Time Interrogation Session: 20250310011200
Implantable Pulse Generator Implant Date: 20230405
Pulse Gen Serial Number: 178997

## 2023-03-30 NOTE — Progress Notes (Signed)
 [facility] Loop Recorder

## 2023-03-31 ENCOUNTER — Encounter: Payer: Self-pay | Admitting: Cardiology

## 2023-05-02 ENCOUNTER — Ambulatory Visit (INDEPENDENT_AMBULATORY_CARE_PROVIDER_SITE_OTHER): Payer: Medicare PPO

## 2023-05-02 DIAGNOSIS — I639 Cerebral infarction, unspecified: Secondary | ICD-10-CM | POA: Diagnosis not present

## 2023-05-02 NOTE — Addendum Note (Signed)
 Addended by: Edra Govern D on: 05/02/2023 10:44 AM   Modules accepted: Orders

## 2023-05-02 NOTE — Progress Notes (Signed)
 [facility] Loop Recorder

## 2023-05-03 ENCOUNTER — Encounter: Payer: Self-pay | Admitting: Cardiology

## 2023-05-03 LAB — CUP PACEART REMOTE DEVICE CHECK
Date Time Interrogation Session: 20250414004100
Implantable Pulse Generator Implant Date: 20230405
Pulse Gen Serial Number: 178997

## 2023-06-06 ENCOUNTER — Ambulatory Visit (INDEPENDENT_AMBULATORY_CARE_PROVIDER_SITE_OTHER): Payer: Medicare PPO

## 2023-06-06 DIAGNOSIS — I639 Cerebral infarction, unspecified: Secondary | ICD-10-CM

## 2023-06-07 ENCOUNTER — Ambulatory Visit: Payer: Self-pay | Admitting: Cardiology

## 2023-06-07 LAB — CUP PACEART REMOTE DEVICE CHECK
Date Time Interrogation Session: 20250520130200
Implantable Pulse Generator Implant Date: 20230405
Pulse Gen Serial Number: 178997

## 2023-06-20 NOTE — Progress Notes (Signed)
 [facility] Loop Recorder

## 2023-06-20 NOTE — Addendum Note (Signed)
 Addended by: Edra Govern D on: 06/20/2023 05:44 PM   Modules accepted: Orders

## 2023-07-11 ENCOUNTER — Ambulatory Visit: Payer: Self-pay | Admitting: Cardiology

## 2023-07-11 ENCOUNTER — Ambulatory Visit (INDEPENDENT_AMBULATORY_CARE_PROVIDER_SITE_OTHER): Payer: Medicare PPO

## 2023-07-11 DIAGNOSIS — I639 Cerebral infarction, unspecified: Secondary | ICD-10-CM

## 2023-07-11 LAB — CUP PACEART REMOTE DEVICE CHECK
Date Time Interrogation Session: 20250623010600
Implantable Pulse Generator Implant Date: 20230405
Pulse Gen Serial Number: 178997

## 2023-07-25 NOTE — Addendum Note (Signed)
 Addended by: TAWNI DRILLING D on: 07/25/2023 03:25 PM   Modules accepted: Orders

## 2023-07-25 NOTE — Progress Notes (Signed)
 [facility] Loop Recorder

## 2023-08-11 ENCOUNTER — Ambulatory Visit (INDEPENDENT_AMBULATORY_CARE_PROVIDER_SITE_OTHER): Payer: Self-pay

## 2023-08-11 ENCOUNTER — Ambulatory Visit: Payer: Self-pay | Admitting: Cardiology

## 2023-08-11 DIAGNOSIS — I639 Cerebral infarction, unspecified: Secondary | ICD-10-CM

## 2023-08-11 LAB — CUP PACEART REMOTE DEVICE CHECK
Date Time Interrogation Session: 20250724010000
Implantable Pulse Generator Implant Date: 20230405
Pulse Gen Serial Number: 178997

## 2023-08-15 NOTE — Progress Notes (Signed)
 [facility] Loop Recorder

## 2023-09-12 ENCOUNTER — Ambulatory Visit (INDEPENDENT_AMBULATORY_CARE_PROVIDER_SITE_OTHER): Payer: Self-pay

## 2023-09-12 DIAGNOSIS — I639 Cerebral infarction, unspecified: Secondary | ICD-10-CM

## 2023-09-13 LAB — CUP PACEART REMOTE DEVICE CHECK
Date Time Interrogation Session: 20250825004900
Implantable Pulse Generator Implant Date: 20230405
Pulse Gen Serial Number: 178997

## 2023-09-14 ENCOUNTER — Ambulatory Visit: Payer: Self-pay | Admitting: Cardiology

## 2023-10-05 NOTE — Progress Notes (Signed)
 Remote Loop Recorder Transmission

## 2023-10-13 ENCOUNTER — Ambulatory Visit: Payer: Self-pay

## 2023-10-13 DIAGNOSIS — I639 Cerebral infarction, unspecified: Secondary | ICD-10-CM | POA: Diagnosis not present

## 2023-10-13 LAB — CUP PACEART REMOTE DEVICE CHECK
Date Time Interrogation Session: 20250925002100
Implantable Pulse Generator Implant Date: 20230405
Pulse Gen Serial Number: 178997

## 2023-10-19 NOTE — Progress Notes (Signed)
 Remote Loop Recorder Transmission

## 2023-10-20 NOTE — Progress Notes (Signed)
 Remote Loop Recorder Transmission

## 2023-10-21 ENCOUNTER — Ambulatory Visit: Payer: Self-pay | Admitting: Cardiology

## 2023-11-14 ENCOUNTER — Ambulatory Visit (INDEPENDENT_AMBULATORY_CARE_PROVIDER_SITE_OTHER): Payer: Self-pay

## 2023-11-14 DIAGNOSIS — I639 Cerebral infarction, unspecified: Secondary | ICD-10-CM

## 2023-11-14 LAB — CUP PACEART REMOTE DEVICE CHECK
Date Time Interrogation Session: 20251027003300
Implantable Pulse Generator Implant Date: 20230405
Pulse Gen Serial Number: 178997

## 2023-11-16 ENCOUNTER — Ambulatory Visit: Payer: Self-pay | Admitting: Cardiology

## 2023-11-16 ENCOUNTER — Ambulatory Visit: Attending: Cardiology | Admitting: Cardiology

## 2023-11-16 ENCOUNTER — Encounter: Payer: Self-pay | Admitting: Cardiology

## 2023-11-16 VITALS — BP 130/70 | HR 75 | Ht 64.0 in | Wt 202.4 lb

## 2023-11-16 DIAGNOSIS — I639 Cerebral infarction, unspecified: Secondary | ICD-10-CM | POA: Diagnosis not present

## 2023-11-16 DIAGNOSIS — I10 Essential (primary) hypertension: Secondary | ICD-10-CM | POA: Diagnosis not present

## 2023-11-16 NOTE — Patient Instructions (Signed)
 Medication Instructions:  Your physician recommends that you continue on your current medications as directed. Please refer to the Current Medication list given to you today.  *If you need a refill on your cardiac medications before your next appointment, please call your pharmacy*  Follow-Up: At [facility], you and your health needs are our priority.  As part of our continuing mission to provide you with exceptional heart care, our providers are all part of one team.  This team includes your primary Cardiologist (physician) and Advanced Practice Providers or APPs (Physician Assistants and Nurse Practitioners) who all work together to provide you with the care you need, when you need it.  Your next appointment:   As needed with EP

## 2023-11-16 NOTE — Progress Notes (Signed)
  Electrophysiology Office Follow up Visit Note:    Date:  11/16/2023   ID:  Kim Gomez, DOB 08/22/1955, MRN [id number]  PCP:  Kim Bail, MD  [facility] Cardiologist:  None  [facility] Electrophysiologist:  Kim ONEIDA HOLTS, MD    Interval History:     Kim Gomez is a 68 y.o. female who presents for a follow up visit.   The patient regularly follows with Kim Gomez. I last saw the patient April 22, 2021.  She has a loop recorder after having a stroke in 2022. Loop recorder monitoring has shown many symptom triggered episodes that correspond to sinus rhythm. Today she is doing well.  No complaints.  Her heart rhythm has been stable.      Past medical, surgical, social and family history were reviewed.  ROS:   Please see the history of present illness.    All other systems reviewed and are negative.  EKGs/Labs/Other Studies Reviewed:    The following studies were reviewed today:          Physical Exam:    VS:  BP 130/70 (BP Location: Left Arm, Patient Position: Sitting, Cuff Size: Normal)   Pulse 75   Ht 5' 4 (1.626 m)   Wt 202 lb 6 oz (91.8 kg)   SpO2 98%   BMI 34.74 kg/m     Wt Readings from Last 3 Encounters:  11/16/23 202 lb 6 oz (91.8 kg)  04/22/21 197 lb (89.4 kg)  08/10/20 175 lb (79.4 kg)     GEN: no distress CARD: RRR, No MRG RESP: No IWOB. CTAB.      ASSESSMENT:    1. Cryptogenic stroke (HCC)   2. Primary hypertension    PLAN:    In order of problems listed above:  #Cryptogenic stroke Loop recorder monitoring has revealed no atrial fibrillation. Continue aspirin  and statin. Good blood pressure control  #Hypertension At goal today.  Recommend checking blood pressures 1-2 times per week at home and recording the values.  Recommend bringing these recordings to the primary care physician.  I discussed my upcoming departure from Kim Gomez during today's clinic appointment.  She will continue to follow-up  as needed with my partner, Kim Gomez.  Follow-up with EP on an as-needed basis  Signed, Kim Holts, MD, Kim Gomez, Kim Gomez 11/16/2023 2:44 PM    Electrophysiology [facility]

## 2023-11-22 NOTE — Progress Notes (Signed)
 Remote Loop Recorder Transmission

## 2023-12-15 ENCOUNTER — Ambulatory Visit: Payer: Self-pay

## 2023-12-15 DIAGNOSIS — I639 Cerebral infarction, unspecified: Secondary | ICD-10-CM

## 2023-12-16 LAB — CUP PACEART REMOTE DEVICE CHECK
Date Time Interrogation Session: 20251127030200
Implantable Pulse Generator Implant Date: 20230405
Pulse Gen Serial Number: 178997

## 2023-12-20 NOTE — Progress Notes (Signed)
 Remote Loop Recorder Transmission

## 2024-01-16 ENCOUNTER — Ambulatory Visit: Payer: Self-pay | Admitting: Cardiology

## 2024-01-16 DIAGNOSIS — I639 Cerebral infarction, unspecified: Secondary | ICD-10-CM

## 2024-01-16 LAB — CUP PACEART REMOTE DEVICE CHECK
Date Time Interrogation Session: 20251228002000
Implantable Pulse Generator Implant Date: 20230405
Pulse Gen Serial Number: 178997

## 2024-01-22 ENCOUNTER — Ambulatory Visit: Payer: Self-pay | Admitting: Cardiology

## 2024-01-24 NOTE — Progress Notes (Signed)
 Remote Loop Recorder Transmission

## 2024-02-16 ENCOUNTER — Ambulatory Visit: Payer: Self-pay

## 2024-02-16 DIAGNOSIS — I639 Cerebral infarction, unspecified: Secondary | ICD-10-CM

## 2024-02-16 LAB — CUP PACEART REMOTE DEVICE CHECK
Date Time Interrogation Session: 20260129010300
Implantable Pulse Generator Implant Date: 20230405
Pulse Gen Serial Number: 178997

## 2024-02-24 NOTE — Progress Notes (Signed)
 Remote Loop Recorder Transmission
